# Patient Record
Sex: Male | Born: 1970 | Race: Black or African American | Hispanic: No | Marital: Married | Smoking: Former smoker
Health system: Southern US, Community
[De-identification: ages and names within clinical notes are randomized; demographics above are authoritative.]

## PROBLEM LIST (undated history)

## (undated) DIAGNOSIS — K859 Acute pancreatitis without necrosis or infection, unspecified: Secondary | ICD-10-CM

## (undated) DIAGNOSIS — I10 Essential (primary) hypertension: Secondary | ICD-10-CM

## (undated) HISTORY — PX: NO PAST SURGERIES: SHX2092

---

## 2006-08-20 ENCOUNTER — Emergency Department (HOSPITAL_COMMUNITY): Admission: EM | Admit: 2006-08-20 | Discharge: 2006-08-20 | Payer: Self-pay | Admitting: Emergency Medicine

## 2009-04-13 ENCOUNTER — Emergency Department (HOSPITAL_COMMUNITY): Admission: EM | Admit: 2009-04-13 | Discharge: 2009-04-13 | Payer: Self-pay | Admitting: Emergency Medicine

## 2011-10-27 ENCOUNTER — Encounter: Payer: Self-pay | Admitting: Cardiology

## 2011-10-27 ENCOUNTER — Emergency Department (INDEPENDENT_AMBULATORY_CARE_PROVIDER_SITE_OTHER)
Admission: EM | Admit: 2011-10-27 | Discharge: 2011-10-27 | Disposition: A | Discharge status: Home / Self Care | Payer: Self-pay | Source: Home / Self Care | Attending: Family Medicine | Admitting: Family Medicine

## 2011-10-27 DIAGNOSIS — A64 Unspecified sexually transmitted disease: Secondary | ICD-10-CM

## 2011-10-27 HISTORY — DX: Essential (primary) hypertension: I10

## 2011-10-27 LAB — POCT URINALYSIS DIP (DEVICE)
Ketones, ur: NEGATIVE mg/dL
Leukocytes, UA: NEGATIVE
Protein, ur: 30 mg/dL — AB

## 2011-10-27 MED ORDER — METRONIDAZOLE 500 MG PO TABS
2000.0000 mg | ORAL_TABLET | Freq: Once | ORAL | Status: AC
  Administered 2011-10-27: 2000 mg via ORAL

## 2011-10-27 MED ORDER — METRONIDAZOLE 500 MG PO TABS
ORAL_TABLET | ORAL | Status: AC
  Filled 2011-10-27: qty 1

## 2011-10-27 MED ORDER — METRONIDAZOLE 500 MG PO TABS
ORAL_TABLET | ORAL | Status: AC
  Filled 2011-10-27: qty 3

## 2011-10-27 NOTE — ED Notes (Signed)
Pt possibly exposed to STD trichomonas. Pt denies burning on urination.  Has some itching in the genital area.  Denies penile discharge.

## 2011-10-27 NOTE — ED Provider Notes (Signed)
History     CSN: [id number] Arrival date & time: 10/27/2011  8:40 AM   First MD Initiated Contact with Patient 10/27/11 [phone]      Chief Complaint  Patient presents with  . Exposure to STD    possible tric    (Consider location/radiation/quality/duration/timing/severity/associated sxs/prior treatment) HPI Comments: Max Ayers presents for evaluation and treatment of exposure to trichomonas. He reports that his sexual partner was recently diagnosed and treated for trichomonas. He denies any symptoms, no penile discharge. He denies any abdominal pain or fever. They do not use protection.   Patient is a 40 y.o. male presenting with STD exposure. The history is provided by the patient.  Exposure to STD This is a new problem. The problem occurs constantly.    Past Medical History  Diagnosis Date  . Hypertension     History reviewed. No pertinent past surgical history.  Family History  Problem Relation Age of Onset  . Diabetes Mother   . Hypertension Father     History  Substance Use Topics  . Smoking status: Current Everyday Smoker -- 1.0 packs/day    Types: Cigarettes  . Smokeless tobacco: Not on file  . Alcohol Use: Yes     social      Review of Systems  Constitutional: Negative.   HENT: Negative.   Eyes: Negative.   Respiratory: Negative.   Gastrointestinal: Negative.   Genitourinary: Negative for dysuria, urgency, frequency, hematuria, flank pain and discharge.  Musculoskeletal: Negative.     Allergies  Beeswax  Home Medications  No current outpatient prescriptions on file.  BP 128/81  Pulse 95  Temp(Src) 98.3 F (36.8 C) (Oral)  Resp 20  SpO2 100%  Physical Exam  Constitutional: He is oriented to person, place, and time. He appears well-developed and well-nourished.  HENT:  Head: Normocephalic and atraumatic.  Eyes: EOM are normal.  Neck: Normal range of motion.  Neurological: He is alert and oriented to person, place, and time.  Skin: Skin is  warm and dry.    ED Course  Procedures (including critical care time)  Labs Reviewed - No data to display No results found.   No diagnosis found.    MDM          Richardo Priest, MD 10/27/11 1009

## 2011-10-28 LAB — GC/CHLAMYDIA PROBE AMP, URINE
Chlamydia, Swab/Urine, PCR: NEGATIVE
GC Probe Amp, Urine: NEGATIVE

## 2015-02-23 ENCOUNTER — Emergency Department (HOSPITAL_COMMUNITY)
Admission: EM | Admit: 2015-02-23 | Discharge: 2015-02-23 | Disposition: A | Discharge status: Home / Self Care | Payer: Managed Care, Other (non HMO) | Attending: Emergency Medicine | Admitting: Emergency Medicine

## 2015-02-23 ENCOUNTER — Encounter (HOSPITAL_COMMUNITY): Payer: Self-pay | Admitting: Emergency Medicine

## 2015-02-23 DIAGNOSIS — R21 Rash and other nonspecific skin eruption: Secondary | ICD-10-CM | POA: Diagnosis present

## 2015-02-23 DIAGNOSIS — I10 Essential (primary) hypertension: Secondary | ICD-10-CM | POA: Insufficient documentation

## 2015-02-23 DIAGNOSIS — Z720 Tobacco use: Secondary | ICD-10-CM | POA: Insufficient documentation

## 2015-02-23 DIAGNOSIS — L03114 Cellulitis of left upper limb: Secondary | ICD-10-CM | POA: Diagnosis not present

## 2015-02-23 LAB — CBG MONITORING, ED: Glucose-Capillary: 117 mg/dL — ABNORMAL HIGH (ref 70–99)

## 2015-02-23 MED ORDER — LIDOCAINE HCL 1 % IJ SOLN
INTRAMUSCULAR | Status: AC
  Administered 2015-02-23: 2 mL
  Filled 2015-02-23: qty 20

## 2015-02-23 MED ORDER — CEFTRIAXONE SODIUM 1 G IJ SOLR
1.0000 g | Freq: Once | INTRAMUSCULAR | Status: AC
  Administered 2015-02-23: 1 g via INTRAMUSCULAR
  Filled 2015-02-23: qty 10

## 2015-02-23 MED ORDER — SULFAMETHOXAZOLE-TRIMETHOPRIM 800-160 MG PO TABS: 2.0000 | ORAL_TABLET | Freq: Two times a day (BID) | ORAL | Status: DC

## 2015-02-23 MED ORDER — CEPHALEXIN 500 MG PO CAPS: 500.0000 mg | ORAL_CAPSULE | Freq: Four times a day (QID) | ORAL | Status: DC

## 2015-02-23 NOTE — ED Notes (Signed)
Per pt, states he thought he got bit by bug, noticed raised area on inside of left forearm-now clusters of bumps, raised and red

## 2015-02-23 NOTE — ED Provider Notes (Signed)
CSN: [id number]     Arrival date & time 02/23/15  0815 History   First MD Initiated Contact with Patient 02/23/15 [phone]     Chief Complaint  Patient presents with  . Rash     (Consider location/radiation/quality/duration/timing/severity/associated sxs/prior Treatment) HPI Comments: Patient presents today with a rash to the left forearm.  He states that he may have felt an insect bite him, but he is unsure.  Rash has been present for the past 2 days.  He reports that yesterday he noticed some redness and swelling of the left arm surrounding the rash.  He has not taken anything for symptoms prior to arrival.  He reports that the rash itches and burns.  No drainage from the area.  He denies any new soaps, detergents, lotions, or medications.  He denies fever, chills, nausea, or vomiting.  No SOB.  No swelling of the lips, tongue, or throat.  No history of DM.   Past Medical History  Diagnosis Date  . Hypertension    History reviewed. No pertinent past surgical history. Family History  Problem Relation Age of Onset  . Diabetes Mother   . Hypertension Father    History  Substance Use Topics  . Smoking status: Current Every Day Smoker -- 1.00 packs/day    Types: Cigarettes  . Smokeless tobacco: Not on file  . Alcohol Use: Yes     Comment: social    Review of Systems  All other systems reviewed and are negative.     Allergies  Beeswax  Home Medications   Prior to Admission medications   Not on File   BP 160/90 mmHg  Pulse 99  Temp(Src) 98 F (36.7 C) (Oral)  Resp 18  SpO2 100% Physical Exam  Constitutional: He appears well-developed and well-nourished.  HENT:  Head: Normocephalic and atraumatic.  Mouth/Throat: Oropharynx is clear and moist.  Airway patent No swelling of the lips, tongue, or throat  Neck: Normal range of motion. Neck supple.  Cardiovascular: Normal rate, regular rhythm and normal heart sounds.   Pulses:      Radial pulses are 2+ on the right side,  and 2+ on the left side.  Pulmonary/Chest: Effort normal and breath sounds normal.  Musculoskeletal: Normal range of motion.  Full ROM of the left elbow and left wrist  Neurological: He is alert.  Distal sensation of the left hand intact  Skin: Skin is warm and dry.     Psychiatric: He has a normal mood and affect.  Nursing note and vitals reviewed.   ED Course  Procedures (including critical care time) Labs Review Labs Reviewed  CBG MONITORING, ED    Imaging Review No results found.   EKG Interpretation None      MDM   Final diagnoses:  None   Patient presents today with a rash of the left forearm.  He also has some surrounding erythema, edema, and warmth consistent with Cellulitis.  Patient also evaluated by Dr. Roderic Palau.  Patient given 1 gram Rocephin IM in the ED and discharged home with antibiotics.  No history of DM.  CBG 116 in the ED.  Patient stable for discharge.  Instructed him to come back to the ED or Urgent Care in 2 days for recheck.  Return precautions given.    Hyman Bible, PA-C 02/24/15 2325  Milton Ferguson, MD 02/25/15 [phone]

## 2020-08-16 ENCOUNTER — Other Ambulatory Visit: Payer: Self-pay

## 2020-08-16 ENCOUNTER — Emergency Department (HOSPITAL_BASED_OUTPATIENT_CLINIC_OR_DEPARTMENT_OTHER): Payer: Managed Care, Other (non HMO)

## 2020-08-16 ENCOUNTER — Emergency Department (HOSPITAL_BASED_OUTPATIENT_CLINIC_OR_DEPARTMENT_OTHER)
Admission: EM | Admit: 2020-08-16 | Discharge: 2020-08-17 | Disposition: A | Discharge status: Home / Self Care | Payer: Managed Care, Other (non HMO) | Attending: Emergency Medicine | Admitting: Emergency Medicine

## 2020-08-16 ENCOUNTER — Encounter (HOSPITAL_BASED_OUTPATIENT_CLINIC_OR_DEPARTMENT_OTHER): Payer: Self-pay | Admitting: Emergency Medicine

## 2020-08-16 DIAGNOSIS — Z79899 Other long term (current) drug therapy: Secondary | ICD-10-CM | POA: Insufficient documentation

## 2020-08-16 DIAGNOSIS — R1013 Epigastric pain: Secondary | ICD-10-CM | POA: Insufficient documentation

## 2020-08-16 DIAGNOSIS — F17210 Nicotine dependence, cigarettes, uncomplicated: Secondary | ICD-10-CM | POA: Insufficient documentation

## 2020-08-16 DIAGNOSIS — I10 Essential (primary) hypertension: Secondary | ICD-10-CM | POA: Insufficient documentation

## 2020-08-16 DIAGNOSIS — K859 Acute pancreatitis without necrosis or infection, unspecified: Secondary | ICD-10-CM

## 2020-08-16 LAB — COMPREHENSIVE METABOLIC PANEL
ALT: 17 U/L (ref 0–44)
AST: 22 U/L (ref 15–41)
Albumin: 3.7 g/dL (ref 3.5–5.0)
Alkaline Phosphatase: 63 U/L (ref 38–126)
Anion gap: 11 (ref 5–15)
BUN: 14 mg/dL (ref 6–20)
CO2: 26 mmol/L (ref 22–32)
Calcium: 9 mg/dL (ref 8.9–10.3)
Chloride: 104 mmol/L (ref 98–111)
Creatinine, Ser: 1.02 mg/dL (ref 0.61–1.24)
GFR calc Af Amer: >60 mL/min (ref >60)
GFR calc non Af Amer: >60 mL/min (ref >60)
Glucose, Bld: 103 mg/dL — ABNORMAL HIGH (ref 70–99)
Potassium: 3.5 mmol/L (ref 3.5–5.1)
Sodium: 141 mmol/L (ref 135–145)
Total Bilirubin: 0.5 mg/dL (ref 0.3–1.2)
Total Protein: 7.3 g/dL (ref 6.5–8.1)

## 2020-08-16 LAB — URINALYSIS, ROUTINE W REFLEX MICROSCOPIC
Bilirubin Urine: NEGATIVE
Glucose, UA: NEGATIVE mg/dL
Hgb urine dipstick: NEGATIVE
Ketones, ur: NEGATIVE mg/dL
Leukocytes,Ua: NEGATIVE
Nitrite: NEGATIVE
Protein, ur: NEGATIVE mg/dL
Specific Gravity, Urine: 1.015 (ref 1.005–1.030)
pH: 8 (ref 5.0–8.0)

## 2020-08-16 LAB — CBC
HCT: 43.3 % (ref 39.0–52.0)
Hemoglobin: 14.7 g/dL (ref 13.0–17.0)
MCH: 31.9 pg (ref 26.0–34.0)
MCHC: 33.9 g/dL (ref 30.0–36.0)
MCV: 93.9 fL (ref 80.0–100.0)
Platelets: 147 10*3/uL — ABNORMAL LOW (ref 150–400)
RBC: 4.61 MIL/uL (ref 4.22–5.81)
RDW: 13.2 % (ref 11.5–15.5)
WBC: 5 10*3/uL (ref 4.0–10.5)
nRBC: 0 % (ref 0.0–0.2)

## 2020-08-16 LAB — LIPASE, BLOOD: Lipase: 103 U/L — ABNORMAL HIGH (ref 11–51)

## 2020-08-16 LAB — TROPONIN I (HIGH SENSITIVITY): Troponin I (High Sensitivity): 8 ng/L (ref <18)

## 2020-08-16 MED ORDER — LIDOCAINE VISCOUS HCL 2 % MT SOLN
15.0000 mL | Freq: Once | OROMUCOSAL | Status: AC
  Administered 2020-08-17: 15 mL via ORAL
  Filled 2020-08-16: qty 15

## 2020-08-16 MED ORDER — FENTANYL CITRATE (PF) 100 MCG/2ML IJ SOLN
50.0000 ug | INTRAMUSCULAR | Status: DC | PRN
  Administered 2020-08-16: 50 ug via INTRAVENOUS
  Filled 2020-08-16: qty 2

## 2020-08-16 MED ORDER — ONDANSETRON HCL 4 MG/2ML IJ SOLN
4.0000 mg | Freq: Once | INTRAMUSCULAR | Status: AC | PRN
  Administered 2020-08-16: 4 mg via INTRAVENOUS
  Filled 2020-08-16: qty 2

## 2020-08-16 MED ORDER — ALUM & MAG HYDROXIDE-SIMETH 200-200-20 MG/5ML PO SUSP
30.0000 mL | Freq: Once | ORAL | Status: AC
  Administered 2020-08-17: 30 mL via ORAL
  Filled 2020-08-16: qty 30

## 2020-08-16 MED ORDER — KETOROLAC TROMETHAMINE 30 MG/ML IJ SOLN
30.0000 mg | Freq: Once | INTRAMUSCULAR | Status: AC
  Administered 2020-08-17: 30 mg via INTRAVENOUS
  Filled 2020-08-16: qty 1

## 2020-08-16 MED ORDER — IOHEXOL 300 MG/ML  SOLN
100.0000 mL | Freq: Once | INTRAMUSCULAR | Status: AC | PRN
  Administered 2020-08-16: 100 mL via INTRAVENOUS

## 2020-08-16 NOTE — ED Triage Notes (Signed)
Reports RUQ pain that was radiating across the top of the abdomen and around to upper right back. This started on Thursday.  Now pain is in RUQ and radiating into the back only.  Denies n/v/d. Had an episode with the same thing a  Month ago that went away on its own.  Reports it is worse after eating.

## 2020-08-17 ENCOUNTER — Encounter (HOSPITAL_BASED_OUTPATIENT_CLINIC_OR_DEPARTMENT_OTHER): Payer: Self-pay | Admitting: Emergency Medicine

## 2020-08-17 MED ORDER — OMEPRAZOLE 20 MG PO CPDR: 20.0000 mg | DELAYED_RELEASE_CAPSULE | Freq: Every day | ORAL | 0 refills | Status: DC

## 2020-08-17 MED ORDER — TRAMADOL HCL 50 MG PO TABS: 50.0000 mg | ORAL_TABLET | Freq: Four times a day (QID) | ORAL | 0 refills | Status: DC | PRN

## 2020-08-17 NOTE — ED Provider Notes (Signed)
[facility] EMERGENCY DEPARTMENT Provider Note   CSN: [id number] Arrival date & time: 08/16/20  1825     History Chief Complaint  Patient presents with  . Abdominal Pain    Max Ayers is a 49 y.o. male.  The history is provided by the patient.  Abdominal Pain Pain location:  Epigastric Pain quality: aching   Pain radiation: r flank  Pain severity:  Moderate Onset quality:  Gradual Timing:  Constant Progression:  Unchanged Chronicity:  New Context: not alcohol use   Relieved by:  Nothing Worsened by:  Nothing Ineffective treatments:  None tried Associated symptoms: no anorexia, no chest pain, no fever and no shortness of breath   1 week of epigastric pain, had this in the past and it returned.  No f/c/r. No n/v/d.       Past Medical History:  Diagnosis Date  . Hypertension     There are no problems to display for this patient.   History reviewed. No pertinent surgical history.     Family History  Problem Relation Age of Onset  . Diabetes Mother   . Hypertension Father     Social History   Tobacco Use  . Smoking status: Current Every Day Smoker    Packs/day: 1.00    Types: Cigarettes  . Smokeless tobacco: Never Used  Substance Use Topics  . Alcohol use: Yes    Comment: daily  . Drug use: No    Home Medications Prior to Admission medications   Medication Sig Start Date End Date Taking? Authorizing Provider  cephALEXin (KEFLEX) 500 MG capsule Take 1 capsule (500 mg total) by mouth 4 (four) times daily. 02/23/15   Hyman Bible, PA-C  sulfamethoxazole-trimethoprim (SEPTRA DS) 800-160 MG per tablet Take 2 tablets by mouth every 12 (twelve) hours. 02/23/15   Hyman Bible, PA-C    Allergies    Yellow jacket venom [bee venom] and Beeswax  Review of Systems   Review of Systems  Constitutional: Negative for fever.  HENT: Negative for congestion.   Eyes: Negative for visual disturbance.  Respiratory: Negative for shortness of  breath.   Cardiovascular: Negative for chest pain.  Gastrointestinal: Positive for abdominal pain. Negative for anorexia.  Genitourinary: Negative for difficulty urinating.  Musculoskeletal: Negative for neck pain.  Skin: Negative for rash.  Neurological: Negative for dizziness.  Psychiatric/Behavioral: Negative for agitation.  All other systems reviewed and are negative.   Physical Exam Updated Vital Signs BP (!) 165/81 (BP Location: Right Arm)   Pulse 67   Temp 98.8 F (37.1 C) (Oral)   Resp 20   Ht 5\' 11"  (1.803 m)   Wt 89.8 kg   SpO2 99%   BMI 27.62 kg/m   Physical Exam Vitals and nursing note reviewed.  Constitutional:      General: He is not in acute distress.    Appearance: Normal appearance.  HENT:     Head: Normocephalic and atraumatic.     Nose: Nose normal.  Eyes:     Conjunctiva/sclera: Conjunctivae normal.     Pupils: Pupils are equal, round, and reactive to light.  Cardiovascular:     Rate and Rhythm: Normal rate and regular rhythm.     Pulses: Normal pulses.     Heart sounds: Normal heart sounds.  Pulmonary:     Effort: Pulmonary effort is normal.     Breath sounds: Normal breath sounds.  Abdominal:     General: Abdomen is flat. Bowel sounds are normal.  Palpations: Abdomen is soft.     Tenderness: There is no abdominal tenderness. There is no guarding or rebound.     Hernia: No hernia is present.  Musculoskeletal:        General: Normal range of motion.     Cervical back: Normal range of motion and neck supple.  Skin:    General: Skin is warm and dry.     Capillary Refill: Capillary refill takes less than 2 seconds.  Neurological:     General: No focal deficit present.     Mental Status: He is alert and oriented to person, place, and time.  Psychiatric:        Mood and Affect: Mood normal.     ED Results / Procedures / Treatments   Labs (all labs ordered are listed, but only abnormal results are displayed) Results for orders placed or  performed during the hospital encounter of 08/16/20  Lipase, blood  Result Value Ref Range   Lipase 103 (H) 11 - 51 U/L  Comprehensive metabolic panel  Result Value Ref Range   Sodium 141 135 - 145 mmol/L   Potassium 3.5 3.5 - 5.1 mmol/L   Chloride 104 98 - 111 mmol/L   CO2 26 22 - 32 mmol/L   Glucose, Bld 103 (H) 70 - 99 mg/dL   BUN 14 6 - 20 mg/dL   Creatinine, Ser 1.02 0.61 - 1.24 mg/dL   Calcium 9.0 8.9 - 10.3 mg/dL   Total Protein 7.3 6.5 - 8.1 g/dL   Albumin 3.7 3.5 - 5.0 g/dL   AST 22 15 - 41 U/L   ALT 17 0 - 44 U/L   Alkaline Phosphatase 63 38 - 126 U/L   Total Bilirubin 0.5 0.3 - 1.2 mg/dL   GFR calc non Af Amer >60 >60 mL/min   GFR calc Af Amer >60 >60 mL/min   Anion gap 11 5 - 15  CBC  Result Value Ref Range   WBC 5.0 4.0 - 10.5 K/uL   RBC 4.61 4.22 - 5.81 MIL/uL   Hemoglobin 14.7 13.0 - 17.0 g/dL   HCT 43.3 39 - 52 %   MCV 93.9 80.0 - 100.0 fL   MCH 31.9 26.0 - 34.0 pg   MCHC 33.9 30.0 - 36.0 g/dL   RDW 13.2 11.5 - 15.5 %   Platelets 147 (L) 150 - 400 K/uL   nRBC 0.0 0.0 - 0.2 %  Urinalysis, Routine w reflex microscopic Urine, Clean Catch  Result Value Ref Range   Color, Urine YELLOW YELLOW   APPearance CLEAR CLEAR   Specific Gravity, Urine 1.015 1.005 - 1.030   pH 8.0 5.0 - 8.0   Glucose, UA NEGATIVE NEGATIVE mg/dL   Hgb urine dipstick NEGATIVE NEGATIVE   Bilirubin Urine NEGATIVE NEGATIVE   Ketones, ur NEGATIVE NEGATIVE mg/dL   Protein, ur NEGATIVE NEGATIVE mg/dL   Nitrite NEGATIVE NEGATIVE   Leukocytes,Ua NEGATIVE NEGATIVE  Troponin I (High Sensitivity)  Result Value Ref Range   Troponin I (High Sensitivity) 8 <18 ng/L   CT ABDOMEN PELVIS W CONTRAST  Result Date: 08/16/2020 CLINICAL DATA:  Right upper quadrant pain EXAM: CT ABDOMEN AND PELVIS WITH CONTRAST TECHNIQUE: Multidetector CT imaging of the abdomen and pelvis was performed using the standard protocol following bolus administration of intravenous contrast. CONTRAST:  145mL OMNIPAQUE IOHEXOL  300 MG/ML  SOLN COMPARISON:  None. FINDINGS: Lower chest: Lung bases are clear. No effusions. Heart is normal size. Hepatobiliary: No focal hepatic abnormality. Gallbladder unremarkable.  Pancreas: Elongated cystic area noted in the pancreatic head measuring up to 2 cm. There is stranding noted around the pancreatic tail and extending toward the spleen in the left upper quadrant. No ductal dilatation. Spleen: No focal abnormality.  Normal size. Adrenals/Urinary Tract: No adrenal abnormality. No focal renal abnormality. No stones or hydronephrosis. Urinary bladder is unremarkable. Stomach/Bowel: Normal appendix. Stomach, large and small bowel grossly unremarkable. Vascular/Lymphatic: No evidence of aneurysm or adenopathy. Scattered aortic atherosclerosis. Reproductive: No visible focal abnormality. Other: No free fluid or free air. Musculoskeletal: No acute bony abnormality. IMPRESSION: Stranding around the pancreatic tail. This could reflect early changes of acute pancreatitis. Cystic area within the pancreatic head could reflect pseudo cyst. Electronically Signed   By: Rolm Baptise M.D.   On: 08/16/2020 23:48    EKG EKG Interpretation  Date/Time:  Sunday August 16 2020 18:41:08 EDT Ventricular Rate:  72 PR Interval:  132 QRS Duration: 86 QT Interval:  378 QTC Calculation: 413 R Axis:   84 Text Interpretation: Normal sinus rhythm Confirmed by Randal Buba, Lou Irigoyen ([id number]) on 08/16/2020 11:11:41 PM   Radiology CT ABDOMEN PELVIS W CONTRAST  Result Date: 08/16/2020 CLINICAL DATA:  Right upper quadrant pain EXAM: CT ABDOMEN AND PELVIS WITH CONTRAST TECHNIQUE: Multidetector CT imaging of the abdomen and pelvis was performed using the standard protocol following bolus administration of intravenous contrast. CONTRAST:  129mL OMNIPAQUE IOHEXOL 300 MG/ML  SOLN COMPARISON:  None. FINDINGS: Lower chest: Lung bases are clear. No effusions. Heart is normal size. Hepatobiliary: No focal hepatic abnormality. Gallbladder  unremarkable. Pancreas: Elongated cystic area noted in the pancreatic head measuring up to 2 cm. There is stranding noted around the pancreatic tail and extending toward the spleen in the left upper quadrant. No ductal dilatation. Spleen: No focal abnormality.  Normal size. Adrenals/Urinary Tract: No adrenal abnormality. No focal renal abnormality. No stones or hydronephrosis. Urinary bladder is unremarkable. Stomach/Bowel: Normal appendix. Stomach, large and small bowel grossly unremarkable. Vascular/Lymphatic: No evidence of aneurysm or adenopathy. Scattered aortic atherosclerosis. Reproductive: No visible focal abnormality. Other: No free fluid or free air. Musculoskeletal: No acute bony abnormality. IMPRESSION: Stranding around the pancreatic tail. This could reflect early changes of acute pancreatitis. Cystic area within the pancreatic head could reflect pseudo cyst. Electronically Signed   By: Rolm Baptise M.D.   On: 08/16/2020 23:48    Procedures Procedures (including critical care time)  Medications Ordered in ED Medications  alum & mag hydroxide-simeth (MAALOX/MYLANTA) 200-200-20 MG/5ML suspension 30 mL (has no administration in time range)    And  lidocaine (XYLOCAINE) 2 % viscous mouth solution 15 mL (has no administration in time range)  ketorolac (TORADOL) 30 MG/ML injection 30 mg (has no administration in time range)  ondansetron (ZOFRAN) injection 4 mg (4 mg Intravenous Given 08/16/20 1847)  iohexol (OMNIPAQUE) 300 MG/ML solution 100 mL (100 mLs Intravenous Contrast Given 08/16/20 2325)    ED Course  I have reviewed the triage vital signs and the nursing notes.  Pertinent labs & imaging results that were available during my care of the patient were reviewed by me and considered in my medical decision making (see chart for details).    Ruled out for MI in the ED.  Patient with early pancreatitis.  No greasy spicy food, NO alcohol at all.  Strict abdominal pain return precautions  given.    Max Ayers was evaluated in Emergency Department on 08/17/2020 for the symptoms described in the history of present illness. He was evaluated in the context  of the global COVID-19 pandemic, which necessitated consideration that the patient might be at risk for infection with the SARS-CoV-2 virus that causes COVID-19. Institutional protocols and algorithms that pertain to the evaluation of patients at risk for COVID-19 are in a state of rapid change based on information released by regulatory bodies including the CDC and federal and state organizations. These policies and algorithms were followed during the patient's care in the ED.  Final Clinical Impression(s) / ED Diagnoses  Return for intractable cough, coughing up blood,fevers >100.4 unrelieved by medication, shortness of breath, intractable vomiting, chest pain, shortness of breath, weakness,numbness, changes in speech, facial asymmetry,abdominal pain, passing out,Inability to tolerate liquids or food, cough, altered mental status or any concerns. No signs of systemic illness or infection. The patient is nontoxic-appearing on exam and vital signs are within normal limits.   I have reviewed the triage vital signs and the nursing notes. Pertinent labs &imaging results that were available during my care of the patient were reviewed by me and considered in my medical decision making (see chart for details).After history, exam, and medical workup I feel the patient has beenappropriately medically screened and is safe for discharge home. Pertinent diagnoses were discussed with the patient. Patient was given return precautions.    Sae Handrich, MD 08/17/20 5366

## 2020-08-17 NOTE — Discharge Instructions (Signed)
No alcohol, bland diet.

## 2020-12-12 ENCOUNTER — Inpatient Hospital Stay (HOSPITAL_COMMUNITY)
Admission: EM | Admit: 2020-12-12 | Discharge: 2020-12-15 | DRG: 439 | Disposition: A | Discharge status: Home / Self Care | Payer: Medicaid Other | Attending: Family Medicine | Admitting: Family Medicine

## 2020-12-12 ENCOUNTER — Inpatient Hospital Stay (HOSPITAL_COMMUNITY): Payer: Medicaid Other

## 2020-12-12 ENCOUNTER — Other Ambulatory Visit: Payer: Self-pay

## 2020-12-12 ENCOUNTER — Encounter (HOSPITAL_COMMUNITY): Payer: Self-pay

## 2020-12-12 ENCOUNTER — Emergency Department (HOSPITAL_COMMUNITY): Payer: Medicaid Other

## 2020-12-12 DIAGNOSIS — R1011 Right upper quadrant pain: Secondary | ICD-10-CM

## 2020-12-12 DIAGNOSIS — F17210 Nicotine dependence, cigarettes, uncomplicated: Secondary | ICD-10-CM | POA: Diagnosis present

## 2020-12-12 DIAGNOSIS — Z79899 Other long term (current) drug therapy: Secondary | ICD-10-CM | POA: Diagnosis not present

## 2020-12-12 DIAGNOSIS — I10 Essential (primary) hypertension: Secondary | ICD-10-CM | POA: Diagnosis present

## 2020-12-12 DIAGNOSIS — Z91048 Other nonmedicinal substance allergy status: Secondary | ICD-10-CM | POA: Diagnosis not present

## 2020-12-12 DIAGNOSIS — K863 Pseudocyst of pancreas: Secondary | ICD-10-CM | POA: Diagnosis present

## 2020-12-12 DIAGNOSIS — Z720 Tobacco use: Secondary | ICD-10-CM

## 2020-12-12 DIAGNOSIS — Z20822 Contact with and (suspected) exposure to covid-19: Secondary | ICD-10-CM | POA: Diagnosis present

## 2020-12-12 DIAGNOSIS — K862 Cyst of pancreas: Secondary | ICD-10-CM | POA: Diagnosis present

## 2020-12-12 DIAGNOSIS — F1010 Alcohol abuse, uncomplicated: Secondary | ICD-10-CM | POA: Diagnosis present

## 2020-12-12 DIAGNOSIS — K852 Alcohol induced acute pancreatitis without necrosis or infection: Principal | ICD-10-CM | POA: Diagnosis present

## 2020-12-12 DIAGNOSIS — Z91030 Bee allergy status: Secondary | ICD-10-CM

## 2020-12-12 DIAGNOSIS — K859 Acute pancreatitis without necrosis or infection, unspecified: Secondary | ICD-10-CM | POA: Diagnosis present

## 2020-12-12 DIAGNOSIS — F1011 Alcohol abuse, in remission: Secondary | ICD-10-CM | POA: Diagnosis present

## 2020-12-12 DIAGNOSIS — Z8249 Family history of ischemic heart disease and other diseases of the circulatory system: Secondary | ICD-10-CM

## 2020-12-12 HISTORY — DX: Acute pancreatitis without necrosis or infection, unspecified: K85.90

## 2020-12-12 HISTORY — DX: Tobacco use: Z72.0

## 2020-12-12 LAB — URINALYSIS, ROUTINE W REFLEX MICROSCOPIC
Bacteria, UA: NONE SEEN
Bilirubin Urine: NEGATIVE
Glucose, UA: NEGATIVE mg/dL
Hgb urine dipstick: NEGATIVE
Ketones, ur: 20 mg/dL — AB
Leukocytes,Ua: NEGATIVE
Nitrite: NEGATIVE
Protein, ur: 30 mg/dL — AB
Specific Gravity, Urine: 1.026 (ref 1.005–1.030)
pH: 7 (ref 5.0–8.0)

## 2020-12-12 LAB — COMPREHENSIVE METABOLIC PANEL
ALT: 19 U/L (ref 0–44)
AST: 25 U/L (ref 15–41)
Albumin: 4.1 g/dL (ref 3.5–5.0)
Alkaline Phosphatase: 64 U/L (ref 38–126)
Anion gap: 9 (ref 5–15)
BUN: 10 mg/dL (ref 6–20)
CO2: 22 mmol/L (ref 22–32)
Calcium: 9.3 mg/dL (ref 8.9–10.3)
Chloride: 107 mmol/L (ref 98–111)
Creatinine, Ser: 0.85 mg/dL (ref 0.61–1.24)
GFR, Estimated: >60 mL/min (ref >60)
Glucose, Bld: 106 mg/dL — ABNORMAL HIGH (ref 70–99)
Potassium: 3.9 mmol/L (ref 3.5–5.1)
Sodium: 138 mmol/L (ref 135–145)
Total Bilirubin: 1 mg/dL (ref 0.3–1.2)
Total Protein: 7.7 g/dL (ref 6.5–8.1)

## 2020-12-12 LAB — CBC
HCT: 47.9 % (ref 39.0–52.0)
Hemoglobin: 15.9 g/dL (ref 13.0–17.0)
MCH: 30.4 pg (ref 26.0–34.0)
MCHC: 33.2 g/dL (ref 30.0–36.0)
MCV: 91.6 fL (ref 80.0–100.0)
Platelets: 179 10*3/uL (ref 150–400)
RBC: 5.23 MIL/uL (ref 4.22–5.81)
RDW: 14.6 % (ref 11.5–15.5)
WBC: 5.2 10*3/uL (ref 4.0–10.5)
nRBC: 0 % (ref 0.0–0.2)

## 2020-12-12 LAB — LIPASE, BLOOD: Lipase: 508 U/L — ABNORMAL HIGH (ref 11–51)

## 2020-12-12 LAB — RESP PANEL BY RT-PCR (FLU A&B, COVID) ARPGX2
Influenza A by PCR: NEGATIVE
Influenza B by PCR: NEGATIVE
SARS Coronavirus 2 by RT PCR: NEGATIVE

## 2020-12-12 MED ORDER — SODIUM CHLORIDE 0.9 % IV BOLUS
1000.0000 mL | Freq: Once | INTRAVENOUS | Status: AC
  Administered 2020-12-12: 1000 mL via INTRAVENOUS

## 2020-12-12 MED ORDER — ADULT MULTIVITAMIN W/MINERALS CH
1.0000 | ORAL_TABLET | Freq: Every day | ORAL | Status: DC
  Administered 2020-12-12 – 2020-12-15 (×4): 1 via ORAL
  Filled 2020-12-12 (×4): qty 1

## 2020-12-12 MED ORDER — OXYCODONE HCL 5 MG PO TABS
5.0000 mg | ORAL_TABLET | Freq: Four times a day (QID) | ORAL | Status: DC | PRN
  Administered 2020-12-13 – 2020-12-15 (×6): 5 mg via ORAL
  Filled 2020-12-12 (×6): qty 1

## 2020-12-12 MED ORDER — HYDRALAZINE HCL 25 MG PO TABS
25.0000 mg | ORAL_TABLET | Freq: Four times a day (QID) | ORAL | Status: DC | PRN
Start: 2020-12-12 — End: 2020-12-15
  Administered 2020-12-12 – 2020-12-13 (×2): 25 mg via ORAL
  Filled 2020-12-12 (×2): qty 1

## 2020-12-12 MED ORDER — MORPHINE SULFATE (PF) 4 MG/ML IV SOLN
4.0000 mg | Freq: Once | INTRAVENOUS | Status: AC
  Administered 2020-12-12: 4 mg via INTRAVENOUS
  Filled 2020-12-12: qty 1

## 2020-12-12 MED ORDER — THIAMINE HCL 100 MG PO TABS
100.0000 mg | ORAL_TABLET | Freq: Every day | ORAL | Status: DC
  Administered 2020-12-12 – 2020-12-15 (×4): 100 mg via ORAL
  Filled 2020-12-12 (×4): qty 1

## 2020-12-12 MED ORDER — HYDROMORPHONE HCL 1 MG/ML IJ SOLN
0.5000 mg | INTRAMUSCULAR | Status: AC | PRN
  Administered 2020-12-12 – 2020-12-13 (×2): 0.5 mg via INTRAVENOUS
  Filled 2020-12-12 (×2): qty 0.5

## 2020-12-12 MED ORDER — HYDROMORPHONE HCL 1 MG/ML IJ SOLN
1.0000 mg | Freq: Once | INTRAMUSCULAR | Status: AC
  Administered 2020-12-12: 1 mg via INTRAVENOUS
  Filled 2020-12-12: qty 1

## 2020-12-12 MED ORDER — IOHEXOL 300 MG/ML  SOLN
100.0000 mL | Freq: Once | INTRAMUSCULAR | Status: AC | PRN
  Administered 2020-12-12: 100 mL via INTRAVENOUS

## 2020-12-12 MED ORDER — ONDANSETRON HCL 4 MG PO TABS: 4.0000 mg | ORAL_TABLET | Freq: Four times a day (QID) | ORAL | Status: DC | PRN

## 2020-12-12 MED ORDER — ALBUTEROL SULFATE (2.5 MG/3ML) 0.083% IN NEBU: 2.5000 mg | INHALATION_SOLUTION | Freq: Four times a day (QID) | RESPIRATORY_TRACT | Status: DC | PRN

## 2020-12-12 MED ORDER — ONDANSETRON HCL 4 MG/2ML IJ SOLN: 4.0000 mg | Freq: Four times a day (QID) | INTRAMUSCULAR | Status: DC | PRN

## 2020-12-12 MED ORDER — LORAZEPAM 2 MG/ML IJ SOLN: 1.0000 mg | INTRAMUSCULAR | Status: DC | PRN

## 2020-12-12 MED ORDER — LORAZEPAM 1 MG PO TABS: 1.0000 mg | ORAL_TABLET | ORAL | Status: DC | PRN

## 2020-12-12 MED ORDER — SODIUM CHLORIDE 0.9% FLUSH
3.0000 mL | Freq: Two times a day (BID) | INTRAVENOUS | Status: DC
  Administered 2020-12-12 – 2020-12-14 (×3): 3 mL via INTRAVENOUS

## 2020-12-12 MED ORDER — ONDANSETRON HCL 4 MG/2ML IJ SOLN
4.0000 mg | Freq: Once | INTRAMUSCULAR | Status: AC
  Administered 2020-12-12: 4 mg via INTRAVENOUS
  Filled 2020-12-12: qty 2

## 2020-12-12 MED ORDER — THIAMINE HCL 100 MG/ML IJ SOLN: 100.0000 mg | Freq: Every day | INTRAMUSCULAR | Status: DC

## 2020-12-12 MED ORDER — ENOXAPARIN SODIUM 40 MG/0.4ML ~~LOC~~ SOLN
40.0000 mg | SUBCUTANEOUS | Status: DC
  Administered 2020-12-12 – 2020-12-14 (×3): 40 mg via SUBCUTANEOUS
  Filled 2020-12-12 (×3): qty 0.4

## 2020-12-12 MED ORDER — LACTATED RINGERS IV SOLN: INTRAVENOUS | Status: DC

## 2020-12-12 MED ORDER — FOLIC ACID 1 MG PO TABS
1.0000 mg | ORAL_TABLET | Freq: Every day | ORAL | Status: DC
  Administered 2020-12-12 – 2020-12-15 (×4): 1 mg via ORAL
  Filled 2020-12-12 (×4): qty 1

## 2020-12-12 NOTE — ED Provider Notes (Signed)
[facility] EMERGENCY DEPARTMENT Provider Note   CSN: [id number] Arrival date & time: 12/12/20  3151     History No chief complaint on file.   Max Ayers is a 50 y.o. male.  The history is provided by the patient and medical records.   Max Ayers is a 50 y.o. male who presents to the Emergency Department complaining of abdominal pain. He presents the emergency department complaining of severe abdominal pain that began last night. Initially pain was sharp and located in the right upper quadrant. Later it radiated throughout his entire abdomen. He has associated nausea and vomiting. He denies any fevers, shortness of breath, diarrhea. He has no known medical problems. He did have an episode of pancreatitis in September but this feels slightly different. He drinks occasional alcohol. He is a former smoker. No drug use. He was unable to sleep secondary to pain.    Past Medical History:  Diagnosis Date  . Hypertension     There are no problems to display for this patient.   History reviewed. No pertinent surgical history.     Family History  Problem Relation Age of Onset  . Diabetes Mother   . Hypertension Father     Social History   Tobacco Use  . Smoking status: Current Every Day Smoker    Packs/day: 1.00    Types: Cigarettes  . Smokeless tobacco: Never Used  Substance Use Topics  . Alcohol use: Yes    Comment: daily  . Drug use: No    Home Medications Prior to Admission medications   Medication Sig Start Date End Date Taking? Authorizing Provider  Multiple Vitamin (MULTIVITAMIN) tablet Take 1 tablet by mouth daily.   Yes [provider]  EPINEPHrine 0.3 mg/0.3 mL IJ SOAJ injection Inject 0.3 mg into the muscle daily as needed for anaphylaxis (call 911). 06/11/19   [provider]  omeprazole (PRILOSEC) 20 MG capsule Take 1 capsule (20 mg total) by mouth daily. Patient not taking: No sig reported 08/17/20   Palumbo, April, MD     Allergies    Beeswax and Yellow jacket venom [bee venom]  Review of Systems   Review of Systems  All other systems reviewed and are negative.   Physical Exam Updated Vital Signs BP (!) 169/75   Pulse 69   Temp 98.9 F (37.2 C) (Oral)   Resp 16   Ht 6\' 2"  (1.88 m)   Wt 86.2 kg   SpO2 98%   BMI 24.39 kg/m   Physical Exam Vitals and nursing note reviewed.  Constitutional:      Appearance: He is well-developed and well-nourished.  HENT:     Head: Normocephalic and atraumatic.  Cardiovascular:     Rate and Rhythm: Normal rate and regular rhythm.     Heart sounds: No murmur heard.   Pulmonary:     Effort: Pulmonary effort is normal. No respiratory distress.     Breath sounds: Normal breath sounds.  Abdominal:     Palpations: Abdomen is soft.     Tenderness: There is no guarding or rebound.     Comments: Moderate generalized abdominal tenderness, greatest over RUQ  Musculoskeletal:        General: No tenderness or edema.  Skin:    General: Skin is warm and dry.  Neurological:     Mental Status: He is alert and oriented to person, place, and time.  Psychiatric:        Mood and Affect: Mood and  affect normal.        Behavior: Behavior normal.     ED Results / Procedures / Treatments   Labs (all labs ordered are listed, but only abnormal results are displayed) Labs Reviewed  LIPASE, BLOOD - Abnormal; Notable for the following components:      Result Value   Lipase 508 (*)    All other components within normal limits  COMPREHENSIVE METABOLIC PANEL - Abnormal; Notable for the following components:   Glucose, Bld 106 (*)    All other components within normal limits  URINALYSIS, ROUTINE W REFLEX MICROSCOPIC - Abnormal; Notable for the following components:   APPearance HAZY (*)    Ketones, ur 20 (*)    Protein, ur 30 (*)    All other components within normal limits  RESP PANEL BY RT-PCR (FLU A&B, COVID) ARPGX2  CBC    EKG None  Radiology US Abdomen  Limited RUQ (LIVER/GB)  Result Date: 12/12/2020 CLINICAL DATA:  Right upper quadrant pain EXAM: ULTRASOUND ABDOMEN LIMITED RIGHT UPPER QUADRANT COMPARISON:  CT 08/16/2020 FINDINGS: Gallbladder: No gallstones or wall thickening visualized. No sonographic Murphy sign noted by sonographer. Common bile duct: Diameter: Normal caliber, 4 mm. Liver: No focal lesion identified. Within normal limits in parenchymal echogenicity. Portal vein is patent on color Doppler imaging with normal direction of blood flow towards the liver. Other: None. IMPRESSION: Unremarkable right upper quadrant ultrasound. Electronically Signed   By: Rolm Baptise M.D.   On: 12/12/2020 13:03    Procedures Procedures (including critical care time)  Medications Ordered in ED Medications  morphine 4 MG/ML injection 4 mg (4 mg Intravenous Given 12/12/20 1202)  ondansetron (ZOFRAN) injection 4 mg (4 mg Intravenous Given 12/12/20 1202)  sodium chloride 0.9 % bolus 1,000 mL (1,000 mLs Intravenous New Bag/Given 12/12/20 1203)  HYDROmorphone (DILAUDID) injection 1 mg (1 mg Intravenous Given 12/12/20 1502)    ED Course  I have reviewed the triage vital signs and the nursing notes.  Pertinent labs & imaging results that were available during my care of the patient were reviewed by me and considered in my medical decision making (see chart for details).    MDM Rules/Calculators/A&P                         Patient here for generalized abdominal pain, started with right upper quadrant pain. He has tenderness on examination without peritoneal findings. Lipase is elevated at 500. Right upper quadrant is negative for gallstones were obstruction. Discussed with patient diagnosis of pancreatitis. He is ongoing pain in the emergency department. Plan to admit for ongoing treatment and workup. Patient is in agreement with treatment plan. Hospitalist consulted for admission.  Final Clinical Impression(s) / ED Diagnoses Final diagnoses:  RUQ pain  Acute  pancreatitis without infection or necrosis, unspecified pancreatitis type    Rx / DC Orders ED Discharge Orders    None       Quintella Reichert, MD 12/12/20 1609

## 2020-12-12 NOTE — ED Notes (Signed)
Report attempted 

## 2020-12-12 NOTE — ED Notes (Signed)
Patient transported to CT 

## 2020-12-12 NOTE — H&P (Signed)
History and Physical    Max Ayers HYW:[id number] DOB: [birth year] DOA: 12/12/2020  Referring MD/NP/PA: Tilden Fossa, MD PCP: No Pcp, Per Patient (Inactive)  Patient coming from: Home  Chief Complaint: Abdominal pain  I have personally briefly reviewed patient's old medical records in [facility]   HPI: Max Ayers is a 50 y.o. male with medical history significant of hypertension, pancreatitis, and tobacco abuse who presents with complaints of abdominal pain starting yesterday morning at around 9 AM.  Initially symptoms started in the right upper quadrant, but after eating breakfast moved across his abdomen and then spread all over.  He describes it as a severe cramping pain.  Noted associated symptoms of nausea.  Tried taking Advil without relief in symptoms.  Patient had stopped smoking tobacco and drinking alcohol in September of this year after he was seen in the emergency department with pancreatitis.  Over the holidays he had.  He started drinking and smoking tobacco.  Reports that he may have overdone it on Christmas with his alcohol use.  Patient reports that his last drink was on Christmas day denies any signs of withdrawal  ED Course: Upon admission into the emergency department patient was seen to be afebrile with blood pressures elevated up to 174/84 and all other vital signs maintained.  Labs significant for lipase 508.  Right upper abdominal ultrasound did not show any acute abnormalities.  COVID-19 screening was negative.  Patient was given 1 L of normal saline IV fluids, 4 mg of morphine, Dilaudid, and Zofran.  TRH called to.  TRH called to admit  Review of Systems  Constitutional: Positive for malaise/fatigue. Negative for fever.  HENT: Negative for congestion and ear discharge.   Eyes: Negative for photophobia and pain.  Respiratory: Negative for hemoptysis and shortness of breath.   Cardiovascular: Negative for chest pain and leg swelling.  Gastrointestinal:  Positive for abdominal pain and nausea. Negative for diarrhea and vomiting.  Genitourinary: Negative for dysuria and hematuria.  Musculoskeletal: Negative for myalgias.  Skin: Negative for itching.  Neurological: Negative for loss of consciousness and weakness.  Psychiatric/Behavioral: Positive for substance abuse. Negative for depression.    Past Medical History:  Diagnosis Date  . Hypertension     History reviewed. No pertinent surgical history.   reports that he has been smoking cigarettes. He has been smoking about 1.00 pack per day. He has never used smokeless tobacco. He reports current alcohol use. He reports that he does not use drugs.  Allergies  Allergen Reactions  . Beeswax Hives and Swelling    Allergic to bees  . [place] Venom [Bee Venom] Anaphylaxis    Family History  Problem Relation Age of Onset  . Diabetes Mother   . Hypertension Father     Prior to Admission medications   Medication Sig Start Date End Date Taking? Authorizing Provider  Multiple Vitamin (MULTIVITAMIN) tablet Take 1 tablet by mouth daily.   Yes [provider]  EPINEPHrine 0.3 mg/0.3 mL IJ SOAJ injection Inject 0.3 mg into the muscle daily as needed for anaphylaxis (call 911). 06/11/19   [provider]  omeprazole (PRILOSEC) 20 MG capsule Take 1 capsule (20 mg total) by mouth daily. Patient not taking: No sig reported 08/17/20   Palumbo, April, MD    Physical Exam:  Constitutional: Middle-age male who appears to be in some moderate discomfort Vitals:   12/12/20 0904 12/12/20 1420  BP: (!) 174/84 (!) 169/75  Pulse: 71 69  Resp: 18 16  Temp: 98.3 F (36.8 C) 98.9 F (37.2 C)  TempSrc: Oral Oral  SpO2: 98% 98%  Weight: 86.2 kg   Height: 6\' 2"  (1.88 m)    Eyes: PERRL, lids and conjunctivae normal ENMT: Mucous membranes are moist. Posterior pharynx clear of any exudate or lesions.Normal dentition.  Neck: normal, supple, no masses, no thyromegaly Respiratory:  clear to auscultation bilaterally, no wheezing, no crackles. Normal respiratory effort. No accessory muscle use.  Cardiovascular: Regular rate and rhythm, no murmurs / rubs / gallops. No extremity edema. 2+ pedal pulses. No carotid bruits.  Abdomen: Epigastric tenderness appreciated with normal bowel sounds. Musculoskeletal: no clubbing / cyanosis. No joint deformity upper and lower extremities. Good ROM, no contractures. Normal muscle tone.  Skin: no rashes, lesions, ulcers. No induration Neurologic: CN 2-12 grossly intact. Sensation intact, DTR normal. Strength 5/5 in all 4.  Psychiatric: Normal judgment and insight. Alert and oriented x 3. Normal mood.     Labs on Admission: I have personally reviewed following labs and imaging studies  CBC: Recent Labs  Lab 12/12/20 0920  WBC 5.2  HGB 15.9  HCT 47.9  MCV 91.6  PLT 179   Basic Metabolic Panel: Recent Labs  Lab 12/12/20 0920  NA 138  K 3.9  CL 107  CO2 22  GLUCOSE 106*  BUN 10  CREATININE 0.85  CALCIUM 9.3   GFR: Estimated Creatinine Clearance: 123.6 mL/min (by C-G formula based on SCr of 0.85 mg/dL). Liver Function Tests: Recent Labs  Lab 12/12/20 0920  AST 25  ALT 19  ALKPHOS 64  BILITOT 1.0  PROT 7.7  ALBUMIN 4.1   Recent Labs  Lab 12/12/20 0920  LIPASE 508*   No results for input(s): AMMONIA in the last 168 hours. Coagulation Profile: No results for input(s): INR, PROTIME in the last 168 hours. Cardiac Enzymes: No results for input(s): CKTOTAL, CKMB, CKMBINDEX, TROPONINI in the last 168 hours. BNP (last 3 results) No results for input(s): PROBNP in the last 8760 hours. HbA1C: No results for input(s): HGBA1C in the last 72 hours. CBG: No results for input(s): GLUCAP in the last 168 hours. Lipid Profile: No results for input(s): CHOL, HDL, LDLCALC, TRIG, CHOLHDL, LDLDIRECT in the last 72 hours. Thyroid Function Tests: No results for input(s): TSH, T4TOTAL, FREET4, T3FREE, THYROIDAB in the last 72  hours. Anemia Panel: No results for input(s): VITAMINB12, FOLATE, FERRITIN, TIBC, IRON, RETICCTPCT in the last 72 hours. Urine analysis:    Component Value Date/Time   COLORURINE YELLOW 12/12/2020 1044   APPEARANCEUR HAZY (A) 12/12/2020 1044   LABSPEC 1.026 12/12/2020 1044   PHURINE 7.0 12/12/2020 1044   GLUCOSEU NEGATIVE 12/12/2020 1044   HGBUR NEGATIVE 12/12/2020 1044   BILIRUBINUR NEGATIVE 12/12/2020 1044   KETONESUR 20 (A) 12/12/2020 1044   PROTEINUR 30 (A) 12/12/2020 1044   UROBILINOGEN 0.2 10/27/2011 0957   NITRITE NEGATIVE 12/12/2020 1044   LEUKOCYTESUR NEGATIVE 12/12/2020 1044   Sepsis Labs: Recent Results (from the past 240 hour(s))  Resp Panel by RT-PCR (Flu A&B, Covid) Nasopharyngeal Swab     Status: None   Collection Time: 12/12/20  2:30 PM   Specimen: Nasopharyngeal Swab; Nasopharyngeal(NP) swabs in vial transport medium  Result Value Ref Range Status   SARS Coronavirus 2 by RT PCR NEGATIVE NEGATIVE Final    Comment: (NOTE) SARS-CoV-2 target nucleic acids are NOT DETECTED.  The SARS-CoV-2 RNA is generally detectable in upper respiratory specimens during the acute phase of infection. The lowest concentration of SARS-CoV-2 viral copies this  assay can detect is 138 copies/mL. A negative result does not preclude SARS-Cov-2 infection and should not be used as the sole basis for treatment or other patient management decisions. A negative result may occur with  improper specimen collection/handling, submission of specimen other than nasopharyngeal swab, presence of viral mutation(s) within the areas targeted by this assay, and inadequate number of viral copies(<138 copies/mL). A negative result must be combined with clinical observations, patient history, and epidemiological information. The expected result is Negative.  Fact Sheet for Patients:  EntrepreneurPulse.com.au  Fact Sheet for Healthcare Providers:   IncredibleEmployment.be  This test is no t yet approved or cleared by the [place] FDA and  has been authorized for detection and/or diagnosis of SARS-CoV-2 by FDA under an Emergency Use Authorization (EUA). This EUA will remain  in effect (meaning this test can be used) for the duration of the COVID-19 declaration under Section 564(b)(1) of the Act, 21 U.S.C.section 360bbb-3(b)(1), unless the authorization is terminated  or revoked sooner.       Influenza A by PCR NEGATIVE NEGATIVE Final   Influenza B by PCR NEGATIVE NEGATIVE Final    Comment: (NOTE) The Xpert Xpress SARS-CoV-2/FLU/RSV plus assay is intended as an aid in the diagnosis of influenza from Nasopharyngeal swab specimens and should not be used as a sole basis for treatment. Nasal washings and aspirates are unacceptable for Xpert Xpress SARS-CoV-2/FLU/RSV testing.  Fact Sheet for Patients: EntrepreneurPulse.com.au  Fact Sheet for Healthcare Providers: IncredibleEmployment.be  This test is not yet approved or cleared by the [place] FDA and has been authorized for detection and/or diagnosis of SARS-CoV-2 by FDA under an Emergency Use Authorization (EUA). This EUA will remain in effect (meaning this test can be used) for the duration of the COVID-19 declaration under Section 564(b)(1) of the Act, 21 U.S.C. section 360bbb-3(b)(1), unless the authorization is terminated or revoked.  Performed at [facility], [place] [address], [place],  [address]      Radiological Exams on Admission: US Abdomen Limited RUQ (LIVER/GB)  Result Date: 12/12/2020 CLINICAL DATA:  Right upper quadrant pain EXAM: ULTRASOUND ABDOMEN LIMITED RIGHT UPPER QUADRANT COMPARISON:  CT 08/16/2020 FINDINGS: Gallbladder: No gallstones or wall thickening visualized. No sonographic Murphy sign noted by sonographer. Common bile duct: Diameter: Normal caliber, 4 mm. Liver: No focal  lesion identified. Within normal limits in parenchymal echogenicity. Portal vein is patent on color Doppler imaging with normal direction of blood flow towards the liver. Other: None. IMPRESSION: Unremarkable right upper quadrant ultrasound. Electronically Signed   By: Rolm Baptise M.D.   On: 12/12/2020 13:03    EKG: Independently reviewed.   Assessment/Plan Pancreatitis: Acute.  Patient presents with complaints of severe crampy abdominal pain.  Lipase found to be elevated at 508.  Right upper quadrant ultrasound did not show any acute abnormalities.  Patient had been given 1 L normal saline IV fluids and pain medication.  Patient has had pancreatitis before in September imaging studies at that time showed concern for possible pseudocyst -Admit to medical telemetry bed -Check lipid panel -Advance diet as tolerated -Normal saline IV fluids at 150 mL/h -CT scan of the abdomen and pelvis with contrast -Oxycodone/Dilaudid IV as needed for moderate and severe pain respectively with limits on the Dilaudid -Incentive spirometry  Essential hypertension: Patient's blood pressures elevated up to 174/84 on admission.  He does not appear to be on any blood pressure medicines at home. -Hydralazine p.o. as needed for systolic blood pressures greater than 180  Alcohol abuse: Patient reports that his last drink was 1 week ago.  Denies any withdrawal-like symptoms. -CIWA protocols -Continue counseling on the need of cessation of alcohol abuse  Tobacco use: Patient reports currently smoking lactitol cigarettes.  Denies need of nicotine patch -Continue counseling on the need of cessation of alcohol abuse  DVT prophylaxis: Lovenox Code Status: Full Family Communication: Wife updated over the phone Disposition Plan: Possible discharge home in 1 to 2 days Consults called: None Admission status: inpatient   Norval Morton MD [facility]   If 7PM-7AM, please contact night-coverage   12/12/2020,  4:03 PM

## 2020-12-12 NOTE — ED Notes (Signed)
Report attempted, RN to call back. 

## 2020-12-12 NOTE — ED Triage Notes (Signed)
Patient complains of 1 day of abdominal pain with radiation through to back. Nausea and 1 episode of vomiting with same.

## 2020-12-13 DIAGNOSIS — F1010 Alcohol abuse, uncomplicated: Secondary | ICD-10-CM

## 2020-12-13 DIAGNOSIS — I10 Essential (primary) hypertension: Secondary | ICD-10-CM

## 2020-12-13 DIAGNOSIS — K859 Acute pancreatitis without necrosis or infection, unspecified: Secondary | ICD-10-CM

## 2020-12-13 DIAGNOSIS — Z720 Tobacco use: Secondary | ICD-10-CM

## 2020-12-13 LAB — CBC
HCT: 43.5 % (ref 39.0–52.0)
Hemoglobin: 14.7 g/dL (ref 13.0–17.0)
MCH: 30.6 pg (ref 26.0–34.0)
MCHC: 33.8 g/dL (ref 30.0–36.0)
MCV: 90.4 fL (ref 80.0–100.0)
Platelets: 166 10*3/uL (ref 150–400)
RBC: 4.81 MIL/uL (ref 4.22–5.81)
RDW: 14.6 % (ref 11.5–15.5)
WBC: 6.2 10*3/uL (ref 4.0–10.5)
nRBC: 0 % (ref 0.0–0.2)

## 2020-12-13 LAB — COMPREHENSIVE METABOLIC PANEL
ALT: 19 U/L (ref 0–44)
AST: 21 U/L (ref 15–41)
Albumin: 3.6 g/dL (ref 3.5–5.0)
Alkaline Phosphatase: 58 U/L (ref 38–126)
Anion gap: 7 (ref 5–15)
BUN: 8 mg/dL (ref 6–20)
CO2: 24 mmol/L (ref 22–32)
Calcium: 9.1 mg/dL (ref 8.9–10.3)
Chloride: 106 mmol/L (ref 98–111)
Creatinine, Ser: 0.83 mg/dL (ref 0.61–1.24)
GFR, Estimated: >60 mL/min (ref >60)
Glucose, Bld: 95 mg/dL (ref 70–99)
Potassium: 3.6 mmol/L (ref 3.5–5.1)
Sodium: 137 mmol/L (ref 135–145)
Total Bilirubin: 0.9 mg/dL (ref 0.3–1.2)
Total Protein: 7 g/dL (ref 6.5–8.1)

## 2020-12-13 LAB — LIPID PANEL
Cholesterol: 190 mg/dL (ref 0–200)
HDL: 33 mg/dL — ABNORMAL LOW (ref >40)
LDL Cholesterol: 140 mg/dL — ABNORMAL HIGH (ref 0–99)
Total CHOL/HDL Ratio: 5.8 RATIO
Triglycerides: 86 mg/dL (ref <150)
VLDL: 17 mg/dL (ref 0–40)

## 2020-12-13 LAB — HIV ANTIBODY (ROUTINE TESTING W REFLEX): HIV Screen 4th Generation wRfx: NONREACTIVE

## 2020-12-13 LAB — PHOSPHORUS: Phosphorus: 2.9 mg/dL (ref 2.5–4.6)

## 2020-12-13 LAB — SALICYLATE LEVEL: Salicylate Lvl: <7 mg/dL — ABNORMAL LOW (ref 7.0–30.0)

## 2020-12-13 LAB — MAGNESIUM: Magnesium: 2.1 mg/dL (ref 1.7–2.4)

## 2020-12-13 LAB — ACETAMINOPHEN LEVEL: Acetaminophen (Tylenol), Serum: <10 ug/mL — ABNORMAL LOW (ref 10–30)

## 2020-12-13 MED ORDER — ACETAMINOPHEN 325 MG PO TABS
650.0000 mg | ORAL_TABLET | Freq: Four times a day (QID) | ORAL | Status: DC | PRN
  Administered 2020-12-13: 650 mg via ORAL
  Filled 2020-12-13: qty 2

## 2020-12-13 MED ORDER — AMLODIPINE BESYLATE 5 MG PO TABS
5.0000 mg | ORAL_TABLET | Freq: Every day | ORAL | Status: DC
  Administered 2020-12-13 – 2020-12-15 (×3): 5 mg via ORAL
  Filled 2020-12-13 (×2): qty 1

## 2020-12-13 MED ORDER — COVID-19 MRNA VACCINE (PFIZER) 30 MCG/0.3ML IM SUSP
0.3000 mL | Freq: Once | INTRAMUSCULAR | Status: AC
  Administered 2020-12-14: 0.3 mL via INTRAMUSCULAR
  Filled 2020-12-13: qty 0.3

## 2020-12-13 NOTE — Progress Notes (Signed)
   12/13/20 0145  Assess: MEWS Score  Level of Consciousness Alert  Assess: MEWS Score  MEWS Temp 0  MEWS Systolic 2  MEWS Pulse 0  MEWS RR 0  MEWS LOC 0  MEWS Score 2  MEWS Score Color Yellow  Assess: if the MEWS score is Yellow or Red  Were vital signs taken at a resting state? Yes  Focused Assessment No change from prior assessment  Early Detection of Sepsis Score *See Row Information* Low  MEWS guidelines implemented *See Row Information* Yes  Take Vital Signs  Increase Vital Sign Frequency  Yellow: Q 2hr X 2 then Q 4hr X 2, if remains yellow, continue Q 4hrs  Escalate  MEWS: Escalate Yellow: discuss with charge nurse/RN and consider discussing with provider and RRT  Notify: Charge Nurse/RN  Name of Charge Nurse/RN Notified Lyn RN  Date Charge Nurse/RN Notified 12/13/20  Time Charge Nurse/RN Notified 0155  Document  Patient Outcome  (Pt remians stable)  Progress note created (see row info) Yes

## 2020-12-13 NOTE — Progress Notes (Signed)
PROGRESS NOTE  Max Ayers [id number] DOB: 1971-06-28 DOA: 12/12/2020 PCP: No Pcp, Per Patient (Inactive)  Brief History:  50 year old male with a history of hypertension, tobacco abuse, alcohol dependence, and pancreatitis presenting with abdominal pain with associated nausea and vomiting that began on 12/11/2020.  The patient had an episode of pancreatitis in September 2021 secondary to alcohol use.  He stated that he quit drinking alcohol and stop smoking tobacco after that episode.  However, the patient restarted drinking alcohol around " the holidays".  He states that he has been drinking a few beers and whiskey every other day.  He denies any illicit drug use.  He denies any new medications or prescription medications for that matter.  On 12/11/2020, he began experiencing epigastric pain that subsequently migrated to the rest of his abdomen.  Because of his nausea and vomiting and pain he presented for further evaluation.  He denied any fevers, chills, chest pain, shortness breath, coughing, hemoptysis, hematochezia, melena, diarrhea. In the emergency department, CT of the abdomen and pelvis showed peripancreatic fat stranding and trace fluid in the pancreatic body and proximal tail.  Lipase was 508.  The patient was admitted for pancreatitis and started on intravenous fluids.  Assessment/Plan: Acute alcoholic pancreatitis with pseudocyst -The patient experienced increasing pain with a cardiac diet -De-escalate diet to clear liquids -Continue LR -Judicious opioids -Triglycerides 86 -RUQ US--neg -12/12/20--CT abd--also showed an elevated cystic area in the peripancreatic measuring 2.1 x 1.1 cm which is stable when compared to prior CT  Essential hypertension -Start amlodipine  Alcohol abuse -Cessation discussed -Alcohol trial protocol  Tobacco abuse -Cessation discussed     Status is: Inpatient  Remains inpatient appropriate because:IV treatments appropriate due to  intensity of illness or inability to take PO   Dispo: The patient is from: Home              Anticipated d/c is to: Home              Anticipated d/c date is: 2 days              Patient currently is not medically stable to d/c.        Family Communication:  no Family at bedside  Consultants:  none  Code Status:  FULL   DVT Prophylaxis:   [place] Lovenox   Procedures: As Listed in Progress Note Above  Antibiotics: None       Subjective: Patient complain of worsen abd pain after breakfast.  Denies f/c, cp, sob, nv/d, dysuria  Objective: Vitals:   12/13/20 0133 12/13/20 0410 12/13/20 0500 12/13/20 0813  BP: (!) 208/86 (!) 198/81 (!) 204/86 (!) 149/84  Pulse: 83 66  75  Resp: 16 14  18   Temp: 98.6 F (37 C) 99 F (37.2 C)  98.4 F (36.9 C)  TempSrc: Oral Oral  Oral  SpO2: 99% 97%  99%  Weight:      Height:        Intake/Output Summary (Last 24 hours) at 12/13/2020 1134 Last data filed at 12/13/2020 0920 Gross per 24 hour  Intake 2198.64 ml  Output 1 ml  Net 2197.64 ml   Weight change:  Exam:   General:  Pt is alert, follows commands appropriately, not in acute distress  HEENT: No icterus, No thrush, No neck mass, [place]/AT  Cardiovascular: RRR, S1/S2, no rubs, no gallops  Respiratory: CTA bilaterally, no wheezing, no crackles, no rhonchi  Abdomen:  Soft/+BS, epigastric tender, non distended, no guarding  Extremities: No edema, No lymphangitis, No petechiae, No rashes, no synovitis   Data Reviewed: I have personally reviewed following labs and imaging studies Basic Metabolic Panel: Recent Labs  Lab 12/12/20 0920 12/13/20 0031  NA 138 137  K 3.9 3.6  CL 107 106  CO2 22 24  GLUCOSE 106* 95  BUN 10 8  CREATININE 0.85 0.83  CALCIUM 9.3 9.1  MG  --  2.1  PHOS  --  2.9   Liver Function Tests: Recent Labs  Lab 12/12/20 0920 12/13/20 0031  AST 25 21  ALT 19 19  ALKPHOS 64 58  BILITOT 1.0 0.9  PROT 7.7 7.0  ALBUMIN 4.1 3.6   Recent Labs   Lab 12/12/20 0920  LIPASE 508*   No results for input(s): AMMONIA in the last 168 hours. Coagulation Profile: No results for input(s): INR, PROTIME in the last 168 hours. CBC: Recent Labs  Lab 12/12/20 0920 12/13/20 0031  WBC 5.2 6.2  HGB 15.9 14.7  HCT 47.9 43.5  MCV 91.6 90.4  PLT 179 166   Cardiac Enzymes: No results for input(s): CKTOTAL, CKMB, CKMBINDEX, TROPONINI in the last 168 hours. BNP: Invalid input(s): POCBNP CBG: No results for input(s): GLUCAP in the last 168 hours. HbA1C: No results for input(s): HGBA1C in the last 72 hours. Urine analysis:    Component Value Date/Time   COLORURINE YELLOW 12/12/2020 1044   APPEARANCEUR HAZY (A) 12/12/2020 1044   LABSPEC 1.026 12/12/2020 1044   PHURINE 7.0 12/12/2020 1044   GLUCOSEU NEGATIVE 12/12/2020 1044   HGBUR NEGATIVE 12/12/2020 1044   BILIRUBINUR NEGATIVE 12/12/2020 1044   KETONESUR 20 (A) 12/12/2020 1044   PROTEINUR 30 (A) 12/12/2020 1044   UROBILINOGEN 0.2 10/27/2011 0957   NITRITE NEGATIVE 12/12/2020 1044   LEUKOCYTESUR NEGATIVE 12/12/2020 1044   Sepsis Labs: @LABRCNTIP (procalcitonin:4,lacticidven:4) ) Recent Results (from the past 240 hour(s))  Resp Panel by RT-PCR (Flu A&B, Covid) Nasopharyngeal Swab     Status: None   Collection Time: 12/12/20  2:30 PM   Specimen: Nasopharyngeal Swab; Nasopharyngeal(NP) swabs in vial transport medium  Result Value Ref Range Status   SARS Coronavirus 2 by RT PCR NEGATIVE NEGATIVE Final    Comment: (NOTE) SARS-CoV-2 target nucleic acids are NOT DETECTED.  The SARS-CoV-2 RNA is generally detectable in upper respiratory specimens during the acute phase of infection. The lowest concentration of SARS-CoV-2 viral copies this assay can detect is 138 copies/mL. A negative result does not preclude SARS-Cov-2 infection and should not be used as the sole basis for treatment or other patient management decisions. A negative result may occur with  improper specimen  collection/handling, submission of specimen other than nasopharyngeal swab, presence of viral mutation(s) within the areas targeted by this assay, and inadequate number of viral copies(<138 copies/mL). A negative result must be combined with clinical observations, patient history, and epidemiological information. The expected result is Negative.  Fact Sheet for Patients:  EntrepreneurPulse.com.au  Fact Sheet for Healthcare Providers:  IncredibleEmployment.be  This test is no t yet approved or cleared by the [place] FDA and  has been authorized for detection and/or diagnosis of SARS-CoV-2 by FDA under an Emergency Use Authorization (EUA). This EUA will remain  in effect (meaning this test can be used) for the duration of the COVID-19 declaration under Section 564(b)(1) of the Act, 21 U.S.C.section 360bbb-3(b)(1), unless the authorization is terminated  or revoked sooner.       Influenza A by PCR  NEGATIVE NEGATIVE Final   Influenza B by PCR NEGATIVE NEGATIVE Final    Comment: (NOTE) The Xpert Xpress SARS-CoV-2/FLU/RSV plus assay is intended as an aid in the diagnosis of influenza from Nasopharyngeal swab specimens and should not be used as a sole basis for treatment. Nasal washings and aspirates are unacceptable for Xpert Xpress SARS-CoV-2/FLU/RSV testing.  Fact Sheet for Patients: BloggerCourse.com  Fact Sheet for Healthcare Providers: SeriousBroker.it  This test is not yet approved or cleared by the [place] FDA and has been authorized for detection and/or diagnosis of SARS-CoV-2 by FDA under an Emergency Use Authorization (EUA). This EUA will remain in effect (meaning this test can be used) for the duration of the COVID-19 declaration under Section 564(b)(1) of the Act, 21 U.S.C. section 360bbb-3(b)(1), unless the authorization is terminated or revoked.  Performed at [facility], [address], [place], [place] [address]      Scheduled Meds: . [START ON 12/14/2020] COVID-19 mRNA vaccine (Pfizer)  0.3 mL Intramuscular ONCE-1600  . enoxaparin (LOVENOX) injection  40 mg Subcutaneous Q24H  . folic acid  1 mg Oral Daily  . multivitamin with minerals  1 tablet Oral Daily  . sodium chloride flush  3 mL Intravenous Q12H  . thiamine  100 mg Oral Daily   Or  . thiamine  100 mg Intravenous Daily   Continuous Infusions: . lactated ringers 150 mL/hr at 12/13/20 0545    Procedures/Studies: CT ABDOMEN PELVIS W CONTRAST  Result Date: 12/12/2020 CLINICAL DATA:  Abdominal pain radiating to the back, nausea and vomiting. Elevated serum lipase. EXAM: CT ABDOMEN AND PELVIS WITH CONTRAST TECHNIQUE: Multidetector CT imaging of the abdomen and pelvis was performed using the standard protocol following bolus administration of intravenous contrast. CONTRAST:  OMNIPAQUE IOHEXOL 300 MG/ML  SOLN COMPARISON:  08/16/2020 FINDINGS: Lower chest: Bibasilar subsegmental atelectasis. Hepatobiliary: No focal liver abnormality is seen. No gallstones, gallbladder wall thickening, or biliary dilatation. Pancreas: Peripancreatic fat stranding and trace free fluid centered in the region of the pancreatic body and proximal tail. Elongated cystic area within the pancreatic head measuring approximately 2.1 x 1.1 cm (series 3, image 35) is similar in appearance to the previous study. No sites of parenchymal hypoenhancement or non enhancement. No ductal dilatation is evident. Spleen: Normal in size without focal abnormality. Adrenals/Urinary Tract: Unremarkable adrenal glands. Kidneys enhance symmetrically without focal lesion, stone, or hydronephrosis. Ureters are nondilated. Urinary bladder appears unremarkable. Stomach/Bowel: Stomach is within normal limits. Appendix appears normal (series 3, image 53). No evidence of bowel wall thickening, distention, or inflammatory changes.  Vascular/Lymphatic: Scattered aortoiliac atherosclerotic calcifications without aneurysm. No abdominopelvic lymphadenopathy. Reproductive: Prostate is unremarkable. Other: Trace peripancreatic free fluid. No organized abdominopelvic fluid collection. No pneumoperitoneum. No abdominal wall hernia. Musculoskeletal: No acute or significant osseous findings. IMPRESSION: 1. Peripancreatic fat stranding and trace free fluid centered in the region of the pancreatic body and proximal tail, consistent with acute pancreatitis. 2. Elongated cystic area within the pancreatic head measuring approximately 2.1 x 1.1 cm is similar in appearance to the previous study. Findings are favored to represent a small pseudocyst given the history of pancreatitis. Cystic pancreatic neoplasm is not excluded. Attention on follow-up. 3. Aortic atherosclerosis. (ICD10-I70.0). Electronically Signed   By: Duanne Guess D.O.   On: 12/12/2020 18:02   US Abdomen Limited RUQ (LIVER/GB)  Result Date: 12/12/2020 CLINICAL DATA:  Right upper quadrant pain EXAM: ULTRASOUND ABDOMEN LIMITED RIGHT UPPER QUADRANT COMPARISON:  CT 08/16/2020 FINDINGS: Gallbladder: No gallstones or  wall thickening visualized. No sonographic Murphy sign noted by sonographer. Common bile duct: Diameter: Normal caliber, 4 mm. Liver: No focal lesion identified. Within normal limits in parenchymal echogenicity. Portal vein is patent on color Doppler imaging with normal direction of blood flow towards the liver. Other: None. IMPRESSION: Unremarkable right upper quadrant ultrasound. Electronically Signed   By: Rolm Baptise M.D.   On: 12/12/2020 13:03    Orson Eva, DO  [facility]  If 7PM-7AM, please contact night-coverage [email or web address] Password [email or web address] 12/13/2020, 11:34 AM   LOS: 1 day

## 2020-12-14 ENCOUNTER — Encounter (HOSPITAL_COMMUNITY): Payer: Self-pay | Admitting: Internal Medicine

## 2020-12-14 DIAGNOSIS — K852 Alcohol induced acute pancreatitis without necrosis or infection: Secondary | ICD-10-CM

## 2020-12-14 LAB — COMPREHENSIVE METABOLIC PANEL WITH GFR
ALT: 15 U/L (ref 0–44)
AST: 21 U/L (ref 15–41)
Albumin: 3 g/dL — ABNORMAL LOW (ref 3.5–5.0)
Alkaline Phosphatase: 53 U/L (ref 38–126)
Anion gap: 11 (ref 5–15)
BUN: 6 mg/dL (ref 6–20)
CO2: 23 mmol/L (ref 22–32)
Calcium: 9 mg/dL (ref 8.9–10.3)
Chloride: 105 mmol/L (ref 98–111)
Creatinine, Ser: 0.99 mg/dL (ref 0.61–1.24)
GFR, Estimated: >60 mL/min
Glucose, Bld: 77 mg/dL (ref 70–99)
Potassium: 3.8 mmol/L (ref 3.5–5.1)
Sodium: 139 mmol/L (ref 135–145)
Total Bilirubin: 1 mg/dL (ref 0.3–1.2)
Total Protein: 6 g/dL — ABNORMAL LOW (ref 6.5–8.1)

## 2020-12-14 NOTE — Progress Notes (Signed)
[facility] PROGRESS NOTE    [place]  [id number] DOB: 23-May-1971 DOA: 12/12/2020 PCP: No Pcp, Per Patient (Inactive)      Brief Narrative:  [name] is a 50 y.o. M with HTN and hx pancreatitis and alcohol use who presented with few days abdominal pain and vomiting.  Patient had an episode of pancreatitis secondary to heavy alcohol use, was able to quit drinking alcohol and tobacco after that episode for several months until the holidays.  For the last few days has been drinking alcohol until 12/31 he developed epigastric pain, nausea, vomiting, and melena so he came to the ER. In the ER, CT of the abdomen and pelvis showed peripancreatic fat stranding, lipase 508. Started on IV fluids and admitted.          Assessment & Plan:  Acute alcoholic pancreatitis Clinically improving. Tolerating clears this morning without pain. 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 Is likely a small pseudocyst, but a pancreatic neoplasm is not excluded. -Recommend GI referral after discharge -Will need MRI abdomen with MRCP in the next few months   Hypertension Blood pressure elevated -Continue home amlodipine  Covid vaccine -Give today, first dose  Alcohol use disorder No evidence of withdrawal -Continue CIWA scoring on demand lorazepam     Disposition: Status is: Inpatient  Remains inpatient appropriate because:IV treatments appropriate due to intensity of illness or inability to take PO   Dispo: The patient is from: Home              Anticipated d/c is to: Home              Anticipated d/c date is: 1 day              Patient currently is not medically stable to d/c.     Patient was admitted with acute pancreatitis. He is still unable to tolerate solid food. We will try to advance his diet this evening, if he tolerates it tomorrow, discharged  home.         MDM: The below labs and imaging reports were reviewed and summarized above.  Medication management as above.    DVT prophylaxis: enoxaparin (LOVENOX) injection 40 mg Start: 12/12/20 2200  Code Status: Full code          Subjective: Epigastric pain is improved, no vomiting today. No pain with clear liquids. He is hungry. No fever.  Objective: Vitals:   12/13/20 2042 12/14/20 0300 12/14/20 0339 12/14/20 1155  BP: (!) 148/82  (!) 157/91 135/90  Pulse: 71  65 64  Resp: 16  16 17   Temp: 98.2 F (36.8 C)  98.6 F (37 C) 98 F (36.7 C)  TempSrc: Oral  Oral   SpO2: 99%  100% 99%  Weight:  89.7 kg    Height:        Intake/Output Summary (Last 24 hours) at 12/14/2020 1629 Last data filed at 12/14/2020 [id number] Gross per 24 hour  Intake 2258.53 ml  Output --  Net 2258.53 ml   Filed Weights   12/12/20 0904 12/13/20 0005 12/14/20 0300  Weight: 86.2 kg 89.3 kg 89.7 kg    Examination: General appearance:  adult male, alert and in no acute distress. No acute lying in bed HEENT: Anicteric, conjunctiva pink, lids and lashes normal. No nasal deformity, discharge, epistaxis.  Lips moist, dentition normal, oropharynx moist, no oral  lesions.   Skin: Warm and dry.   No suspicious rashes or lesions. Cardiac: RRR, nl S1-S2, no murmurs appreciated.  Capillary refill is brisk. No edema. Respiratory: Normal respiratory rate and rhythm.  CTAB without rales or wheezes. Abdomen: Abdomen soft. Moderate epigastric TTP with guarding. No ascites, distension, hepatosplenomegaly.   MSK: No deformities or effusions. Neuro: Awake and alert.  EOMI, moves all extremities. Speech fluent.    Psych: Sensorium intact and responding to questions, attention normal. Affect normal.  Judgment and insight appear normal.    Data Reviewed: I have personally reviewed following labs and imaging studies:  CBC: Recent Labs  Lab 12/12/20 0920 12/13/20 0031  WBC 5.2 6.2  HGB 15.9 14.7  HCT  47.9 43.5  MCV 91.6 90.4  PLT 179 166   Basic Metabolic Panel: Recent Labs  Lab 12/12/20 0920 12/13/20 0031 12/14/20 0334  NA 138 137 139  K 3.9 3.6 3.8  CL 107 106 105  CO2 22 24 23   GLUCOSE 106* 95 77  BUN 10 8 6   CREATININE 0.85 0.83 0.99  CALCIUM 9.3 9.1 9.0  MG  --  2.1  --   PHOS  --  2.9  --    GFR: Estimated Creatinine Clearance: 106.1 mL/min (by C-G formula based on SCr of 0.99 mg/dL). Liver Function Tests: Recent Labs  Lab 12/12/20 0920 12/13/20 0031 12/14/20 0334  AST 25 21 21   ALT 19 19 15   ALKPHOS 64 58 53  BILITOT 1.0 0.9 1.0  PROT 7.7 7.0 6.0*  ALBUMIN 4.1 3.6 3.0*   Recent Labs  Lab 12/12/20 0920  LIPASE 508*   No results for input(s): AMMONIA in the last 168 hours. Coagulation Profile: No results for input(s): INR, PROTIME in the last 168 hours. Cardiac Enzymes: No results for input(s): CKTOTAL, CKMB, CKMBINDEX, TROPONINI in the last 168 hours. BNP (last 3 results) No results for input(s): PROBNP in the last 8760 hours. HbA1C: No results for input(s): HGBA1C in the last 72 hours. CBG: No results for input(s): GLUCAP in the last 168 hours. Lipid Profile: Recent Labs    12/13/20 0031  CHOL 190  HDL 33*  LDLCALC 140*  TRIG 86  CHOLHDL 5.8   Thyroid Function Tests: No results for input(s): TSH, T4TOTAL, FREET4, T3FREE, THYROIDAB in the last 72 hours. Anemia Panel: No results for input(s): VITAMINB12, FOLATE, FERRITIN, TIBC, IRON, RETICCTPCT in the last 72 hours. Urine analysis:    Component Value Date/Time   COLORURINE YELLOW 12/12/2020 1044   APPEARANCEUR HAZY (A) 12/12/2020 1044   LABSPEC 1.026 12/12/2020 1044   PHURINE 7.0 12/12/2020 1044   GLUCOSEU NEGATIVE 12/12/2020 1044   HGBUR NEGATIVE 12/12/2020 1044   BILIRUBINUR NEGATIVE 12/12/2020 1044   KETONESUR 20 (A) 12/12/2020 1044   PROTEINUR 30 (A) 12/12/2020 1044   UROBILINOGEN 0.2 10/27/2011 0957   NITRITE NEGATIVE 12/12/2020 1044   LEUKOCYTESUR NEGATIVE 12/12/2020 1044    Sepsis Labs: @LABRCNTIP (procalcitonin:4,lacticacidven:4)  ) Recent Results (from the past 240 hour(s))  Resp Panel by RT-PCR (Flu A&B, Covid) Nasopharyngeal Swab     Status: None   Collection Time: 12/12/20  2:30 PM   Specimen: Nasopharyngeal Swab; Nasopharyngeal(NP) swabs in vial transport medium  Result Value Ref Range Status   SARS Coronavirus 2 by RT PCR NEGATIVE NEGATIVE Final    Comment: (NOTE) SARS-CoV-2 target nucleic acids are NOT DETECTED.  The SARS-CoV-2 RNA is generally detectable in upper respiratory specimens during the acute phase of infection. The lowest concentration of SARS-CoV-2 viral  copies this assay can detect is 138 copies/mL. A negative result does not preclude SARS-Cov-2 infection and should not be used as the sole basis for treatment or other patient management decisions. A negative result may occur with  improper specimen collection/handling, submission of specimen other than nasopharyngeal swab, presence of viral mutation(s) within the areas targeted by this assay, and inadequate number of viral copies(<138 copies/mL). A negative result must be combined with clinical observations, patient history, and epidemiological information. The expected result is Negative.  Fact Sheet for Patients:  EntrepreneurPulse.com.au  Fact Sheet for Healthcare Providers:  IncredibleEmployment.be  This test is no t yet approved or cleared by the [place] FDA and  has been authorized for detection and/or diagnosis of SARS-CoV-2 by FDA under an Emergency Use Authorization (EUA). This EUA will remain  in effect (meaning this test can be used) for the duration of the COVID-19 declaration under Section 564(b)(1) of the Act, 21 U.S.C.section 360bbb-3(b)(1), unless the authorization is terminated  or revoked sooner.       Influenza A by PCR NEGATIVE NEGATIVE Final   Influenza B by PCR NEGATIVE NEGATIVE Final    Comment:  (NOTE) The Xpert Xpress SARS-CoV-2/FLU/RSV plus assay is intended as an aid in the diagnosis of influenza from Nasopharyngeal swab specimens and should not be used as a sole basis for treatment. Nasal washings and aspirates are unacceptable for Xpert Xpress SARS-CoV-2/FLU/RSV testing.  Fact Sheet for Patients: EntrepreneurPulse.com.au  Fact Sheet for Healthcare Providers: IncredibleEmployment.be  This test is not yet approved or cleared by the [place] FDA and has been authorized for detection and/or diagnosis of SARS-CoV-2 by FDA under an Emergency Use Authorization (EUA). This EUA will remain in effect (meaning this test can be used) for the duration of the COVID-19 declaration under Section 564(b)(1) of the Act, 21 U.S.C. section 360bbb-3(b)(1), unless the authorization is terminated or revoked.  Performed at [facility], [place] [address], [place], [place] [address]          Radiology Studies: CT ABDOMEN PELVIS W CONTRAST  Result Date: 12/12/2020 CLINICAL DATA:  Abdominal pain radiating to the back, nausea and vomiting. Elevated serum lipase. EXAM: CT ABDOMEN AND PELVIS WITH CONTRAST TECHNIQUE: Multidetector CT imaging of the abdomen and pelvis was performed using the standard protocol following bolus administration of intravenous contrast. CONTRAST:  125mL OMNIPAQUE IOHEXOL 300 MG/ML  SOLN COMPARISON:  08/16/2020 FINDINGS: Lower chest: Bibasilar subsegmental atelectasis. Hepatobiliary: No focal liver abnormality is seen. No gallstones, gallbladder wall thickening, or biliary dilatation. Pancreas: Peripancreatic fat stranding and trace free fluid centered in the region of the pancreatic body and proximal tail. Elongated cystic area within the pancreatic head measuring approximately 2.1 x 1.1 cm (series 3, image 35) is similar in appearance to the previous study. No sites of parenchymal hypoenhancement or non enhancement. No ductal  dilatation is evident. Spleen: Normal in size without focal abnormality. Adrenals/Urinary Tract: Unremarkable adrenal glands. Kidneys enhance symmetrically without focal lesion, stone, or hydronephrosis. Ureters are nondilated. Urinary bladder appears unremarkable. Stomach/Bowel: Stomach is within normal limits. Appendix appears normal (series 3, image 53). No evidence of bowel wall thickening, distention, or inflammatory changes. Vascular/Lymphatic: Scattered aortoiliac atherosclerotic calcifications without aneurysm. No abdominopelvic lymphadenopathy. Reproductive: Prostate is unremarkable. Other: Trace peripancreatic free fluid. No organized abdominopelvic fluid collection. No pneumoperitoneum. No abdominal wall hernia. Musculoskeletal: No acute or significant osseous findings. IMPRESSION: 1. Peripancreatic fat stranding and trace free fluid centered in the region of the pancreatic body and proximal tail,  consistent with acute pancreatitis. 2. Elongated cystic area within the pancreatic head measuring approximately 2.1 x 1.1 cm is similar in appearance to the previous study. Findings are favored to represent a small pseudocyst given the history of pancreatitis. Cystic pancreatic neoplasm is not excluded. Attention on follow-up. 3. Aortic atherosclerosis. (ICD10-I70.0). Electronically Signed   By: Davina Poke D.O.   On: 12/12/2020 18:02        Scheduled Meds: . amLODipine  5 mg Oral Daily  . COVID-19 mRNA vaccine (Pfizer)  0.3 mL Intramuscular ONCE-1600  . enoxaparin (LOVENOX) injection  40 mg Subcutaneous Q24H  . folic acid  1 mg Oral Daily  . multivitamin with minerals  1 tablet Oral Daily  . sodium chloride flush  3 mL Intravenous Q12H  . thiamine  100 mg Oral Daily   Continuous Infusions: . lactated ringers 150 mL/hr at 12/14/20 1253     LOS: 2 days    Time spent: 25 minutes    Edwin Dada, MD [facility] 12/14/2020, 4:29 PM     Please page though [place] or  Epic secure chat:  For [employer], Adult nurse

## 2020-12-14 NOTE — Progress Notes (Signed)
Pt received his COVID vaccine. Pt denies any symptoms at this time.

## 2020-12-15 NOTE — Plan of Care (Signed)
  Problem: Education: Goal: Knowledge of General Education information will improve Description: Including pain rating scale, medication(s)/side effects and non-pharmacologic comfort measures Outcome: Adequate for Discharge   Problem: Health Behavior/Discharge Planning: Goal: Ability to manage health-related needs will improve Outcome: Adequate for Discharge   Problem: Clinical Measurements: Goal: Ability to maintain clinical measurements within normal limits will improve Outcome: Adequate for Discharge Goal: Will remain free from infection Outcome: Adequate for Discharge Goal: Diagnostic test results will improve Outcome: Adequate for Discharge   Problem: Activity: Goal: Risk for activity intolerance will decrease Outcome: Adequate for Discharge   Problem: Nutrition: Goal: Adequate nutrition will be maintained Outcome: Adequate for Discharge   Problem: Coping: Goal: Level of anxiety will decrease Outcome: Adequate for Discharge   Problem: Elimination: Goal: Will not experience complications related to bowel motility Outcome: Adequate for Discharge Goal: Will not experience complications related to urinary retention Outcome: Adequate for Discharge   Problem: Pain Managment: Goal: General experience of comfort will improve Outcome: Adequate for Discharge   Problem: Safety: Goal: Ability to remain free from injury will improve Outcome: Adequate for Discharge   Problem: Skin Integrity: Goal: Risk for impaired skin integrity will decrease Outcome: Adequate for Discharge

## 2020-12-15 NOTE — Discharge Summary (Signed)
Physician Discharge Summary  Max Ayers [id number] DOB: [birth year] DOA: 12/12/2020  PCP: No Pcp, Per Patient (Inactive)  Admit date: 12/12/2020 Discharge date: 12/15/2020  Admitted From: Home  Disposition:  Home   Recommendations for Outpatient Follow-up:  1. Follow up with a new PCP in 1-2 weeks 2. PCP: Please check BP and start antihypertensive if still elevated BP 3. Gastroenterology: Please follow up pancreatic cyst vs pseudocyst      Home Health: None  Equipment/Devices: None  Discharge Condition: Good  CODE STATUS: FULL Diet recommendation: Bland  Brief/Interim Summary: Max Ayers is a 50 y.o. M with HTN and hx pancreatitis and alcohol use who presented with few days abdominal pain and vomiting.  Patient had an episode of pancreatitis secondary to heavy alcohol use, was able to quit drinking alcohol and tobacco after that episode for several months until the holidays.  For the last few days has been drinking alcohol until 12/31 he developed epigastric pain, nausea, vomiting, and melena so he came to the ER. In the ER, CT of the abdomen and pelvis showed peripancreatic fat stranding, lipase 508. Started on IV fluids and admitted.     PRINCIPAL HOSPITAL DIAGNOSIS: Acute alcoholic pancreatitis    Discharge Diagnoses:   Acute alcoholic pancreatitis Admitted and allowed bowel rest and symptomatic care.    Clinically improved, tolerated a bland diet, taking fluids well.      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 Is likely a small pseudocyst, but a pancreatic neoplasm is not excluded on CT.  Referral to GI recommended for MRI/MRCP and consultation.     Elevated blood pressure without a diagnosis of hypertension Blood pressure elevated possibly due to pain, possible essential hypertension.  Advised close PCP follow up after resolution of this illness.   Covid vaccine Given first dose.   Alcohol use disorder No evidence of  withdrawal here             Discharge Instructions  Discharge Instructions    Diet - low sodium heart healthy   Complete by: As directed    Discharge instructions   Complete by: As directed    From Dr. Loleta Books: You were admitted with pancreatitis.  To help the pancreatitis continue to resolve: -Drink plenty of water!  At least 64 ounces per day -For the next few days, eat ONLY bland food (saltines, graham crackers, bananas, rice, toast, applesauce, mashed potatoes) -Avoid meat, fat, dairy, protein --> stick with bland foods  You should follow up with [facility] as soon as you are able. If you don't have a hospital follow up scheduled with them already, call them to schedule as soon as able  Have them check your blood pressure   For the cyst: You should go see a gastroenterologist in the next 6 months Try to call [facility] (their number is listed below) They will order an MRI to follow up the cyst, which as we said, is more likely a consequence of pancreatitis which is not dangerous or going to do anything (a "pancreatic pseudocyst") but may be an early cancer  If [facility] says you "need a referral", call my office and ask the secretary to ask Dr. Loleta Books help you get the referral [phone]   Increase activity slowly   Complete by: As directed      Allergies as of 12/15/2020      Reactions   Beeswax Hives, Swelling   Allergic to bees   Yellow  Jacket Venom [bee Venom] Anaphylaxis      Medication List    STOP taking these medications   omeprazole 20 MG capsule Commonly known as: PRILOSEC     TAKE these medications   EPINEPHrine 0.3 mg/0.3 mL Soaj injection Commonly known as: EPI-PEN Inject 0.3 mg into the muscle daily as needed for anaphylaxis (call 911).   multivitamin tablet Take 1 tablet by mouth daily.       Follow-up Information    [facility]. Go on 01/06/2021.   Why: @11 :10am Contact  information: [address] [place] [phone]       [facility]. Schedule an appointment as soon as possible for a visit in 1 month(s).   Specialty: Gastroenterology Contact information: [address] [place] [phone]             Allergies  Allergen Reactions  . Beeswax Hives and Swelling    Allergic to bees  . Yellow Jacket Venom [Bee Venom] Anaphylaxis       Procedures/Studies: CT ABDOMEN PELVIS W CONTRAST  Result Date: 12/12/2020 CLINICAL DATA:  Abdominal pain radiating to the back, nausea and vomiting. Elevated serum lipase. EXAM: CT ABDOMEN AND PELVIS WITH CONTRAST TECHNIQUE: Multidetector CT imaging of the abdomen and pelvis was performed using the standard protocol following bolus administration of intravenous contrast. CONTRAST:  02/09/2021 OMNIPAQUE IOHEXOL 300 MG/ML  SOLN COMPARISON:  08/16/2020 FINDINGS: Lower chest: Bibasilar subsegmental atelectasis. Hepatobiliary: No focal liver abnormality is seen. No gallstones, gallbladder wall thickening, or biliary dilatation. Pancreas: Peripancreatic fat stranding and trace free fluid centered in the region of the pancreatic body and proximal tail. Elongated cystic area within the pancreatic head measuring approximately 2.1 x 1.1 cm (series 3, image 35) is similar in appearance to the previous study. No sites of parenchymal hypoenhancement or non enhancement. No ductal dilatation is evident. Spleen: Normal in size without focal abnormality. Adrenals/Urinary Tract: Unremarkable adrenal glands. Kidneys enhance symmetrically without focal lesion, stone, or hydronephrosis. Ureters are nondilated. Urinary bladder appears unremarkable. Stomach/Bowel: Stomach is within normal limits. Appendix appears normal (series 3, image 53). No evidence of bowel wall thickening, distention, or inflammatory changes. Vascular/Lymphatic: Scattered aortoiliac atherosclerotic  calcifications without aneurysm. No abdominopelvic lymphadenopathy. Reproductive: Prostate is unremarkable. Other: Trace peripancreatic free fluid. No organized abdominopelvic fluid collection. No pneumoperitoneum. No abdominal wall hernia. Musculoskeletal: No acute or significant osseous findings. IMPRESSION: 1. Peripancreatic fat stranding and trace free fluid centered in the region of the pancreatic body and proximal tail, consistent with acute pancreatitis. 2. Elongated cystic area within the pancreatic head measuring approximately 2.1 x 1.1 cm is similar in appearance to the previous study. Findings are favored to represent a small pseudocyst given the history of pancreatitis. Cystic pancreatic neoplasm is not excluded. Attention on follow-up. 3. Aortic atherosclerosis. (ICD10-I70.0). Electronically Signed   By: 10/16/2020 D.O.   On: 12/12/2020 18:02   02/09/2021 Abdomen Limited RUQ (LIVER/GB)  Result Date: 12/12/2020 CLINICAL DATA:  Right upper quadrant pain EXAM: ULTRASOUND ABDOMEN LIMITED RIGHT UPPER QUADRANT COMPARISON:  CT 08/16/2020 FINDINGS: Gallbladder: No gallstones or wall thickening visualized. No sonographic Murphy sign noted by sonographer. Common bile duct: Diameter: Normal caliber, 4 mm. Liver: No focal lesion identified. Within normal limits in parenchymal echogenicity. Portal vein is patent on color Doppler imaging with normal direction of blood flow towards the liver. Other: None. IMPRESSION: Unremarkable right upper quadrant ultrasound. Electronically Signed   By: 10/16/2020 M.D.  On: 12/12/2020 13:03       Subjective: Feeling well.  A little nausea after eating an egg last night.  No fever, no vomiting, no confusion. No swelling. No chest pain.  Discharge Exam: Vitals:   12/14/20 1951 12/15/20 0421  BP: (!) 146/77 (!) 161/87  Pulse: 69 64  Resp: 16 19  Temp: 98.4 F (36.9 C) 98.2 F (36.8 C)  SpO2: 100% 99%   Vitals:   12/14/20 0339 12/14/20 1155 12/14/20 1951  12/15/20 0421  BP: (!) 157/91 135/90 (!) 146/77 (!) 161/87  Pulse: 65 64 69 64  Resp: 16 17 16 19   Temp: 98.6 F (37 C) 98 F (36.7 C) 98.4 F (36.9 C) 98.2 F (36.8 C)  TempSrc: Oral  Oral Oral  SpO2: 100% 99% 100% 99%  Weight:    89 kg  Height:        General: Pt is alert, awake, not in acute distress Cardiovascular: RRR, nl S1-S2, no murmurs appreciated.   No LE edema.   Respiratory: Normal respiratory rate and rhythm.  CTAB without rales or wheezes. Abdominal: Abdomen soft and mild epigastric tenderness iwthout gaurding.  No distension or HSM.   Neuro/Psych: Strength symmetric in upper and lower extremities.  Judgment and insight appear normal.   The results of significant diagnostics from this hospitalization (including imaging, microbiology, ancillary and laboratory) are listed below for reference.     Microbiology: Recent Results (from the past 240 hour(s))  Resp Panel by RT-PCR (Flu A&B, Covid) Nasopharyngeal Swab     Status: None   Collection Time: 12/12/20  2:30 PM   Specimen: Nasopharyngeal Swab; Nasopharyngeal(NP) swabs in vial transport medium  Result Value Ref Range Status   SARS Coronavirus 2 by RT PCR NEGATIVE NEGATIVE Final    Comment: (NOTE) SARS-CoV-2 target nucleic acids are NOT DETECTED.  The SARS-CoV-2 RNA is generally detectable in upper respiratory specimens during the acute phase of infection. The lowest concentration of SARS-CoV-2 viral copies this assay can detect is 138 copies/mL. A negative result does not preclude SARS-Cov-2 infection and should not be used as the sole basis for treatment or other patient management decisions. A negative result may occur with  improper specimen collection/handling, submission of specimen other than nasopharyngeal swab, presence of viral mutation(s) within the areas targeted by this assay, and inadequate number of viral copies(<138 copies/mL). A negative result must be combined with clinical observations,  patient history, and epidemiological information. The expected result is Negative.  Fact Sheet for Patients:  EntrepreneurPulse.com.au  Fact Sheet for Healthcare Providers:  IncredibleEmployment.be  This test is no t yet approved or cleared by the [place] FDA and  has been authorized for detection and/or diagnosis of SARS-CoV-2 by FDA under an Emergency Use Authorization (EUA). This EUA will remain  in effect (meaning this test can be used) for the duration of the COVID-19 declaration under Section 564(b)(1) of the Act, 21 U.S.C.section 360bbb-3(b)(1), unless the authorization is terminated  or revoked sooner.       Influenza A by PCR NEGATIVE NEGATIVE Final   Influenza B by PCR NEGATIVE NEGATIVE Final    Comment: (NOTE) The Xpert Xpress SARS-CoV-2/FLU/RSV plus assay is intended as an aid in the diagnosis of influenza from Nasopharyngeal swab specimens and should not be used as a sole basis for treatment. Nasal washings and aspirates are unacceptable for Xpert Xpress SARS-CoV-2/FLU/RSV testing.  Fact Sheet for Patients: EntrepreneurPulse.com.au  Fact Sheet for Healthcare Providers: IncredibleEmployment.be  This test is not yet  approved or cleared by the [place] and has been authorized for detection and/or diagnosis of SARS-CoV-2 by FDA under an Emergency Use Authorization (EUA). This EUA will remain in effect (meaning this test can be used) for the duration of the COVID-19 declaration under Section 564(b)(1) of the Act, 21 U.S.C. section 360bbb-3(b)(1), unless the authorization is terminated or revoked.  Performed at [facility], [place] [address] [place], [place] [address]      Labs: BNP (last 3 results) No results for input(s): BNP in the last 8760 hours. Basic Metabolic Panel: Recent Labs  Lab 12/12/20 0920 12/13/20 0031 12/14/20 0334  NA 138 137 139  K 3.9 3.6 3.8   CL 107 106 105  CO2 22 24 23   GLUCOSE 106* 95 77  BUN 10 8 6   CREATININE 0.85 0.83 0.99  CALCIUM 9.3 9.1 9.0  MG  --  2.1  --   PHOS  --  2.9  --    Liver Function Tests: Recent Labs  Lab 12/12/20 0920 12/13/20 0031 12/14/20 0334  AST 25 21 21   ALT 19 19 15   ALKPHOS 64 58 53  BILITOT 1.0 0.9 1.0  PROT 7.7 7.0 6.0*  ALBUMIN 4.1 3.6 3.0*   Recent Labs  Lab 12/12/20 0920  LIPASE 508*   No results for input(s): AMMONIA in the last 168 hours. CBC: Recent Labs  Lab 12/12/20 0920 12/13/20 0031  WBC 5.2 6.2  HGB 15.9 14.7  HCT 47.9 43.5  MCV 91.6 90.4  PLT 179 166   Cardiac Enzymes: No results for input(s): CKTOTAL, CKMB, CKMBINDEX, TROPONINI in the last 168 hours. BNP: Invalid input(s): POCBNP CBG: No results for input(s): GLUCAP in the last 168 hours. D-Dimer No results for input(s): DDIMER in the last 72 hours. Hgb A1c No results for input(s): HGBA1C in the last 72 hours. Lipid Profile Recent Labs    12/13/20 0031  CHOL 190  HDL 33*  LDLCALC 140*  TRIG 86  CHOLHDL 5.8   Thyroid function studies No results for input(s): TSH, T4TOTAL, T3FREE, THYROIDAB in the last 72 hours.  Invalid input(s): FREET3 Anemia work up No results for input(s): VITAMINB12, FOLATE, FERRITIN, TIBC, IRON, RETICCTPCT in the last 72 hours. Urinalysis    Component Value Date/Time   COLORURINE YELLOW 12/12/2020 1044   APPEARANCEUR HAZY (A) 12/12/2020 1044   LABSPEC 1.026 12/12/2020 1044   PHURINE 7.0 12/12/2020 1044   GLUCOSEU NEGATIVE 12/12/2020 1044   HGBUR NEGATIVE 12/12/2020 1044   BILIRUBINUR NEGATIVE 12/12/2020 1044   KETONESUR 20 (A) 12/12/2020 1044   PROTEINUR 30 (A) 12/12/2020 1044   UROBILINOGEN 0.2 10/27/2011 0957   NITRITE NEGATIVE 12/12/2020 1044   LEUKOCYTESUR NEGATIVE 12/12/2020 1044   Sepsis Labs Invalid input(s): PROCALCITONIN,  WBC,  LACTICIDVEN Microbiology Recent Results (from the past 240 hour(s))  Resp Panel by RT-PCR (Flu A&B, Covid)  Nasopharyngeal Swab     Status: None   Collection Time: 12/12/20  2:30 PM   Specimen: Nasopharyngeal Swab; Nasopharyngeal(NP) swabs in vial transport medium  Result Value Ref Range Status   SARS Coronavirus 2 by RT PCR NEGATIVE NEGATIVE Final    Comment: (NOTE) SARS-CoV-2 target nucleic acids are NOT DETECTED.  The SARS-CoV-2 RNA is generally detectable in upper respiratory specimens during the acute phase of infection. The lowest concentration of SARS-CoV-2 viral copies this assay can detect is 138 copies/mL. A negative result does not preclude SARS-Cov-2 infection and should not be used as the sole basis for treatment  or other patient management decisions. A negative result may occur with  improper specimen collection/handling, submission of specimen other than nasopharyngeal swab, presence of viral mutation(s) within the areas targeted by this assay, and inadequate number of viral copies(<138 copies/mL). A negative result must be combined with clinical observations, patient history, and epidemiological information. The expected result is Negative.  Fact Sheet for Patients:  EntrepreneurPulse.com.au  Fact Sheet for Healthcare Providers:  IncredibleEmployment.be  This test is no t yet approved or cleared by the [place] FDA and  has been authorized for detection and/or diagnosis of SARS-CoV-2 by FDA under an Emergency Use Authorization (EUA). This EUA will remain  in effect (meaning this test can be used) for the duration of the COVID-19 declaration under Section 564(b)(1) of the Act, 21 U.S.C.section 360bbb-3(b)(1), unless the authorization is terminated  or revoked sooner.       Influenza A by PCR NEGATIVE NEGATIVE Final   Influenza B by PCR NEGATIVE NEGATIVE Final    Comment: (NOTE) The Xpert Xpress SARS-CoV-2/FLU/RSV plus assay is intended as an aid in the diagnosis of influenza from Nasopharyngeal swab specimens and should not be  used as a sole basis for treatment. Nasal washings and aspirates are unacceptable for Xpert Xpress SARS-CoV-2/FLU/RSV testing.  Fact Sheet for Patients: EntrepreneurPulse.com.au  Fact Sheet for Healthcare Providers: IncredibleEmployment.be  This test is not yet approved or cleared by the [place] FDA and has been authorized for detection and/or diagnosis of SARS-CoV-2 by FDA under an Emergency Use Authorization (EUA). This EUA will remain in effect (meaning this test can be used) for the duration of the COVID-19 declaration under Section 564(b)(1) of the Act, 21 U.S.C. section 360bbb-3(b)(1), unless the authorization is terminated or revoked.  Performed at [facility], [place] [address] [place], [place] [address]      Time coordinating discharge: 25 minutes      SIGNED:   Edwin Dada, MD  [facility] 12/15/2020, 9:24 AM

## 2020-12-15 NOTE — Progress Notes (Signed)
D/C instructions given and reviewed. No questions asked but encouraged to call with any concerns. IV removed, tolerated well. 

## 2021-01-06 ENCOUNTER — Inpatient Hospital Stay: Payer: Self-pay | Admitting: Physician Assistant

## 2021-01-06 NOTE — Progress Notes (Deleted)
Patient ID: Max Ayers, male   DOB: 19-Feb-1971, 50 y.o.   MRN: [id number]  After hospitalization 1/1-12/15/2020  From discharge summary: Brief/Interim Summary: [name] a 50 y.o.Mwith HTN and hx pancreatitis and alcohol use who presented with few days abdominal pain and vomiting.  Patient had an episode of pancreatitis secondary to heavy alcohol use, was able to quit drinking alcohol and tobacco after that episode for several months until the holidays.  For the last few days has been drinking alcohol until 12/31 he developed epigastric pain, nausea, vomiting, and melena so he came to the ER. In the ER, CT of the abdomen and pelvis showed peripancreatic fat stranding, lipase 508. Started on IV fluids and admitted.     PRINCIPAL HOSPITAL DIAGNOSIS: Acute alcoholic pancreatitis  Discharge Diagnoses:   Acute alcoholic pancreatitis Admitted and allowed bowel rest and symptomatic care.    Clinically improved, tolerated a bland diet, taking fluids well.      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 Is likely a small pseudocyst, but a pancreatic neoplasm is not excluded on CT.  Referral to GI recommended for MRI/MRCP and consultation.     Elevated blood pressure without a diagnosis of hypertension Blood pressure elevated possibly due to pain, possible essential hypertension.  Advised close PCP follow up after resolution of this illness.   Covid vaccine Given first dose.   Alcohol use disorder No evidence of withdrawal here

## 2021-03-12 ENCOUNTER — Encounter: Payer: Self-pay | Admitting: Physician Assistant

## 2021-03-31 ENCOUNTER — Encounter: Payer: Self-pay | Admitting: Physician Assistant

## 2021-03-31 ENCOUNTER — Other Ambulatory Visit: Payer: Self-pay

## 2021-03-31 ENCOUNTER — Ambulatory Visit (INDEPENDENT_AMBULATORY_CARE_PROVIDER_SITE_OTHER): Payer: Medicaid Other | Admitting: Physician Assistant

## 2021-03-31 ENCOUNTER — Other Ambulatory Visit (INDEPENDENT_AMBULATORY_CARE_PROVIDER_SITE_OTHER): Payer: Medicaid Other

## 2021-03-31 VITALS — BP 128/62 | HR 67 | Ht 72.0 in | Wt 201.8 lb

## 2021-03-31 DIAGNOSIS — K869 Disease of pancreas, unspecified: Secondary | ICD-10-CM

## 2021-03-31 DIAGNOSIS — Z8719 Personal history of other diseases of the digestive system: Secondary | ICD-10-CM

## 2021-03-31 DIAGNOSIS — Z1211 Encounter for screening for malignant neoplasm of colon: Secondary | ICD-10-CM | POA: Diagnosis not present

## 2021-03-31 DIAGNOSIS — R935 Abnormal findings on diagnostic imaging of other abdominal regions, including retroperitoneum: Secondary | ICD-10-CM

## 2021-03-31 LAB — BASIC METABOLIC PANEL
BUN: 13 mg/dL (ref 6–23)
CO2: 30 mEq/L (ref 19–32)
Calcium: 9.6 mg/dL (ref 8.4–10.5)
Chloride: 106 mEq/L (ref 96–112)
Creatinine, Ser: 1.02 mg/dL (ref 0.40–1.50)
GFR: 87.03 mL/min (ref >60.00)
Glucose, Bld: 81 mg/dL (ref 70–99)
Potassium: 4.1 mEq/L (ref 3.5–5.1)
Sodium: 142 mEq/L (ref 135–145)

## 2021-03-31 MED ORDER — NA SULFATE-K SULFATE-MG SULF 17.5-3.13-1.6 GM/177ML PO SOLN: 1.0000 | Freq: Once | ORAL | 0 refills | Status: AC

## 2021-03-31 NOTE — Progress Notes (Signed)
Subjective:    Patient ID: Max Ayers, male    DOB: 05-18-1971, 50 y.o.   MRN: [id number]  HPI Max Ayers is a pleasant 50 year old African-American male, referred today by [facility]/Dr. Loleta Books for consideration of MRI/MRCP after hospitalization in January 2022 for acute pancreatitis. Patient has history of fairly heavy EtOH use and had ER visit in September 2021 for abdominal pain, and CT imaging was done at that time showing stranding around the pancreatic tail probably reflecting early changes of acute pancreatitis.  There was a cystic area within the pancreatic head possibly representing a pseudocyst.  He did not require admission at that time.  He was then admitted from December 12, 2020 through December 15, 2020 with acute pancreatitis felt to be EtOH induced.  Per the notes, patient had stopped drinking after the episode in September until around the holidays when he resumed drinking. He had lipase of 508 on admission. CT of the abdomen and pelvis was done showing peripancreatic fat stranding of the pancreatic body and proximal tail.  There is an elongated cystic area measuring 2.1 x 1.1 cm within the head of the pancreas similar to the prior CT scan of September 2021.  Could not rule out small pseudocyst versus cystic neoplasm.  Normal-appearing liver, no gallstones or gallbladder wall thickening or biliary dilation, normal-appearing spleen.  Patient says he stopped drinking again after that admission, but occasionally will still have a drink.  He says he has been doing okay over the past month, occasionally will have some mild abdominal discomfort but nothing on a regular basis.  His appetite has been fine, weight has been stable, no complaints of nausea or vomiting.  He says sometimes he feels that he feels up quickly.  Bowel movements have been normal no melena or hematochezia.  He occasionally will have heartburn or indigestion generally with pizza etc. nothing on a daily basis and  no complaints of dysphagia or odynophagia. Patient has not had any prior GI evaluation, no prior colonoscopy. Not on any regular meds. No family history of colon cancer that he is aware of.  Review of Systems Pertinent positive and negative review of systems were noted in the above HPI section.  All other review of systems was otherwise negative.  Outpatient Encounter Medications as of 03/31/2021  Medication Sig  . EPINEPHrine 0.3 mg/0.3 mL IJ SOAJ injection Inject 0.3 mg into the muscle daily as needed for anaphylaxis (call 911).  . Multiple Vitamin (MULTIVITAMIN) tablet Take 1 tablet by mouth daily.  . Na Sulfate-K Sulfate-Mg Sulf 17.5-3.13-1.6 GM/177ML SOLN Take 1 kit by mouth once for 1 dose.   No facility-administered encounter medications on file as of 03/31/2021.   Allergies  Allergen Reactions  . Beeswax Hives and Swelling    Allergic to bees  . Yellow Jacket Venom [Bee Venom] Anaphylaxis   Patient Active Problem List   Diagnosis Date Noted  . Pancreatitis 12/12/2020  . Benign essential HTN 12/12/2020  . Alcohol abuse 12/12/2020  . Tobacco use 12/12/2020   Social History   Socioeconomic History  . Marital status: Married    Spouse name: Not on file  . Number of children: Not on file  . Years of education: Not on file  . Highest education level: Not on file  Occupational History  . Not on file  Tobacco Use  . Smoking status: Former Smoker    Packs/day: 1.00    Types: Cigarettes, Cigars  . Smokeless tobacco: Never Used  Vaping Use  .  Vaping Use: Every day  Substance and Sexual Activity  . Alcohol use: Yes    Comment: occ  . Drug use: No  . Sexual activity: Yes    Birth control/protection: Condom  Other Topics Concern  . Not on file  Social History Narrative  . Not on file   Social Determinants of Health   Financial Resource Strain: Not on file  Food Insecurity: Not on file  Transportation Needs: Not on file  Physical Activity: Not on file  Stress: Not  on file  Social Connections: Not on file  Intimate Partner Violence: Not on file    Max Ayers's family history includes Breast cancer in his maternal grandmother; Diabetes in his mother; Hypertension in his father.      Objective:    Vitals:   03/31/21 1500  BP: 128/62  Pulse: 67  SpO2: 98%    Physical Exam Well-developed well-nourished AA male  in no acute distress.  Height, [id number], BMI27.3 accompanied by his wife  HEENT; nontraumatic normocephalic, EOMI, PE R LA, sclera anicteric. Oropharynx ;not done  Neck; supple, no JVD Cardiovascular; regular rate and rhythm with S1-S2, no murmur rub or gallop Pulmonary; Clear bilaterally Abdomen; soft, nontender, nondistended, no palpable mass or hepatosplenomegaly, bowel sounds are active Rectal;not done Skin; benign exam, no jaundice rash or appreciable lesions Extremities; no clubbing cyanosis or edema skin warm and dry Neuro/Psych; alert and oriented x4, grossly nonfocal mood and affect appropriate       Assessment & Plan:   #29 50 year old male with 2 episodes of pancreatitis within the past 8 months, one requiring brief hospitalization in January 2022.  Patient has been doing well since. Etiology felt EtOH induced Normal-appearing gallbladder and no ductal dilation on CT imaging.  Normal LFTs on admission.  Patient has stopped regular EtOH consumption though admits to occasional drinks.  #2 abnormal CT imaging with an elongated cystic area 2.1 x 1.1 cm within the pancreatic head seen on CT September 2021 and again January 2022 rule out small pseudocyst, cannot rule out cystic neoplasm.  #3 colon cancer screening-no prior colonoscopy, average risk  Plan; Encouraged  patient to completely discontinue any EtOH use in setting of recurrent pancreatitis.  Will schedule for MRI/MRCP to further evaluate the cystic lesion seen on CT  Discussed colon cancer screening with the patient and his spouse.  Patient will be scheduled  for colonoscopy with Dr. Rush Landmark . Procedure was discussed in detail with patient including indications risks and benefits and he is agreeable to proceed.  Further recommendations pending findings at MRI and colonoscopy.  Max Ayers S Jeryn Cerney PA-C 03/31/2021   Cc: No ref. provider found

## 2021-03-31 NOTE — Patient Instructions (Signed)
If you are age 49 or older, your body mass index should be between 23-30. Your Body mass index is 27.37 kg/m. If this is out of the aforementioned range listed, please consider follow up with your Primary Care Provider.  If you are age 49 or younger, your body mass index should be between 19-25. Your Body mass index is 27.37 kg/m. If this is out of the aformentioned range listed, please consider follow up with your Primary Care Provider.   You have been scheduled for a colonoscopy. Please follow written instructions given to you at your visit today.  Please pick up your prep supplies at the pharmacy within the next 1-3 days. If you use inhalers (even only as needed), please bring them with you on the day of your procedure.  You have been scheduled for an MRI at [facility] on 04/09/2021. Your appointment time is 8:30 am. Please arrive 15 minutes prior to your appointment time for registration purposes. Please make certain not to have anything to eat or drink 4 hours prior to your test. In addition, if you have any metal in your body, have a pacemaker or defibrillator, please be sure to let your ordering physician know. This test typically takes 45 minutes to 1 hour to complete. Should you need to reschedule, please call [phone] to do so.  Your provider has requested that you go to the basement level for lab work before leaving today. Press "B" on the elevator. The lab is located at the first door on the left as you exit the elevator.  Follow up pending at this time.  Thank you for entrusting me with your care and choosing [facility].  Amy Esterwood, PA-C

## 2021-04-01 NOTE — Progress Notes (Signed)
Attending Physician's Attestation   I have reviewed the chart.   I agree with the Advanced Practitioner's note, impression, and recommendations with any updates as below.    Jamicheal Heard Mansouraty, MD [facility] Advanced Endoscopy Office # [phone]  

## 2021-04-09 ENCOUNTER — Other Ambulatory Visit: Payer: Self-pay | Admitting: Physician Assistant

## 2021-04-09 ENCOUNTER — Ambulatory Visit (HOSPITAL_COMMUNITY)
Admission: RE | Admit: 2021-04-09 | Discharge: 2021-04-09 | Disposition: A | Discharge status: Home / Self Care | Payer: Medicaid Other | Source: Ambulatory Visit | Attending: Physician Assistant | Admitting: Physician Assistant

## 2021-04-09 ENCOUNTER — Other Ambulatory Visit: Payer: Self-pay

## 2021-04-09 DIAGNOSIS — Z8719 Personal history of other diseases of the digestive system: Secondary | ICD-10-CM

## 2021-04-09 DIAGNOSIS — R935 Abnormal findings on diagnostic imaging of other abdominal regions, including retroperitoneum: Secondary | ICD-10-CM

## 2021-04-09 DIAGNOSIS — K869 Disease of pancreas, unspecified: Secondary | ICD-10-CM

## 2021-04-09 MED ORDER — GADOBUTROL 1 MMOL/ML IV SOLN
10.0000 mL | Freq: Once | INTRAVENOUS | Status: AC | PRN
  Administered 2021-04-09: 10 mL via INTRAVENOUS

## 2021-06-02 ENCOUNTER — Ambulatory Visit (AMBULATORY_SURGERY_CENTER): Payer: Medicaid Other | Admitting: Gastroenterology

## 2021-06-02 ENCOUNTER — Encounter: Payer: Self-pay | Admitting: Gastroenterology

## 2021-06-02 ENCOUNTER — Other Ambulatory Visit: Payer: Self-pay

## 2021-06-02 VITALS — BP 127/72 | HR 63 | Temp 98.4°F | Resp 11 | Ht 72.0 in | Wt 201.0 lb

## 2021-06-02 DIAGNOSIS — Z1211 Encounter for screening for malignant neoplasm of colon: Secondary | ICD-10-CM | POA: Diagnosis not present

## 2021-06-02 DIAGNOSIS — D214 Benign neoplasm of connective and other soft tissue of abdomen: Secondary | ICD-10-CM | POA: Diagnosis not present

## 2021-06-02 DIAGNOSIS — K635 Polyp of colon: Secondary | ICD-10-CM

## 2021-06-02 DIAGNOSIS — D120 Benign neoplasm of cecum: Secondary | ICD-10-CM

## 2021-06-02 DIAGNOSIS — D123 Benign neoplasm of transverse colon: Secondary | ICD-10-CM

## 2021-06-02 MED ORDER — SODIUM CHLORIDE 0.9 % IV SOLN: 500.0000 mL | Freq: Once | INTRAVENOUS | Status: DC

## 2021-06-02 NOTE — Progress Notes (Signed)
Called to room to assist during endoscopic procedure.  Patient ID and intended procedure confirmed with present staff. Received instructions for my participation in the procedure from the performing physician.  

## 2021-06-02 NOTE — Op Note (Signed)
[facility] Patient Name: Max Ayers Procedure Date: 06/02/2021 2:26 PM MRN: [id number] Endoscopist: Oien Britain , MD Age: 50 Referring MD:  Date of Birth: Aug 10, 1971 Gender: Male Account #: [id number] Procedure:                Colonoscopy Indications:              Screening for colorectal malignant neoplasm, This                            is the patient's first colonoscopy Medicines:                Monitored Anesthesia Care Procedure:                Pre-Anesthesia Assessment:                           - Prior to the procedure, a History and Physical                            was performed, and patient medications and                            allergies were reviewed. The patient's tolerance of                            previous anesthesia was also reviewed. The risks                            and benefits of the procedure and the sedation                            options and risks were discussed with the patient.                            All questions were answered, and informed consent                            was obtained. Prior Anticoagulants: The patient has                            taken no previous anticoagulant or antiplatelet                            agents. ASA Grade Assessment: II - A patient with                            mild systemic disease. After reviewing the risks                            and benefits, the patient was deemed in                            satisfactory condition to undergo the procedure.  After obtaining informed consent, the colonoscope                            was passed under direct vision. Throughout the                            procedure, the patient's blood pressure, pulse, and                            oxygen saturations were monitored continuously. The                            Olympus CF-HQ190L [id number]) Colonoscope was                            introduced through the anus  and advanced to the 5                            cm into the ileum. The colonoscopy was performed                            without difficulty. The patient tolerated the                            procedure. The quality of the bowel preparation was                            good. The ileocecal valve, appendiceal orifice, and                            rectum were photographed. Scope In: 2:50:07 PM Scope Out: 3:03:23 PM Scope Withdrawal Time: 0 hours 9 minutes 58 seconds  Total Procedure Duration: 0 hours 13 minutes 16 seconds  Findings:                 The digital rectal exam findings include                            hemorrhoids. Pertinent negatives include no                            palpable rectal lesions.                           Two sessile polyps were found in the transverse                            colon and cecum. The polyps were 2 to 7 mm in size.                            These polyps were removed with a cold snare.                            Resection and retrieval were complete.  Multiple small-mouthed diverticula were found in                            the ascending colon and cecum.                           Normal mucosa was found in the entire colon                            otherwise.                           Non-bleeding non-thrombosed internal hemorrhoids                            were found during retroflexion, during perianal                            exam and during digital exam. The hemorrhoids were                            Grade II (internal hemorrhoids that prolapse but                            reduce spontaneously). Complications:            No immediate complications. Estimated Blood Loss:     Estimated blood loss was minimal. Impression:               - Hemorrhoids found on digital rectal exam.                           - The examined portion of the ileum was normal.                           - Two 2 to 7 mm polyps  in the transverse colon and                            in the cecum, removed with a cold snare. Resected                            and retrieved.                           - Diverticulosis in the ascending colon and in the                            cecum.                           - Normal mucosa in the entire examined colon                            otherwise.                           -  Non-bleeding non-thrombosed internal hemorrhoids. Recommendation:           - The patient will be observed post-procedure,                            until all discharge criteria are met.                           - Discharge patient to home.                           - Patient has a contact number available for                            emergencies. The signs and symptoms of potential                            delayed complications were discussed with the                            patient. Return to normal activities tomorrow.                            Written discharge instructions were provided to the                            patient.                           - High fiber diet.                           - Use FiberCon 1-2 tablets PO daily.                           - Continue present medications.                           - Await pathology results.                           - Repeat colonoscopy in 04/17/09 years for                            surveillance based on pathology results.                           - The findings and recommendations were discussed                            with the patient.                           - The findings and recommendations were discussed                            with the patient's family. Grissinger Britain, MD  06/02/2021 3:08:20 PM

## 2021-06-02 NOTE — Patient Instructions (Signed)
Handouts given for polyps, diverticulosis, hemorrhoids and high fiber diet.  Use FiberCon 1-2 tablets daily, drink lots of water with it.  YOU HAD AN ENDOSCOPIC PROCEDURE TODAY AT [facility]:   Refer to the procedure report that was given to you for any specific questions about what was found during the examination.  If the procedure report does not answer your questions, please call your gastroenterologist to clarify.  If you requested that your care partner not be given the details of your procedure findings, then the procedure report has been included in a sealed envelope for you to review at your convenience later.  YOU SHOULD EXPECT: Some feelings of bloating in the abdomen. Passage of more gas than usual.  Walking can help get rid of the air that was put into your GI tract during the procedure and reduce the bloating. If you had a lower endoscopy (such as a colonoscopy or flexible sigmoidoscopy) you may notice spotting of blood in your stool or on the toilet paper. If you underwent a bowel prep for your procedure, you may not have a normal bowel movement for a few days.  Please Note:  You might notice some irritation and congestion in your nose or some drainage.  This is from the oxygen used during your procedure.  There is no need for concern and it should clear up in a day or so.  SYMPTOMS TO REPORT IMMEDIATELY:  Following lower endoscopy (colonoscopy or flexible sigmoidoscopy):  Excessive amounts of blood in the stool  Significant tenderness or worsening of abdominal pains  Swelling of the abdomen that is new, acute  Fever of 100F or higher  For urgent or emergent issues, a gastroenterologist can be reached at any hour by calling [phone]. Do not use MyChart messaging for urgent concerns.    DIET:  We do recommend a small meal at first, but then you may proceed to your regular diet.  Drink plenty of fluids but you should avoid alcoholic beverages for 24  hours.  ACTIVITY:  You should plan to take it easy for the rest of today and you should NOT DRIVE or use heavy machinery until tomorrow (because of the sedation medicines used during the test).    FOLLOW UP: Our staff will call the number listed on your records 48-72 hours following your procedure to check on you and address any questions or concerns that you may have regarding the information given to you following your procedure. If we do not reach you, we will leave a message.  We will attempt to reach you two times.  During this call, we will ask if you have developed any symptoms of COVID 19. If you develop any symptoms (ie: fever, flu-like symptoms, shortness of breath, cough etc.) before then, please call [phone].  If you test positive for Covid 19 in the 2 weeks post procedure, please call and report this information to [place].    If any biopsies were taken you will be contacted by phone or by letter within the next 1-3 weeks.  Please call us at [phone] if you have not heard about the biopsies in 3 weeks.    SIGNATURES/CONFIDENTIALITY: You and/or your care partner have signed paperwork which will be entered into your electronic medical record.  These signatures attest to the fact that that the information above on your After Visit Summary has been reviewed and is understood.  Full responsibility of the confidentiality of this discharge information lies with you  and/or your care-partner.

## 2021-06-02 NOTE — Progress Notes (Signed)
VS taken by C.W. 

## 2021-06-02 NOTE — Progress Notes (Signed)
A/ox3, pleased with MAC, report to RN 

## 2021-06-04 ENCOUNTER — Telehealth: Payer: Self-pay

## 2021-06-04 NOTE — Telephone Encounter (Signed)
  Follow up Call-  Call back number 06/02/2021  Post procedure Call Back phone  # [phone]  Permission to leave phone message Yes  Some recent data might be hidden     Patient questions:  Do you have a fever, pain , or abdominal swelling? No. Pain Score  0 *  Have you tolerated food without any problems? Yes.    Have you been able to return to your normal activities? Yes.    Do you have any questions about your discharge instructions: Diet   No. Medications  No. Follow up visit  No.  Do you have questions or concerns about your Care? No.  Actions: * If pain score is 4 or above: No action needed, pain <4.  Have you developed a fever since your procedure? no  2.   Have you had an respiratory symptoms (SOB or cough) since your procedure? no  3.   Have you tested positive for COVID 19 since your procedure no  4.   Have you had any family members/close contacts diagnosed with the COVID 19 since your procedure?  no   If yes to any of these questions please route to Joylene John, RN and Joella Prince, RN

## 2021-06-17 ENCOUNTER — Encounter: Payer: Self-pay | Admitting: Gastroenterology

## 2021-09-17 ENCOUNTER — Encounter (HOSPITAL_BASED_OUTPATIENT_CLINIC_OR_DEPARTMENT_OTHER): Payer: Self-pay | Admitting: Emergency Medicine

## 2021-09-17 ENCOUNTER — Inpatient Hospital Stay (HOSPITAL_BASED_OUTPATIENT_CLINIC_OR_DEPARTMENT_OTHER)
Admission: EM | Admit: 2021-09-17 | Discharge: 2021-09-20 | DRG: 439 | Disposition: A | Discharge status: Home / Self Care | Payer: Medicaid Other | Attending: Internal Medicine | Admitting: Internal Medicine

## 2021-09-17 ENCOUNTER — Other Ambulatory Visit: Payer: Self-pay

## 2021-09-17 ENCOUNTER — Emergency Department (HOSPITAL_BASED_OUTPATIENT_CLINIC_OR_DEPARTMENT_OTHER): Payer: Medicaid Other

## 2021-09-17 DIAGNOSIS — I10 Essential (primary) hypertension: Secondary | ICD-10-CM | POA: Diagnosis present

## 2021-09-17 DIAGNOSIS — K863 Pseudocyst of pancreas: Secondary | ICD-10-CM | POA: Diagnosis not present

## 2021-09-17 DIAGNOSIS — K852 Alcohol induced acute pancreatitis without necrosis or infection: Principal | ICD-10-CM | POA: Diagnosis present

## 2021-09-17 DIAGNOSIS — K859 Acute pancreatitis without necrosis or infection, unspecified: Secondary | ICD-10-CM | POA: Diagnosis present

## 2021-09-17 DIAGNOSIS — Z87891 Personal history of nicotine dependence: Secondary | ICD-10-CM

## 2021-09-17 DIAGNOSIS — E663 Overweight: Secondary | ICD-10-CM | POA: Diagnosis present

## 2021-09-17 DIAGNOSIS — K5900 Constipation, unspecified: Secondary | ICD-10-CM | POA: Diagnosis present

## 2021-09-17 DIAGNOSIS — Z79899 Other long term (current) drug therapy: Secondary | ICD-10-CM

## 2021-09-17 DIAGNOSIS — Z6828 Body mass index (BMI) 28.0-28.9, adult: Secondary | ICD-10-CM | POA: Diagnosis not present

## 2021-09-17 DIAGNOSIS — R739 Hyperglycemia, unspecified: Secondary | ICD-10-CM | POA: Diagnosis not present

## 2021-09-17 DIAGNOSIS — I700 Atherosclerosis of aorta: Secondary | ICD-10-CM | POA: Diagnosis present

## 2021-09-17 DIAGNOSIS — Z20822 Contact with and (suspected) exposure to covid-19: Secondary | ICD-10-CM | POA: Diagnosis present

## 2021-09-17 DIAGNOSIS — F1011 Alcohol abuse, in remission: Secondary | ICD-10-CM | POA: Diagnosis present

## 2021-09-17 DIAGNOSIS — K5730 Diverticulosis of large intestine without perforation or abscess without bleeding: Secondary | ICD-10-CM | POA: Diagnosis present

## 2021-09-17 DIAGNOSIS — R001 Bradycardia, unspecified: Secondary | ICD-10-CM | POA: Diagnosis present

## 2021-09-17 DIAGNOSIS — F1010 Alcohol abuse, uncomplicated: Secondary | ICD-10-CM | POA: Diagnosis not present

## 2021-09-17 DIAGNOSIS — Z91030 Bee allergy status: Secondary | ICD-10-CM | POA: Diagnosis not present

## 2021-09-17 DIAGNOSIS — Z8249 Family history of ischemic heart disease and other diseases of the circulatory system: Secondary | ICD-10-CM | POA: Diagnosis not present

## 2021-09-17 LAB — RESP PANEL BY RT-PCR (FLU A&B, COVID) ARPGX2
Influenza A by PCR: NEGATIVE
Influenza B by PCR: NEGATIVE
SARS Coronavirus 2 by RT PCR: NEGATIVE

## 2021-09-17 LAB — CBC WITH DIFFERENTIAL/PLATELET
Abs Immature Granulocytes: 0.01 10*3/uL (ref 0.00–0.07)
Basophils Absolute: 0 10*3/uL (ref 0.0–0.1)
Basophils Relative: 1 %
Eosinophils Absolute: 0.3 10*3/uL (ref 0.0–0.5)
Eosinophils Relative: 5 %
HCT: 41.2 % (ref 39.0–52.0)
Hemoglobin: 14.1 g/dL (ref 13.0–17.0)
Immature Granulocytes: 0 %
Lymphocytes Relative: 30 %
Lymphs Abs: 2 10*3/uL (ref 0.7–4.0)
MCH: 30.3 pg (ref 26.0–34.0)
MCHC: 34.2 g/dL (ref 30.0–36.0)
MCV: 88.4 fL (ref 80.0–100.0)
Monocytes Absolute: 0.7 10*3/uL (ref 0.1–1.0)
Monocytes Relative: 11 %
Neutro Abs: 3.5 10*3/uL (ref 1.7–7.7)
Neutrophils Relative %: 53 %
Platelets: 236 10*3/uL (ref 150–400)
RBC: 4.66 MIL/uL (ref 4.22–5.81)
RDW: 13.2 % (ref 11.5–15.5)
WBC: 6.5 10*3/uL (ref 4.0–10.5)
nRBC: 0 % (ref 0.0–0.2)

## 2021-09-17 LAB — COMPREHENSIVE METABOLIC PANEL
ALT: 21 U/L (ref 0–44)
AST: 20 U/L (ref 15–41)
Albumin: 3.8 g/dL (ref 3.5–5.0)
Alkaline Phosphatase: 73 U/L (ref 38–126)
Anion gap: 6 (ref 5–15)
BUN: 14 mg/dL (ref 6–20)
CO2: 24 mmol/L (ref 22–32)
Calcium: 8.7 mg/dL — ABNORMAL LOW (ref 8.9–10.3)
Chloride: 107 mmol/L (ref 98–111)
Creatinine, Ser: 0.98 mg/dL (ref 0.61–1.24)
GFR, Estimated: >60 mL/min (ref >60)
Glucose, Bld: 119 mg/dL — ABNORMAL HIGH (ref 70–99)
Potassium: 3.8 mmol/L (ref 3.5–5.1)
Sodium: 137 mmol/L (ref 135–145)
Total Bilirubin: 0.4 mg/dL (ref 0.3–1.2)
Total Protein: 7.6 g/dL (ref 6.5–8.1)

## 2021-09-17 LAB — LIPASE, BLOOD: Lipase: 1507 U/L — ABNORMAL HIGH (ref 11–51)

## 2021-09-17 MED ORDER — ACETAMINOPHEN 650 MG RE SUPP: 650.0000 mg | Freq: Four times a day (QID) | RECTAL | Status: DC | PRN

## 2021-09-17 MED ORDER — PANTOPRAZOLE SODIUM 40 MG IV SOLR
40.0000 mg | INTRAVENOUS | Status: DC
  Administered 2021-09-17 – 2021-09-18 (×2): 40 mg via INTRAVENOUS
  Filled 2021-09-17 (×2): qty 40

## 2021-09-17 MED ORDER — HYDROMORPHONE HCL 1 MG/ML IJ SOLN
1.0000 mg | INTRAMUSCULAR | Status: DC | PRN
  Administered 2021-09-17 (×7): 1 mg via INTRAVENOUS
  Filled 2021-09-17 (×7): qty 1

## 2021-09-17 MED ORDER — IOHEXOL 300 MG/ML  SOLN
100.0000 mL | Freq: Once | INTRAMUSCULAR | Status: AC | PRN
  Administered 2021-09-17: 100 mL via INTRAVENOUS

## 2021-09-17 MED ORDER — ONDANSETRON HCL 4 MG PO TABS
4.0000 mg | ORAL_TABLET | Freq: Four times a day (QID) | ORAL | Status: DC | PRN
  Administered 2021-09-19: 4 mg via ORAL
  Filled 2021-09-17 (×2): qty 1

## 2021-09-17 MED ORDER — SODIUM CHLORIDE 0.9 % IV SOLN: Freq: Once | INTRAVENOUS | Status: AC

## 2021-09-17 MED ORDER — LACTATED RINGERS IV BOLUS
2000.0000 mL | Freq: Once | INTRAVENOUS | Status: AC
  Administered 2021-09-17: 2000 mL via INTRAVENOUS

## 2021-09-17 MED ORDER — ONDANSETRON HCL 4 MG/2ML IJ SOLN
4.0000 mg | Freq: Four times a day (QID) | INTRAMUSCULAR | Status: DC | PRN
  Administered 2021-09-17 – 2021-09-18 (×2): 4 mg via INTRAVENOUS
  Filled 2021-09-17: qty 2

## 2021-09-17 MED ORDER — ACETAMINOPHEN 325 MG PO TABS
650.0000 mg | ORAL_TABLET | Freq: Four times a day (QID) | ORAL | Status: DC | PRN
  Administered 2021-09-18: 650 mg via ORAL
  Filled 2021-09-17: qty 2

## 2021-09-17 MED ORDER — LACTATED RINGERS IV SOLN: INTRAVENOUS | Status: DC

## 2021-09-17 MED ORDER — ONDANSETRON HCL 4 MG/2ML IJ SOLN
4.0000 mg | Freq: Once | INTRAMUSCULAR | Status: AC
  Administered 2021-09-17: 4 mg via INTRAVENOUS
  Filled 2021-09-17: qty 2

## 2021-09-17 MED ORDER — AMLODIPINE BESYLATE 5 MG PO TABS
5.0000 mg | ORAL_TABLET | Freq: Every day | ORAL | Status: DC
  Administered 2021-09-17 – 2021-09-20 (×4): 5 mg via ORAL
  Filled 2021-09-17 (×4): qty 1

## 2021-09-17 MED ORDER — ENOXAPARIN SODIUM 40 MG/0.4ML IJ SOSY
40.0000 mg | PREFILLED_SYRINGE | INTRAMUSCULAR | Status: DC
  Administered 2021-09-17: 40 mg via SUBCUTANEOUS
  Filled 2021-09-17: qty 0.4

## 2021-09-17 NOTE — ED Provider Notes (Signed)
[place] [facility] Provider Note: Georgena Spurling, MD, FACEP  CSN: [id number] MRN: [id number] ARRIVAL: 09/17/21 at [place]: [place]  Abdominal Pain   HISTORY OF PRESENT ILLNESS  09/17/21 5:52 AM Max Ayers is a 50 y.o. male with a history of pancreatitis.  He is here with epigastric abdominal pain that began 2 days ago and is gradually worsened.  He states the pain is currently a 10 out of 10, worse with movement or palpation.  He has taken ibuprofen without adequate relief.  He describes the pain is like prior pancreatitis.  He denies any recent alcohol intake.  He has had nausea but no vomiting.  He has had constipation but no diarrhea.   Past Medical History:  Diagnosis Date   Hypertension    Pancreatitis     Past Surgical History:  Procedure Laterality Date   NO PAST SURGERIES      Family History  Problem Relation Age of Onset   Diabetes Mother    Hypertension Father    Breast cancer Maternal Grandmother    Colon cancer Neg Hx    Stomach cancer Neg Hx    Liver disease Neg Hx    Pancreatic cancer Neg Hx    Esophageal cancer Neg Hx    Rectal cancer Neg Hx     Social History   Tobacco Use   Smoking status: Former    Packs/day: 1.00    Types: Cigarettes, Cigars   Smokeless tobacco: Never  Vaping Use   Vaping Use: Every day  Substance Use Topics   Alcohol use: Yes    Comment: occ   Drug use: No    Prior to Admission medications   Medication Sig Start Date End Date Taking? Authorizing Provider  Hyprom-Naphaz-Polysorb-Zn Sulf (CLEAR EYES COMPLETE OP) Place 1 drop into both eyes daily.   Yes [provider]  ibuprofen (ADVIL) 200 MG tablet Take 800 mg by mouth every 6 (six) hours as needed for moderate pain.   Yes [provider]  EPINEPHrine 0.3 mg/0.3 mL IJ SOAJ injection Inject 0.3 mg into the muscle daily as needed for anaphylaxis (call 911). 06/11/19   [provider]  Multiple Vitamin (MULTIVITAMIN)  tablet Take 1 tablet by mouth daily. Patient not taking: No sig reported    [provider]    Allergies Yellow jacket venom [bee venom]   REVIEW OF SYSTEMS  Negative except as noted here or in the History of Present Illness.   PHYSICAL EXAMINATION  Initial Vital Signs Blood pressure (!) 201/104, pulse 72, temperature 98.1 F (36.7 C), temperature source Oral, resp. rate 17, height 5\' 11"  (1.803 m), weight 93 kg, SpO2 99 %.  Examination General: Well-developed, well-nourished male in no acute distress; appearance consistent with age of record HENT: normocephalic; atraumatic Eyes: pupils equal, round and reactive to light; extraocular muscles intact Neck: supple Heart: regular rate and rhythm Lungs: clear to auscultation bilaterally Abdomen: soft; nondistended; epigastric tenderness; bowel sounds present Extremities: No deformity; full range of motion; pulses normal Neurologic: Awake, alert and oriented; motor function intact in all extremities and symmetric; no facial droop Skin: Warm and dry Psychiatric: Grimacing   RESULTS  Summary of this visit's results, reviewed and interpreted by myself:   EKG Interpretation  Date/Time:    Ventricular Rate:    PR Interval:    QRS Duration:   QT Interval:    QTC Calculation:   R Axis:     Text Interpretation:  Laboratory Studies: Results for orders placed or performed during the hospital encounter of 09/17/21 (from the past 24 hour(s))  CBC with Differential     Status: None   Collection Time: 09/17/21  5:40 AM  Result Value Ref Range   WBC 6.5 4.0 - 10.5 K/uL   RBC 4.66 4.22 - 5.81 MIL/uL   Hemoglobin 14.1 13.0 - 17.0 g/dL   HCT 41.2 39.0 - 52.0 %   MCV 88.4 80.0 - 100.0 fL   MCH 30.3 26.0 - 34.0 pg   MCHC 34.2 30.0 - 36.0 g/dL   RDW 13.2 11.5 - 15.5 %   Platelets 236 150 - 400 K/uL   nRBC 0.0 0.0 - 0.2 %   Neutrophils Relative % 53 %   Neutro Abs 3.5 1.7 - 7.7 K/uL   Lymphocytes Relative 30 %    Lymphs Abs 2.0 0.7 - 4.0 K/uL   Monocytes Relative 11 %   Monocytes Absolute 0.7 0.1 - 1.0 K/uL   Eosinophils Relative 5 %   Eosinophils Absolute 0.3 0.0 - 0.5 K/uL   Basophils Relative 1 %   Basophils Absolute 0.0 0.0 - 0.1 K/uL   Immature Granulocytes 0 %   Abs Immature Granulocytes 0.01 0.00 - 0.07 K/uL  Comprehensive metabolic panel     Status: Abnormal   Collection Time: 09/17/21  5:40 AM  Result Value Ref Range   Sodium 137 135 - 145 mmol/L   Potassium 3.8 3.5 - 5.1 mmol/L   Chloride 107 98 - 111 mmol/L   CO2 24 22 - 32 mmol/L   Glucose, Bld 119 (H) 70 - 99 mg/dL   BUN 14 6 - 20 mg/dL   Creatinine, Ser 0.98 0.61 - 1.24 mg/dL   Calcium 8.7 (L) 8.9 - 10.3 mg/dL   Total Protein 7.6 6.5 - 8.1 g/dL   Albumin 3.8 3.5 - 5.0 g/dL   AST 20 15 - 41 U/L   ALT 21 0 - 44 U/L   Alkaline Phosphatase 73 38 - 126 U/L   Total Bilirubin 0.4 0.3 - 1.2 mg/dL   GFR, Estimated >60 >60 mL/min   Anion gap 6 5 - 15  Lipase, blood     Status: Abnormal   Collection Time: 09/17/21  5:40 AM  Result Value Ref Range   Lipase 1,507 (H) 11 - 51 U/L  Resp Panel by RT-PCR (Flu A&B, Covid) Nasopharyngeal Swab     Status: None   Collection Time: 09/17/21  8:33 AM   Specimen: Nasopharyngeal Swab; Nasopharyngeal(NP) swabs in vial transport medium  Result Value Ref Range   SARS Coronavirus 2 by RT PCR NEGATIVE NEGATIVE   Influenza A by PCR NEGATIVE NEGATIVE   Influenza B by PCR NEGATIVE NEGATIVE   Imaging Studies: CT ABDOMEN PELVIS W CONTRAST  Result Date: 09/17/2021 CLINICAL DATA:  50 year old male with history of abdominal pain. Suspected acute pancreatitis. EXAM: CT ABDOMEN AND PELVIS WITH CONTRAST TECHNIQUE: Multidetector CT imaging of the abdomen and pelvis was performed using the standard protocol following bolus administration of intravenous contrast. CONTRAST:  149mL OMNIPAQUE IOHEXOL 300 MG/ML  SOLN COMPARISON:  CT the abdomen and pelvis 12/12/2020. Abdominal MRI 04/09/2021. FINDINGS: Lower chest:  Unremarkable. Hepatobiliary: No suspicious cystic or solid hepatic lesions. No intra or extrahepatic biliary ductal dilatation. Gallbladder is normal in appearance. 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 Today's study demonstrates new inflammatory changes around the entire pancreas, compatible with suspected pancreatitis. No well-defined pancreatic or peripancreatic fluid collections are otherwise noted to suggest new pancreatic pseudocyst. Pancreatic parenchyma appears to enhance normally. No pancreatic ductal dilatation. Spleen: Unremarkable. Adrenals/Urinary Tract: Bilateral kidneys and bilateral adrenal glands are normal in appearance. No hydroureteronephrosis. Urinary bladder is normal in appearance. Stomach/Bowel: The appearance of the stomach is normal. No pathologic dilatation of small bowel or colon. Inflammatory changes are noted surrounding the duodenum, presumably reactive from the adjacent pancreatitis. Numerous colonic diverticulae are noted, without surrounding inflammatory changes to suggest an acute diverticulitis at this time. Normal appendix. Vascular/Lymphatic: Aortic atherosclerosis. Several prominent borderline enlarged retroperitoneal and upper abdominal lymph nodes are noted, most evident in the peripancreatic region, likely reactive. No other pathologically enlarged lymph nodes are noted elsewhere in the abdomen or pelvis. Reproductive: Prostate gland and seminal vesicles are unremarkable in appearance. Other: No significant volume of ascites.  No pneumoperitoneum. Musculoskeletal: There are no aggressive appearing lytic or blastic lesions noted in the visualized portions of the skeleton. IMPRESSION: 1. Findings are compatible with acute pancreatitis, as  above. No evidence of pancreatic necrosis. 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 No new pancreatic or peripancreatic pseudocyst identified on today's examination. 2. Colonic diverticulosis without evidence of acute diverticulitis at this time. 3. Aortic atherosclerosis. 4. Additional incidental findings, as above. Electronically Signed   By: Vinnie Langton M.D.   On: 09/17/2021 07:57    ED COURSE and MDM  Nursing notes, initial and subsequent vitals signs, including pulse oximetry, reviewed and interpreted by myself.  Vitals:   09/17/21 1005 09/17/21 1120 09/17/21 1520 09/17/21 1930  BP:  140/70 (!) 161/84 (!) 146/78  Pulse:  70 61 90  Resp:  16 17 18   Temp: 97.7 F (36.5 C) 98.6 F (37 C) 98.8 F (37.1 C) 99.3 F (37.4 C)  TempSrc: Oral Oral Oral Oral  SpO2:  99% 96% 95%  Weight:      Height:       Medications  HYDROmorphone (DILAUDID) injection 1 mg (1 mg Intravenous Given 09/17/21 0616)  0.9 %  sodium chloride infusion ( Intravenous New Bag/Given 09/17/21 0615)  ondansetron (ZOFRAN) injection 4 mg (4 mg Intravenous Given 09/17/21 0615)   7:00 AM CT abdomen and pelvis pending, expected findings consistent with acute pancreatitis.  Signed out to Dr. Karle Starch.   PROCEDURES  Procedures   ED DIAGNOSES     ICD-10-CM   1. Alcohol-induced acute pancreatitis without infection or necrosis  K85.20          Bronwyn Belasco, Jenny Reichmann, MD 09/17/21 2246

## 2021-09-17 NOTE — Progress Notes (Signed)
   09/17/21 1300  Mobility  Activity Ambulated in hall  Level of Assistance Standby assist, set-up cues, supervision of patient - no hands on  Assistive Device Other (Comment) (IV pole)  Distance Ambulated (ft) 370 ft  Mobility Ambulated with assistance in hallway  Mobility Response Tolerated well  Mobility performed by Mobility specialist  $Mobility charge 1 Mobility   Start: 12:25p End: 12:34p   Pt agreeable to mobilize today. Ambulated in the hall while pushing IV pole about 346ft, tolerated well. Took a rest break about halfway through. Pt mentioned he was having some nausea at this point, as well as some pain in his abdomen. Instructed the pt how to practice pursed lip breathing. Pt practiced technique for about a minute before starting the return trip. Pt left in bed with call bell at side. RN notified of session.   [place] Specialist Acute Rehab Services Office: [phone]

## 2021-09-17 NOTE — ED Provider Notes (Signed)
Care of the patient assumed at the change of shift. History of alcohol induced pancreatitis here with epigastric pain. Lipase is elevated, awaiting CT Physical Exam  BP 140/66   Pulse (!) 51   Temp 98.1 F (36.7 C) (Oral)   Resp 16   Ht 5\' 11"  (1.803 m)   Wt 93 kg   SpO2 99%   BMI 28.59 kg/m   Physical Exam  ED Course/Procedures   Clinical Course as of 09/17/21 1030  Fri Sep 17, 2021  0808 CT consistent with uncomplicated pancreatitis. Patient reports last EtOH use was about 2 weeks ago. Advised he should stop drinking altogether. Will discuss admission with Hospitalist.  [CS]  [phone] Spoke with Dr. Olevia Bowens, Hospitalist, who will admit.  [CS]    Clinical Course User Index [CS] Truddie Hidden, MD    Procedures  MDM         Truddie Hidden, MD 09/17/21 1030

## 2021-09-17 NOTE — ED Triage Notes (Signed)
Patient arrived via POV c/o abdominal pain starting on Wednesday getting progressively worse. Patient states "feels like previous pancreatitis." Patient states last medication is ibuprofen at 2000. Patient states pain as 10/10. Patient is AO x 4, VS w/ elevated BP, slow gait.

## 2021-09-17 NOTE — H&P (Signed)
History and Physical    [name] QBH:[id number] DOB: 09/13/1971 DOA: 09/17/2021  PCP: No Pcp, Per Patient (Inactive)  Patient coming from: Home.  I have personally briefly reviewed patient's old medical records in [facility]  Chief Complaint: Abdominal pain.  HPI: Max Ayers is a 50 y.o. male with medical history significant of hypertension, history of pancreatitis, alcohol abuse, tobacco abuse who is coming to the emergency department due to abdominal pain since Wednesday evening after he had a greasy steak along with mashed potatoes with butter for lunch earlier that day.  He stated he last had any pain with alcohol about 2 weeks ago.  He does not use diuretics and to his knowledge he does not have elevated triglycerides.  Denied emesis, diarrhea, melena or hematochezia.  He has been constipated for over 48 hours.  He denied fever, chills, sore throat, rhinorrhea, wheezing or hemoptysis.  No chest pain, palpitations, diaphoresis, PND, orthopnea or pitting edema of the lower extremities.  No dysuria, frequency or hematuria.  No polyuria, polydipsia, polyphagia or blurred vision.  ED Course: Initial vital signs were temperature 98.1 F, pulse 72, respirations 17, BP 201/104 mmHg O2 sat 99% on room air.  Lab work: CBC was normal.  CMP showed a glucose of 119 and calcium 8.7 mg/dL the rest of the CMP values are normal.  Lipase was 1507 units/L.  Imaging: CT abdomen/pelvis showed findings consistent with acute pancreatitis.  There was no evidence of pancreatitis necrosis.  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  There is colonic diverticulosis without evidence of acute diverticulitis this time.  Aortic atherosclerosis.  Review of Systems: As per HPI otherwise all other systems reviewed and are negative.  Past Medical History:  Diagnosis Date   Hypertension    Pancreatitis     Past Surgical History:  Procedure Laterality Date   NO PAST SURGERIES     Social History  reports that he has quit smoking. His smoking use included cigarettes and cigars. He smoked an average of 1 pack per day. He has never used smokeless tobacco. He reports current alcohol use. He reports that he does not use drugs.  Allergies  Allergen Reactions   Yellow Jacket Venom [Bee Venom] Anaphylaxis   Family History  Problem Relation Age of Onset   Diabetes Mother    Hypertension Father    Breast cancer Maternal Grandmother    Colon cancer Neg Hx    Stomach cancer Neg Hx    Liver disease Neg Hx    Pancreatic cancer Neg Hx    Esophageal cancer Neg Hx    Rectal cancer Neg Hx    Prior to Admission medications   Medication Sig Start Date End Date Taking? Authorizing Provider  EPINEPHrine 0.3 mg/0.3 mL IJ SOAJ injection Inject 0.3 mg into the muscle daily as needed for anaphylaxis (call 911). 06/11/19   [provider]  Multiple Vitamin (MULTIVITAMIN) tablet Take 1 tablet by mouth daily. Patient not taking: Reported on 06/02/2021    [provider]   Physical Exam: Vitals:   09/17/21 0930 09/17/21 1005 09/17/21 1005 09/17/21 1120  BP: (!) 160/101 (!) 156/92  140/70  Pulse: (!) 53 (!) 58  70  Resp: 10 15  16   Temp:   97.7 F (36.5 C) 98.6 F (37 C)  TempSrc:   Oral Oral  SpO2: 97% 98%  99%  Weight:  Height:       Constitutional: NAD, calm, comfortable Eyes: PERRL, lids and conjunctivae normal ENMT: Mucous membranes are moist. Posterior pharynx clear of any exudate or lesions.  Neck: normal, supple, no masses, no thyromegaly Respiratory: clear to auscultation bilaterally, no wheezing, no crackles. Normal respiratory effort. No accessory muscle use.  Cardiovascular: Regular rate and rhythm, no murmurs / rubs / gallops. No extremity edema. 2+ pedal pulses. No carotid bruits.  Abdomen: Obese, no distention.  Bowel sounds positive. Soft, positive epigastric  tenderness, no masses palpated. No hepatosplenomegaly.  Musculoskeletal: no clubbing / cyanosis. Good ROM, no contractures. Normal muscle tone.  Skin: no rashes, lesions, ulcers on very limited dermatological examination. Neurologic: CN 2-12 grossly intact. Sensation intact, DTR normal. Strength 5/5 in all 4.  Psychiatric: Normal judgment and insight. Alert and oriented x 3. Normal mood.   Labs on Admission: I have personally reviewed following labs and imaging studies  CBC: Recent Labs  Lab 09/17/21 0540  WBC 6.5  NEUTROABS 3.5  HGB 14.1  HCT 41.2  MCV 88.4  PLT 025    Basic Metabolic Panel: Recent Labs  Lab 09/17/21 0540  NA 137  K 3.8  CL 107  CO2 24  GLUCOSE 119*  BUN 14  CREATININE 0.98  CALCIUM 8.7*    GFR: Estimated Creatinine Clearance: 107.4 mL/min (by C-G formula based on SCr of 0.98 mg/dL).  Liver Function Tests: Recent Labs  Lab 09/17/21 0540  AST 20  ALT 21  ALKPHOS 73  BILITOT 0.4  PROT 7.6  ALBUMIN 3.8   Radiological Exams on Admission: CT ABDOMEN PELVIS W CONTRAST  Result Date: 09/17/2021 CLINICAL DATA:  50 year old male with history of abdominal pain. Suspected acute pancreatitis. EXAM: CT ABDOMEN AND PELVIS WITH CONTRAST TECHNIQUE: Multidetector CT imaging of the abdomen and pelvis was performed using the standard protocol following bolus administration of intravenous contrast. CONTRAST:  152mL OMNIPAQUE IOHEXOL 300 MG/ML  SOLN COMPARISON:  CT the abdomen and pelvis 12/12/2020. Abdominal MRI 04/09/2021. FINDINGS: Lower chest: Unremarkable. Hepatobiliary: No suspicious cystic or solid hepatic lesions. No intra or extrahepatic biliary ductal dilatation. Gallbladder is normal in appearance. 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 Today's study demonstrates new inflammatory changes around the entire pancreas, compatible with suspected pancreatitis. No well-defined pancreatic or peripancreatic fluid collections are otherwise noted to suggest new pancreatic pseudocyst. Pancreatic parenchyma appears to enhance normally. No pancreatic ductal dilatation. Spleen: Unremarkable. Adrenals/Urinary Tract: Bilateral kidneys and bilateral adrenal glands are normal in appearance. No hydroureteronephrosis. Urinary bladder is normal in appearance. Stomach/Bowel: The appearance of the stomach is normal. No pathologic dilatation of small bowel or colon. Inflammatory changes are noted surrounding the duodenum, presumably reactive from the adjacent pancreatitis. Numerous colonic diverticulae are noted, without surrounding inflammatory changes to suggest an acute diverticulitis at this time. Normal appendix. Vascular/Lymphatic: Aortic atherosclerosis. Several prominent borderline enlarged retroperitoneal and upper abdominal lymph nodes are noted, most evident in the peripancreatic region, likely reactive. No other pathologically enlarged lymph nodes are noted elsewhere in the abdomen or pelvis. Reproductive: Prostate gland and seminal vesicles are unremarkable in appearance. Other: No significant volume of ascites.  No pneumoperitoneum. Musculoskeletal: There are no aggressive appearing lytic or blastic lesions noted in the visualized portions of the skeleton. IMPRESSION: 1. Findings are  compatible with acute pancreatitis, as above. No evidence of pancreatic necrosis. 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 No new pancreatic or peripancreatic pseudocyst identified on today's examination. 2. Colonic diverticulosis without evidence of acute diverticulitis at this time. 3. Aortic atherosclerosis. 4. Additional incidental findings, as above. Electronically Signed   By: Vinnie Langton M.D.   On: 09/17/2021 07:57    EKG: Independently reviewed.  Pending.  Assessment/Plan Principal Problem:   Acute pancreatitis Observation/telemetry. Keep NPO.   Will advance to CLD later. Continue IV fluids. Analgesics as needed. Antiemetics as needed Protonix every 24 hours. Follow-up CMP, CBC and lipase. Complete alcohol cessation Avoid fried or fatty foods.  Active Problems:   Sinus bradycardia Asymptomatic. No obvious etiology. Increased vagal tone or meds? Continue cardiac monitoring. EKG ordered and still pending.    Benign essential HTN Multiple high readings on previous visits. He has not been on treatment in the past. Start amlodipine 5 mg p.o. daily. Monitor blood pressure and heart rate.    Aortic atherosclerosis ([place]) Advised briefly about cardiovascular risk. Check fasting lipid panel in AM. May start on a statin before the discharge. Also, believe can be followed for this as an outpatient.    Alcohol abuse No consumption in the last 2 weeks. Advised to avoid completely.    Tobacco use Stated he is no longer smoking.    Hyperglycemia Monitor glucose level. Check fasting level in the morning.   DVT prophylaxis: Lovenox SQ. Code Status:   Full code. Family Communication:   Disposition Plan:   Patient is from:  Home.  Anticipated DC to:  Home.  Anticipated DC date:  09/18/2021 or 09/19/2021.  Anticipated DC barriers: Clinical status.  Consults called:   Admission status:  Observation/telemetry.   Severity of Illness: High severity in the setting of acute pancreatitis with severe abdominal pain.  The patient will remain in the hospital for IV hydration and symptoms management.  Reubin Milan MD [facility]  How to contact the [facility] Attending or Consulting provider [place] or covering provider during after hours [place], for this patient?   Check the care team in [facility] and look for a) attending/consulting [facility] provider  listed and b) the [facility] team listed Log into [email or web address] and use [place]'s universal password to access. If you do not have the password, please contact the hospital operator. Locate the [facility] provider you are looking for under [facility] and page to a number that you can be directly reached. If you still have difficulty reaching the provider, please page the [facility] (Director on Call) for the Hospitalists listed on amion for assistance.  09/17/2021, 12:37 PM   This document was prepared using Dragon voice recognition software and may contain some unintended transcription errors.

## 2021-09-17 NOTE — Progress Notes (Signed)
[facility] transfer acceptance note.  The patient will be evaluated by an admitting [place] after arrival to the accepting facility.  The staff has been instructed to notify [facility] admissions and consults for admitting physician assignment.  In the meantime, LR bolus, analgesic, antiemetic follow-up labs and other admission orders have been placed.  Tennis Must, MD.

## 2021-09-18 DIAGNOSIS — K5900 Constipation, unspecified: Secondary | ICD-10-CM | POA: Diagnosis present

## 2021-09-18 DIAGNOSIS — Z87891 Personal history of nicotine dependence: Secondary | ICD-10-CM | POA: Diagnosis not present

## 2021-09-18 DIAGNOSIS — K859 Acute pancreatitis without necrosis or infection, unspecified: Secondary | ICD-10-CM | POA: Diagnosis present

## 2021-09-18 DIAGNOSIS — K863 Pseudocyst of pancreas: Secondary | ICD-10-CM | POA: Diagnosis present

## 2021-09-18 DIAGNOSIS — E663 Overweight: Secondary | ICD-10-CM | POA: Diagnosis present

## 2021-09-18 DIAGNOSIS — Z79899 Other long term (current) drug therapy: Secondary | ICD-10-CM | POA: Diagnosis not present

## 2021-09-18 DIAGNOSIS — Z6828 Body mass index (BMI) 28.0-28.9, adult: Secondary | ICD-10-CM | POA: Diagnosis not present

## 2021-09-18 DIAGNOSIS — K852 Alcohol induced acute pancreatitis without necrosis or infection: Secondary | ICD-10-CM | POA: Diagnosis present

## 2021-09-18 DIAGNOSIS — K5730 Diverticulosis of large intestine without perforation or abscess without bleeding: Secondary | ICD-10-CM | POA: Diagnosis present

## 2021-09-18 DIAGNOSIS — F1010 Alcohol abuse, uncomplicated: Secondary | ICD-10-CM | POA: Diagnosis present

## 2021-09-18 DIAGNOSIS — I10 Essential (primary) hypertension: Secondary | ICD-10-CM

## 2021-09-18 DIAGNOSIS — Z91030 Bee allergy status: Secondary | ICD-10-CM | POA: Diagnosis not present

## 2021-09-18 DIAGNOSIS — I700 Atherosclerosis of aorta: Secondary | ICD-10-CM | POA: Diagnosis present

## 2021-09-18 DIAGNOSIS — R739 Hyperglycemia, unspecified: Secondary | ICD-10-CM

## 2021-09-18 DIAGNOSIS — Z8249 Family history of ischemic heart disease and other diseases of the circulatory system: Secondary | ICD-10-CM | POA: Diagnosis not present

## 2021-09-18 DIAGNOSIS — Z20822 Contact with and (suspected) exposure to covid-19: Secondary | ICD-10-CM | POA: Diagnosis present

## 2021-09-18 LAB — CBC
HCT: 37.2 % — ABNORMAL LOW (ref 39.0–52.0)
Hemoglobin: 12.6 g/dL — ABNORMAL LOW (ref 13.0–17.0)
MCH: 30 pg (ref 26.0–34.0)
MCHC: 33.9 g/dL (ref 30.0–36.0)
MCV: 88.6 fL (ref 80.0–100.0)
Platelets: 212 10*3/uL (ref 150–400)
RBC: 4.2 MIL/uL — ABNORMAL LOW (ref 4.22–5.81)
RDW: 13.2 % (ref 11.5–15.5)
WBC: 5.1 10*3/uL (ref 4.0–10.5)
nRBC: 0 % (ref 0.0–0.2)

## 2021-09-18 LAB — COMPREHENSIVE METABOLIC PANEL
ALT: 17 U/L (ref 0–44)
AST: 17 U/L (ref 15–41)
Albumin: 3.4 g/dL — ABNORMAL LOW (ref 3.5–5.0)
Alkaline Phosphatase: 60 U/L (ref 38–126)
Anion gap: 7 (ref 5–15)
BUN: 7 mg/dL (ref 6–20)
CO2: 28 mmol/L (ref 22–32)
Calcium: 9.1 mg/dL (ref 8.9–10.3)
Chloride: 108 mmol/L (ref 98–111)
Creatinine, Ser: 0.82 mg/dL (ref 0.61–1.24)
GFR, Estimated: >60 mL/min (ref >60)
Glucose, Bld: 93 mg/dL (ref 70–99)
Potassium: 3.8 mmol/L (ref 3.5–5.1)
Sodium: 143 mmol/L (ref 135–145)
Total Bilirubin: 0.6 mg/dL (ref 0.3–1.2)
Total Protein: 6.6 g/dL (ref 6.5–8.1)

## 2021-09-18 LAB — LIPASE, BLOOD: Lipase: 255 U/L — ABNORMAL HIGH (ref 11–51)

## 2021-09-18 LAB — LIPID PANEL
Cholesterol: 174 mg/dL (ref 0–200)
HDL: 33 mg/dL — ABNORMAL LOW (ref >40)
LDL Cholesterol: 122 mg/dL — ABNORMAL HIGH (ref 0–99)
Total CHOL/HDL Ratio: 5.3 RATIO
Triglycerides: 95 mg/dL (ref <150)
VLDL: 19 mg/dL (ref 0–40)

## 2021-09-18 MED ORDER — OXYCODONE HCL 5 MG PO TABS
5.0000 mg | ORAL_TABLET | ORAL | Status: DC | PRN
  Administered 2021-09-18 – 2021-09-20 (×6): 5 mg via ORAL
  Filled 2021-09-18 (×6): qty 1

## 2021-09-18 MED ORDER — PANTOPRAZOLE SODIUM 40 MG PO TBEC
40.0000 mg | DELAYED_RELEASE_TABLET | Freq: Every day | ORAL | Status: DC
  Administered 2021-09-18 – 2021-09-20 (×3): 40 mg via ORAL
  Filled 2021-09-18 (×3): qty 1

## 2021-09-18 MED ORDER — HYDROMORPHONE HCL 1 MG/ML IJ SOLN
2.0000 mg | INTRAMUSCULAR | Status: DC | PRN
  Administered 2021-09-18: 2 mg via INTRAVENOUS
  Filled 2021-09-18: qty 2

## 2021-09-18 NOTE — Progress Notes (Signed)
PROGRESS NOTE    Max Ayers  WVP:[id number] DOB: 06-01-1971 DOA: 09/17/2021 PCP: No Pcp, Per Patient (Inactive)    Brief Narrative:  Max Ayers was admitted to the hospital with the working diagnosis of acute alcoholic pancreatitis.   50 year old male past medical history for hypertension, pancreatitis, alcohol abuse, tobacco abuse who presents with abdominal pain.  Reported ongoing symptoms for about 2 days, no associated nausea vomiting or diarrhea.  Because of persistent pain he presented to the hospital.  On his initial physical examination his temperature was 98.1, heart rate 72, respiratory rate 17, blood pressure 201/104, oxygen saturation 99% on room air, his lungs are clear to auscultation bilaterally, heart S1-S2, present, rhythmic, his abdomen was protuberant, tender to palpation epigastrium, no lower extremity edema.  Sodium 137, potassium 3.8, chloride 107, bicarb 24, glucose 119, BUN 14, creatinine 0.98, lipase 1507, white count 6.5, hemoglobin 14.1, hematocrit 41.2, platelets 236.  Triglycerides 95. SARS COVID-19 negative.  CT of the abdomen and pelvis with acute pancreatitis, no evidence of pancreatic necrosis.  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  Colonic diverticulosis.  EKG 53 bpm, normal axis, normal intervals, sinus rhythm, positive LVH, no significant ST segment or T wave changes.  Patient was placed n.p.o., received analgesics and antiemetics. Advance diet to clear liquids with good toleration.  Assessment & Plan:   Principal Problem:   Acute pancreatitis Active Problems:   Pancreatitis   Benign essential HTN   Alcohol abuse   Hyperglycemia   Aortic atherosclerosis (HCC)   Sinus bradycardia   Overweight with a BMI of 28.59 kg/m   Acute pancreatitis, hyperglycemia. Patient with improvement in his abdominal pain but not back to baseline. Did not tolerate well hydromorphone due to nausea, now changed to oxycodone. Continue with clear  liquids, as needed antiemetics Continue with antiacids.   Plan to continue supportive medical therapy and advance diet as tolerated.  IV fluids for volume repletion.   2.  HTN/ atherosclerosis. Continue blood pressure control with amlodipine.  Add statin therapy at discharge.   3. Alcohol and tobacco abuse. No clinical signs of alcohol withdrawal Continue with nicotine replacement.    Patient continue to be at high risk for worsening pancreatitis.   Status is: Inpatient  Remains inpatient appropriate because:Inpatient level of care appropriate due to severity of illness  Dispo: The patient is from: Home              Anticipated d/c is to: Home              Patient currently is not medically stable to d/c.   Difficult to place patient No   DVT prophylaxis: Enoxaparin  Code Status:   full  Family Communication:   No family at the bedside     Subjective: Patient is feeling better, this am with nausea but now improved, tolerating clears. Pain improved but not back to baseline.   Objective: Vitals:   09/17/21 1930 09/18/21 0020 09/18/21 0600 09/18/21 1352  BP: (!) 146/78 128/71 (!) 152/87 (!) 151/74  Pulse: 90 81 60 (!) 52  Resp: 18 18 20 20   Temp: 99.3 F (37.4 C) 98.3 F (36.8 C) 97.8 F (36.6 C) 98 F (36.7 C)  TempSrc: Oral Oral Oral   SpO2: 95% 94% 96% 96%  Weight:      Height:        Intake/Output Summary (Last 24 hours) at 09/18/2021 1516 Last data filed at 09/17/2021 2347 Gross per 24 hour  Intake  2240 ml  Output --  Net 2240 ml   Filed Weights   09/17/21 0540  Weight: 93 kg    Examination:   General: Not in pain or dyspnea, deconditioned  Neurology: Awake and alert, non focal  E ENT: no pallor, no icterus, oral mucosa moist Cardiovascular: No JVD. S1-S2 present, rhythmic, no gallops, rubs, or murmurs. No lower extremity edema. Pulmonary: positive breath sounds bilaterally, adequate air movement, no wheezing, rhonchi or rales. Gastrointestinal.  Abdomen mild distended but not tender to superficial palpation Skin. No rashes Musculoskeletal: no joint deformities     Data Reviewed: I have personally reviewed following labs and imaging studies  CBC: Recent Labs  Lab 09/17/21 0540 09/18/21 0548  WBC 6.5 5.1  NEUTROABS 3.5  --   HGB 14.1 12.6*  HCT 41.2 37.2*  MCV 88.4 88.6  PLT 236 711   Basic Metabolic Panel: Recent Labs  Lab 09/17/21 0540 09/18/21 0548  NA 137 143  K 3.8 3.8  CL 107 108  CO2 24 28  GLUCOSE 119* 93  BUN 14 7  CREATININE 0.98 0.82  CALCIUM 8.7* 9.1   GFR: Estimated Creatinine Clearance: 128.4 mL/min (by C-G formula based on SCr of 0.82 mg/dL). Liver Function Tests: Recent Labs  Lab 09/17/21 0540 09/18/21 0548  AST 20 17  ALT 21 17  ALKPHOS 73 60  BILITOT 0.4 0.6  PROT 7.6 6.6  ALBUMIN 3.8 3.4*   Recent Labs  Lab 09/17/21 0540 09/18/21 0548  LIPASE 1,507* 255*   No results for input(s): AMMONIA in the last 168 hours. Coagulation Profile: No results for input(s): INR, PROTIME in the last 168 hours. Cardiac Enzymes: No results for input(s): CKTOTAL, CKMB, CKMBINDEX, TROPONINI in the last 168 hours. BNP (last 3 results) No results for input(s): PROBNP in the last 8760 hours. HbA1C: No results for input(s): HGBA1C in the last 72 hours. CBG: No results for input(s): GLUCAP in the last 168 hours. Lipid Profile: Recent Labs    09/18/21 0548  CHOL 174  HDL 33*  LDLCALC 122*  TRIG 95  CHOLHDL 5.3   Thyroid Function Tests: No results for input(s): TSH, T4TOTAL, FREET4, T3FREE, THYROIDAB in the last 72 hours. Anemia Panel: No results for input(s): VITAMINB12, FOLATE, FERRITIN, TIBC, IRON, RETICCTPCT in the last 72 hours.    Radiology Studies: I have reviewed all of the imaging during this hospital visit personally     Scheduled Meds:  amLODipine  5 mg Oral Daily   enoxaparin (LOVENOX) injection  40 mg Subcutaneous Q24H   pantoprazole (PROTONIX) IV  40 mg Intravenous  Q24H   Continuous Infusions:  lactated ringers 125 mL/hr at 09/17/21 2347     LOS: 0 days        Seymone Forlenza Gerome Apley, MD

## 2021-09-19 LAB — BASIC METABOLIC PANEL
Anion gap: 5 (ref 5–15)
BUN: 6 mg/dL (ref 6–20)
CO2: 29 mmol/L (ref 22–32)
Calcium: 8.8 mg/dL — ABNORMAL LOW (ref 8.9–10.3)
Chloride: 103 mmol/L (ref 98–111)
Creatinine, Ser: 1.07 mg/dL (ref 0.61–1.24)
GFR, Estimated: >60 mL/min (ref >60)
Glucose, Bld: 101 mg/dL — ABNORMAL HIGH (ref 70–99)
Potassium: 3.7 mmol/L (ref 3.5–5.1)
Sodium: 137 mmol/L (ref 135–145)

## 2021-09-19 LAB — MAGNESIUM: Magnesium: 1.8 mg/dL (ref 1.7–2.4)

## 2021-09-19 MED ORDER — POLYETHYLENE GLYCOL 3350 17 G PO PACK
17.0000 g | PACK | Freq: Every day | ORAL | Status: DC
  Administered 2021-09-19 – 2021-09-20 (×2): 17 g via ORAL
  Filled 2021-09-19 (×3): qty 1

## 2021-09-19 MED ORDER — POLYVINYL ALCOHOL 1.4 % OP SOLN
1.0000 [drp] | OPHTHALMIC | Status: DC | PRN
  Administered 2021-09-19: 1 [drp] via OPHTHALMIC
  Filled 2021-09-19: qty 15

## 2021-09-19 NOTE — Progress Notes (Signed)
PROGRESS NOTE    Max Ayers  MWN:[id number] DOB: [birth year] DOA: 09/17/2021 PCP: No Pcp, Per Patient (Inactive)    Brief Narrative:  Max Ayers was admitted to the hospital with the working diagnosis of acute alcoholic pancreatitis.    50 year old male past medical history for hypertension, pancreatitis, alcohol abuse, tobacco abuse who presents with abdominal pain.  Reported ongoing symptoms for about 2 days, no associated nausea vomiting or diarrhea.  Because of persistent pain he presented to the hospital.  On his initial physical examination his temperature was 98.1, heart rate 72, respiratory rate 17, blood pressure 201/104, oxygen saturation 99% on room air, his lungs are clear to auscultation bilaterally, heart S1-S2, present, rhythmic, his abdomen was protuberant, tender to palpation epigastrium, no lower extremity edema.   Sodium 137, potassium 3.8, chloride 107, bicarb 24, glucose 119, BUN 14, creatinine 0.98, lipase 1507, white count 6.5, hemoglobin 14.1, hematocrit 41.2, platelets 236.  Triglycerides 95. SARS COVID-19 negative.   CT of the abdomen and pelvis with acute pancreatitis, no evidence of pancreatic necrosis.  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  Colonic diverticulosis.   EKG 53 bpm, normal axis, normal intervals, sinus rhythm, positive LVH, no significant ST segment or T wave changes.   Patient was placed n.p.o., received analgesics and antiemetics. Advance diet to clear liquids with good toleration.   Assessment & Plan:   Principal Problem:   Acute pancreatitis Active Problems:   Pancreatitis   Benign essential HTN   Alcohol abuse   Hyperglycemia   Aortic atherosclerosis (HCC)   Sinus bradycardia   Overweight with a BMI of 28.59 kg/m   Acute pancreatitis, hyperglycemia.  Improvement in abdominal pain, diet advance to soft for lunch today, no nausea or vomiting Continue pain control with oxycodone. Pantoprazole daily for antiacid  therapy.  Discontinue IV fluids, if continue to improve symptoms, plan for dc home tomorrow. Add Miralax for bowel regimen.     2.  HTN/ atherosclerosis.  On amlodipine for blood pressure control.  Plan to add statin therapy at discharge.    3. Alcohol and tobacco abuse. No signs of acute clinical alcohol withdrawal Nicotine replacement.    Status is: Inpatient  Remains inpatient appropriate because:Inpatient level of care appropriate due to severity of illness  Dispo: The patient is from: Home              Anticipated d/c is to: Home              Patient currently is not medically stable to d/c.   Difficult to place patient No  DVT prophylaxis: Enoxaparin   Code Status:    full  Family Communication:   No family at the bedside     Subjective: Patient with no nausea or vomiting, no chest pain, abdominal pain continue to improve,   Objective: Vitals:   09/18/21 0600 09/18/21 1352 09/19/21 0926 09/19/21 1325  BP: (!) 152/87 (!) 151/74 (!) 143/83 122/64  Pulse: 60 (!) 52 62 64  Resp: 20 20 16 18   Temp: 97.8 F (36.6 C) 98 F (36.7 C) 98.6 F (37 C) 98.2 F (36.8 C)  TempSrc: Oral     SpO2: 96% 96% 93% 96%  Weight:      Height:        Intake/Output Summary (Last 24 hours) at 09/19/2021 1402 Last data filed at 09/19/2021 1000 Gross per 24 hour  Intake 2960.2 ml  Output --  Net 2960.2 ml   [employer]  09/17/21 0540  Weight: 93 kg    Examination:   General: Not in pain or dyspnea.  Neurology: Awake and alert, non focal  E ENT: no pallor, no icterus, oral mucosa moist Cardiovascular: No JVD. S1-S2 present, rhythmic, no gallops, rubs, or murmurs. No lower extremity edema. Pulmonary: positive breath sounds bilaterally, adequate air movement, no wheezing, rhonchi or rales. Gastrointestinal. Abdomen mild distended but not tender Skin. No rashes Musculoskeletal: no joint deformities     Data Reviewed: I have personally reviewed following labs and imaging  studies  CBC: Recent Labs  Lab 09/17/21 0540 09/18/21 0548  WBC 6.5 5.1  NEUTROABS 3.5  --   HGB 14.1 12.6*  HCT 41.2 37.2*  MCV 88.4 88.6  PLT 236 277   Basic Metabolic Panel: Recent Labs  Lab 09/17/21 0540 09/18/21 0548 09/19/21 0524  NA 137 143 137  K 3.8 3.8 3.7  CL 107 108 103  CO2 24 28 29   GLUCOSE 119* 93 101*  BUN 14 7 6   CREATININE 0.98 0.82 1.07  CALCIUM 8.7* 9.1 8.8*  MG  --   --  1.8   GFR: Estimated Creatinine Clearance: 98.4 mL/min (by C-G formula based on SCr of 1.07 mg/dL). Liver Function Tests: Recent Labs  Lab 09/17/21 0540 09/18/21 0548  AST 20 17  ALT 21 17  ALKPHOS 73 60  BILITOT 0.4 0.6  PROT 7.6 6.6  ALBUMIN 3.8 3.4*   Recent Labs  Lab 09/17/21 0540 09/18/21 0548  LIPASE 1,507* 255*   No results for input(s): AMMONIA in the last 168 hours. Coagulation Profile: No results for input(s): INR, PROTIME in the last 168 hours. Cardiac Enzymes: No results for input(s): CKTOTAL, CKMB, CKMBINDEX, TROPONINI in the last 168 hours. BNP (last 3 results) No results for input(s): PROBNP in the last 8760 hours. HbA1C: No results for input(s): HGBA1C in the last 72 hours. CBG: No results for input(s): GLUCAP in the last 168 hours. Lipid Profile: Recent Labs    09/18/21 0548  CHOL 174  HDL 33*  LDLCALC 122*  TRIG 95  CHOLHDL 5.3   Thyroid Function Tests: No results for input(s): TSH, T4TOTAL, FREET4, T3FREE, THYROIDAB in the last 72 hours. Anemia Panel: No results for input(s): VITAMINB12, FOLATE, FERRITIN, TIBC, IRON, RETICCTPCT in the last 72 hours.    Radiology Studies: I have reviewed all of the imaging during this hospital visit personally     Scheduled Meds:  amLODipine  5 mg Oral Daily   pantoprazole  40 mg Oral Daily   Continuous Infusions:  lactated ringers 50 mL/hr at 09/18/21 1603     LOS: 1 day        Max Bluett Gerome Apley, MD

## 2021-09-19 NOTE — Plan of Care (Signed)

## 2021-09-20 MED ORDER — TRAMADOL HCL 50 MG PO TABS: 50.0000 mg | ORAL_TABLET | Freq: Three times a day (TID) | ORAL | 0 refills | Status: DC | PRN

## 2021-09-20 MED ORDER — ACETAMINOPHEN 325 MG PO TABS: 650.0000 mg | ORAL_TABLET | Freq: Four times a day (QID) | ORAL | Status: DC | PRN

## 2021-09-20 MED ORDER — TRAMADOL HCL 50 MG PO TABS
50.0000 mg | ORAL_TABLET | Freq: Three times a day (TID) | ORAL | Status: DC | PRN
Start: 2021-09-20 — End: 2021-09-20

## 2021-09-20 MED ORDER — POLYETHYLENE GLYCOL 3350 17 G PO PACK: 17.0000 g | PACK | Freq: Every day | ORAL | 0 refills | Status: DC | PRN

## 2021-09-20 MED ORDER — PANTOPRAZOLE SODIUM 40 MG PO TBEC: 40.0000 mg | DELAYED_RELEASE_TABLET | Freq: Every day | ORAL | 0 refills | Status: DC

## 2021-09-20 NOTE — Plan of Care (Signed)

## 2021-09-20 NOTE — Discharge Summary (Signed)
Physician Discharge Summary  Zackariah Vanderpol FOY:[id number] DOB: 08-21-1971 DOA: 09/17/2021  PCP: No Pcp, Per Patient (Inactive)  Admit date: 09/17/2021 Discharge date: 09/20/2021  Admitted From: Home  Disposition:  Home   Recommendations for Outpatient Follow-up and new medication changes:  Follow up with Primary Care in 7 to 10 days.  Continue as needed tramadol for pain control, not to take while working.   Home Health: no   Equipment/Devices: no    Discharge Condition: stable  CODE STATUS: full  Diet recommendation:  soft diet   Brief/Interim Summary: Mr. Danowski was admitted to the hospital with the working diagnosis of acute alcoholic pancreatitis.    50 year old male past medical history for hypertension, pancreatitis, alcohol abuse, tobacco abuse who presents with abdominal pain.  Reported ongoing symptoms for about 2 days, no associated nausea vomiting or diarrhea.  Because of persistent pain he presented to the hospital.  On his initial physical examination his temperature was 98.1, heart rate 72, respiratory rate 17, blood pressure 201/104, oxygen saturation 99% on room air, his lungs were clear to auscultation bilaterally, heart S1-S2, present, rhythmic, his abdomen was protuberant, tender to palpation epigastrium, no lower extremity edema.   Sodium 137, potassium 3.8, chloride 107, bicarb 24, glucose 119, BUN 14, creatinine 0.98, lipase 1507, white count 6.5, hemoglobin 14.1, hematocrit 41.2, platelets 236.  Triglycerides 95. SARS COVID-19 negative.   CT of the abdomen and pelvis with acute pancreatitis, no evidence of pancreatic necrosis.  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  Colonic diverticulosis.   EKG 53 bpm, normal axis, normal intervals, sinus rhythm, positive LVH, no significant ST segment or T wave changes.   Patient was placed n.p.o., received analgesics and antiemetics. Advance diet to clear liquids with good toleration. Clinically has  improved, will follow as outpatient.    Acute alcoholic pancreatitis.  Hyperglycemia.  Patient was admitted to the medical ward, he received supportive medical therapy with intravenous fluids, he was kept initially nothing by mouth and his diet was advanced slowly. Received antiacid therapy with pantoprazole.  Slowly his symptoms improved and his diet was advanced. Patient will follow-up as an outpatient.  Alcohol cessation counseling.  2. Atherosclerosis.  Patient had hypertension during hospitalization, he was placed on amlodipine with blood pressure control. I will recommend outpatient follow-up blood pressure at stable clinical state, before starting antihypertensive therapy.  His discharge blood pressure is controlled.   3.  Alcohol and tobacco abuse.  No signs of acute withdrawal.  Discharge Diagnoses:  Principal Problem:   Acute pancreatitis Active Problems:   Pancreatitis   Benign essential HTN   Alcohol abuse   Hyperglycemia   Aortic atherosclerosis (HCC)   Sinus bradycardia   Overweight with a BMI of 28.59 kg/m    Discharge Instructions   Allergies as of 09/20/2021       Reactions   Yellow Jacket Venom [bee Venom] Anaphylaxis        Medication List     STOP taking these medications    EPINEPHrine 0.3 mg/0.3 mL Soaj injection Commonly known as: EPI-PEN   ibuprofen 200 MG tablet Commonly known as: ADVIL   multivitamin tablet       TAKE these medications    acetaminophen 325 MG tablet Commonly known as: TYLENOL Take 2 tablets (650 mg total) by mouth every 6 (six) hours as needed for mild pain (or Fever >/= 101).   CLEAR EYES COMPLETE OP Place 1 drop into both eyes daily.   pantoprazole 40  MG tablet Commonly known as: PROTONIX Take 1 tablet (40 mg total) by mouth daily for 15 days. Start taking on: September 21, 2021   polyethylene glycol 17 g packet Commonly known as: MIRALAX / GLYCOLAX Take 17 g by mouth daily as needed.   traMADol 50  MG tablet Commonly known as: ULTRAM Take 1 tablet (50 mg total) by mouth 3 (three) times daily as needed for severe pain.        Allergies  Allergen Reactions   Yellow Jacket Venom [Bee Venom] Anaphylaxis      Procedures/Studies: CT ABDOMEN PELVIS W CONTRAST  Result Date: 09/17/2021 CLINICAL DATA:  50 year old male with history of abdominal pain. Suspected acute pancreatitis. EXAM: CT ABDOMEN AND PELVIS WITH CONTRAST TECHNIQUE: Multidetector CT imaging of the abdomen and pelvis was performed using the standard protocol following bolus administration of intravenous contrast. CONTRAST:  126mL OMNIPAQUE IOHEXOL 300 MG/ML  SOLN COMPARISON:  CT the abdomen and pelvis 12/12/2020. Abdominal MRI 04/09/2021. FINDINGS: Lower chest: Unremarkable. Hepatobiliary: No suspicious cystic or solid hepatic lesions. No intra or extrahepatic biliary ductal dilatation. Gallbladder is normal in appearance. 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 Today's study demonstrates new inflammatory changes around the entire pancreas, compatible with suspected pancreatitis. No well-defined pancreatic or peripancreatic fluid collections are otherwise noted to suggest new pancreatic pseudocyst. Pancreatic parenchyma appears to enhance normally. No pancreatic ductal dilatation. Spleen: Unremarkable. Adrenals/Urinary Tract: Bilateral kidneys and bilateral adrenal glands are normal in appearance. No hydroureteronephrosis. Urinary bladder is normal in appearance. Stomach/Bowel: The appearance of the stomach is normal. No pathologic dilatation of small bowel or colon. Inflammatory changes are noted surrounding the duodenum, presumably reactive from the adjacent pancreatitis. Numerous colonic diverticulae are  noted, without surrounding inflammatory changes to suggest an acute diverticulitis at this time. Normal appendix. Vascular/Lymphatic: Aortic atherosclerosis. Several prominent borderline enlarged retroperitoneal and upper abdominal lymph nodes are noted, most evident in the peripancreatic region, likely reactive. No other pathologically enlarged lymph nodes are noted elsewhere in the abdomen or pelvis. Reproductive: Prostate gland and seminal vesicles are unremarkable in appearance. Other: No significant volume of ascites.  No pneumoperitoneum. Musculoskeletal: There are no aggressive appearing lytic or blastic lesions noted in the visualized portions of the skeleton. IMPRESSION: 1. Findings are compatible with acute pancreatitis, as above. No evidence of pancreatic necrosis. 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 No new pancreatic or peripancreatic pseudocyst identified on today's examination. 2. Colonic diverticulosis without evidence of acute diverticulitis at this time. 3. Aortic atherosclerosis. 4. Additional incidental findings, as above. Electronically Signed   By: Vinnie Langton M.D.   On: 09/17/2021 07:57      Subjective: Patient is feeling better, no nausea or vomiting, no dyspnea or chest pain.  Discharge Exam: Vitals:   09/20/21 0400 09/20/21 0844  BP: (!) 141/80 131/68  Pulse: (!) 58   Resp: 16   Temp: 97.7 F (36.5 C)   SpO2: 97%    Vitals:   09/19/21 2100 09/19/21 2158 09/20/21 0400 09/20/21 0844  BP: 129/76 129/76 (!) 141/80 131/68  Pulse: 65 64 (!) 58   Resp: 16  16   Temp: 98.1 F (36.7 C) 98.1 F (36.7 C) 97.7 F (36.5 C)  TempSrc: Oral Oral Oral   SpO2: 96% 96% 97%   Weight:      Height:        General: Not in pain or dyspnea  Neurology: Awake and alert, non focal  E ENT: no pallor, no icterus, oral mucosa moist Cardiovascular: No JVD. S1-S2 present, rhythmic, no gallops, rubs, or  murmurs. No lower extremity edema. Pulmonary: vesicular breath sounds bilaterally, adequate air movement, no wheezing, rhonchi or rales. Gastrointestinal. Abdomen soft and non tender Skin. No rashes Musculoskeletal: no joint deformities   The results of significant diagnostics from this hospitalization (including imaging, microbiology, ancillary and laboratory) are listed below for reference.     Microbiology: Recent Results (from the past 240 hour(s))  Resp Panel by RT-PCR (Flu A&B, Covid) Nasopharyngeal Swab     Status: None   Collection Time: 09/17/21  8:33 AM   Specimen: Nasopharyngeal Swab; Nasopharyngeal(NP) swabs in vial transport medium  Result Value Ref Range Status   SARS Coronavirus 2 by RT PCR NEGATIVE NEGATIVE Final    Comment: (NOTE) SARS-CoV-2 target nucleic acids are NOT DETECTED.  The SARS-CoV-2 RNA is generally detectable in upper respiratory specimens during the acute phase of infection. The lowest concentration of SARS-CoV-2 viral copies this assay can detect is 138 copies/mL. A negative result does not preclude SARS-Cov-2 infection and should not be used as the sole basis for treatment or other patient management decisions. A negative result may occur with  improper specimen collection/handling, submission of specimen other than nasopharyngeal swab, presence of viral mutation(s) within the areas targeted by this assay, and inadequate number of viral copies(<138 copies/mL). A negative result must be combined with clinical observations, patient history, and epidemiological information. The expected result is Negative.  Fact Sheet for Patients:  EntrepreneurPulse.com.au  Fact Sheet for Healthcare Providers:  IncredibleEmployment.be  This test is no t yet approved or cleared by the [place] FDA and  has been authorized for detection and/or diagnosis of SARS-CoV-2 by FDA under an Emergency Use Authorization (EUA). This  EUA will remain  in effect (meaning this test can be used) for the duration of the COVID-19 declaration under Section 564(b)(1) of the Act, 21 U.S.C.section 360bbb-3(b)(1), unless the authorization is terminated  or revoked sooner.       Influenza A by PCR NEGATIVE NEGATIVE Final   Influenza B by PCR NEGATIVE NEGATIVE Final    Comment: (NOTE) The Xpert Xpress SARS-CoV-2/FLU/RSV plus assay is intended as an aid in the diagnosis of influenza from Nasopharyngeal swab specimens and should not be used as a sole basis for treatment. Nasal washings and aspirates are unacceptable for Xpert Xpress SARS-CoV-2/FLU/RSV testing.  Fact Sheet for Patients: EntrepreneurPulse.com.au  Fact Sheet for Healthcare Providers: IncredibleEmployment.be  This test is not yet approved or cleared by the [place] FDA and has been authorized for detection and/or diagnosis of SARS-CoV-2 by FDA under an Emergency Use Authorization (EUA). This EUA will remain in effect (meaning this test can be used) for the duration of the COVID-19 declaration under Section 564(b)(1) of the Act, 21 U.S.C. section 360bbb-3(b)(1), unless the authorization is terminated or revoked.  Performed at [facility], [address] [place], [place] [address]      Labs: BNP (last 3 results) No results for input(s): BNP in the last 8760 hours. Basic Metabolic Panel: Recent Labs  Lab 09/17/21 0540 09/18/21 0548 09/19/21 0524  NA 137 143 137  K 3.8 3.8 3.7  CL 107 108 103  CO2  24 28 29   GLUCOSE 119* 93 101*  BUN 14 7 6   CREATININE 0.98 0.82 1.07  CALCIUM 8.7* 9.1 8.8*  MG  --   --  1.8   Liver Function Tests: Recent Labs  Lab 09/17/21 0540 09/18/21 0548  AST 20 17  ALT 21 17  ALKPHOS 73 60  BILITOT 0.4 0.6  PROT 7.6 6.6  ALBUMIN 3.8 3.4*   Recent Labs  Lab 09/17/21 0540 09/18/21 0548  LIPASE 1,507* 255*   No results for input(s): AMMONIA in the last 168  hours. CBC: Recent Labs  Lab 09/17/21 0540 09/18/21 0548  WBC 6.5 5.1  NEUTROABS 3.5  --   HGB 14.1 12.6*  HCT 41.2 37.2*  MCV 88.4 88.6  PLT 236 212   Cardiac Enzymes: No results for input(s): CKTOTAL, CKMB, CKMBINDEX, TROPONINI in the last 168 hours. BNP: Invalid input(s): POCBNP CBG: No results for input(s): GLUCAP in the last 168 hours. D-Dimer No results for input(s): DDIMER in the last 72 hours. Hgb A1c No results for input(s): HGBA1C in the last 72 hours. Lipid Profile Recent Labs    09/18/21 0548  CHOL 174  HDL 33*  LDLCALC 122*  TRIG 95  CHOLHDL 5.3   Thyroid function studies No results for input(s): TSH, T4TOTAL, T3FREE, THYROIDAB in the last 72 hours.  Invalid input(s): FREET3 Anemia work up No results for input(s): VITAMINB12, FOLATE, FERRITIN, TIBC, IRON, RETICCTPCT in the last 72 hours. Urinalysis    Component Value Date/Time   COLORURINE YELLOW 12/12/2020 1044   APPEARANCEUR HAZY (A) 12/12/2020 1044   LABSPEC 1.026 12/12/2020 1044   PHURINE 7.0 12/12/2020 1044   GLUCOSEU NEGATIVE 12/12/2020 1044   HGBUR NEGATIVE 12/12/2020 1044   BILIRUBINUR NEGATIVE 12/12/2020 1044   KETONESUR 20 (A) 12/12/2020 1044   PROTEINUR 30 (A) 12/12/2020 1044   UROBILINOGEN 0.2 10/27/2011 0957   NITRITE NEGATIVE 12/12/2020 1044   LEUKOCYTESUR NEGATIVE 12/12/2020 1044   Sepsis Labs Invalid input(s): PROCALCITONIN,  WBC,  LACTICIDVEN Microbiology Recent Results (from the past 240 hour(s))  Resp Panel by RT-PCR (Flu A&B, Covid) Nasopharyngeal Swab     Status: None   Collection Time: 09/17/21  8:33 AM   Specimen: Nasopharyngeal Swab; Nasopharyngeal(NP) swabs in vial transport medium  Result Value Ref Range Status   SARS Coronavirus 2 by RT PCR NEGATIVE NEGATIVE Final    Comment: (NOTE) SARS-CoV-2 target nucleic acids are NOT DETECTED.  The SARS-CoV-2 RNA is generally detectable in upper respiratory specimens during the acute phase of infection. The  lowest concentration of SARS-CoV-2 viral copies this assay can detect is 138 copies/mL. A negative result does not preclude SARS-Cov-2 infection and should not be used as the sole basis for treatment or other patient management decisions. A negative result may occur with  improper specimen collection/handling, submission of specimen other than nasopharyngeal swab, presence of viral mutation(s) within the areas targeted by this assay, and inadequate number of viral copies(<138 copies/mL). A negative result must be combined with clinical observations, patient history, and epidemiological information. The expected result is Negative.  Fact Sheet for Patients:  EntrepreneurPulse.com.au  Fact Sheet for Healthcare Providers:  IncredibleEmployment.be  This test is no t yet approved or cleared by the [place] FDA and  has been authorized for detection and/or diagnosis of SARS-CoV-2 by FDA under an Emergency Use Authorization (EUA). This EUA will remain  in effect (meaning this test can be used) for the duration of the COVID-19 declaration under Section 564(b)(1) of the Act,  21 U.S.C.section 360bbb-3(b)(1), unless the authorization is terminated  or revoked sooner.       Influenza A by PCR NEGATIVE NEGATIVE Final   Influenza B by PCR NEGATIVE NEGATIVE Final    Comment: (NOTE) The Xpert Xpress SARS-CoV-2/FLU/RSV plus assay is intended as an aid in the diagnosis of influenza from Nasopharyngeal swab specimens and should not be used as a sole basis for treatment. Nasal washings and aspirates are unacceptable for Xpert Xpress SARS-CoV-2/FLU/RSV testing.  Fact Sheet for Patients: EntrepreneurPulse.com.au  Fact Sheet for Healthcare Providers: IncredibleEmployment.be  This test is not yet approved or cleared by the [place] FDA and has been authorized for detection and/or diagnosis of SARS-CoV-2 by FDA under  an Emergency Use Authorization (EUA). This EUA will remain in effect (meaning this test can be used) for the duration of the COVID-19 declaration under Section 564(b)(1) of the Act, 21 U.S.C. section 360bbb-3(b)(1), unless the authorization is terminated or revoked.  Performed at [facility], [address] [place], [place] [address]      Time coordinating discharge: 45 minutes  SIGNED:   Tawni Millers, MD  [facility] 09/20/2021, 11:43 AM

## 2021-10-08 ENCOUNTER — Inpatient Hospital Stay (HOSPITAL_COMMUNITY)
Admission: EM | Admit: 2021-10-08 | Discharge: 2021-10-11 | DRG: 440 | Disposition: A | Discharge status: Home / Self Care | Payer: Medicaid Other | Attending: Internal Medicine | Admitting: Internal Medicine

## 2021-10-08 ENCOUNTER — Emergency Department (HOSPITAL_COMMUNITY): Payer: Medicaid Other

## 2021-10-08 ENCOUNTER — Encounter (HOSPITAL_COMMUNITY): Payer: Self-pay

## 2021-10-08 ENCOUNTER — Other Ambulatory Visit: Payer: Self-pay

## 2021-10-08 DIAGNOSIS — Z87891 Personal history of nicotine dependence: Secondary | ICD-10-CM

## 2021-10-08 DIAGNOSIS — I10 Essential (primary) hypertension: Secondary | ICD-10-CM | POA: Diagnosis present

## 2021-10-08 DIAGNOSIS — Z20822 Contact with and (suspected) exposure to covid-19: Secondary | ICD-10-CM | POA: Diagnosis present

## 2021-10-08 DIAGNOSIS — Z803 Family history of malignant neoplasm of breast: Secondary | ICD-10-CM

## 2021-10-08 DIAGNOSIS — K859 Acute pancreatitis without necrosis or infection, unspecified: Principal | ICD-10-CM | POA: Diagnosis present

## 2021-10-08 DIAGNOSIS — Z833 Family history of diabetes mellitus: Secondary | ICD-10-CM

## 2021-10-08 DIAGNOSIS — K861 Other chronic pancreatitis: Secondary | ICD-10-CM | POA: Diagnosis present

## 2021-10-08 DIAGNOSIS — Z79899 Other long term (current) drug therapy: Secondary | ICD-10-CM

## 2021-10-08 DIAGNOSIS — F1091 Alcohol use, unspecified, in remission: Secondary | ICD-10-CM | POA: Diagnosis present

## 2021-10-08 DIAGNOSIS — Z8249 Family history of ischemic heart disease and other diseases of the circulatory system: Secondary | ICD-10-CM

## 2021-10-08 LAB — CBC WITH DIFFERENTIAL/PLATELET
Abs Immature Granulocytes: 0.01 10*3/uL (ref 0.00–0.07)
Basophils Absolute: 0 10*3/uL (ref 0.0–0.1)
Basophils Relative: 0 %
Eosinophils Absolute: 0.1 10*3/uL (ref 0.0–0.5)
Eosinophils Relative: 2 %
HCT: 47.5 % (ref 39.0–52.0)
Hemoglobin: 15.8 g/dL (ref 13.0–17.0)
Immature Granulocytes: 0 %
Lymphocytes Relative: 37 %
Lymphs Abs: 2.3 10*3/uL (ref 0.7–4.0)
MCH: 30 pg (ref 26.0–34.0)
MCHC: 33.3 g/dL (ref 30.0–36.0)
MCV: 90.3 fL (ref 80.0–100.0)
Monocytes Absolute: 0.5 10*3/uL (ref 0.1–1.0)
Monocytes Relative: 8 %
Neutro Abs: 3.3 10*3/uL (ref 1.7–7.7)
Neutrophils Relative %: 53 %
Platelets: 241 10*3/uL (ref 150–400)
RBC: 5.26 MIL/uL (ref 4.22–5.81)
RDW: 12.9 % (ref 11.5–15.5)
WBC: 6.1 10*3/uL (ref 4.0–10.5)
nRBC: 0 % (ref 0.0–0.2)

## 2021-10-08 LAB — COMPREHENSIVE METABOLIC PANEL
ALT: 24 U/L (ref 0–44)
AST: 24 U/L (ref 15–41)
Albumin: 4.8 g/dL (ref 3.5–5.0)
Alkaline Phosphatase: 84 U/L (ref 38–126)
Anion gap: 6 (ref 5–15)
BUN: 20 mg/dL (ref 6–20)
CO2: 28 mmol/L (ref 22–32)
Calcium: 9.4 mg/dL (ref 8.9–10.3)
Chloride: 104 mmol/L (ref 98–111)
Creatinine, Ser: 1.13 mg/dL (ref 0.61–1.24)
GFR, Estimated: >60 mL/min (ref >60)
Glucose, Bld: 103 mg/dL — ABNORMAL HIGH (ref 70–99)
Potassium: 4.2 mmol/L (ref 3.5–5.1)
Sodium: 138 mmol/L (ref 135–145)
Total Bilirubin: 0.9 mg/dL (ref 0.3–1.2)
Total Protein: 8.8 g/dL — ABNORMAL HIGH (ref 6.5–8.1)

## 2021-10-08 LAB — RESP PANEL BY RT-PCR (FLU A&B, COVID) ARPGX2
Influenza A by PCR: NEGATIVE
Influenza B by PCR: NEGATIVE
SARS Coronavirus 2 by RT PCR: NEGATIVE

## 2021-10-08 LAB — LIPID PANEL
Cholesterol: 228 mg/dL — ABNORMAL HIGH (ref 0–200)
HDL: 38 mg/dL — ABNORMAL LOW (ref >40)
LDL Cholesterol: 166 mg/dL — ABNORMAL HIGH (ref 0–99)
Total CHOL/HDL Ratio: 6 RATIO
Triglycerides: 118 mg/dL (ref <150)
VLDL: 24 mg/dL (ref 0–40)

## 2021-10-08 LAB — ETHANOL: Alcohol, Ethyl (B): <10 mg/dL (ref <10)

## 2021-10-08 LAB — LIPASE, BLOOD: Lipase: 656 U/L — ABNORMAL HIGH (ref 11–51)

## 2021-10-08 MED ORDER — SODIUM CHLORIDE 0.9 % IV BOLUS
1000.0000 mL | Freq: Once | INTRAVENOUS | Status: AC
  Administered 2021-10-08: 1000 mL via INTRAVENOUS

## 2021-10-08 MED ORDER — HYDROMORPHONE HCL 1 MG/ML IJ SOLN
1.0000 mg | Freq: Once | INTRAMUSCULAR | Status: AC
  Administered 2021-10-08: 1 mg via INTRAVENOUS
  Filled 2021-10-08: qty 1

## 2021-10-08 MED ORDER — ONDANSETRON HCL 4 MG/2ML IJ SOLN
4.0000 mg | Freq: Once | INTRAMUSCULAR | Status: AC
  Administered 2021-10-08: 4 mg via INTRAVENOUS
  Filled 2021-10-08: qty 2

## 2021-10-08 MED ORDER — SODIUM CHLORIDE 0.9 % IV SOLN: INTRAVENOUS | Status: DC

## 2021-10-08 MED ORDER — MORPHINE SULFATE (PF) 4 MG/ML IV SOLN
4.0000 mg | Freq: Once | INTRAVENOUS | Status: AC
  Administered 2021-10-08: 4 mg via INTRAVENOUS
  Filled 2021-10-08: qty 1

## 2021-10-08 MED ORDER — AMLODIPINE BESYLATE 5 MG PO TABS
5.0000 mg | ORAL_TABLET | Freq: Every day | ORAL | Status: DC
Start: 2021-10-08 — End: 2021-10-11
  Administered 2021-10-08 – 2021-10-11 (×4): 5 mg via ORAL
  Filled 2021-10-08 (×4): qty 1

## 2021-10-08 MED ORDER — OXYCODONE-ACETAMINOPHEN 7.5-325 MG PO TABS
1.0000 | ORAL_TABLET | Freq: Four times a day (QID) | ORAL | Status: DC | PRN
  Administered 2021-10-08 – 2021-10-11 (×7): 1 via ORAL
  Filled 2021-10-08 (×7): qty 1

## 2021-10-08 MED ORDER — HYDRALAZINE HCL 20 MG/ML IJ SOLN: 10.0000 mg | Freq: Three times a day (TID) | INTRAMUSCULAR | Status: DC | PRN

## 2021-10-08 MED ORDER — ENOXAPARIN SODIUM 40 MG/0.4ML IJ SOSY
40.0000 mg | PREFILLED_SYRINGE | INTRAMUSCULAR | Status: DC
  Administered 2021-10-08: 40 mg via SUBCUTANEOUS
  Filled 2021-10-08 (×2): qty 0.4

## 2021-10-08 MED ORDER — MORPHINE SULFATE (PF) 4 MG/ML IV SOLN
4.0000 mg | INTRAVENOUS | Status: DC | PRN
Start: 2021-10-08 — End: 2021-10-11
  Administered 2021-10-08 – 2021-10-09 (×5): 4 mg via INTRAVENOUS
  Filled 2021-10-08 (×5): qty 1

## 2021-10-08 MED ORDER — IOHEXOL 350 MG/ML SOLN
100.0000 mL | Freq: Once | INTRAVENOUS | Status: AC | PRN
  Administered 2021-10-08: 80 mL via INTRAVENOUS

## 2021-10-08 MED ORDER — ONDANSETRON HCL 4 MG/2ML IJ SOLN: 4.0000 mg | Freq: Four times a day (QID) | INTRAMUSCULAR | Status: DC | PRN

## 2021-10-08 NOTE — ED Triage Notes (Signed)
Pt reports with upper abdominal pain since last night and states that he thinks he may have pancreatitis. Pt states that he was admitted in early October for the same thing.

## 2021-10-08 NOTE — H&P (Signed)
History and Physical    Max Ayers DEY:[id number] DOB: [birth year] DOA: 10/08/2021  PCP: No Pcp, Per Patient (Inactive)  Patient coming from: Home  Chief Complaint: stomach pain  HPI: Max Ayers is a 50 y.o. male with medical history significant of HTN, recurrent pancreatitis. Presenting with LUQ abdomen pain. Started 2 days ago. Initially crampy, but changed to a constant, sharp pain yesterday. He was nauseous, but did not vomit. Tried ibuprofen, APAP, tamadol, and meloxicam. Nothing helped. When his symptoms continued through this morning, he decided to come to the ED. He denies any other aggravating or alleviating factors.    ED Course: Found to have an elevated lipase. CT c/w acute pancreatitis. Started on fluids and pain control. [facility] called for admission.   Review of Systems:  Denies CP, palpitations, dyspnea, V/D, fever. Reports last alcohol use was in September. Review of systems is otherwise negative for all not mentioned in HPI.   PMHx Past Medical History:  Diagnosis Date   Hypertension    Pancreatitis     PSHx Past Surgical History:  Procedure Laterality Date   NO PAST SURGERIES      SocHx  reports that he has quit smoking. His smoking use included cigarettes and cigars. He smoked an average of 1 pack per day. He has never used smokeless tobacco. He reports current alcohol use. He reports that he does not use drugs.  Allergies  Allergen Reactions   Yellow Jacket Venom [Bee Venom] Anaphylaxis    FamHx Family History  Problem Relation Age of Onset   Diabetes Mother    Hypertension Father    Breast cancer Maternal Grandmother    Colon cancer Neg Hx    Stomach cancer Neg Hx    Liver disease Neg Hx    Pancreatic cancer Neg Hx    Esophageal cancer Neg Hx    Rectal cancer Neg Hx     Prior to Admission medications   Medication Sig Start Date End Date Taking? Authorizing Provider  acetaminophen (TYLENOL) 325 MG tablet Take 2 tablets (650 mg total) by mouth  every 6 (six) hours as needed for mild pain (or Fever >/= 101). 09/20/21  Yes Arrien, Jimmy Picket, MD  pantoprazole (PROTONIX) 40 MG tablet Take 1 tablet (40 mg total) by mouth daily for 15 days. Patient taking differently: Take 40 mg by mouth daily as needed (indigestion). 09/21/21 10/08/21 Yes Arrien, Jimmy Picket, MD  polyethylene glycol (MIRALAX / GLYCOLAX) 17 g packet Take 17 g by mouth daily as needed. Patient taking differently: Take 17 g by mouth daily as needed for mild constipation. 09/20/21  Yes Arrien, Jimmy Picket, MD  traMADol (ULTRAM) 50 MG tablet Take 1 tablet (50 mg total) by mouth 3 (three) times daily as needed for severe pain. Patient not taking: Reported on 10/08/2021 09/20/21   Arrien, Jimmy Picket, MD    Physical Exam: Vitals:   10/08/21 0647 10/08/21 1130 10/08/21 1200  BP: (!) 163/88 (!) 166/79 (!) 158/97  Pulse: 69 (!) 52 (!) 57  Resp: 19 18 18   Temp: 98.3 F (36.8 C)    SpO2: 99% 100% 98%  Weight: 95.3 kg    Height: 6' (1.829 m)      General: 50 y.o. male resting in bed in NAD Eyes: PERRL, normal sclera ENMT: Nares patent w/o discharge, orophaynx clear, dentition normal, ears w/o discharge/lesions/ulcers Neck: Supple, trachea midline Cardiovascular: brady +S1, S2, no m/g/r, equal pulses throughout Respiratory: CTABL, no w/r/r, normal WOB GI: BS+, ND, RUQ/LUQ/LLQ TTP,  no masses noted, no organomegaly noted MSK: No e/c/c Skin: No rashes, bruises, ulcerations noted Neuro: A&O x 3, no focal deficits Psyc: Appropriate interaction and affect, calm/cooperative  Labs on Admission: I have personally reviewed following labs and imaging studies  CBC: Recent Labs  Lab 10/08/21 0702  WBC 6.1  NEUTROABS 3.3  HGB 15.8  HCT 47.5  MCV 90.3  PLT 751   Basic Metabolic Panel: Recent Labs  Lab 10/08/21 0702  NA 138  K 4.2  CL 104  CO2 28  GLUCOSE 103*  BUN 20  CREATININE 1.13  CALCIUM 9.4   GFR: Estimated Creatinine Clearance: 95.8  mL/min (by C-G formula based on SCr of 1.13 mg/dL). Liver Function Tests: Recent Labs  Lab 10/08/21 0702  AST 24  ALT 24  ALKPHOS 84  BILITOT 0.9  PROT 8.8*  ALBUMIN 4.8   Recent Labs  Lab 10/08/21 0702  LIPASE 656*   No results for input(s): AMMONIA in the last 168 hours. Coagulation Profile: No results for input(s): INR, PROTIME in the last 168 hours. Cardiac Enzymes: No results for input(s): CKTOTAL, CKMB, CKMBINDEX, TROPONINI in the last 168 hours. BNP (last 3 results) No results for input(s): PROBNP in the last 8760 hours. HbA1C: No results for input(s): HGBA1C in the last 72 hours. CBG: No results for input(s): GLUCAP in the last 168 hours. Lipid Profile: No results for input(s): CHOL, HDL, LDLCALC, TRIG, CHOLHDL, LDLDIRECT in the last 72 hours. Thyroid Function Tests: No results for input(s): TSH, T4TOTAL, FREET4, T3FREE, THYROIDAB in the last 72 hours. Anemia Panel: No results for input(s): VITAMINB12, FOLATE, FERRITIN, TIBC, IRON, RETICCTPCT in the last 72 hours. Urine analysis:    Component Value Date/Time   COLORURINE YELLOW 12/12/2020 1044   APPEARANCEUR HAZY (A) 12/12/2020 1044   LABSPEC 1.026 12/12/2020 1044   PHURINE 7.0 12/12/2020 1044   GLUCOSEU NEGATIVE 12/12/2020 1044   HGBUR NEGATIVE 12/12/2020 1044   BILIRUBINUR NEGATIVE 12/12/2020 1044   KETONESUR 20 (A) 12/12/2020 1044   PROTEINUR 30 (A) 12/12/2020 1044   UROBILINOGEN 0.2 10/27/2011 0957   NITRITE NEGATIVE 12/12/2020 1044   LEUKOCYTESUR NEGATIVE 12/12/2020 1044    Radiological Exams on Admission: CT ABDOMEN PELVIS W CONTRAST  Result Date: 10/08/2021 CLINICAL DATA:  Abdominal pain EXAM: CT ABDOMEN AND PELVIS WITH CONTRAST TECHNIQUE: Multidetector CT imaging of the abdomen and pelvis was performed using the standard protocol following bolus administration of intravenous contrast. CONTRAST:  63mL OMNIPAQUE IOHEXOL 350 MG/ML SOLN COMPARISON:  CT abdomen and pelvis dated September 17, 2021  FINDINGS: Lower chest: No acute abnormality. Hepatobiliary: No focal liver abnormality is seen. No gallstones, gallbladder wall thickening, or biliary dilatation. Pancreas: Inflammatory change seen about the pancreas. No peripancreatic fluid collections. No parenchymal hypoenhancement. Cystic lesion of the pancreatic head is unchanged in size compared to prior exam, measures 1.7 x 1.1 cm on series 2, image 34, likely a pseudocyst. Spleen: Normal in size without focal abnormality. Adrenals/Urinary Tract: Adrenal glands are unremarkable. Kidneys are normal, without renal calculi, focal lesion, or hydronephrosis. Bladder is unremarkable. Stomach/Bowel: Stomach is within normal limits. Appendix appears normal. No evidence of bowel wall thickening, distention, or inflammatory changes. Vascular/Lymphatic: Aortic atherosclerosis. No enlarged abdominal or pelvic lymph nodes. Reproductive: Prostate is unremarkable. Other: No abdominal wall hernia or abnormality. No abdominopelvic ascites. Musculoskeletal: No acute or significant osseous findings. IMPRESSION: 1. Peripancreatic fat stranding which appears slightly increased when compared with September 17, 2021 prior exam, likely due to recurrent acute uncomplicated pancreatitis. 2. Cystic  lesion of the pancreatic head is unchanged in size compared to prior exam and likely a pseudocyst. 3. Aortic Atherosclerosis (ICD10-I70.0). Electronically Signed   By: Yetta Glassman M.D.   On: 10/08/2021 13:09    EKG: Independently reviewed. Sinus brady, no st elevations  Assessment/Plan Acute pancreatitis     - place in obs, med-surg     - fluids, anti-emetics, pain control     - NPO except ice chips     - check lipid panel  HTN     - norvasc; PRN hydralazine  DVT prophylaxis: lovenox  Code Status: FULL  Family Communication: w/ wife at bedside  Consults called: None   Status is: Observation  The patient remains OBS appropriate and will d/c before 2 midnights.  Jonnie Finner DO [facility]  If 7PM-7AM, please contact night-coverage [email or web address]  10/08/2021, 2:07 PM

## 2021-10-08 NOTE — ED Provider Notes (Signed)
[facility] DEPT Provider Note   CSN: [id number] Arrival date & time: 10/08/21  0076     History Chief Complaint  Patient presents with   Pancreatitis    Max Ayers is a 50 y.o. male.  50 year old male with prior medical history as detailed below presents for evaluation.  Patient with significant prior history of pancreatitis.  Patient reports increased mid epigastric abdominal pain.  Patient is concerned that his pain today is suggestive of recurrent pancreatitis.  Patient reports onset of pain over the last 48 hours.  He reports some nausea with this.  He denies fever.  He took some Tylenol at home without improvement in his symptoms.  He denies any recent use of alcohol.  The history is provided by the patient.  Abdominal Pain Pain location:  Epigastric Pain quality: aching and cramping   Pain radiates to:  Back Pain severity:  Moderate Onset quality:  Gradual Duration:  2 days Timing:  Constant Progression:  Worsening Chronicity:  Recurrent Context: not alcohol use   Relieved by:  Nothing     Past Medical History:  Diagnosis Date   Hypertension    Pancreatitis     Patient Active Problem List   Diagnosis Date Noted   Acute pancreatitis 09/17/2021   Hyperglycemia 09/17/2021   Aortic atherosclerosis ([place]) 09/17/2021   Sinus bradycardia 09/17/2021   Overweight with a BMI of 28.59 kg/m 09/17/2021   Pancreatitis 12/12/2020   Benign essential HTN 12/12/2020   Alcohol abuse 12/12/2020   Tobacco use 12/12/2020    Past Surgical History:  Procedure Laterality Date   NO PAST SURGERIES         Family History  Problem Relation Age of Onset   Diabetes Mother    Hypertension Father    Breast cancer Maternal Grandmother    Colon cancer Neg Hx    Stomach cancer Neg Hx    Liver disease Neg Hx    Pancreatic cancer Neg Hx    Esophageal cancer Neg Hx    Rectal cancer Neg Hx     Social History   Tobacco Use   Smoking status:  Former    Packs/day: 1.00    Types: Cigarettes, Cigars   Smokeless tobacco: Never  Vaping Use   Vaping Use: Every day  Substance Use Topics   Alcohol use: Yes    Comment: occ   Drug use: No    Home Medications Prior to Admission medications   Medication Sig Start Date End Date Taking? Authorizing Provider  acetaminophen (TYLENOL) 325 MG tablet Take 2 tablets (650 mg total) by mouth every 6 (six) hours as needed for mild pain (or Fever >/= 101). 09/20/21   Arrien, Jimmy Picket, MD  Hyprom-Naphaz-Polysorb-Zn Sulf (CLEAR EYES COMPLETE OP) Place 1 drop into both eyes daily.    [provider]  pantoprazole (PROTONIX) 40 MG tablet Take 1 tablet (40 mg total) by mouth daily for 15 days. 09/21/21 10/06/21  Arrien, Jimmy Picket, MD  polyethylene glycol (MIRALAX / GLYCOLAX) 17 g packet Take 17 g by mouth daily as needed. 09/20/21   Arrien, Jimmy Picket, MD  traMADol (ULTRAM) 50 MG tablet Take 1 tablet (50 mg total) by mouth 3 (three) times daily as needed for severe pain. 09/20/21   Arrien, Jimmy Picket, MD    Allergies    Yellow jacket venom [bee venom]  Review of Systems   Review of Systems  Gastrointestinal:  Positive for abdominal pain.  All other systems reviewed and  are negative.  Physical Exam Updated Vital Signs BP (!) 166/79   Pulse (!) 52   Temp 98.3 F (36.8 C)   Resp 18   Ht 6' (1.829 m)   Wt 95.3 kg   SpO2 100%   BMI 28.48 kg/m   Physical Exam Vitals and nursing note reviewed.  Constitutional:      General: He is not in acute distress.    Appearance: Normal appearance. He is well-developed.  HENT:     Head: Normocephalic and atraumatic.  Eyes:     Conjunctiva/sclera: Conjunctivae normal.     Pupils: Pupils are equal, round, and reactive to light.  Cardiovascular:     Rate and Rhythm: Normal rate and regular rhythm.     Heart sounds: Normal heart sounds.  Pulmonary:     Effort: Pulmonary effort is normal. No respiratory distress.      Breath sounds: Normal breath sounds.  Abdominal:     General: There is no distension.     Palpations: Abdomen is soft.     Tenderness: There is abdominal tenderness.     Comments: TTP in mid epigastric area  Musculoskeletal:        General: No deformity. Normal range of motion.     Cervical back: Normal range of motion and neck supple.  Skin:    General: Skin is warm and dry.  Neurological:     General: No focal deficit present.     Mental Status: He is alert and oriented to person, place, and time.    ED Results / Procedures / Treatments   Labs (all labs ordered are listed, but only abnormal results are displayed) Labs Reviewed  CBC WITH DIFFERENTIAL/PLATELET  COMPREHENSIVE METABOLIC PANEL  LIPASE, BLOOD  URINALYSIS, ROUTINE W REFLEX MICROSCOPIC  ETHANOL    EKG None  Radiology No results found.  Procedures Procedures   Medications Ordered in ED Medications  sodium chloride 0.9 % bolus 1,000 mL (has no administration in time range)  morphine 4 MG/ML injection 4 mg (has no administration in time range)  ondansetron (ZOFRAN) injection 4 mg (has no administration in time range)    ED Course  I have reviewed the triage vital signs and the nursing notes.  Pertinent labs & imaging results that were available during my care of the patient were reviewed by me and considered in my medical decision making (see chart for details).    MDM Rules/Calculators/A&P                           MDM  MSE complete  Max Ayers was evaluated in Emergency Department on 10/08/2021 for the symptoms described in the history of present illness. He was evaluated in the context of the global COVID-19 pandemic, which necessitated consideration that the patient might be at risk for infection with the SARS-CoV-2 virus that causes COVID-19. Institutional protocols and algorithms that pertain to the evaluation of patients at risk for COVID-19 are in a state of rapid change based on information  released by regulatory bodies including the CDC and federal and state organizations. These policies and algorithms were followed during the patient's care in the ED.  Patient is presenting with abdominal pain.  Patient is concerned that his symptoms today are recurrent pancreatitis.  Patient denies any recent alcohol use  Patient's work-up is suggestive of recurrent pancreatitis.  Lipase is elevated.  There is evidence of inflammation around pancreas on CT imaging.  Patient remains  uncomfortable after initial IV fluids, pain medication, and antiemetics.   Will plan for admission for further symptomatic management.  Hospitalist service is aware of case and will evaluate for admission.  Final Clinical Impression(s) / ED Diagnoses Final diagnoses:  Acute pancreatitis, unspecified complication status, unspecified pancreatitis type    Rx / DC Orders ED Discharge Orders     None        Valarie Merino, MD 10/08/21 1322

## 2021-10-09 DIAGNOSIS — Z8249 Family history of ischemic heart disease and other diseases of the circulatory system: Secondary | ICD-10-CM | POA: Diagnosis not present

## 2021-10-09 DIAGNOSIS — Z803 Family history of malignant neoplasm of breast: Secondary | ICD-10-CM | POA: Diagnosis not present

## 2021-10-09 DIAGNOSIS — Z833 Family history of diabetes mellitus: Secondary | ICD-10-CM | POA: Diagnosis not present

## 2021-10-09 DIAGNOSIS — Z87891 Personal history of nicotine dependence: Secondary | ICD-10-CM | POA: Diagnosis not present

## 2021-10-09 DIAGNOSIS — I10 Essential (primary) hypertension: Secondary | ICD-10-CM | POA: Diagnosis present

## 2021-10-09 DIAGNOSIS — Z79899 Other long term (current) drug therapy: Secondary | ICD-10-CM | POA: Diagnosis not present

## 2021-10-09 DIAGNOSIS — K859 Acute pancreatitis without necrosis or infection, unspecified: Secondary | ICD-10-CM | POA: Diagnosis present

## 2021-10-09 DIAGNOSIS — K861 Other chronic pancreatitis: Secondary | ICD-10-CM | POA: Diagnosis present

## 2021-10-09 DIAGNOSIS — Z20822 Contact with and (suspected) exposure to covid-19: Secondary | ICD-10-CM | POA: Diagnosis present

## 2021-10-09 DIAGNOSIS — F1091 Alcohol use, unspecified, in remission: Secondary | ICD-10-CM | POA: Diagnosis present

## 2021-10-09 LAB — URINALYSIS, ROUTINE W REFLEX MICROSCOPIC
Bilirubin Urine: NEGATIVE
Glucose, UA: NEGATIVE mg/dL
Hgb urine dipstick: NEGATIVE
Ketones, ur: NEGATIVE mg/dL
Leukocytes,Ua: NEGATIVE
Nitrite: NEGATIVE
Protein, ur: NEGATIVE mg/dL
Specific Gravity, Urine: 1.031 — ABNORMAL HIGH (ref 1.005–1.030)
pH: 6 (ref 5.0–8.0)

## 2021-10-09 LAB — COMPREHENSIVE METABOLIC PANEL
ALT: 15 U/L (ref 0–44)
AST: 16 U/L (ref 15–41)
Albumin: 3.4 g/dL — ABNORMAL LOW (ref 3.5–5.0)
Alkaline Phosphatase: 67 U/L (ref 38–126)
Anion gap: 6 (ref 5–15)
BUN: 12 mg/dL (ref 6–20)
CO2: 25 mmol/L (ref 22–32)
Calcium: 8.5 mg/dL — ABNORMAL LOW (ref 8.9–10.3)
Chloride: 105 mmol/L (ref 98–111)
Creatinine, Ser: 0.84 mg/dL (ref 0.61–1.24)
GFR, Estimated: >60 mL/min (ref >60)
Glucose, Bld: 87 mg/dL (ref 70–99)
Potassium: 3.8 mmol/L (ref 3.5–5.1)
Sodium: 136 mmol/L (ref 135–145)
Total Bilirubin: 0.8 mg/dL (ref 0.3–1.2)
Total Protein: 6.8 g/dL (ref 6.5–8.1)

## 2021-10-09 LAB — CBC
HCT: 39.1 % (ref 39.0–52.0)
Hemoglobin: 12.7 g/dL — ABNORMAL LOW (ref 13.0–17.0)
MCH: 29.4 pg (ref 26.0–34.0)
MCHC: 32.5 g/dL (ref 30.0–36.0)
MCV: 90.5 fL (ref 80.0–100.0)
Platelets: 193 10*3/uL (ref 150–400)
RBC: 4.32 MIL/uL (ref 4.22–5.81)
RDW: 13 % (ref 11.5–15.5)
WBC: 5.9 10*3/uL (ref 4.0–10.5)
nRBC: 0 % (ref 0.0–0.2)

## 2021-10-09 LAB — LIPASE, BLOOD: Lipase: 349 U/L — ABNORMAL HIGH (ref 11–51)

## 2021-10-09 MED ORDER — POLYETHYLENE GLYCOL 3350 17 G PO PACK
17.0000 g | PACK | Freq: Every day | ORAL | Status: DC
  Administered 2021-10-09 – 2021-10-10 (×2): 17 g via ORAL
  Filled 2021-10-09 (×3): qty 1

## 2021-10-09 NOTE — Progress Notes (Signed)
PROGRESS NOTE  Max Ayers YCX:[id number] DOB: June 10, 1971 DOA: 10/08/2021 PCP: No Pcp, Per Patient (Inactive)   LOS: 0 days   Brief Narrative / Interim history: 50 year old male with history of HTN, EtOH use, recurrent pancreatitis comes into the hospital with epigastric abdominal pain as well as nausea and vomiting.  It started 2 days prior to admission, initially was intermittent but now persistent.  He has a history of pancreatitis and was recently hospitalized couple weeks ago.  He tells me he has not been drinking alcohol but may not have watched his diet carefully.  On admission he was found to have acute pancreatitis  Subjective / 24h Interval events: Doing well this morning, less abdominal pain and less nausea.  Wants to try some liquids  Assessment & Plan: Principal Problem Acute recurrent pancreatitis -clinically seems to be improving but continue fluids, allow clear liquid today and closely monitor.  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 Yes; Fluid consistency: Thin  Diet effective now       Question Answer Comment  Room service appropriate? Yes   Fluid consistency: Thin      10/09/21 0722            DVT prophylaxis: enoxaparin (LOVENOX) injection 40 mg Start: 10/08/21 2200     Code Status: Full Code  Family Communication: no family at bedside   Status is:  Observation  The patient will require care spanning > 2 midnights and should be moved to inpatient because: IV pain medication needs, IV fluids   Level of care: Med-Surg  Consultants:  None   Procedures:  none  Microbiology  none  Antimicrobials: none    Objective: Vitals:   10/08/21 2020 10/09/21 0151 10/09/21 0649 10/09/21 1018  BP: (!) 151/95 (!) 149/93 124/77 126/86  Pulse: 76 86 78 70  Resp: 18 18 18 16   Temp: 98.4 F (36.9 C) 98.6 F (37 C) 98.7 F (37.1 C) 98 F (36.7 C)  TempSrc: Oral Oral Oral   SpO2: 97% 94% 97% 96%  Weight:      Height:        Intake/Output Summary (Last 24 hours) at 10/09/2021 1054 Last data filed at 10/09/2021 1000 Gross per 24 hour  Intake 3735.18 ml  Output --  Net 3735.18 ml   Filed Weights   10/08/21 0647  Weight: 95.3 kg    Examination:  Constitutional: NAD Eyes: no scleral icterus ENMT: Mucous membranes are moist.  Neck: normal, supple Respiratory: clear to auscultation bilaterally, no wheezing, no crackles. Normal respiratory effort. No accessory muscle use.  Cardiovascular: Regular rate and rhythm, no murmurs / rubs / gallops. No LE edema.  Abdomen: non distended, mild tenderness to palpation in the epigastric area, no guarding or rebound Musculoskeletal: no clubbing / cyanosis.  Skin: no rashes Neurologic: non focal    Data Reviewed: I have independently reviewed following labs and imaging studies   CBC: Recent  Labs  Lab 10/08/21 0702 10/09/21 0405  WBC 6.1 5.9  NEUTROABS 3.3  --   HGB 15.8 12.7*  HCT 47.5 39.1  MCV 90.3 90.5  PLT 241 326   Basic Metabolic Panel: Recent Labs  Lab 10/08/21 0702 10/09/21 0405  NA 138 136  K 4.2 3.8  CL 104 105  CO2 28 25  GLUCOSE 103* 87  BUN 20 12  CREATININE 1.13 0.84  CALCIUM 9.4 8.5*   Liver Function Tests: Recent Labs  Lab 10/08/21 0702 10/09/21 0405  AST 24 16  ALT 24 15  ALKPHOS 84 67  BILITOT 0.9 0.8  PROT 8.8* 6.8  ALBUMIN 4.8 3.4*    Coagulation Profile: No results for input(s): INR, PROTIME in the last 168 hours. HbA1C: No results for input(s): HGBA1C in the last 72 hours. CBG: No results for input(s): GLUCAP in the last 168 hours.  Recent Results (from the past 240 hour(s))  Resp Panel by RT-PCR (Flu A&B, Covid) Nasopharyngeal Swab     Status: None   Collection Time: 10/08/21  2:30 PM   Specimen: Nasopharyngeal Swab; Nasopharyngeal(NP) swabs in vial transport medium  Result Value Ref Range Status   SARS Coronavirus 2 by RT PCR NEGATIVE NEGATIVE Final    Comment: (NOTE) SARS-CoV-2 target nucleic acids are NOT DETECTED.  The SARS-CoV-2 RNA is generally detectable in upper respiratory specimens during the acute phase of infection. The lowest concentration of SARS-CoV-2 viral copies this assay can detect is 138 copies/mL. A negative result does not preclude SARS-Cov-2 infection and should not be used as the sole basis for treatment or other patient management decisions. A negative result may occur with  improper specimen collection/handling, submission of specimen other than nasopharyngeal swab, presence of viral mutation(s) within the areas targeted by this assay, and inadequate number of viral copies(<138 copies/mL). A negative result must be combined with clinical observations, patient history, and epidemiological information. The expected result is Negative.  Fact Sheet for Patients:  EntrepreneurPulse.com.au  Fact Sheet for Healthcare Providers:  IncredibleEmployment.be  This test is no t yet approved or cleared by the [place] FDA and  has been authorized for detection and/or diagnosis of SARS-CoV-2 by FDA under an Emergency Use Authorization (EUA). This EUA will remain  in effect (meaning this test can be used) for the duration of the COVID-19 declaration under Section 564(b)(1) of the Act, 21 U.S.C.section 360bbb-3(b)(1), unless the authorization is  terminated  or revoked sooner.       Influenza A by PCR NEGATIVE NEGATIVE Final   Influenza B by PCR NEGATIVE NEGATIVE Final    Comment: (NOTE) The Xpert Xpress SARS-CoV-2/FLU/RSV plus assay is intended as an aid in the diagnosis of influenza from Nasopharyngeal swab specimens and should not be used as a sole basis for treatment. Nasal washings and aspirates are unacceptable for Xpert Xpress SARS-CoV-2/FLU/RSV testing.  Fact Sheet for Patients: EntrepreneurPulse.com.au  Fact Sheet for Healthcare Providers: IncredibleEmployment.be  This test is not yet approved or cleared by the [place] FDA and has been authorized for detection and/or diagnosis of SARS-CoV-2 by FDA under an Emergency Use Authorization (EUA). This EUA will remain in effect (meaning this test can be used) for the duration of the COVID-19 declaration under Section 564(b)(1) of the Act, 21 U.S.C. section 360bbb-3(b)(1), unless the authorization is terminated or revoked.  Performed at [facility] [place] [address] [place], [place] [address]      Radiology Studies: CT ABDOMEN PELVIS W CONTRAST  Result  Date: 10/08/2021 CLINICAL DATA:  Abdominal pain EXAM: CT ABDOMEN AND PELVIS WITH CONTRAST TECHNIQUE: Multidetector CT imaging of the abdomen and pelvis was performed using the standard protocol following bolus administration of intravenous contrast. CONTRAST:  41mL OMNIPAQUE IOHEXOL 350 MG/ML SOLN COMPARISON:  CT abdomen and pelvis dated September 17, 2021 FINDINGS: Lower chest: No acute abnormality. Hepatobiliary: No focal liver abnormality is seen. No gallstones, gallbladder wall thickening, or biliary dilatation. Pancreas: Inflammatory change seen about the pancreas. No peripancreatic fluid collections. No parenchymal hypoenhancement. Cystic lesion of the pancreatic head is unchanged in size compared to prior exam, measures 1.7 x 1.1 cm on series 2, image 34,  likely a pseudocyst. Spleen: Normal in size without focal abnormality. Adrenals/Urinary Tract: Adrenal glands are unremarkable. Kidneys are normal, without renal calculi, focal lesion, or hydronephrosis. Bladder is unremarkable. Stomach/Bowel: Stomach is within normal limits. Appendix appears normal. No evidence of bowel wall thickening, distention, or inflammatory changes. Vascular/Lymphatic: Aortic atherosclerosis. No enlarged abdominal or pelvic lymph nodes. Reproductive: Prostate is unremarkable. Other: No abdominal wall hernia or abnormality. No abdominopelvic ascites. Musculoskeletal: No acute or significant osseous findings. IMPRESSION: 1. Peripancreatic fat stranding which appears slightly increased when compared with September 17, 2021 prior exam, likely due to recurrent acute uncomplicated pancreatitis. 2. Cystic lesion of the pancreatic head is unchanged in size compared to prior exam and likely a pseudocyst. 3. Aortic Atherosclerosis (ICD10-I70.0). Electronically Signed   By: Yetta Glassman M.D.   On: 10/08/2021 13:09     Marzetta Board, MD, PhD [facility]  Between 7 am - 7 pm I am available, please contact me via Amion (for emergencies) or Securechat (non urgent messages)  Between 7 pm - 7 am I am not available, please contact night coverage MD/APP via Amion

## 2021-10-10 DIAGNOSIS — K859 Acute pancreatitis without necrosis or infection, unspecified: Secondary | ICD-10-CM | POA: Diagnosis not present

## 2021-10-10 NOTE — Progress Notes (Signed)
PROGRESS NOTE  Max Ayers BLT:[id number] DOB: 04-17-1971 DOA: 10/08/2021 PCP: No Pcp, Per Patient (Inactive)   LOS: 1 day   Brief Narrative / Interim history: 50 year old male with history of HTN, EtOH use, recurrent pancreatitis comes into the hospital with epigastric abdominal pain as well as nausea and vomiting.  It started 2 days prior to admission, initially was intermittent but now persistent.  He has a history of pancreatitis and was recently hospitalized couple weeks ago.  He tells me he has not been drinking alcohol but may not have watched his diet carefully.  On admission he was found to have acute pancreatitis  Subjective / 24h Interval events: Still has some abdominal pain but better.  Tolerated clear liquid yesterday without issues  Assessment & Plan: Principal Problem Acute recurrent pancreatitis -clinically seems to be improving but continue fluids, advance diet today and closely monitor.  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 Yes; Fluid consistency: Thin  Diet effective now       Question Answer Comment  Room service appropriate? Yes   Fluid consistency: Thin      10/10/21 0707            DVT prophylaxis: enoxaparin (LOVENOX) injection 40 mg Start: 10/08/21 2200     Code Status: Full Code  Family Communication: no family at bedside   Status  is: Observation  The patient will require care spanning > 2 midnights and should be moved to inpatient because: IV pain medication needs, IV fluids   Level of care: Med-Surg  Consultants:  None   Procedures:  none  Microbiology  none  Antimicrobials: none    Objective: Vitals:   10/09/21 1018 10/09/21 1240 10/09/21 1924 10/10/21 0455  BP: 126/86 133/81 (!) 148/85 140/78  Pulse: 70 73 82 63  Resp: 16 18 18 18   Temp: 98 F (36.7 C) 98.7 F (37.1 C) 98.3 F (36.8 C) 98.4 F (36.9 C)  TempSrc:   Oral Oral  SpO2: 96% 97% 98% 100%  Weight:      Height:        Intake/Output Summary (Last 24 hours) at 10/10/2021 1145 Last data filed at 10/10/2021 0076 Gross per 24 hour  Intake 3083.05 ml  Output --  Net 3083.05 ml    Filed Weights   10/08/21 0647  Weight: 95.3 kg    Examination:  Constitutional: No distress Eyes: Anicteric ENMT: Moist mucous membranes Neck: normal, supple Respiratory: Clear bilaterally, no wheezing or crackles heard, normal respiratory effort Cardiovascular: Regular rate and rhythm, no murmurs, no peripheral edema Abdomen: Nondistended, mild tenderness palpation in the epigastric area without guarding or rebound Musculoskeletal: no clubbing / cyanosis.  Skin: No rashes seen Neurologic: No focal deficits   Data Reviewed: I have independently reviewed following labs and imaging studies   CBC: Recent Labs  Lab 10/08/21 0702 10/09/21 0405  WBC 6.1 5.9  NEUTROABS 3.3  --  HGB 15.8 12.7*  HCT 47.5 39.1  MCV 90.3 90.5  PLT 241 518    Basic Metabolic Panel: Recent Labs  Lab 10/08/21 0702 10/09/21 0405  NA 138 136  K 4.2 3.8  CL 104 105  CO2 28 25  GLUCOSE 103* 87  BUN 20 12  CREATININE 1.13 0.84  CALCIUM 9.4 8.5*    Liver Function Tests: Recent Labs  Lab 10/08/21 0702 10/09/21 0405  AST 24 16  ALT 24 15  ALKPHOS 84 67  BILITOT 0.9 0.8  PROT 8.8* 6.8  ALBUMIN 4.8 3.4*    Coagulation Profile: No results for  input(s): INR, PROTIME in the last 168 hours. HbA1C: No results for input(s): HGBA1C in the last 72 hours. CBG: No results for input(s): GLUCAP in the last 168 hours.  Recent Results (from the past 240 hour(s))  Resp Panel by RT-PCR (Flu A&B, Covid) Nasopharyngeal Swab     Status: None   Collection Time: 10/08/21  2:30 PM   Specimen: Nasopharyngeal Swab; Nasopharyngeal(NP) swabs in vial transport medium  Result Value Ref Range Status   SARS Coronavirus 2 by RT PCR NEGATIVE NEGATIVE Final    Comment: (NOTE) SARS-CoV-2 target nucleic acids are NOT DETECTED.  The SARS-CoV-2 RNA is generally detectable in upper respiratory specimens during the acute phase of infection. The lowest concentration of SARS-CoV-2 viral copies this assay can detect is 138 copies/mL. A negative result does not preclude SARS-Cov-2 infection and should not be used as the sole basis for treatment or other patient management decisions. A negative result may occur with  improper specimen collection/handling, submission of specimen other than nasopharyngeal swab, presence of viral mutation(s) within the areas targeted by this assay, and inadequate number of viral copies(<138 copies/mL). A negative result must be combined with clinical observations, patient history, and epidemiological information. The expected result is Negative.  Fact Sheet for Patients:  EntrepreneurPulse.com.au  Fact Sheet for Healthcare Providers:  IncredibleEmployment.be  This test is no t yet approved or cleared by the [place] FDA and  has been authorized for detection and/or diagnosis of SARS-CoV-2 by FDA under an Emergency Use Authorization (EUA). This EUA will remain  in effect (meaning this test can be used) for the duration of the COVID-19 declaration under Section 564(b)(1) of the Act, 21 U.S.C.section 360bbb-3(b)(1), unless the authorization is terminated  or revoked sooner.        Influenza A by PCR NEGATIVE NEGATIVE Final   Influenza B by PCR NEGATIVE NEGATIVE Final    Comment: (NOTE) The Xpert Xpress SARS-CoV-2/FLU/RSV plus assay is intended as an aid in the diagnosis of influenza from Nasopharyngeal swab specimens and should not be used as a sole basis for treatment. Nasal washings and aspirates are unacceptable for Xpert Xpress SARS-CoV-2/FLU/RSV testing.  Fact Sheet for Patients: EntrepreneurPulse.com.au  Fact Sheet for Healthcare Providers: IncredibleEmployment.be  This test is not yet approved or cleared by the [place] FDA and has been authorized for detection and/or diagnosis of SARS-CoV-2 by FDA under an Emergency Use Authorization (EUA). This EUA will remain in effect (meaning this test can be used) for the duration of the COVID-19 declaration under Section 564(b)(1) of the Act, 21 U.S.C. section 360bbb-3(b)(1), unless the authorization is terminated or revoked.  Performed at [facility], [place] [address], [place], [place] [address]       Radiology Studies: No results found.   Marzetta Board, MD, PhD [facility]  Between 7 am - 7 pm I am available,  please contact me via Amion (for emergencies) or Securechat (non urgent messages)  Between 7 pm - 7 am I am not available, please contact night coverage MD/APP via Amion

## 2021-10-11 DIAGNOSIS — K859 Acute pancreatitis without necrosis or infection, unspecified: Secondary | ICD-10-CM | POA: Diagnosis not present

## 2021-10-11 LAB — BASIC METABOLIC PANEL
Anion gap: 5 (ref 5–15)
BUN: 8 mg/dL (ref 6–20)
CO2: 25 mmol/L (ref 22–32)
Calcium: 8.4 mg/dL — ABNORMAL LOW (ref 8.9–10.3)
Chloride: 107 mmol/L (ref 98–111)
Creatinine, Ser: 0.78 mg/dL (ref 0.61–1.24)
GFR, Estimated: >60 mL/min (ref >60)
Glucose, Bld: 95 mg/dL (ref 70–99)
Potassium: 3.5 mmol/L (ref 3.5–5.1)
Sodium: 137 mmol/L (ref 135–145)

## 2021-10-11 LAB — CBC
HCT: 38.8 % — ABNORMAL LOW (ref 39.0–52.0)
Hemoglobin: 13.2 g/dL (ref 13.0–17.0)
MCH: 30 pg (ref 26.0–34.0)
MCHC: 34 g/dL (ref 30.0–36.0)
MCV: 88.2 fL (ref 80.0–100.0)
Platelets: 200 10*3/uL (ref 150–400)
RBC: 4.4 MIL/uL (ref 4.22–5.81)
RDW: 12.3 % (ref 11.5–15.5)
WBC: 4.6 10*3/uL (ref 4.0–10.5)
nRBC: 0 % (ref 0.0–0.2)

## 2021-10-11 MED ORDER — OXYCODONE-ACETAMINOPHEN 7.5-325 MG PO TABS: 1.0000 | ORAL_TABLET | Freq: Four times a day (QID) | ORAL | 0 refills | Status: DC | PRN

## 2021-10-11 MED ORDER — ONDANSETRON 4 MG PO TBDP: 4.0000 mg | ORAL_TABLET | Freq: Three times a day (TID) | ORAL | 0 refills | Status: DC | PRN

## 2021-10-11 NOTE — Plan of Care (Signed)
  Problem: Nutritional: Goal: Ability to achieve adequate nutritional intake will improve Outcome: Progressing   Problem: Health Behavior/Discharge Planning: Goal: Ability to formulate a plan to maintain an alcohol-free life will improve Outcome: Adequate for Discharge   Problem: Clinical Measurements: Goal: Complications related to the disease process, condition or treatment will be avoided or minimized Outcome: Adequate for Discharge   Problem: Education: Goal: Knowledge of Pancreatitis treatment and prevention will improve Outcome: Completed/Met

## 2021-10-11 NOTE — Discharge Summary (Signed)
Physician Discharge Summary  Max Ayers W[facility]:[id number] DOB: [birth year] DOA: 10/08/2021  PCP: No Pcp, Per Patient (Inactive)  Admit date: 10/08/2021 Discharge date: 10/11/2021  Admitted From: home Disposition:  home  Recommendations for Outpatient Follow-up:  Follow up with PCP in 1-2 weeks  Home Health: none Equipment/Devices: none  Discharge Condition: stable CODE STATUS: Full code Diet recommendation: low fat diet  HPI: Per admitting MD, Kamren Heintzelman is a 50 y.o. male with medical history significant of HTN, recurrent pancreatitis. Presenting with LUQ abdomen pain. Started 2 days ago. Initially crampy, but changed to a constant, sharp pain yesterday. He was nauseous, but did not vomit. Tried ibuprofen, APAP, tamadol, and meloxicam. Nothing helped. When his symptoms continued through this morning, he decided to come to the ED. He denies any other aggravating or alleviating factors.    Hospital Course / Discharge diagnoses: Principal Problem Acute recurrent pancreatitis -patient was admitted to the hospital with recurrent pancreatitis.  He has been abstinent from EtOH for the past several weeks.  He was treated conservatively with IV fluids, pain control, antiemetics with improvement in his condition.  His diet was slowly advanced, and he is now able to tolerate a regular diet without significant issues and will be discharged home in stable condition.  He was counseled for low-fat diet, EtOH avoidance  Active Problems Hypertension-blood pressure slightly elevated in the setting of pain, acute illness.  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   ondansetron 4 MG disintegrating tablet Commonly known as: Zofran ODT Take 1 tablet (4 mg total) by mouth every 8 (eight) hours as needed for nausea or vomiting.   oxyCODONE-acetaminophen 7.5-325 MG tablet Commonly known as: PERCOCET Take 1 tablet by mouth every 6 (six) hours as needed for severe pain.   pantoprazole 40 MG tablet Commonly known as: PROTONIX Take 1 tablet (40 mg total) by mouth daily for 15 days. What changed:  when to take this reasons to take this   polyethylene glycol 17 g packet Commonly known as: MIRALAX / GLYCOLAX Take 17 g by mouth daily as needed. What changed: reasons to take this         Consultations: None  Procedures/Studies:  CT ABDOMEN PELVIS W CONTRAST  Result Date: 10/08/2021 CLINICAL DATA:  Abdominal pain EXAM: CT ABDOMEN AND PELVIS WITH CONTRAST TECHNIQUE: Multidetector CT imaging of the abdomen and pelvis was performed using the standard protocol following bolus administration of intravenous contrast. CONTRAST:  77mL OMNIPAQUE IOHEXOL 350 MG/ML SOLN COMPARISON:  CT abdomen and pelvis dated September 17, 2021 FINDINGS: Lower chest: No acute abnormality. Hepatobiliary: No focal liver abnormality is seen. No gallstones, gallbladder wall thickening, or biliary dilatation. Pancreas: Inflammatory change seen about the pancreas. No peripancreatic fluid collections. No parenchymal hypoenhancement. Cystic lesion of the pancreatic head is unchanged in size compared to prior exam, measures 1.7 x 1.1 cm on series  2, image 34, likely a pseudocyst. Spleen: Normal in size without focal abnormality. Adrenals/Urinary Tract: Adrenal glands are unremarkable. Kidneys are normal, without renal calculi, focal lesion, or hydronephrosis. Bladder is unremarkable. Stomach/Bowel:  Stomach is within normal limits. Appendix appears normal. No evidence of bowel wall thickening, distention, or inflammatory changes. Vascular/Lymphatic: Aortic atherosclerosis. No enlarged abdominal or pelvic lymph nodes. Reproductive: Prostate is unremarkable. Other: No abdominal wall hernia or abnormality. No abdominopelvic ascites. Musculoskeletal: No acute or significant osseous findings. IMPRESSION: 1. Peripancreatic fat stranding which appears slightly increased when compared with September 17, 2021 prior exam, likely due to recurrent acute uncomplicated pancreatitis. 2. Cystic lesion of the pancreatic head is unchanged in size compared to prior exam and likely a pseudocyst. 3. Aortic Atherosclerosis (ICD10-I70.0). Electronically Signed   By: Yetta Glassman M.D.   On: 10/08/2021 13:09   CT ABDOMEN PELVIS W CONTRAST  Result Date: 09/17/2021 CLINICAL DATA:  50 year old male with history of abdominal pain. Suspected acute pancreatitis. EXAM: CT ABDOMEN AND PELVIS WITH CONTRAST TECHNIQUE: Multidetector CT imaging of the abdomen and pelvis was performed using the standard protocol following bolus administration of intravenous contrast. CONTRAST:  187mL OMNIPAQUE IOHEXOL 300 MG/ML  SOLN COMPARISON:  CT the abdomen and pelvis 12/12/2020. Abdominal MRI 04/09/2021. FINDINGS: Lower chest: Unremarkable. Hepatobiliary: No suspicious cystic or solid hepatic lesions. No intra or extrahepatic biliary ductal dilatation. Gallbladder is normal in appearance. 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 Today's study demonstrates new inflammatory changes around the entire pancreas, compatible with suspected pancreatitis. No well-defined pancreatic or peripancreatic fluid collections are otherwise  noted to suggest new pancreatic pseudocyst. Pancreatic parenchyma appears to enhance normally. No pancreatic ductal dilatation. Spleen: Unremarkable. Adrenals/Urinary Tract: Bilateral kidneys and bilateral adrenal glands are normal in appearance. No hydroureteronephrosis. Urinary bladder is normal in appearance. Stomach/Bowel: The appearance of the stomach is normal. No pathologic dilatation of small bowel or colon. Inflammatory changes are noted surrounding the duodenum, presumably reactive from the adjacent pancreatitis. Numerous colonic diverticulae are noted, without surrounding inflammatory changes to suggest an acute diverticulitis at this time. Normal appendix. Vascular/Lymphatic: Aortic atherosclerosis. Several prominent borderline enlarged retroperitoneal and upper abdominal lymph nodes are noted, most evident in the peripancreatic region, likely reactive. No other pathologically enlarged lymph nodes are noted elsewhere in the abdomen or pelvis. Reproductive: Prostate gland and seminal vesicles are unremarkable in appearance. Other: No significant volume of ascites.  No pneumoperitoneum. Musculoskeletal: There are no aggressive appearing lytic or blastic lesions noted in the visualized portions of the skeleton. IMPRESSION: 1. Findings are compatible with acute pancreatitis, as above. No evidence of pancreatic necrosis. 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 No new pancreatic or peripancreatic pseudocyst identified on today's examination. 2. Colonic diverticulosis without evidence of acute diverticulitis at this time. 3. Aortic atherosclerosis. 4. Additional incidental findings, as above. Electronically Signed   By: Vinnie Langton M.D.   On: 09/17/2021 07:57     Subjective: - no chest pain, shortness of breath, no abdominal pain, nausea or vomiting.   Discharge Exam: BP (!) 152/94 (BP Location: Right Arm)   Pulse 72   Temp  98 F (36.7 C) (Oral)   Resp 18   Ht 6' (1.829 m)  Wt 95.3 kg   SpO2 99%   BMI 28.48 kg/m   General: Pt is alert, awake, not in acute distress Cardiovascular: RRR, S1/S2 +, no rubs, no gallops Respiratory: CTA bilaterally, no wheezing, no rhonchi Abdominal: Soft, NT, ND, bowel sounds + Extremities: no edema, no cyanosis    The results of significant diagnostics from this hospitalization (including imaging, microbiology, ancillary and laboratory) are listed below for reference.     Microbiology: Recent Results (from the past 240 hour(s))  Resp Panel by RT-PCR (Flu A&B, Covid) Nasopharyngeal Swab     Status: None   Collection Time: 10/08/21  2:30 PM   Specimen: Nasopharyngeal Swab; Nasopharyngeal(NP) swabs in vial transport medium  Result Value Ref Range Status   SARS Coronavirus 2 by RT PCR NEGATIVE NEGATIVE Final    Comment: (NOTE) SARS-CoV-2 target nucleic acids are NOT DETECTED.  The SARS-CoV-2 RNA is generally detectable in upper respiratory specimens during the acute phase of infection. The lowest concentration of SARS-CoV-2 viral copies this assay can detect is 138 copies/mL. A negative result does not preclude SARS-Cov-2 infection and should not be used as the sole basis for treatment or other patient management decisions. A negative result may occur with  improper specimen collection/handling, submission of specimen other than nasopharyngeal swab, presence of viral mutation(s) within the areas targeted by this assay, and inadequate number of viral copies(<138 copies/mL). A negative result must be combined with clinical observations, patient history, and epidemiological information. The expected result is Negative.  Fact Sheet for Patients:  EntrepreneurPulse.com.au  Fact Sheet for Healthcare Providers:  IncredibleEmployment.be  This test is no t yet approved or cleared by the [place] FDA and  has been authorized for  detection and/or diagnosis of SARS-CoV-2 by FDA under an Emergency Use Authorization (EUA). This EUA will remain  in effect (meaning this test can be used) for the duration of the COVID-19 declaration under Section 564(b)(1) of the Act, 21 U.S.C.section 360bbb-3(b)(1), unless the authorization is terminated  or revoked sooner.       Influenza A by PCR NEGATIVE NEGATIVE Final   Influenza B by PCR NEGATIVE NEGATIVE Final    Comment: (NOTE) The Xpert Xpress SARS-CoV-2/FLU/RSV plus assay is intended as an aid in the diagnosis of influenza from Nasopharyngeal swab specimens and should not be used as a sole basis for treatment. Nasal washings and aspirates are unacceptable for Xpert Xpress SARS-CoV-2/FLU/RSV testing.  Fact Sheet for Patients: EntrepreneurPulse.com.au  Fact Sheet for Healthcare Providers: IncredibleEmployment.be  This test is not yet approved or cleared by the [place] FDA and has been authorized for detection and/or diagnosis of SARS-CoV-2 by FDA under an Emergency Use Authorization (EUA). This EUA will remain in effect (meaning this test can be used) for the duration of the COVID-19 declaration under Section 564(b)(1) of the Act, 21 U.S.C. section 360bbb-3(b)(1), unless the authorization is terminated or revoked.  Performed at [facility], [place] [address], [place], [place] [address]      Labs: Basic Metabolic Panel: Recent Labs  Lab 10/08/21 0702 10/09/21 0405 10/11/21 0429  NA 138 136 137  K 4.2 3.8 3.5  CL 104 105 107  CO2 28 25 25   GLUCOSE 103* 87 95  BUN 20 12 8   CREATININE 1.13 0.84 0.78  CALCIUM 9.4 8.5* 8.4*   Liver Function Tests: Recent Labs  Lab 10/08/21 0702 10/09/21 0405  AST 24 16  ALT 24 15  ALKPHOS 84 67  BILITOT 0.9 0.8  PROT 8.8*  6.8  ALBUMIN 4.8 3.4*   CBC: Recent Labs  Lab 10/08/21 0702 10/09/21 0405 10/11/21 0429  WBC 6.1 5.9 4.6  NEUTROABS 3.3  --   --    HGB 15.8 12.7* 13.2  HCT 47.5 39.1 38.8*  MCV 90.3 90.5 88.2  PLT 241 193 200   CBG: No results for input(s): GLUCAP in the last 168 hours. Hgb A1c No results for input(s): HGBA1C in the last 72 hours. Lipid Profile Recent Labs    10/08/21 1703  CHOL 228*  HDL 38*  LDLCALC 166*  TRIG 118  CHOLHDL 6.0   Thyroid function studies No results for input(s): TSH, T4TOTAL, T3FREE, THYROIDAB in the last 72 hours.  Invalid input(s): FREET3 Urinalysis    Component Value Date/Time   COLORURINE YELLOW 10/08/2021 2331   APPEARANCEUR CLEAR 10/08/2021 2331   LABSPEC 1.031 (H) 10/08/2021 2331   PHURINE 6.0 10/08/2021 2331   GLUCOSEU NEGATIVE 10/08/2021 2331   HGBUR NEGATIVE 10/08/2021 2331   BILIRUBINUR NEGATIVE 10/08/2021 2331   KETONESUR NEGATIVE 10/08/2021 2331   PROTEINUR NEGATIVE 10/08/2021 2331   UROBILINOGEN 0.2 10/27/2011 0957   NITRITE NEGATIVE 10/08/2021 2331   LEUKOCYTESUR NEGATIVE 10/08/2021 2331    FURTHER DISCHARGE INSTRUCTIONS:   Get Medicines reviewed and adjusted: Please take all your medications with you for your next visit with your Primary MD   Laboratory/radiological data: Please request your Primary MD to go over all hospital tests and procedure/radiological results at the follow up, please ask your Primary MD to get all Hospital records sent to his/her office.   In some cases, they will be blood work, cultures and biopsy results pending at the time of your discharge. Please request that your primary care M.D. goes through all the records of your hospital data and follows up on these results.   Also Note the following: If you experience worsening of your admission symptoms, develop shortness of breath, life threatening emergency, suicidal or homicidal thoughts you must seek medical attention immediately by calling 911 or calling your MD immediately  if symptoms less severe.   You must read complete instructions/literature along with all the possible adverse  reactions/side effects for all the Medicines you take and that have been prescribed to you. Take any new Medicines after you have completely understood and accpet all the possible adverse reactions/side effects.    Do not drive when taking Pain medications or sleeping medications (Benzodaizepines)   Do not take more than prescribed Pain, Sleep and Anxiety Medications. It is not advisable to combine anxiety,sleep and pain medications without talking with your primary care practitioner   Special Instructions: If you have smoked or chewed Tobacco  in the last 2 yrs please stop smoking, stop any regular Alcohol  and or any Recreational drug use.   Wear Seat belts while driving.   Please note: You were cared for by a hospitalist during your hospital stay. Once you are discharged, your primary care physician will handle any further medical issues. Please note that NO REFILLS for any discharge medications will be authorized once you are discharged, as it is imperative that you return to your primary care physician (or establish a relationship with a primary care physician if you do not have one) for your post hospital discharge needs so that they can reassess your need for medications and monitor your lab values.  Time coordinating discharge: 40 minutes  SIGNED:  Marzetta Board, MD, PhD 10/11/2021, 8:31 AM

## 2021-11-22 DIAGNOSIS — H6592 Unspecified nonsuppurative otitis media, left ear: Secondary | ICD-10-CM | POA: Insufficient documentation

## 2021-11-22 DIAGNOSIS — H6121 Impacted cerumen, right ear: Secondary | ICD-10-CM | POA: Insufficient documentation

## 2021-12-08 ENCOUNTER — Telehealth: Payer: Self-pay | Admitting: Gastroenterology

## 2021-12-08 ENCOUNTER — Other Ambulatory Visit: Payer: Self-pay

## 2021-12-08 DIAGNOSIS — K869 Disease of pancreas, unspecified: Secondary | ICD-10-CM

## 2021-12-08 NOTE — Telephone Encounter (Signed)
With the patient's clinical history and prior recurrent alcohol use, this is most consistent with a likely pseudocyst rather than anything else.  He has had multiple imaging studies most recently. It is reasonable to go ahead and get the patient an MRI/MRCP with contrast however since things have changed.  He can follow-up with PA [place] or myself after the MRI has been completed.  Go ahead and get that scheduled.  Thanks. GM

## 2021-12-08 NOTE — Telephone Encounter (Signed)
Desiree, PA with [facility] [place], called about this patient.  She is concerned that his pancreas lesion seems to be increasing.  On 10/28, his CT scan showed it to be 1.7 x 1.1.  She did an ultrasound on him yesterday and showed it as being 3.9 x 2.5 x 3.2.  She wanted to defer to you as to whether or not you wanted to do the MRI/MRCP earlier than planned.  Please reach out to patient and let him know the course of action you'd like him to take.  Thank you.

## 2021-12-08 NOTE — Telephone Encounter (Signed)
Spoke with pt regarding MRI. Orders have been placed, and schedulers have been notified. Follow-up has been made for 01/14/22 at 9:50 am with GM.

## 2021-12-13 ENCOUNTER — Inpatient Hospital Stay (HOSPITAL_BASED_OUTPATIENT_CLINIC_OR_DEPARTMENT_OTHER)
Admission: EM | Admit: 2021-12-13 | Discharge: 2021-12-17 | DRG: 439 | Disposition: A | Discharge status: Home / Self Care | Payer: Medicaid Other | Attending: Internal Medicine | Admitting: Internal Medicine

## 2021-12-13 ENCOUNTER — Emergency Department (HOSPITAL_BASED_OUTPATIENT_CLINIC_OR_DEPARTMENT_OTHER): Payer: Medicaid Other

## 2021-12-13 ENCOUNTER — Encounter (HOSPITAL_BASED_OUTPATIENT_CLINIC_OR_DEPARTMENT_OTHER): Payer: Self-pay | Admitting: *Deleted

## 2021-12-13 ENCOUNTER — Other Ambulatory Visit: Payer: Self-pay

## 2021-12-13 DIAGNOSIS — Z8249 Family history of ischemic heart disease and other diseases of the circulatory system: Secondary | ICD-10-CM

## 2021-12-13 DIAGNOSIS — Z833 Family history of diabetes mellitus: Secondary | ICD-10-CM

## 2021-12-13 DIAGNOSIS — T466X5A Adverse effect of antihyperlipidemic and antiarteriosclerotic drugs, initial encounter: Secondary | ICD-10-CM | POA: Diagnosis present

## 2021-12-13 DIAGNOSIS — K862 Cyst of pancreas: Secondary | ICD-10-CM | POA: Diagnosis present

## 2021-12-13 DIAGNOSIS — Z20822 Contact with and (suspected) exposure to covid-19: Secondary | ICD-10-CM | POA: Diagnosis present

## 2021-12-13 DIAGNOSIS — K859 Acute pancreatitis without necrosis or infection, unspecified: Secondary | ICD-10-CM | POA: Diagnosis present

## 2021-12-13 DIAGNOSIS — R0902 Hypoxemia: Secondary | ICD-10-CM

## 2021-12-13 DIAGNOSIS — F1010 Alcohol abuse, uncomplicated: Secondary | ICD-10-CM | POA: Diagnosis present

## 2021-12-13 DIAGNOSIS — K852 Alcohol induced acute pancreatitis without necrosis or infection: Principal | ICD-10-CM | POA: Diagnosis present

## 2021-12-13 DIAGNOSIS — K5900 Constipation, unspecified: Secondary | ICD-10-CM | POA: Diagnosis present

## 2021-12-13 DIAGNOSIS — I10 Essential (primary) hypertension: Secondary | ICD-10-CM | POA: Diagnosis present

## 2021-12-13 DIAGNOSIS — E785 Hyperlipidemia, unspecified: Secondary | ICD-10-CM | POA: Diagnosis present

## 2021-12-13 DIAGNOSIS — Z87891 Personal history of nicotine dependence: Secondary | ICD-10-CM

## 2021-12-13 LAB — COMPREHENSIVE METABOLIC PANEL
ALT: 27 U/L (ref 0–44)
AST: 27 U/L (ref 15–41)
Albumin: 4.1 g/dL (ref 3.5–5.0)
Alkaline Phosphatase: 67 U/L (ref 38–126)
Anion gap: 10 (ref 5–15)
BUN: 14 mg/dL (ref 6–20)
CO2: 23 mmol/L (ref 22–32)
Calcium: 8.9 mg/dL (ref 8.9–10.3)
Chloride: 104 mmol/L (ref 98–111)
Creatinine, Ser: 0.96 mg/dL (ref 0.61–1.24)
GFR, Estimated: >60 mL/min (ref >60)
Glucose, Bld: 103 mg/dL — ABNORMAL HIGH (ref 70–99)
Potassium: 3.8 mmol/L (ref 3.5–5.1)
Sodium: 137 mmol/L (ref 135–145)
Total Bilirubin: 0.6 mg/dL (ref 0.3–1.2)
Total Protein: 7.6 g/dL (ref 6.5–8.1)

## 2021-12-13 LAB — RESP PANEL BY RT-PCR (FLU A&B, COVID) ARPGX2
Influenza A by PCR: NEGATIVE
Influenza B by PCR: NEGATIVE
SARS Coronavirus 2 by RT PCR: NEGATIVE

## 2021-12-13 LAB — CBC
HCT: 42.8 % (ref 39.0–52.0)
Hemoglobin: 14.8 g/dL (ref 13.0–17.0)
MCH: 29.7 pg (ref 26.0–34.0)
MCHC: 34.6 g/dL (ref 30.0–36.0)
MCV: 85.8 fL (ref 80.0–100.0)
Platelets: 218 10*3/uL (ref 150–400)
RBC: 4.99 MIL/uL (ref 4.22–5.81)
RDW: 13.1 % (ref 11.5–15.5)
WBC: 7.1 10*3/uL (ref 4.0–10.5)
nRBC: 0 % (ref 0.0–0.2)

## 2021-12-13 LAB — LIPASE, BLOOD: Lipase: 1585 U/L — ABNORMAL HIGH (ref 11–51)

## 2021-12-13 MED ORDER — SODIUM CHLORIDE 0.9 % IV BOLUS
1000.0000 mL | Freq: Once | INTRAVENOUS | Status: AC
  Administered 2021-12-13: 1000 mL via INTRAVENOUS

## 2021-12-13 MED ORDER — SODIUM CHLORIDE 0.9 % IV SOLN
1000.0000 mL | INTRAVENOUS | Status: DC
  Administered 2021-12-14 – 2021-12-15 (×3): 1000 mL via INTRAVENOUS

## 2021-12-13 MED ORDER — IOHEXOL 300 MG/ML  SOLN
100.0000 mL | Freq: Once | INTRAMUSCULAR | Status: AC | PRN
  Administered 2021-12-13: 100 mL via INTRAVENOUS

## 2021-12-13 MED ORDER — MORPHINE SULFATE (PF) 4 MG/ML IV SOLN
4.0000 mg | Freq: Once | INTRAVENOUS | Status: AC
  Administered 2021-12-13: 4 mg via INTRAVENOUS
  Filled 2021-12-13: qty 1

## 2021-12-13 MED ORDER — SODIUM CHLORIDE 0.9 % IV BOLUS (SEPSIS)
1000.0000 mL | Freq: Once | INTRAVENOUS | Status: AC
  Administered 2021-12-14: 1000 mL via INTRAVENOUS

## 2021-12-13 MED ORDER — ONDANSETRON HCL 4 MG/2ML IJ SOLN
4.0000 mg | Freq: Once | INTRAMUSCULAR | Status: AC
  Administered 2021-12-13: 4 mg via INTRAVENOUS
  Filled 2021-12-13: qty 2

## 2021-12-13 NOTE — ED Triage Notes (Signed)
C/o abd pain nausea only hx pancreatitis

## 2021-12-13 NOTE — ED Provider Notes (Signed)
Emergency Department Provider Note   I have reviewed the triage vital signs and the nursing notes.   HISTORY  Chief Complaint Abdominal Pain   HPI Max Ayers is a 51 y.o. male past medical history of alcohol pancreatitis presents to the emergency department with epigastric and left upper quadrant abdominal pain starting today.  He denies alcohol use.  He states he drinks 1 glass of wine on Monday but nothing recent. Denies illicit drug use. He took motrin today and shortly after felt worsening pain. He has some associated nausea and vomiting, w/o blood or black. No diarrhea. No fever or confusion. Pain feels similar to prior pancreatitis.    Review of Systems  Constitutional: No fever/chills Eyes: No visual changes. ENT: No sore throat. Cardiovascular: Denies chest pain. Respiratory: Denies shortness of breath. Gastrointestinal: Positive epigastric abdominal pain. Positive nausea and vomiting.  No diarrhea.  No constipation. Genitourinary: Negative for dysuria. Musculoskeletal: Negative for back pain. Skin: Negative for rash. Neurological: Negative for headaches, focal weakness or numbness.  10-point ROS otherwise negative.  ____________________________________________   PHYSICAL EXAM:  VITAL SIGNS: ED Triage Vitals  Enc Vitals Group     BP 12/13/21 2056 (!) 184/92     Pulse Rate 12/13/21 2056 67     Resp 12/13/21 2056 16     Temp 12/13/21 2056 97.8 F (36.6 C)     Temp Source 12/13/21 2056 Oral     SpO2 12/13/21 2056 98 %     Weight 12/13/21 2054 200 lb (90.7 kg)     Height 12/13/21 2054 5\' 11"  (1.803 m)    Constitutional: Alert and oriented. Patient laying on side in a position of comfort.  Eyes: Conjunctivae are normal.  Head: Atraumatic. Nose: No congestion/rhinnorhea. Mouth/Throat: Mucous membranes are moist.   Neck: No stridor.   Cardiovascular: Normal rate, regular rhythm. Good peripheral circulation. Grossly normal heart sounds.   Respiratory:  Normal respiratory effort.  No retractions. Lungs CTAB. Gastrointestinal: Soft with diffuse tenderness worse in the LUQ with some voluntary guarding. No distention.  Musculoskeletal: No lower extremity tenderness nor edema. No gross deformities of extremities. Neurologic:  Normal speech and language. No gross focal neurologic deficits are appreciated.  Skin:  Skin is warm, dry and intact. No rash noted.   ____________________________________________   LABS (all labs ordered are listed, but only abnormal results are displayed)  Labs Reviewed  LIPASE, BLOOD - Abnormal; Notable for the following components:      Result Value   Lipase 1,585 (*)    All other components within normal limits  COMPREHENSIVE METABOLIC PANEL - Abnormal; Notable for the following components:   Glucose, Bld 103 (*)    All other components within normal limits  RESP PANEL BY RT-PCR (FLU A&B, COVID) ARPGX2  CBC  URINALYSIS, ROUTINE W REFLEX MICROSCOPIC   ____________________________________________  RADIOLOGY  CT ABDOMEN PELVIS W CONTRAST  Result Date: 12/13/2021 CLINICAL DATA:  Abdominal pain, acute, nonlocalized pancreatitis EXAM: CT ABDOMEN AND PELVIS WITH CONTRAST TECHNIQUE: Multidetector CT imaging of the abdomen and pelvis was performed using the standard protocol following bolus administration of intravenous contrast. CONTRAST:  182mL OMNIPAQUE IOHEXOL 300 MG/ML  SOLN COMPARISON:  CT abdomen pelvis 10/08/2021 FINDINGS: Lower chest: No acute abnormality. Hepatobiliary: No focal liver abnormality. No gallstones, gallbladder wall thickening, or pericholecystic fluid. Borderline enlarged common bile duct measuring up to 9 mm. Pancreas: Hazy pancreatic contour with associated peripancreatic fat stranding diffusely. Trace free fluid. There is a 1.5 cm hypodense lesion within  the proximal pancreas. Spleen: Normal in size without focal abnormality. Adrenals/Urinary Tract: No adrenal nodule bilaterally. Bilateral  kidneys enhance symmetrically. No hydronephrosis. No hydroureter. The urinary bladder is unremarkable. On delayed imaging, there is no urothelial wall thickening and there are no filling defects in the opacified portions of the bilateral collecting systems or ureters. Stomach/Bowel: Stomach is within normal limits. No evidence of bowel wall thickening or dilatation. Appendix appears normal. Vascular/Lymphatic: No abdominal aorta or iliac aneurysm. Mild atherosclerotic plaque of the aorta and its branches. No abdominal, pelvic, or inguinal lymphadenopathy. Reproductive: Prostate is unremarkable. Other: No intraperitoneal free fluid. No intraperitoneal free gas. No organized fluid collection. Musculoskeletal: No abdominal wall hernia or abnormality. No suspicious lytic or blastic osseous lesions. No acute displaced fracture. Multilevel degenerative changes of the spine. IMPRESSION: 1. Acute pancreatitis. 2. A 1.8 cm fluid density within the proximal pancreas. Finding may be related to pancreatic duct dilatation versus pseudocyst versus underlying pancreatic lesion. Consider MRI pancreatic protocol status post complete resolution of inflammatory changes/pancreatitis. 3. Borderline enlarged common bile duct measuring up to 9 mm. 4.  Aortic Atherosclerosis (ICD10-I70.0). Electronically Signed   By: Iven Finn M.D.   On: 12/13/2021 23:43   DG Chest Port 1 View  Result Date: 12/14/2021 CLINICAL DATA:  Hypoxia. EXAM: PORTABLE CHEST 1 VIEW COMPARISON:  August 20, 2006 FINDINGS: Multiple overlying radiopaque cardiac lead wires are seen. The cardiac silhouette is mildly enlarged. Both lungs are clear. The visualized skeletal structures are unremarkable. IMPRESSION: No acute cardiopulmonary disease. Electronically Signed   By: Virgina Norfolk M.D.   On: 12/14/2021 03:43    ____________________________________________   PROCEDURES  Procedure(s) performed:    Procedures  None ____________________________________________   INITIAL IMPRESSION / ASSESSMENT AND PLAN / ED COURSE  Pertinent labs & imaging results that were available during my care of the patient were reviewed by me and considered in my medical decision making (see chart for details).    Patient Vitals for the past 24 hrs:  BP Temp Temp src Pulse Resp SpO2 Height Weight  12/14/21 1100 134/78 -- -- (!) 59 16 100 % -- --  12/14/21 1030 (!) 160/81 -- -- 68 14 99 % -- --  12/14/21 1000 (!) 145/80 -- -- 69 14 98 % -- --  12/14/21 0800 139/72 -- -- (!) 45 (!) 9 96 % -- --  12/14/21 0730 133/72 -- -- (!) 46 (!) 9 97 % -- --  12/14/21 0700 (!) 139/96 -- -- (!) 56 15 96 % -- --  12/14/21 0645 132/74 -- -- 69 14 95 % -- --  12/14/21 0630 129/75 -- -- (!) 48 11 96 % -- --  12/14/21 0550 (!) 155/84 -- -- (!) 53 17 96 % -- --  12/14/21 0330 136/64 -- -- 69 17 100 % -- --  12/14/21 0315 (!) 147/74 -- -- (!) 45 13 99 % -- --  12/14/21 0245 134/84 -- -- 65 15 (!) 89 % -- --  12/14/21 0230 (!) 143/78 -- -- (!) 51 11 91 % -- --  12/14/21 0215 140/73 -- -- (!) 48 12 90 % -- --  12/14/21 0200 (!) 149/80 -- -- (!) 55 (!) 9 90 % -- --  12/14/21 0145 (!) 156/102 -- -- (!) 58 (!) 9 93 % -- --  12/14/21 0045 (!) 176/89 -- -- 63 19 95 % -- --  12/14/21 0015 (!) 177/101 -- -- 65 -- 97 % -- --  12/14/21 0005 (!) 175/96 -- --  61 17 99 % -- --  12/13/21 2056 (!) 184/92 97.8 F (36.6 C) Oral 67 16 98 % -- --  12/13/21 2054 -- -- -- -- -- -- 5\' 11"  (1.803 m) 90.7 kg     Medical Decision Making: Summary: Patient arrives with epigastric abdominal pain along with nausea and vomiting.  Lab work from triage shows lipase of 1500.  Patient is tender on exam diffusely but worse in the left upper quadrant.  No leukocytosis, LFT abnormality, bilirubin abnormality.  Will need CT imaging given the elevated lipase to rule out pancreatic pseudocyst/abscess although low suspicion overall. Plan for IVF along with  pain and nausea mgmt.   Critical Interventions- Pain medications, IVF, nausea medication, and CT abdomen/pelvis w/ contrast; to evaluate  Chief Complaint  Patient presents with   Abdominal Pain    and assess for illness characterized as Acute, Previously Undiagnosed, Uncertain Prognosis, Complicated, Systemic Symptoms, and Threat to Life/Bodily Function   After These Interventions, the Patient was reevaluated and was found to have improved pain and improved vital signs.   This patient is Presenting for Evaluation of acute pancreatitis, which does require a range of treatment options, and is a complaint that involves a high risk of morbidity and mortality.  The Differential Diagnoses includes but is not exclusive to acute cholecystitis, intrathoracic causes for epigastric abdominal pain, gastritis, duodenitis, pancreatitis, small bowel or large bowel obstruction, abdominal aortic aneurysm, hernia, gastritis, etc.   I decided to review pertinent External Data, and in summary patient with several admissions for acute alcohol pancreatitis. Most recent admit in 09/2021.   Clinical Laboratory Tests Ordered, included CBC and Metabolic panel. Review indicates severely elevated lipase. No anemia, AKI, electrolyte disturbance.  Emergent testing abnormality management required for stabilization- acute pancreatitis evaluate for complication.    Radiologic Tests Ordered, included CT abdomen/pelvis.  I independently Visualized: CT imaged Images, which show inflammation surrounding the pancreas with question of pseudocyst.    Pharmaceutical Risk Management morphine and zofran given IV  Social Determinants of Health Risk with history of EtOH use although denies recent use.   Treatment Complication Risk Hospitalization  Care transferred to Dr. Leonette Monarch for CT follow up after radiology interpretation and discussion with medicine team for admit.    _________________________________________  FINAL  CLINICAL IMPRESSION(S) / ED DIAGNOSES  Final diagnoses:  Alcohol-induced acute pancreatitis without infection or necrosis  Hypoxia     MEDICATIONS GIVEN DURING THIS VISIT:  Medications  sodium chloride 0.9 % bolus 1,000 mL (0 mLs Intravenous Stopped 12/14/21 0407)    Followed by  sodium chloride 0.9 % bolus 1,000 mL (0 mLs Intravenous Stopped 12/14/21 0407)    Followed by  0.9 %  sodium chloride infusion ( Intravenous Stopped 12/14/21 0856)  HYDROmorphone (DILAUDID) injection 1 mg (1 mg Intravenous Given 12/14/21 0619)  sodium chloride 0.9 % bolus 1,000 mL (0 mLs Intravenous Stopped 12/14/21 0051)  morphine 4 MG/ML injection 4 mg (4 mg Intravenous Given 12/13/21 2258)  ondansetron (ZOFRAN) injection 4 mg (4 mg Intravenous Given 12/13/21 2258)  iohexol (OMNIPAQUE) 300 MG/ML solution 100 mL (100 mLs Intravenous Contrast Given 12/13/21 2323)  HYDROmorphone (DILAUDID) injection 1 mg (1 mg Intravenous Given 12/14/21 0053)  morphine 4 MG/ML injection 4 mg (4 mg Intravenous Given 12/14/21 1012)     Note:  This document was prepared using Dragon voice recognition software and may include unintentional dictation errors.  Nanda Quinton, MD, [facility]    Fransisca Shawn, Wonda Olds, MD 12/14/21 1228

## 2021-12-14 ENCOUNTER — Emergency Department (HOSPITAL_BASED_OUTPATIENT_CLINIC_OR_DEPARTMENT_OTHER): Payer: Medicaid Other

## 2021-12-14 DIAGNOSIS — T466X5A Adverse effect of antihyperlipidemic and antiarteriosclerotic drugs, initial encounter: Secondary | ICD-10-CM | POA: Diagnosis present

## 2021-12-14 DIAGNOSIS — K5900 Constipation, unspecified: Secondary | ICD-10-CM | POA: Diagnosis present

## 2021-12-14 DIAGNOSIS — Z8249 Family history of ischemic heart disease and other diseases of the circulatory system: Secondary | ICD-10-CM | POA: Diagnosis not present

## 2021-12-14 DIAGNOSIS — K852 Alcohol induced acute pancreatitis without necrosis or infection: Secondary | ICD-10-CM

## 2021-12-14 DIAGNOSIS — Z20822 Contact with and (suspected) exposure to covid-19: Secondary | ICD-10-CM | POA: Diagnosis present

## 2021-12-14 DIAGNOSIS — Z87891 Personal history of nicotine dependence: Secondary | ICD-10-CM | POA: Diagnosis not present

## 2021-12-14 DIAGNOSIS — E782 Mixed hyperlipidemia: Secondary | ICD-10-CM

## 2021-12-14 DIAGNOSIS — K853 Drug induced acute pancreatitis without necrosis or infection: Secondary | ICD-10-CM

## 2021-12-14 DIAGNOSIS — Z833 Family history of diabetes mellitus: Secondary | ICD-10-CM | POA: Diagnosis not present

## 2021-12-14 DIAGNOSIS — F1010 Alcohol abuse, uncomplicated: Secondary | ICD-10-CM | POA: Diagnosis present

## 2021-12-14 DIAGNOSIS — I10 Essential (primary) hypertension: Secondary | ICD-10-CM | POA: Diagnosis present

## 2021-12-14 DIAGNOSIS — R0902 Hypoxemia: Secondary | ICD-10-CM | POA: Diagnosis present

## 2021-12-14 DIAGNOSIS — K862 Cyst of pancreas: Secondary | ICD-10-CM | POA: Diagnosis present

## 2021-12-14 DIAGNOSIS — E785 Hyperlipidemia, unspecified: Secondary | ICD-10-CM

## 2021-12-14 HISTORY — DX: Alcohol induced acute pancreatitis without necrosis or infection: K85.20

## 2021-12-14 LAB — URINALYSIS, ROUTINE W REFLEX MICROSCOPIC
Bilirubin Urine: NEGATIVE
Glucose, UA: NEGATIVE mg/dL
Hgb urine dipstick: NEGATIVE
Ketones, ur: NEGATIVE mg/dL
Leukocytes,Ua: NEGATIVE
Nitrite: NEGATIVE
Protein, ur: NEGATIVE mg/dL
Specific Gravity, Urine: 1.02 (ref 1.005–1.030)
pH: 6.5 (ref 5.0–8.0)

## 2021-12-14 MED ORDER — ACETAMINOPHEN 325 MG PO TABS
650.0000 mg | ORAL_TABLET | Freq: Once | ORAL | Status: AC
Start: 2021-12-14 — End: 2021-12-14
  Administered 2021-12-14: 650 mg via ORAL
  Filled 2021-12-14: qty 2

## 2021-12-14 MED ORDER — SODIUM CHLORIDE 0.9 % IV SOLN: INTRAVENOUS | Status: DC

## 2021-12-14 MED ORDER — HYDROMORPHONE HCL 1 MG/ML IJ SOLN
1.0000 mg | INTRAMUSCULAR | Status: DC | PRN
  Administered 2021-12-14: 1 mg via INTRAVENOUS
  Filled 2021-12-14 (×2): qty 1

## 2021-12-14 MED ORDER — ENOXAPARIN SODIUM 40 MG/0.4ML IJ SOSY
40.0000 mg | PREFILLED_SYRINGE | INTRAMUSCULAR | Status: DC
  Administered 2021-12-14 – 2021-12-16 (×3): 40 mg via SUBCUTANEOUS
  Filled 2021-12-14 (×3): qty 0.4

## 2021-12-14 MED ORDER — MORPHINE SULFATE (PF) 4 MG/ML IV SOLN
4.0000 mg | Freq: Once | INTRAVENOUS | Status: AC
  Administered 2021-12-14: 4 mg via INTRAVENOUS
  Filled 2021-12-14: qty 1

## 2021-12-14 MED ORDER — HYDROMORPHONE HCL 1 MG/ML IJ SOLN
1.0000 mg | Freq: Once | INTRAMUSCULAR | Status: AC
  Administered 2021-12-14: 1 mg via INTRAVENOUS
  Filled 2021-12-14: qty 1

## 2021-12-14 MED ORDER — ONDANSETRON HCL 4 MG/2ML IJ SOLN
4.0000 mg | Freq: Four times a day (QID) | INTRAMUSCULAR | Status: DC | PRN
  Administered 2021-12-14: 4 mg via INTRAVENOUS
  Filled 2021-12-14 (×2): qty 2

## 2021-12-14 MED ORDER — MORPHINE SULFATE (PF) 4 MG/ML IV SOLN
4.0000 mg | INTRAVENOUS | Status: DC | PRN
  Administered 2021-12-14 – 2021-12-15 (×5): 4 mg via INTRAVENOUS
  Filled 2021-12-14 (×5): qty 1

## 2021-12-14 NOTE — ED Notes (Signed)
Portable Xray at bedside.

## 2021-12-14 NOTE — ED Notes (Signed)
PHONE HANDOFF REPORT PROVIDED TO Karen Chafe

## 2021-12-14 NOTE — ED Provider Notes (Signed)
I assumed care of this patient.  Please see previous provider note for further details of Hx, PE.  Briefly patient is a 51 y.o. male who presented abd pain consistent with prior pancreatitis.  Noted to have elevated lipase greater than prior, currently pending CT scan.  CT consistent with pancreatitis. Patient given additional IV fluids and pain medicine. Will be admitted for continued management.       Fatima Blank, MD 12/14/21 [phone]

## 2021-12-14 NOTE — H&P (Signed)
History and Physical    Karim Aiello UUE:[id number] DOB: 12-14-1970 DOA: 12/13/2021  PCP: Willeen Niece, PA  Patient coming from: [facility]  I have personally briefly reviewed patient's old medical records in [place]  Chief Complaint: Abdominal pain  HPI: Max Ayers is a 51 y.o. male with medical history significant for pancreatic cysts, alcoholic pancreatitis, hypertension and hyperlipidemia who presents with from outside ED for pancreatitis.  Patient reports having a nagging right upper quadrant pain starting last week.  Then over the weekend he ate a fish sandwich at [employer] and had worsening abdominal pain.  He took 4 Advil today and felt like pain got exponentially worse and decided to present to the ED.  He says that he has cut down on his alcohol use and had a glass of wine a week ago. Started on new cholesterol med in the past few weeks. He has known pancreatic head lesion with MRI abdomen in April. He has repeat MRI pending on 1/12.   CT of the abdomen and pelvis today shows acute pancreatitis with persistent lesion at the proximal pancreas.  There is borderline CBD at 9 mm but no gallstones or findings of cholecystitis.  In the ED, he was afebrile, mildly bradycardic and was transferred here on 2 L without documented hypoxia.  CBC was unremarkable.  CMP was unremarkable.  Lipase of 1585.  Review of Systems: Constitutional:no Fever Eyes: No Vision Changes Cardiovascular: No Chest Pain Respiratory: No Cough Gastrointestinal: +Nausea, No Vomiting, No Diarrhea, No Constipation, +Pain Genitourinary: no Urinary Incontinence Musculoskeletal: No Arthralgias, No Myalgias Skin: No Skin Lesions, No Pruritus, Neuro: no Weakness,  Psych:  no decrease appetite Heme/Lymph: No Bruising, No Bleeding  Past Medical History:  Diagnosis Date   Hypertension    Pancreatitis     Past Surgical History:  Procedure Laterality Date   NO PAST SURGERIES       reports that he has  quit smoking. His smoking use included cigarettes and cigars. He smoked an average of 1 pack per day. He has never used smokeless tobacco. He reports current alcohol use. He reports that he does not use drugs. Social History  Allergies  Allergen Reactions   Yellow Jacket Venom [Bee Venom] Anaphylaxis    Family History  Problem Relation Age of Onset   Diabetes Mother    Hypertension Father    Breast cancer Maternal Grandmother    Colon cancer Neg Hx    Stomach cancer Neg Hx    Liver disease Neg Hx    Pancreatic cancer Neg Hx    Esophageal cancer Neg Hx    Rectal cancer Neg Hx      Prior to Admission medications   Medication Sig Start Date End Date Taking? Authorizing Provider  acetaminophen (TYLENOL) 325 MG tablet Take 2 tablets (650 mg total) by mouth every 6 (six) hours as needed for mild pain (or Fever >/= 101). 09/20/21   Arrien, Jimmy Picket, MD  ondansetron (ZOFRAN ODT) 4 MG disintegrating tablet Take 1 tablet (4 mg total) by mouth every 8 (eight) hours as needed for nausea or vomiting. 10/11/21   Caren Griffins, MD  oxyCODONE-acetaminophen (PERCOCET) 7.5-325 MG tablet Take 1 tablet by mouth every 6 (six) hours as needed for severe pain. 10/11/21   Caren Griffins, MD  pantoprazole (PROTONIX) 40 MG tablet Take 1 tablet (40 mg total) by mouth daily for 15 days. Patient taking differently: Take 40 mg by mouth daily as needed (indigestion). 09/21/21 10/08/21  Arrien,  Jimmy Picket, MD  polyethylene glycol (MIRALAX / GLYCOLAX) 17 g packet Take 17 g by mouth daily as needed. Patient taking differently: Take 17 g by mouth daily as needed for mild constipation. 09/20/21   Tawni Millers, MD    Physical Exam: Vitals:   12/14/21 1410 12/14/21 1530 12/14/21 1700 12/14/21 1739  BP: (!) 119/40 (!) 159/83 (!) 163/91   Pulse: 82 74 72 73  Resp:  16 15 16   Temp:      TempSrc:      SpO2:  91% 93% 98%  Weight:      Height:        Constitutional: NAD, calm,  comfortable, middle-age male laying in bed Vitals:   12/14/21 1410 12/14/21 1530 12/14/21 1700 12/14/21 1739  BP: (!) 119/40 (!) 159/83 (!) 163/91   Pulse: 82 74 72 73  Resp:  16 15 16   Temp:      TempSrc:      SpO2:  91% 93% 98%  Weight:      Height:       Eyes:  lids and conjunctivae normal ENMT: Mucous membranes are moist.  Neck: normal, supple Respiratory: clear to auscultation bilaterally, no wheezing, no crackles. Normal respiratory effort. Cardiovascular: Regular rate and rhythm, no murmurs / rubs / gallops. Abdomen: Soft, distended, moderate tenderness with palpation of the epigastric and right upper quadrant.  No rebound tenderness or guarding or rigidity.  No masses palpated.  Bowel sounds positive throughout. Musculoskeletal: no clubbing / cyanosis. Good ROM, no contractures. Normal muscle tone.  Skin: no rashes, lesions, ulcers. No induration Neurologic: CN 2-12 grossly intact.  Strength 5/5 in all 4.  Psychiatric: Normal judgment and insight. Alert and oriented x 3. Normal mood.     Labs on Admission: I have personally reviewed following labs and imaging studies  CBC: Recent Labs  Lab 12/13/21 2058  WBC 7.1  HGB 14.8  HCT 42.8  MCV 85.8  PLT 761   Basic Metabolic Panel: Recent Labs  Lab 12/13/21 2058  NA 137  K 3.8  CL 104  CO2 23  GLUCOSE 103*  BUN 14  CREATININE 0.96  CALCIUM 8.9   GFR: Estimated Creatinine Clearance: 107.3 mL/min (by C-G formula based on SCr of 0.96 mg/dL). Liver Function Tests: Recent Labs  Lab 12/13/21 2058  AST 27  ALT 27  ALKPHOS 67  BILITOT 0.6  PROT 7.6  ALBUMIN 4.1   Recent Labs  Lab 12/13/21 2058  LIPASE 1,585*   No results for input(s): AMMONIA in the last 168 hours. Coagulation Profile: No results for input(s): INR, PROTIME in the last 168 hours. Cardiac Enzymes: No results for input(s): CKTOTAL, CKMB, CKMBINDEX, TROPONINI in the last 168 hours. BNP (last 3 results) No results for input(s): PROBNP in  the last 8760 hours. HbA1C: No results for input(s): HGBA1C in the last 72 hours. CBG: No results for input(s): GLUCAP in the last 168 hours. Lipid Profile: No results for input(s): CHOL, HDL, LDLCALC, TRIG, CHOLHDL, LDLDIRECT in the last 72 hours. Thyroid Function Tests: No results for input(s): TSH, T4TOTAL, FREET4, T3FREE, THYROIDAB in the last 72 hours. Anemia Panel: No results for input(s): VITAMINB12, FOLATE, FERRITIN, TIBC, IRON, RETICCTPCT in the last 72 hours. Urine analysis:    Component Value Date/Time   COLORURINE YELLOW 12/14/2021 0012   APPEARANCEUR CLEAR 12/14/2021 0012   LABSPEC 1.020 12/14/2021 0012   PHURINE 6.5 12/14/2021 0012   GLUCOSEU NEGATIVE 12/14/2021 0012   HGBUR NEGATIVE 12/14/2021 0012  BILIRUBINUR NEGATIVE 12/14/2021 0012   KETONESUR NEGATIVE 12/14/2021 0012   PROTEINUR NEGATIVE 12/14/2021 0012   UROBILINOGEN 0.2 10/27/2011 0957   NITRITE NEGATIVE 12/14/2021 0012   LEUKOCYTESUR NEGATIVE 12/14/2021 0012    Radiological Exams on Admission: CT ABDOMEN PELVIS W CONTRAST  Result Date: 12/13/2021 CLINICAL DATA:  Abdominal pain, acute, nonlocalized pancreatitis EXAM: CT ABDOMEN AND PELVIS WITH CONTRAST TECHNIQUE: Multidetector CT imaging of the abdomen and pelvis was performed using the standard protocol following bolus administration of intravenous contrast. CONTRAST:  176mL OMNIPAQUE IOHEXOL 300 MG/ML  SOLN COMPARISON:  CT abdomen pelvis 10/08/2021 FINDINGS: Lower chest: No acute abnormality. Hepatobiliary: No focal liver abnormality. No gallstones, gallbladder wall thickening, or pericholecystic fluid. Borderline enlarged common bile duct measuring up to 9 mm. Pancreas: Hazy pancreatic contour with associated peripancreatic fat stranding diffusely. Trace free fluid. There is a 1.5 cm hypodense lesion within the proximal pancreas. Spleen: Normal in size without focal abnormality. Adrenals/Urinary Tract: No adrenal nodule bilaterally. Bilateral kidneys enhance  symmetrically. No hydronephrosis. No hydroureter. The urinary bladder is unremarkable. On delayed imaging, there is no urothelial wall thickening and there are no filling defects in the opacified portions of the bilateral collecting systems or ureters. Stomach/Bowel: Stomach is within normal limits. No evidence of bowel wall thickening or dilatation. Appendix appears normal. Vascular/Lymphatic: No abdominal aorta or iliac aneurysm. Mild atherosclerotic plaque of the aorta and its branches. No abdominal, pelvic, or inguinal lymphadenopathy. Reproductive: Prostate is unremarkable. Other: No intraperitoneal free fluid. No intraperitoneal free gas. No organized fluid collection. Musculoskeletal: No abdominal wall hernia or abnormality. No suspicious lytic or blastic osseous lesions. No acute displaced fracture. Multilevel degenerative changes of the spine. IMPRESSION: 1. Acute pancreatitis. 2. A 1.8 cm fluid density within the proximal pancreas. Finding may be related to pancreatic duct dilatation versus pseudocyst versus underlying pancreatic lesion. Consider MRI pancreatic protocol status post complete resolution of inflammatory changes/pancreatitis. 3. Borderline enlarged common bile duct measuring up to 9 mm. 4.  Aortic Atherosclerosis (ICD10-I70.0). Electronically Signed   By: Iven Finn M.D.   On: 12/13/2021 23:43   DG Chest Port 1 View  Result Date: 12/14/2021 CLINICAL DATA:  Hypoxia. EXAM: PORTABLE CHEST 1 VIEW COMPARISON:  August 20, 2006 FINDINGS: Multiple overlying radiopaque cardiac lead wires are seen. The cardiac silhouette is mildly enlarged. Both lungs are clear. The visualized skeletal structures are unremarkable. IMPRESSION: No acute cardiopulmonary disease. Electronically Signed   By: Virgina Norfolk M.D.   On: 12/14/2021 03:43      Assessment/Plan  Acute pancreatitis  He has hx of alcohol pancreatitis but last drink this time was over a week ago. Had triglyceride checked on 12/22  at 110.  -recently started on simvastatin  several weeks ago which could potentially be the cause - continue aggressive IV fluid hydration - Full liquid diet and advance as tolerated  Pancreatic cyst Known since April 2022  continue outpatient follow with repeat MRI   HLD -hold simvastatin which was started 3 weeks since there are high case reports of precipitating acute pancreatitis  DVT prophylaxis:.Lovenox Code Status: Full Family Communication: Plan discussed with patient at bedside  disposition Plan: Home with at least 2 midnight stays  Consults called:  Admission status: inpatient  Level of care: Telemetry  Status is: Inpatient  Remains inpatient appropriate because: Admit - It is my clinical opinion that admission to INPATIENT is reasonable and necessary because this patient will require at least 2 midnights in the hospital to treat this condition based on  the medical complexity of the problems presented.  Given the aforementioned information, the predictability of an adverse outcome is felt to be significant.         Orene Desanctis DO [facility]   If 7PM-7AM, please contact night-coverage [email or web address]   12/14/2021, 6:49 PM

## 2021-12-14 NOTE — ED Notes (Signed)
Pt updated to bed assignment status, pending transfer. No questions or concerns. Will continue to monitor.

## 2021-12-14 NOTE — Progress Notes (Signed)
Patient: Max Ayers, Max Ayers  DOB: March 11, 2071 JSH:[id number] Transferring facility: [facility] Requesting provider: Dr. Leonette Monarch (EDP at [facility]) Reason for transfer: admission for further evaluation and management of acute alcoholic pancreatitis.  51 year old male with history of chronic alcohol abuse who presents to [place] ED complaining of 2 to 3 days of progressive abdominal discomfort, with elevated lipase of 1500 as well as CT abdomen/pelvis showing evidence of pancreatic inflammation consistent with acute pancreatitis. VSS. Unclear at this time when most recent alcohol consumption occurred, but reportedly no overt evidence of active alcohol withdrawal at this time.   Subsequently, I accepted this patient for transfer for inpatient admission to a med telemetry bed at [facility] for further work-up and management of suspected acute alcoholic pancreatitis.     Babs Bertin, DO Hospitalist

## 2021-12-14 NOTE — ED Notes (Signed)
Pt declines need for pain medication at this time. Pt will use call bell if any needs arise

## 2021-12-15 DIAGNOSIS — E782 Mixed hyperlipidemia: Secondary | ICD-10-CM | POA: Diagnosis not present

## 2021-12-15 DIAGNOSIS — K853 Drug induced acute pancreatitis without necrosis or infection: Secondary | ICD-10-CM | POA: Diagnosis not present

## 2021-12-15 DIAGNOSIS — K862 Cyst of pancreas: Secondary | ICD-10-CM | POA: Diagnosis not present

## 2021-12-15 LAB — BASIC METABOLIC PANEL
Anion gap: 4 — ABNORMAL LOW (ref 5–15)
BUN: 7 mg/dL (ref 6–20)
CO2: 26 mmol/L (ref 22–32)
Calcium: 8.1 mg/dL — ABNORMAL LOW (ref 8.9–10.3)
Chloride: 107 mmol/L (ref 98–111)
Creatinine, Ser: 0.86 mg/dL (ref 0.61–1.24)
GFR, Estimated: >60 mL/min (ref >60)
Glucose, Bld: 101 mg/dL — ABNORMAL HIGH (ref 70–99)
Potassium: 3.7 mmol/L (ref 3.5–5.1)
Sodium: 137 mmol/L (ref 135–145)

## 2021-12-15 LAB — HIV ANTIBODY (ROUTINE TESTING W REFLEX): HIV Screen 4th Generation wRfx: NONREACTIVE

## 2021-12-15 LAB — MAGNESIUM: Magnesium: 1.9 mg/dL (ref 1.7–2.4)

## 2021-12-15 MED ORDER — POTASSIUM CHLORIDE CRYS ER 20 MEQ PO TBCR
40.0000 meq | EXTENDED_RELEASE_TABLET | Freq: Once | ORAL | Status: AC
  Administered 2021-12-15: 40 meq via ORAL
  Filled 2021-12-15: qty 2

## 2021-12-15 MED ORDER — SODIUM CHLORIDE 0.9 % IV BOLUS
1000.0000 mL | Freq: Once | INTRAVENOUS | Status: AC
  Administered 2021-12-15: 1000 mL via INTRAVENOUS

## 2021-12-15 MED ORDER — SODIUM CHLORIDE 0.9 % IV SOLN
1000.0000 mL | INTRAVENOUS | Status: DC
  Administered 2021-12-15 – 2021-12-16 (×5): 1000 mL via INTRAVENOUS

## 2021-12-15 NOTE — Plan of Care (Signed)
  Problem: Activity: Goal: Risk for activity intolerance will decrease Outcome: Progressing   Problem: Nutrition: Goal: Adequate nutrition will be maintained Outcome: Progressing   Problem: Coping: Goal: Level of anxiety will decrease Outcome: Progressing   

## 2021-12-15 NOTE — Clinical Social Work Note (Signed)
°  Transition of Care [facility]) Screening Note   Patient Details  Name: Max Ayers Date of Birth: February 01, 1971   Transition of Care [facility]) CM/SW Contact:    Trish Mage, LCSW Phone Number: 12/15/2021, 8:52 AM    Transition of Care Department [facility]) has reviewed patient and no TOC needs have been identified at this time. We will continue to monitor patient advancement through interdisciplinary progression rounds. If new patient transition needs arise, please place a TOC consult.

## 2021-12-15 NOTE — Progress Notes (Signed)
PROGRESS NOTE    Max Ayers  VQM:[id number] DOB: [birth year] DOA: 12/13/2021 PCP: Willeen Niece, PA    Chief Complaint  Patient presents with   Abdominal Pain    Brief Narrative:  Patient is a pleasant 51 year old gentleman history of pancreatic cyst, alcoholic pancreatitis, hypertension, hyperlipidemia presenting to the ED with right upper quadrant abdominal pain lasting a week, ate a fish sandwich with worsening abdominal pain.  States has decreased alcohol use significantly last use was a week prior to admission.  Noted to have been started new cholesterol medication few weeks ago.  Noted to have a pancreatic head lesion with MRI abdomen in April with repeat pending 12/23/2020.  CT abdomen and pelvis done consistent with acute pancreatitis.  Patient with borderline CBD at 9 mm but no gallstones or findings of cholecystitis on CT scan.  Patient noted to have a lipase level elevated at 1585.  Patient admitted and being treated for acute pancreatitis.   Assessment & Plan:   Active Problems:   Acute pancreatitis   HLD (hyperlipidemia)   Pancreatic cyst   #1 acute pancreatitis -Questionable etiology. -Felt potentially secondary to recently started simvastatin which was started a few weeks ago. -Lipase elevated at 1585. -CT scan with borderline CBD at 9 mm but no gallstones or findings of cholecystitis noted on CT scan. -LFTs within normal limits. -Patient slowly improving clinically. -Continue to hold statin. -1 L normal saline bolus. -Increase IV fluids to normal saline at 150 cc an hour. -Continue full liquid diet. -IV antiemetics, pain management, supportive care.  2.  Hyperlipidemia -Hold simvastatin which was started 3 weeks ago due to concern may have precipitated patient's acute pancreatitis. -Outpatient follow-up.  3.  Pancreatic cyst -Known since April 2022. -Outpatient follow-up with repeat MRI.   DVT prophylaxis: Lovenox Code Status: Full Family  Communication: Updated patient.  No family at bedside. Disposition:   Status is: Inpatient  Remains inpatient appropriate because: Severity of illness       Consultants:  None  Procedures:  CT abdomen and pelvis 12/13/2021 Chest x-ray 12/14/2021  Antimicrobials:  None   Subjective: Laying in bed.  States overall feeling better than he did on admission.  Some nausea but no emesis.  Still with upper abdominal pain.  Tolerating full liquid diet.  Objective: Vitals:   12/14/21 1850 12/14/21 2244 12/15/21 0234 12/15/21 0646  BP: (!) 141/87 (!) 144/91 129/67 137/78  Pulse: 64 60 70 68  Resp: 20 20 20 18   Temp: 98.2 F (36.8 C) 98.1 F (36.7 C) 98 F (36.7 C) 98.7 F (37.1 C)  TempSrc: Oral Oral Oral   SpO2: 100% 99% 100% 100%  Weight:      Height:        Intake/Output Summary (Last 24 hours) at 12/15/2021 1221 Last data filed at 12/15/2021 0600 Gross per 24 hour  Intake 1236.61 ml  Output --  Net 1236.61 ml   Filed Weights   12/13/21 2054  Weight: 90.7 kg    Examination:  General exam: Appears calm and comfortable  Respiratory system: Clear to auscultation. Respiratory effort normal. Cardiovascular system: S1 & S2 heard, RRR. No JVD, murmurs, rubs, gallops or clicks. No pedal edema. Gastrointestinal system: Abdomen tender to palpation epigastrium, right upper quadrant, mid abdominal region.  Positive bowel sounds.  No rebound.  No guarding. Central nervous system: Alert and oriented. No focal neurological deficits. Extremities: Symmetric 5 x 5 power. Skin: No rashes, lesions or ulcers Psychiatry: Judgement and insight appear normal. Mood &  affect appropriate.     Data Reviewed: I have personally reviewed following labs and imaging studies  CBC: Recent Labs  Lab 12/13/21 2058  WBC 7.1  HGB 14.8  HCT 42.8  MCV 85.8  PLT 259    Basic Metabolic Panel: Recent Labs  Lab 12/13/21 2058 12/15/21 0502  NA 137 137  K 3.8 3.7  CL 104 107  CO2 23 26   GLUCOSE 103* 101*  BUN 14 7  CREATININE 0.96 0.86  CALCIUM 8.9 8.1*  MG  --  1.9    GFR: Estimated Creatinine Clearance: 119.8 mL/min (by C-G formula based on SCr of 0.86 mg/dL).  Liver Function Tests: Recent Labs  Lab 12/13/21 2058  AST 27  ALT 27  ALKPHOS 67  BILITOT 0.6  PROT 7.6  ALBUMIN 4.1    CBG: No results for input(s): GLUCAP in the last 168 hours.   Recent Results (from the past 240 hour(s))  Resp Panel by RT-PCR (Flu A&B, Covid) Nasopharyngeal Swab     Status: None   Collection Time: 12/13/21 10:54 PM   Specimen: Nasopharyngeal Swab; Nasopharyngeal(NP) swabs in vial transport medium  Result Value Ref Range Status   SARS Coronavirus 2 by RT PCR NEGATIVE NEGATIVE Final    Comment: (NOTE) SARS-CoV-2 target nucleic acids are NOT DETECTED.  The SARS-CoV-2 RNA is generally detectable in upper respiratory specimens during the acute phase of infection. The lowest concentration of SARS-CoV-2 viral copies this assay can detect is 138 copies/mL. A negative result does not preclude SARS-Cov-2 infection and should not be used as the sole basis for treatment or other patient management decisions. A negative result may occur with  improper specimen collection/handling, submission of specimen other than nasopharyngeal swab, presence of viral mutation(s) within the areas targeted by this assay, and inadequate number of viral copies(<138 copies/mL). A negative result must be combined with clinical observations, patient history, and epidemiological information. The expected result is Negative.  Fact Sheet for Patients:  EntrepreneurPulse.com.au  Fact Sheet for Healthcare Providers:  IncredibleEmployment.be  This test is no t yet approved or cleared by the [place] FDA and  has been authorized for detection and/or diagnosis of SARS-CoV-2 by FDA under an Emergency Use Authorization (EUA). This EUA will remain  in effect (meaning  this test can be used) for the duration of the COVID-19 declaration under Section 564(b)(1) of the Act, 21 U.S.C.section 360bbb-3(b)(1), unless the authorization is terminated  or revoked sooner.       Influenza A by PCR NEGATIVE NEGATIVE Final   Influenza B by PCR NEGATIVE NEGATIVE Final    Comment: (NOTE) The Xpert Xpress SARS-CoV-2/FLU/RSV plus assay is intended as an aid in the diagnosis of influenza from Nasopharyngeal swab specimens and should not be used as a sole basis for treatment. Nasal washings and aspirates are unacceptable for Xpert Xpress SARS-CoV-2/FLU/RSV testing.  Fact Sheet for Patients: EntrepreneurPulse.com.au  Fact Sheet for Healthcare Providers: IncredibleEmployment.be  This test is not yet approved or cleared by the [place] FDA and has been authorized for detection and/or diagnosis of SARS-CoV-2 by FDA under an Emergency Use Authorization (EUA). This EUA will remain in effect (meaning this test can be used) for the duration of the COVID-19 declaration under Section 564(b)(1) of the Act, 21 U.S.C. section 360bbb-3(b)(1), unless the authorization is terminated or revoked.  Performed at [facility], [address] [place], [place] [address]          Radiology Studies: CT ABDOMEN  PELVIS W CONTRAST  Result Date: 12/13/2021 CLINICAL DATA:  Abdominal pain, acute, nonlocalized pancreatitis EXAM: CT ABDOMEN AND PELVIS WITH CONTRAST TECHNIQUE: Multidetector CT imaging of the abdomen and pelvis was performed using the standard protocol following bolus administration of intravenous contrast. CONTRAST:  157mL OMNIPAQUE IOHEXOL 300 MG/ML  SOLN COMPARISON:  CT abdomen pelvis 10/08/2021 FINDINGS: Lower chest: No acute abnormality. Hepatobiliary: No focal liver abnormality. No gallstones, gallbladder wall thickening, or pericholecystic fluid. Borderline enlarged common bile duct measuring up to 9 mm. Pancreas: Hazy  pancreatic contour with associated peripancreatic fat stranding diffusely. Trace free fluid. There is a 1.5 cm hypodense lesion within the proximal pancreas. Spleen: Normal in size without focal abnormality. Adrenals/Urinary Tract: No adrenal nodule bilaterally. Bilateral kidneys enhance symmetrically. No hydronephrosis. No hydroureter. The urinary bladder is unremarkable. On delayed imaging, there is no urothelial wall thickening and there are no filling defects in the opacified portions of the bilateral collecting systems or ureters. Stomach/Bowel: Stomach is within normal limits. No evidence of bowel wall thickening or dilatation. Appendix appears normal. Vascular/Lymphatic: No abdominal aorta or iliac aneurysm. Mild atherosclerotic plaque of the aorta and its branches. No abdominal, pelvic, or inguinal lymphadenopathy. Reproductive: Prostate is unremarkable. Other: No intraperitoneal free fluid. No intraperitoneal free gas. No organized fluid collection. Musculoskeletal: No abdominal wall hernia or abnormality. No suspicious lytic or blastic osseous lesions. No acute displaced fracture. Multilevel degenerative changes of the spine. IMPRESSION: 1. Acute pancreatitis. 2. A 1.8 cm fluid density within the proximal pancreas. Finding may be related to pancreatic duct dilatation versus pseudocyst versus underlying pancreatic lesion. Consider MRI pancreatic protocol status post complete resolution of inflammatory changes/pancreatitis. 3. Borderline enlarged common bile duct measuring up to 9 mm. 4.  Aortic Atherosclerosis (ICD10-I70.0). Electronically Signed   By: Iven Finn M.D.   On: 12/13/2021 23:43   DG Chest Port 1 View  Result Date: 12/14/2021 CLINICAL DATA:  Hypoxia. EXAM: PORTABLE CHEST 1 VIEW COMPARISON:  August 20, 2006 FINDINGS: Multiple overlying radiopaque cardiac lead wires are seen. The cardiac silhouette is mildly enlarged. Both lungs are clear. The visualized skeletal structures are  unremarkable. IMPRESSION: No acute cardiopulmonary disease. Electronically Signed   By: Virgina Norfolk M.D.   On: 12/14/2021 03:43        Scheduled Meds:  enoxaparin (LOVENOX) injection  40 mg Subcutaneous Q24H   Continuous Infusions:  sodium chloride       LOS: 1 day    Time spent: 35 minutes    Irine Seal, MD [facility]   To contact the attending provider between 7A-7P or the covering provider during after hours 7P-7A, please log into the web site [email or web address] and access using universal [place] password for that web site. If you do not have the password, please call the hospital operator.  12/15/2021, 12:21 PM

## 2021-12-16 DIAGNOSIS — K853 Drug induced acute pancreatitis without necrosis or infection: Secondary | ICD-10-CM | POA: Diagnosis not present

## 2021-12-16 DIAGNOSIS — K5900 Constipation, unspecified: Secondary | ICD-10-CM

## 2021-12-16 DIAGNOSIS — E782 Mixed hyperlipidemia: Secondary | ICD-10-CM | POA: Diagnosis not present

## 2021-12-16 DIAGNOSIS — K862 Cyst of pancreas: Secondary | ICD-10-CM | POA: Diagnosis not present

## 2021-12-16 LAB — COMPREHENSIVE METABOLIC PANEL
ALT: 21 U/L (ref 0–44)
AST: 20 U/L (ref 15–41)
Albumin: 3.2 g/dL — ABNORMAL LOW (ref 3.5–5.0)
Alkaline Phosphatase: 58 U/L (ref 38–126)
Anion gap: 6 (ref 5–15)
BUN: 7 mg/dL (ref 6–20)
CO2: 25 mmol/L (ref 22–32)
Calcium: 8.1 mg/dL — ABNORMAL LOW (ref 8.9–10.3)
Chloride: 107 mmol/L (ref 98–111)
Creatinine, Ser: 0.96 mg/dL (ref 0.61–1.24)
GFR, Estimated: >60 mL/min (ref >60)
Glucose, Bld: 102 mg/dL — ABNORMAL HIGH (ref 70–99)
Potassium: 3.6 mmol/L (ref 3.5–5.1)
Sodium: 138 mmol/L (ref 135–145)
Total Bilirubin: 0.6 mg/dL (ref 0.3–1.2)
Total Protein: 5.9 g/dL — ABNORMAL LOW (ref 6.5–8.1)

## 2021-12-16 LAB — CBC
HCT: 35.4 % — ABNORMAL LOW (ref 39.0–52.0)
Hemoglobin: 12 g/dL — ABNORMAL LOW (ref 13.0–17.0)
MCH: 29.5 pg (ref 26.0–34.0)
MCHC: 33.9 g/dL (ref 30.0–36.0)
MCV: 87 fL (ref 80.0–100.0)
Platelets: 168 10*3/uL (ref 150–400)
RBC: 4.07 MIL/uL — ABNORMAL LOW (ref 4.22–5.81)
RDW: 13.2 % (ref 11.5–15.5)
WBC: 4.5 10*3/uL (ref 4.0–10.5)
nRBC: 0 % (ref 0.0–0.2)

## 2021-12-16 LAB — LIPASE, BLOOD: Lipase: 42 U/L (ref 11–51)

## 2021-12-16 LAB — MAGNESIUM: Magnesium: 1.9 mg/dL (ref 1.7–2.4)

## 2021-12-16 MED ORDER — LACTULOSE 10 GM/15ML PO SOLN: 20.0000 g | Freq: Once | ORAL | Status: DC

## 2021-12-16 MED ORDER — SORBITOL 70 % SOLN
30.0000 mL | Freq: Once | Status: AC
  Administered 2021-12-16: 30 mL via ORAL
  Filled 2021-12-16: qty 30

## 2021-12-16 MED ORDER — POTASSIUM CHLORIDE CRYS ER 20 MEQ PO TBCR
40.0000 meq | EXTENDED_RELEASE_TABLET | Freq: Once | ORAL | Status: AC
  Administered 2021-12-16: 40 meq via ORAL
  Filled 2021-12-16: qty 2

## 2021-12-16 MED ORDER — POLYETHYLENE GLYCOL 3350 17 G PO PACK
17.0000 g | PACK | Freq: Every day | ORAL | Status: DC
  Administered 2021-12-16: 17 g via ORAL
  Filled 2021-12-16: qty 1

## 2021-12-16 MED ORDER — POLYETHYLENE GLYCOL 3350 17 G PO PACK
17.0000 g | PACK | Freq: Two times a day (BID) | ORAL | Status: DC
  Administered 2021-12-16 – 2021-12-17 (×2): 17 g via ORAL
  Filled 2021-12-16 (×2): qty 1

## 2021-12-16 MED ORDER — SODIUM CHLORIDE 0.9 % IV SOLN
1000.0000 mL | INTRAVENOUS | Status: DC
  Administered 2021-12-16: 1000 mL via INTRAVENOUS

## 2021-12-16 MED ORDER — SODIUM CHLORIDE 0.9 % IV SOLN: 1000.0000 mL | INTRAVENOUS | Status: DC

## 2021-12-16 MED ORDER — POLYETHYLENE GLYCOL 3350 17 G PO PACK: 17.0000 g | PACK | Freq: Every day | ORAL | 0 refills | Status: DC | PRN

## 2021-12-16 MED ORDER — OXYCODONE HCL 5 MG PO TABS
5.0000 mg | ORAL_TABLET | ORAL | Status: AC | PRN
  Administered 2021-12-16 (×2): 5 mg via ORAL
  Filled 2021-12-16 (×2): qty 1

## 2021-12-16 MED ORDER — OXYCODONE HCL 5 MG PO TABS
5.0000 mg | ORAL_TABLET | ORAL | Status: DC | PRN
  Administered 2021-12-16 – 2021-12-17 (×2): 5 mg via ORAL
  Filled 2021-12-16 (×2): qty 1

## 2021-12-16 MED ORDER — SORBITOL 70 % SOLN
30.0000 mL | Status: AC
  Administered 2021-12-16: 30 mL via ORAL
  Filled 2021-12-16 (×2): qty 30

## 2021-12-16 MED ORDER — SENNOSIDES-DOCUSATE SODIUM 8.6-50 MG PO TABS
1.0000 | ORAL_TABLET | Freq: Two times a day (BID) | ORAL | Status: DC
  Administered 2021-12-16 – 2021-12-17 (×3): 1 via ORAL
  Filled 2021-12-16 (×3): qty 1

## 2021-12-16 MED ORDER — SODIUM CHLORIDE 0.9 % IV SOLN
1000.0000 mL | INTRAVENOUS | Status: DC
  Administered 2021-12-17: 1000 mL via INTRAVENOUS

## 2021-12-16 MED ORDER — PANTOPRAZOLE SODIUM 40 MG PO TBEC: 40.0000 mg | DELAYED_RELEASE_TABLET | Freq: Every day | ORAL | 1 refills | Status: DC

## 2021-12-16 NOTE — Progress Notes (Signed)
PROGRESS NOTE    Max Ayers  PTW:[id number] DOB: 24-Jun-1971 DOA: 12/13/2021 PCP: Willeen Niece, PA    Chief Complaint  Patient presents with   Abdominal Pain    Brief Narrative:  Patient is a pleasant 51 year old gentleman history of pancreatic cyst, alcoholic pancreatitis, hypertension, hyperlipidemia presenting to the ED with right upper quadrant abdominal pain lasting a week, ate a fish sandwich with worsening abdominal pain.  States has decreased alcohol use significantly last use was a week prior to admission.  Noted to have been started new cholesterol medication few weeks ago.  Noted to have a pancreatic head lesion with MRI abdomen in April with repeat pending 12/23/2020.  CT abdomen and pelvis done consistent with acute pancreatitis.  Patient with borderline CBD at 9 mm but no gallstones or findings of cholecystitis on CT scan.  Patient noted to have a lipase level elevated at 1585.  Patient admitted and being treated for acute pancreatitis.   Assessment & Plan:   Active Problems:   Acute pancreatitis   HLD (hyperlipidemia)   Pancreatic cyst   #1 acute pancreatitis -Questionable etiology. -Felt potentially secondary to recently started simvastatin which was started a few weeks ago. -Lipase elevated at 1585 and trending down and improving currently at 42 this morning. -CT scan with borderline CBD at 9 mm but no gallstones or findings of cholecystitis noted on CT scan. -Right upper quadrant ultrasound done 12/12/2020 with no gallstones noted, unremarkable gallbladder. -LFTs within normal limits. -Patient slowly improving clinically. -Continue to hold statin. - decrease IV fluids to 100 cc an hour. -Advance diet from a full liquid diet to a soft diet. -IV antiemetics, pain management, supportive care.  2.  Hyperlipidemia -Hold simvastatin which was started 3 weeks ago due to concern may have precipitated patient's acute pancreatitis. -Statin will be discontinued on  discharge -Outpatient follow-up.  3.  Pancreatic cyst -Known since April 2022. -Outpatient follow-up with repeat MRI.  4.  Constipation -Place on MiraLAX daily, Senokot-S twice daily. -Sorbitol p.o. every 2 hours x2 doses.   DVT prophylaxis: Lovenox Code Status: Full Family Communication: Updated patient.  No family at bedside. Disposition:   Status is: Inpatient  Remains inpatient appropriate because: Severity of illness       Consultants:  None  Procedures:  CT abdomen and pelvis 12/13/2021 Chest x-ray 12/14/2021  Antimicrobials:  None   Subjective: Laying in bed.  Feels better overall.  No nausea or emesis.  Tolerating full liquid diet.  Abdominal pain improved significantly.  Complaining of constipatioN  Objective: Vitals:   12/15/21 0646 12/15/21 1119 12/15/21 2113 12/16/21 0456  BP: 137/78 134/82 121/71 (!) 156/88  Pulse: 68 77 86 84  Resp: 18 16 16 19   Temp: 98.7 F (37.1 C) 98.2 F (36.8 C) 98.5 F (36.9 C) 98.2 F (36.8 C)  TempSrc:  Oral Oral   SpO2: 100% 97% 97% 96%  Weight:    93 kg  Height:        Intake/Output Summary (Last 24 hours) at 12/16/2021 1145 Last data filed at 12/16/2021 0900 Gross per 24 hour  Intake 4088.22 ml  Output --  Net 4088.22 ml    Filed Weights   12/13/21 2054 12/16/21 0456  Weight: 90.7 kg 93 kg    Examination:  General exam: NAD Respiratory system: CTA B.  No wheezes, no crackles, no rhonchi.  Normal respiratory effort.   Cardiovascular system: Regular rate and rhythm no murmurs rubs or gallops.  No JVD.  No lower  extremity edema  Gastrointestinal system: Abdomen less tender to palpation epigastrium and right upper quadrant.  Positive bowel sounds.  No rebound.  No guarding.  Central nervous system: Alert and oriented. No focal neurological deficits. Extremities: Symmetric 5 x 5 power. Skin: No rashes, lesions or ulcers Psychiatry: Judgement and insight appear normal. Mood & affect appropriate.     Data  Reviewed: I have personally reviewed following labs and imaging studies  CBC: Recent Labs  Lab 12/13/21 2058 12/16/21 0538  WBC 7.1 4.5  HGB 14.8 12.0*  HCT 42.8 35.4*  MCV 85.8 87.0  PLT 218 168     Basic Metabolic Panel: Recent Labs  Lab 12/13/21 2058 12/15/21 0502 12/16/21 0538  NA 137 137 138  K 3.8 3.7 3.6  CL 104 107 107  CO2 23 26 25   GLUCOSE 103* 101* 102*  BUN 14 7 7   CREATININE 0.96 0.86 0.96  CALCIUM 8.9 8.1* 8.1*  MG  --  1.9 1.9     GFR: Estimated Creatinine Clearance: 108.5 mL/min (by C-G formula based on SCr of 0.96 mg/dL).  Liver Function Tests: Recent Labs  Lab 12/13/21 2058 12/16/21 0538  AST 27 20  ALT 27 21  ALKPHOS 67 58  BILITOT 0.6 0.6  PROT 7.6 5.9*  ALBUMIN 4.1 3.2*     CBG: No results for input(s): GLUCAP in the last 168 hours.   Recent Results (from the past 240 hour(s))  Resp Panel by RT-PCR (Flu A&B, Covid) Nasopharyngeal Swab     Status: None   Collection Time: 12/13/21 10:54 PM   Specimen: Nasopharyngeal Swab; Nasopharyngeal(NP) swabs in vial transport medium  Result Value Ref Range Status   SARS Coronavirus 2 by RT PCR NEGATIVE NEGATIVE Final    Comment: (NOTE) SARS-CoV-2 target nucleic acids are NOT DETECTED.  The SARS-CoV-2 RNA is generally detectable in upper respiratory specimens during the acute phase of infection. The lowest concentration of SARS-CoV-2 viral copies this assay can detect is 138 copies/mL. A negative result does not preclude SARS-Cov-2 infection and should not be used as the sole basis for treatment or other patient management decisions. A negative result may occur with  improper specimen collection/handling, submission of specimen other than nasopharyngeal swab, presence of viral mutation(s) within the areas targeted by this assay, and inadequate number of viral copies(<138 copies/mL). A negative result must be combined with clinical observations, patient history, and  epidemiological information. The expected result is Negative.  Fact Sheet for Patients:  EntrepreneurPulse.com.au  Fact Sheet for Healthcare Providers:  IncredibleEmployment.be  This test is no t yet approved or cleared by the [place] FDA and  has been authorized for detection and/or diagnosis of SARS-CoV-2 by FDA under an Emergency Use Authorization (EUA). This EUA will remain  in effect (meaning this test can be used) for the duration of the COVID-19 declaration under Section 564(b)(1) of the Act, 21 U.S.C.section 360bbb-3(b)(1), unless the authorization is terminated  or revoked sooner.       Influenza A by PCR NEGATIVE NEGATIVE Final   Influenza B by PCR NEGATIVE NEGATIVE Final    Comment: (NOTE) The Xpert Xpress SARS-CoV-2/FLU/RSV plus assay is intended as an aid in the diagnosis of influenza from Nasopharyngeal swab specimens and should not be used as a sole basis for treatment. Nasal washings and aspirates are unacceptable for Xpert Xpress SARS-CoV-2/FLU/RSV testing.  Fact Sheet for Patients: EntrepreneurPulse.com.au  Fact Sheet for Healthcare Providers: IncredibleEmployment.be  This test is not yet approved or cleared by the  [place] Architectural technologist and has been authorized for detection and/or diagnosis of SARS-CoV-2 by FDA under an Print production planner (EUA). This EUA will remain in effect (meaning this test can be used) for the duration of the COVID-19 declaration under Section 564(b)(1) of the Act, 21 U.S.C. section 360bbb-3(b)(1), unless the authorization is terminated or revoked.  Performed at [facility], [address], [place], [place] [address]           Radiology Studies: No results found.      Scheduled Meds:  enoxaparin (LOVENOX) injection  40 mg Subcutaneous Q24H   polyethylene glycol  17 g Oral Daily   potassium chloride  40 mEq Oral Once    senna-docusate  1 tablet Oral BID   sorbitol  30 mL Oral Q2H   Continuous Infusions:  sodium chloride 1,000 mL (12/16/21 0958)     LOS: 2 days    Time spent: 35 minutes    Irine Seal, MD [facility]   To contact the attending provider between 7A-7P or the covering provider during after hours 7P-7A, please log into the web site [email or web address] and access using universal [place] password for that web site. If you do not have the password, please call the hospital operator.  12/16/2021, 11:45 AM

## 2021-12-17 DIAGNOSIS — K5900 Constipation, unspecified: Secondary | ICD-10-CM | POA: Diagnosis present

## 2021-12-17 DIAGNOSIS — E782 Mixed hyperlipidemia: Secondary | ICD-10-CM | POA: Diagnosis not present

## 2021-12-17 DIAGNOSIS — K862 Cyst of pancreas: Secondary | ICD-10-CM | POA: Diagnosis not present

## 2021-12-17 DIAGNOSIS — K853 Drug induced acute pancreatitis without necrosis or infection: Secondary | ICD-10-CM | POA: Diagnosis not present

## 2021-12-17 LAB — BASIC METABOLIC PANEL
Anion gap: 5 (ref 5–15)
BUN: 8 mg/dL (ref 6–20)
CO2: 23 mmol/L (ref 22–32)
Calcium: 8.5 mg/dL — ABNORMAL LOW (ref 8.9–10.3)
Chloride: 107 mmol/L (ref 98–111)
Creatinine, Ser: 1.04 mg/dL (ref 0.61–1.24)
GFR, Estimated: >60 mL/min (ref >60)
Glucose, Bld: 105 mg/dL — ABNORMAL HIGH (ref 70–99)
Potassium: 3.8 mmol/L (ref 3.5–5.1)
Sodium: 135 mmol/L (ref 135–145)

## 2021-12-17 MED ORDER — OXYCODONE HCL 5 MG PO TABS: 5.0000 mg | ORAL_TABLET | ORAL | 0 refills | Status: DC | PRN

## 2021-12-17 NOTE — Discharge Summary (Signed)
Physician Discharge Summary  Max Ayers YSA:[id number] DOB: [birth year] DOA: 12/13/2021  PCP: Willeen Niece, PA  Admit date: 12/13/2021 Discharge date: 12/17/2021  Time spent: 55 minutes  Recommendations for Outpatient Follow-up:  Follow-up with Willeen Niece, PA as previously scheduled doing 1 to 2 weeks.  On follow-up patient will need a basic metabolic profile done to follow-up on electrolytes and renal function.   Discharge Diagnoses:  Principal Problem:   Acute pancreatitis Active Problems:   HLD (hyperlipidemia)   Pancreatic cyst   Constipation   Discharge Condition: Stable and improved  Diet recommendation: Heart healthy  Filed Weights   12/13/21 2054 12/16/21 0456 12/17/21 0500  Weight: 90.7 kg 93 kg 96.8 kg    History of present illness:  HPI per Dr.Tu  Max Ayers is a 51 y.o. male with medical history significant for pancreatic cysts, alcoholic pancreatitis, hypertension and hyperlipidemia who presents with from outside ED for pancreatitis.   Patient reports having a nagging right upper quadrant pain starting last week.  Then over the weekend he ate a fish sandwich at [employer] and had worsening abdominal pain.  He took 4 Advil today and felt like pain got exponentially worse and decided to present to the ED.  He says that he has cut down on his alcohol use and had a glass of wine a week ago. Started on new cholesterol med in the past few weeks. He has known pancreatic head lesion with MRI abdomen in April. He has repeat MRI pending on 1/12.    CT of the abdomen and pelvis today shows acute pancreatitis with persistent lesion at the proximal pancreas.  There is borderline CBD at 9 mm but no gallstones or findings of cholecystitis.   In the ED, he was afebrile, mildly bradycardic and was transferred here on 2 L without documented hypoxia.  CBC was unremarkable.  CMP was unremarkable.  Lipase of 1585.   Hospital Course:  #1 acute pancreatitis -Questionable  etiology. -Felt potentially secondary to recently started simvastatin which was started a few weeks ago. -Lipase elevated at 1585 and trending down and to 42 during the hospitalization. -CT scan with borderline CBD at 9 mm but no gallstones or findings of cholecystitis noted on CT scan. -Right upper quadrant ultrasound done 12/12/2020 with no gallstones noted, unremarkable gallbladder. -LFTs within normal limits. -Patient improved clinically during the hospitalization, statin discontinued. -Patient placed on clear liquids and diet advanced to soft diet which she tolerated. -Patient also hydrated aggressively. -Patient with discharge in stable and improved condition. -Patient will be discharged on 12 tablets of oxycodone 5 mg every 4 hours as needed pain. -Outpatient follow-up with PCP.  2.  Hyperlipidemia -Held simvastatin which was started 3 weeks ago due to concern may have precipitated patient's acute pancreatitis. -Statin will be discontinued on discharge -Outpatient follow-up.  3.  Pancreatic cyst -Known since April 2022. -Outpatient follow-up with repeat MRI.  4.  Constipation -Patient placed on MiraLAX twice daily, Senokot-S twice daily, given sorbitol with good results.   -Patient was discharged home on MiraLAX as needed.    Procedures: CT abdomen and pelvis 12/13/2021 Chest x-ray 12/14/2021  Consultations: None  Discharge Exam: Vitals:   12/16/21 1924 12/17/21 0439  BP: (!) 154/79 (!) 152/83  Pulse: 79 60  Resp: 16 15  Temp: 98.7 F (37.1 C) 98.1 F (36.7 C)  SpO2: 99% 96%    General: NAD Cardiovascular: Regular rate and rhythm no murmurs rubs or gallops.  No JVD.  No lower extremity  edema Respiratory: Clear to auscultation bilaterally.  No wheezes, no crackles, no rhonchi.  Normal respiratory effort. GI: Abdomen soft, nontender, nondistended, positive bowel sounds.  No rebound.  No guarding.  Discharge Instructions   Discharge Instructions     Diet - low  sodium heart healthy   Complete by: As directed    Increase activity slowly   Complete by: As directed    Increase activity slowly   Complete by: As directed       Allergies as of 12/17/2021       Reactions   Yellow Jacket Venom [bee Venom] Anaphylaxis        Medication List     STOP taking these medications    ondansetron 4 MG disintegrating tablet Commonly known as: Zofran ODT   oxyCODONE-acetaminophen 7.5-325 MG tablet Commonly known as: PERCOCET   simvastatin 20 MG tablet Commonly known as: ZOCOR       TAKE these medications    acetaminophen 325 MG tablet Commonly known as: TYLENOL Take 2 tablets (650 mg total) by mouth every 6 (six) hours as needed for mild pain (or Fever >/= 101).   CLEAR EYES OP Place 1 drop into both eyes 3 (three) times daily as needed (dry eyes).   ibuprofen 200 MG tablet Commonly known as: ADVIL Take 400-800 mg by mouth every 6 (six) hours as needed for mild pain.   oxyCODONE 5 MG immediate release tablet Commonly known as: Oxy IR/ROXICODONE Take 1 tablet (5 mg total) by mouth every 4 (four) hours as needed for moderate pain.   pantoprazole 40 MG tablet Commonly known as: PROTONIX Take 1 tablet (40 mg total) by mouth daily. What changed:  when to take this reasons to take this   polyethylene glycol 17 g packet Commonly known as: MIRALAX / GLYCOLAX Take 17 g by mouth daily as needed.       Allergies  Allergen Reactions   Yellow Jacket Venom [Bee Venom] Anaphylaxis    Follow-up Information     Willeen Niece, [place]. Schedule an appointment as soon as possible for a visit in 1 week(s).   Specialty: Physician Assistant Why: Follow-up as scheduled or in 1 to 2 weeks. Contact information: [address] [place] [address] [place] [id number] [phone]                  The results of significant diagnostics from this hospitalization (including imaging, microbiology, ancillary and laboratory) are listed  below for reference.    Significant Diagnostic Studies: CT ABDOMEN PELVIS W CONTRAST  Result Date: 12/13/2021 CLINICAL DATA:  Abdominal pain, acute, nonlocalized pancreatitis EXAM: CT ABDOMEN AND PELVIS WITH CONTRAST TECHNIQUE: Multidetector CT imaging of the abdomen and pelvis was performed using the standard protocol following bolus administration of intravenous contrast. CONTRAST:  162mL OMNIPAQUE IOHEXOL 300 MG/ML  SOLN COMPARISON:  CT abdomen pelvis 10/08/2021 FINDINGS: Lower chest: No acute abnormality. Hepatobiliary: No focal liver abnormality. No gallstones, gallbladder wall thickening, or pericholecystic fluid. Borderline enlarged common bile duct measuring up to 9 mm. Pancreas: Hazy pancreatic contour with associated peripancreatic fat stranding diffusely. Trace free fluid. There is a 1.5 cm hypodense lesion within the proximal pancreas. Spleen: Normal in size without focal abnormality. Adrenals/Urinary Tract: No adrenal nodule bilaterally. Bilateral kidneys enhance symmetrically. No hydronephrosis. No hydroureter. The urinary bladder is unremarkable. On delayed imaging, there is no urothelial wall thickening and there are no filling defects in the opacified portions of the bilateral collecting systems or ureters. Stomach/Bowel: Stomach is  within normal limits. No evidence of bowel wall thickening or dilatation. Appendix appears normal. Vascular/Lymphatic: No abdominal aorta or iliac aneurysm. Mild atherosclerotic plaque of the aorta and its branches. No abdominal, pelvic, or inguinal lymphadenopathy. Reproductive: Prostate is unremarkable. Other: No intraperitoneal free fluid. No intraperitoneal free gas. No organized fluid collection. Musculoskeletal: No abdominal wall hernia or abnormality. No suspicious lytic or blastic osseous lesions. No acute displaced fracture. Multilevel degenerative changes of the spine. IMPRESSION: 1. Acute pancreatitis. 2. A 1.8 cm fluid density within the proximal  pancreas. Finding may be related to pancreatic duct dilatation versus pseudocyst versus underlying pancreatic lesion. Consider MRI pancreatic protocol status post complete resolution of inflammatory changes/pancreatitis. 3. Borderline enlarged common bile duct measuring up to 9 mm. 4.  Aortic Atherosclerosis (ICD10-I70.0). Electronically Signed   By: Iven Finn M.D.   On: 12/13/2021 23:43   DG Chest Port 1 View  Result Date: 12/14/2021 CLINICAL DATA:  Hypoxia. EXAM: PORTABLE CHEST 1 VIEW COMPARISON:  August 20, 2006 FINDINGS: Multiple overlying radiopaque cardiac lead wires are seen. The cardiac silhouette is mildly enlarged. Both lungs are clear. The visualized skeletal structures are unremarkable. IMPRESSION: No acute cardiopulmonary disease. Electronically Signed   By: Virgina Norfolk M.D.   On: 12/14/2021 03:43    Microbiology: Recent Results (from the past 240 hour(s))  Resp Panel by RT-PCR (Flu A&B, Covid) Nasopharyngeal Swab     Status: None   Collection Time: 12/13/21 10:54 PM   Specimen: Nasopharyngeal Swab; Nasopharyngeal(NP) swabs in vial transport medium  Result Value Ref Range Status   SARS Coronavirus 2 by RT PCR NEGATIVE NEGATIVE Final    Comment: (NOTE) SARS-CoV-2 target nucleic acids are NOT DETECTED.  The SARS-CoV-2 RNA is generally detectable in upper respiratory specimens during the acute phase of infection. The lowest concentration of SARS-CoV-2 viral copies this assay can detect is 138 copies/mL. A negative result does not preclude SARS-Cov-2 infection and should not be used as the sole basis for treatment or other patient management decisions. A negative result may occur with  improper specimen collection/handling, submission of specimen other than nasopharyngeal swab, presence of viral mutation(s) within the areas targeted by this assay, and inadequate number of viral copies(<138 copies/mL). A negative result must be combined with clinical observations,  patient history, and epidemiological information. The expected result is Negative.  Fact Sheet for Patients:  EntrepreneurPulse.com.au  Fact Sheet for Healthcare Providers:  IncredibleEmployment.be  This test is no t yet approved or cleared by the [place] FDA and  has been authorized for detection and/or diagnosis of SARS-CoV-2 by FDA under an Emergency Use Authorization (EUA). This EUA will remain  in effect (meaning this test can be used) for the duration of the COVID-19 declaration under Section 564(b)(1) of the Act, 21 U.S.C.section 360bbb-3(b)(1), unless the authorization is terminated  or revoked sooner.       Influenza A by PCR NEGATIVE NEGATIVE Final   Influenza B by PCR NEGATIVE NEGATIVE Final    Comment: (NOTE) The Xpert Xpress SARS-CoV-2/FLU/RSV plus assay is intended as an aid in the diagnosis of influenza from Nasopharyngeal swab specimens and should not be used as a sole basis for treatment. Nasal washings and aspirates are unacceptable for Xpert Xpress SARS-CoV-2/FLU/RSV testing.  Fact Sheet for Patients: EntrepreneurPulse.com.au  Fact Sheet for Healthcare Providers: IncredibleEmployment.be  This test is not yet approved or cleared by the [place] FDA and has been authorized for detection and/or diagnosis of SARS-CoV-2 by FDA under an Emergency Use Authorization (  EUA). This EUA will remain in effect (meaning this test can be used) for the duration of the COVID-19 declaration under Section 564(b)(1) of the Act, 21 U.S.C. section 360bbb-3(b)(1), unless the authorization is terminated or revoked.  Performed at [facility], [address] [place], [place] [address]      Labs: Basic Metabolic Panel: Recent Labs  Lab 12/13/21 2058 12/15/21 0502 12/16/21 0538 12/17/21 0842  NA 137 137 138 135  K 3.8 3.7 3.6 3.8  CL 104 107 107 107  CO2 23 26 25 23   GLUCOSE  103* 101* 102* 105*  BUN 14 7 7 8   CREATININE 0.96 0.86 0.96 1.04  CALCIUM 8.9 8.1* 8.1* 8.5*  MG  --  1.9 1.9  --    Liver Function Tests: Recent Labs  Lab 12/13/21 2058 12/16/21 0538  AST 27 20  ALT 27 21  ALKPHOS 67 58  BILITOT 0.6 0.6  PROT 7.6 5.9*  ALBUMIN 4.1 3.2*   Recent Labs  Lab 12/13/21 2058 12/16/21 0538  LIPASE 1,585* 42   No results for input(s): AMMONIA in the last 168 hours. CBC: Recent Labs  Lab 12/13/21 2058 12/16/21 0538  WBC 7.1 4.5  HGB 14.8 12.0*  HCT 42.8 35.4*  MCV 85.8 87.0  PLT 218 168   Cardiac Enzymes: No results for input(s): CKTOTAL, CKMB, CKMBINDEX, TROPONINI in the last 168 hours. BNP: BNP (last 3 results) No results for input(s): BNP in the last 8760 hours.  ProBNP (last 3 results) No results for input(s): PROBNP in the last 8760 hours.  CBG: No results for input(s): GLUCAP in the last 168 hours.     Signed:  Irine Seal MD.  [facility] 12/17/2021, 11:40 AM

## 2021-12-17 NOTE — Progress Notes (Signed)
Patient will be discharging this afternoon. Education on medications will be provided.

## 2021-12-23 ENCOUNTER — Other Ambulatory Visit: Payer: Self-pay | Admitting: Gastroenterology

## 2021-12-23 ENCOUNTER — Ambulatory Visit (HOSPITAL_COMMUNITY)
Admission: RE | Admit: 2021-12-23 | Discharge: 2021-12-23 | Disposition: A | Discharge status: Home / Self Care | Payer: Medicaid Other | Source: Ambulatory Visit | Attending: Gastroenterology | Admitting: Gastroenterology

## 2021-12-23 DIAGNOSIS — K869 Disease of pancreas, unspecified: Secondary | ICD-10-CM | POA: Insufficient documentation

## 2021-12-23 MED ORDER — GADOBUTROL 1 MMOL/ML IV SOLN
10.0000 mL | Freq: Once | INTRAVENOUS | Status: AC | PRN
  Administered 2021-12-23: 10 mL via INTRAVENOUS

## 2022-01-14 ENCOUNTER — Ambulatory Visit: Payer: Medicaid Other | Admitting: Gastroenterology

## 2022-06-02 ENCOUNTER — Emergency Department (HOSPITAL_COMMUNITY): Payer: Medicaid Other

## 2022-06-02 ENCOUNTER — Encounter (HOSPITAL_COMMUNITY): Payer: Self-pay

## 2022-06-02 ENCOUNTER — Other Ambulatory Visit: Payer: Self-pay

## 2022-06-02 ENCOUNTER — Inpatient Hospital Stay (HOSPITAL_COMMUNITY)
Admission: EM | Admit: 2022-06-02 | Discharge: 2022-06-04 | DRG: 440 | Disposition: A | Discharge status: Home / Self Care | Payer: Medicaid Other | Attending: Family Medicine | Admitting: Family Medicine

## 2022-06-02 DIAGNOSIS — Z91030 Bee allergy status: Secondary | ICD-10-CM

## 2022-06-02 DIAGNOSIS — K859 Acute pancreatitis without necrosis or infection, unspecified: Principal | ICD-10-CM | POA: Diagnosis present

## 2022-06-02 DIAGNOSIS — Z8249 Family history of ischemic heart disease and other diseases of the circulatory system: Secondary | ICD-10-CM

## 2022-06-02 DIAGNOSIS — Z87891 Personal history of nicotine dependence: Secondary | ICD-10-CM

## 2022-06-02 DIAGNOSIS — R001 Bradycardia, unspecified: Secondary | ICD-10-CM | POA: Diagnosis present

## 2022-06-02 DIAGNOSIS — Z720 Tobacco use: Secondary | ICD-10-CM | POA: Diagnosis present

## 2022-06-02 DIAGNOSIS — I10 Essential (primary) hypertension: Secondary | ICD-10-CM | POA: Diagnosis present

## 2022-06-02 DIAGNOSIS — R1013 Epigastric pain: Secondary | ICD-10-CM

## 2022-06-02 DIAGNOSIS — E785 Hyperlipidemia, unspecified: Secondary | ICD-10-CM | POA: Diagnosis present

## 2022-06-02 DIAGNOSIS — Z79899 Other long term (current) drug therapy: Secondary | ICD-10-CM | POA: Diagnosis not present

## 2022-06-02 DIAGNOSIS — R110 Nausea: Principal | ICD-10-CM

## 2022-06-02 LAB — URINALYSIS, ROUTINE W REFLEX MICROSCOPIC
Bilirubin Urine: NEGATIVE
Glucose, UA: NEGATIVE mg/dL
Hgb urine dipstick: NEGATIVE
Ketones, ur: 5 mg/dL — AB
Leukocytes,Ua: NEGATIVE
Nitrite: NEGATIVE
Protein, ur: NEGATIVE mg/dL
Specific Gravity, Urine: 1.023 (ref 1.005–1.030)
pH: 5 (ref 5.0–8.0)

## 2022-06-02 LAB — CBC WITH DIFFERENTIAL/PLATELET
Abs Immature Granulocytes: 0.02 10*3/uL (ref 0.00–0.07)
Basophils Absolute: 0 10*3/uL (ref 0.0–0.1)
Basophils Relative: 0 %
Eosinophils Absolute: 0.1 10*3/uL (ref 0.0–0.5)
Eosinophils Relative: 1 %
HCT: 43.8 % (ref 39.0–52.0)
Hemoglobin: 14.6 g/dL (ref 13.0–17.0)
Immature Granulocytes: 0 %
Lymphocytes Relative: 25 %
Lymphs Abs: 1.4 10*3/uL (ref 0.7–4.0)
MCH: 28.9 pg (ref 26.0–34.0)
MCHC: 33.3 g/dL (ref 30.0–36.0)
MCV: 86.6 fL (ref 80.0–100.0)
Monocytes Absolute: 0.6 10*3/uL (ref 0.1–1.0)
Monocytes Relative: 11 %
Neutro Abs: 3.5 10*3/uL (ref 1.7–7.7)
Neutrophils Relative %: 63 %
Platelets: 225 10*3/uL (ref 150–400)
RBC: 5.06 MIL/uL (ref 4.22–5.81)
RDW: 12.9 % (ref 11.5–15.5)
WBC: 5.6 10*3/uL (ref 4.0–10.5)
nRBC: 0 % (ref 0.0–0.2)

## 2022-06-02 LAB — LIPID PANEL
Cholesterol: 177 mg/dL (ref 0–200)
HDL: 32 mg/dL — ABNORMAL LOW (ref >40)
LDL Cholesterol: 129 mg/dL — ABNORMAL HIGH (ref 0–99)
Total CHOL/HDL Ratio: 5.5 RATIO
Triglycerides: 79 mg/dL (ref <150)
VLDL: 16 mg/dL (ref 0–40)

## 2022-06-02 LAB — COMPREHENSIVE METABOLIC PANEL
ALT: 23 U/L (ref 0–44)
AST: 23 U/L (ref 15–41)
Albumin: 4.1 g/dL (ref 3.5–5.0)
Alkaline Phosphatase: 79 U/L (ref 38–126)
Anion gap: 8 (ref 5–15)
BUN: 17 mg/dL (ref 6–20)
CO2: 22 mmol/L (ref 22–32)
Calcium: 9.2 mg/dL (ref 8.9–10.3)
Chloride: 110 mmol/L (ref 98–111)
Creatinine, Ser: 0.92 mg/dL (ref 0.61–1.24)
GFR, Estimated: >60 mL/min (ref >60)
Glucose, Bld: 121 mg/dL — ABNORMAL HIGH (ref 70–99)
Potassium: 3.7 mmol/L (ref 3.5–5.1)
Sodium: 140 mmol/L (ref 135–145)
Total Bilirubin: 0.8 mg/dL (ref 0.3–1.2)
Total Protein: 7.7 g/dL (ref 6.5–8.1)

## 2022-06-02 LAB — LIPASE, BLOOD: Lipase: 521 U/L — ABNORMAL HIGH (ref 11–51)

## 2022-06-02 MED ORDER — MORPHINE SULFATE (PF) 4 MG/ML IV SOLN
4.0000 mg | Freq: Once | INTRAVENOUS | Status: AC
  Administered 2022-06-02: 4 mg via INTRAVENOUS
  Filled 2022-06-02: qty 1

## 2022-06-02 MED ORDER — SODIUM CHLORIDE 0.9 % IV SOLN
INTRAVENOUS | Status: DC
Start: 2022-06-02 — End: 2022-06-04

## 2022-06-02 MED ORDER — HYDROMORPHONE HCL 1 MG/ML IJ SOLN
1.0000 mg | Freq: Once | INTRAMUSCULAR | Status: AC
  Administered 2022-06-02: 1 mg via INTRAVENOUS
  Filled 2022-06-02: qty 1

## 2022-06-02 MED ORDER — ACETAMINOPHEN 650 MG RE SUPP: 650.0000 mg | Freq: Four times a day (QID) | RECTAL | Status: DC | PRN

## 2022-06-02 MED ORDER — SODIUM CHLORIDE 0.9 % IV BOLUS
1000.0000 mL | Freq: Once | INTRAVENOUS | Status: AC
  Administered 2022-06-02: 1000 mL via INTRAVENOUS

## 2022-06-02 MED ORDER — PROCHLORPERAZINE EDISYLATE 10 MG/2ML IJ SOLN
10.0000 mg | Freq: Four times a day (QID) | INTRAMUSCULAR | Status: DC | PRN
  Administered 2022-06-02: 10 mg via INTRAVENOUS
  Filled 2022-06-02: qty 2

## 2022-06-02 MED ORDER — HYDROMORPHONE HCL 1 MG/ML IJ SOLN
1.0000 mg | INTRAMUSCULAR | Status: DC | PRN
  Administered 2022-06-02 – 2022-06-04 (×8): 1 mg via INTRAVENOUS
  Filled 2022-06-02 (×8): qty 1

## 2022-06-02 MED ORDER — ONDANSETRON HCL 4 MG/2ML IJ SOLN
4.0000 mg | Freq: Once | INTRAMUSCULAR | Status: AC
  Administered 2022-06-02: 4 mg via INTRAVENOUS
  Filled 2022-06-02: qty 2

## 2022-06-02 MED ORDER — NICOTINE 14 MG/24HR TD PT24
14.0000 mg | MEDICATED_PATCH | Freq: Every day | TRANSDERMAL | Status: DC
  Filled 2022-06-02: qty 1

## 2022-06-02 MED ORDER — HYDRALAZINE HCL 20 MG/ML IJ SOLN: 10.0000 mg | Freq: Three times a day (TID) | INTRAMUSCULAR | Status: DC | PRN

## 2022-06-02 MED ORDER — ACETAMINOPHEN 325 MG PO TABS: 650.0000 mg | ORAL_TABLET | Freq: Four times a day (QID) | ORAL | Status: DC | PRN

## 2022-06-02 MED ORDER — OXYCODONE HCL 5 MG PO TABS
5.0000 mg | ORAL_TABLET | ORAL | Status: DC | PRN
  Administered 2022-06-02 – 2022-06-03 (×6): 5 mg via ORAL
  Filled 2022-06-02 (×7): qty 1

## 2022-06-02 NOTE — H&P (Signed)
History and Physical    Patient: Max Ayers JSH:[id number] DOB: [birth year] DOA: 06/02/2022 DOS: the patient was seen and examined on 06/02/2022 PCP: Willeen Niece, PA  Patient coming from: Home  Chief Complaint:  Chief Complaint  Patient presents with   Abdominal Pain   Nausea   HPI: Max Ayers is a 51 y.o. male with medical history significant of EtOH pancreatitis, HTN, HLD. Presenting with abdominal pain. Symptoms started last night. He had sharp RUQ abdominal pain that radiated to his LUQ. He didn't have any fever or diarrhea. He did have nausea, but no vomiting. He denies any recent use of alcohol. His pain did not respond to OTC meds. When his symptoms did not improve last night, he decided to come to the ED for assistance. He denies any other aggravating or alleviating factors.   Review of Systems: As mentioned in the history of present illness. All other systems reviewed and are negative. Past Medical History:  Diagnosis Date   Hypertension    Pancreatitis    Past Surgical History:  Procedure Laterality Date   NO PAST SURGERIES     Social History:  reports that he has quit smoking. His smoking use included cigarettes and cigars. He smoked an average of 1 pack per day. He has never used smokeless tobacco. He reports current alcohol use. He reports that he does not use drugs.  Allergies  Allergen Reactions   Yellow Jacket Venom [Bee Venom] Anaphylaxis    Family History  Problem Relation Age of Onset   Diabetes Mother    Hypertension Father    Breast cancer Maternal Grandmother    Colon cancer Neg Hx    Stomach cancer Neg Hx    Liver disease Neg Hx    Pancreatic cancer Neg Hx    Esophageal cancer Neg Hx    Rectal cancer Neg Hx     Prior to Admission medications   Medication Sig Start Date End Date Taking? Authorizing Provider  acetaminophen (TYLENOL) 325 MG tablet Take 2 tablets (650 mg total) by mouth every 6 (six) hours as needed for mild pain (or Fever >/=  101). Patient not taking: Reported on 12/15/2021 09/20/21   Arrien, Jimmy Picket, MD  ibuprofen (ADVIL) 200 MG tablet Take 400-800 mg by mouth every 6 (six) hours as needed for mild pain.    [provider]  Naphazoline HCl (CLEAR EYES OP) Place 1 drop into both eyes 3 (three) times daily as needed (dry eyes).    [provider]  oxyCODONE (OXY IR/ROXICODONE) 5 MG immediate release tablet Take 1 tablet (5 mg total) by mouth every 4 (four) hours as needed for moderate pain. 12/17/21   Eugenie Filler, MD  pantoprazole (PROTONIX) 40 MG tablet Take 1 tablet (40 mg total) by mouth daily. 12/16/21   Eugenie Filler, MD  polyethylene glycol (MIRALAX / GLYCOLAX) 17 g packet Take 17 g by mouth daily as needed. 12/16/21   Eugenie Filler, MD    Physical Exam: Vitals:   06/02/22 0800 06/02/22 0830 06/02/22 0900 06/02/22 1000  BP: (!) 177/76 (!) 185/81 (!) 178/84 (!) 175/66  Pulse: 62 (!) 53 (!) 57 (!) 54  Resp: '19 19 10 12  '$ Temp:      SpO2: 100% 100% 100% 99%  Weight:      Height:       General: 51 y.o. male resting in bed in NAD Eyes: PERRL, normal sclera ENMT: Nares patent w/o discharge, orophaynx clear, dentition normal, ears  w/o discharge/lesions/ulcers Neck: Supple, trachea midline Cardiovascular: RRR, +S1, S2, no m/g/r, equal pulses throughout Respiratory: CTABL, no w/r/r, normal WOB GI: BS+, ND, TTP RUQ, no masses noted, no organomegaly noted MSK: No e/c/c Neuro: A&O x 3, no focal deficits Psyc: Appropriate interaction and affect, calm/cooperative  Data Reviewed:  Na+  140 K+  3.7 Glucose  121 BUN  17 Scr  0.92 Lipase  521 WBC  5.6 Hgb  14.6 Plt  225  Ab [place] 1. No sonographic evidence of acute cholecystitis. 2. Mildly dilated common bile duct. Correlate with liver function tests, if LFTs are abnormal recommend further evaluation with MRCP.  Assessment and Plan: Pancreatitis     - admit to inpt, tele     - fluids, NPO except ice chips/sips w meds      - [place] ab w/ mild dilation of CBD but LFTs are ok; let's trend LFTs for now, if they elevate, get MRCP and speak with GI     - denies recent EtOH use     - check lipid panel     - anti-emetics, pain control  Tobacco abuse     - counsel against further use     - nicotine patch available  HTN     - PRN hydralazine for now     - add BP regimen when tolerating PO  HLD     - doesn't look like he's on any home meds     - checking lipid panel  Asymptomatic sinus bradycardia     - EKG w/o block; he's asymptomatic, follow; avoid nodal blocking agents  Advance Care Planning:   Code Status: FULL  Consults: None  Family Communication: None at bedside  Severity of Illness: The appropriate patient status for this patient is INPATIENT. Inpatient status is judged to be reasonable and necessary in order to provide the required intensity of service to ensure the patient's safety. The patient's presenting symptoms, physical exam findings, and initial radiographic and laboratory data in the context of their chronic comorbidities is felt to place them at high risk for further clinical deterioration. Furthermore, it is not anticipated that the patient will be medically stable for discharge from the hospital within 2 midnights of admission.   * I certify that at the point of admission it is my clinical judgment that the patient will require inpatient hospital care spanning beyond 2 midnights from the point of admission due to high intensity of service, high risk for further deterioration and high frequency of surveillance required.*  Author: Jonnie Finner, DO 06/02/2022 10:35 AM  For on call review [email or web address].

## 2022-06-02 NOTE — ED Provider Notes (Signed)
[facility]-EMERGENCY DEPT Provider Note   CSN: [id number] Arrival date & time: 06/02/22  0553     History  Chief Complaint  Patient presents with   Abdominal Pain   Nausea    Max Ayers is a 51 y.o. male.  Patient presents to the hospital complaining of epigastric pain and nausea which began last night at approximately 7 PM.  Patient states that he has a history of pancreatitis and that this feels similar.  Patient denies any recent alcohol use and states that he has not been using alcohol since his last hospital admission back in January.  The patient denies vomiting at this time.  Denies chest pain, shortness of breath, urinary symptoms, diarrhea.  Patient has past medical history significant for high blood pressure, history of pancreatitis, history of alcohol abuse, history of tobacco use  HPI     Home Medications Prior to Admission medications   Medication Sig Start Date End Date Taking? Authorizing Provider  acetaminophen (TYLENOL) 325 MG tablet Take 2 tablets (650 mg total) by mouth every 6 (six) hours as needed for mild pain (or Fever >/= 101). Patient not taking: Reported on 12/15/2021 09/20/21   Arrien, York Ram, MD  ibuprofen (ADVIL) 200 MG tablet Take 400-800 mg by mouth every 6 (six) hours as needed for mild pain.    [provider]  Naphazoline HCl (CLEAR EYES OP) Place 1 drop into both eyes 3 (three) times daily as needed (dry eyes).    [provider]  oxyCODONE (OXY IR/ROXICODONE) 5 MG immediate release tablet Take 1 tablet (5 mg total) by mouth every 4 (four) hours as needed for moderate pain. 12/17/21   Rodolph Bong, MD  pantoprazole (PROTONIX) 40 MG tablet Take 1 tablet (40 mg total) by mouth daily. 12/16/21   Rodolph Bong, MD  polyethylene glycol (MIRALAX / GLYCOLAX) 17 g packet Take 17 g by mouth daily as needed. 12/16/21   Rodolph Bong, MD      Allergies    Yellow jacket venom [bee venom]    Review of  Systems   Review of Systems  Constitutional:  Negative for fever.  Respiratory:  Negative for shortness of breath.   Cardiovascular:  Negative for chest pain.  Gastrointestinal:  Positive for abdominal pain and nausea. Negative for diarrhea and vomiting.  Genitourinary:  Negative for dysuria.  Skin:  Negative for color change.    Physical Exam Updated Vital Signs BP (!) 178/84   Pulse (!) 57   Temp 97.9 F (36.6 C)   Resp 10   Ht 5\' 11"  (1.803 m)   Wt 90.7 kg   SpO2 100%   BMI 27.89 kg/m  Physical Exam Vitals and nursing note reviewed.  Constitutional:      Appearance: He is normal weight. He is ill-appearing.  HENT:     Head: Normocephalic and atraumatic.     Mouth/Throat:     Mouth: Mucous membranes are moist.  Eyes:     General: No scleral icterus.    Extraocular Movements: Extraocular movements intact.  Cardiovascular:     Rate and Rhythm: Normal rate and regular rhythm.     Heart sounds: Normal heart sounds.  Pulmonary:     Effort: Pulmonary effort is normal.     Breath sounds: Normal breath sounds.  Abdominal:     General: Abdomen is flat. There is no distension.     Palpations: Abdomen is soft.     Tenderness: There is  abdominal tenderness in the right upper quadrant and epigastric area.  Skin:    General: Skin is warm and dry.     Capillary Refill: Capillary refill takes less than 2 seconds.     Coloration: Skin is not jaundiced.  Neurological:     Mental Status: He is alert and oriented to person, place, and time.     ED Results / Procedures / Treatments   Labs (all labs ordered are listed, but only abnormal results are displayed) Labs Reviewed  COMPREHENSIVE METABOLIC PANEL - Abnormal; Notable for the following components:      Result Value   Glucose, Bld 121 (*)    All other components within normal limits  LIPASE, BLOOD - Abnormal; Notable for the following components:   Lipase 521 (*)    All other components within normal limits  CBC WITH  DIFFERENTIAL/PLATELET  URINALYSIS, ROUTINE W REFLEX MICROSCOPIC    EKG None  Radiology US Abdomen Limited RUQ (LIVER/GB)  Result Date: 06/02/2022 CLINICAL DATA:  Right upper quadrant pain for 2 days EXAM: ULTRASOUND ABDOMEN LIMITED RIGHT UPPER QUADRANT COMPARISON:  None Available. FINDINGS: Gallbladder: No gallstones or wall thickening visualized. No sonographic Murphy sign noted by sonographer. Common bile duct: Diameter: 7 mm Liver: No focal lesion identified. Within normal limits in parenchymal echogenicity. Portal vein is patent on color Doppler imaging with normal direction of blood flow towards the liver. Other: None. IMPRESSION: 1. No sonographic evidence of acute cholecystitis. 2. Mildly dilated common bile duct. Correlate with liver function tests, if LFTs are abnormal recommend further evaluation with MRCP. Electronically Signed   By: Allegra Lai M.D.   On: 06/02/2022 08:19    Procedures Procedures    Medications Ordered in ED Medications  morphine (PF) 4 MG/ML injection 4 mg (4 mg Intravenous Given 06/02/22 0718)  ondansetron (ZOFRAN) injection 4 mg (4 mg Intravenous Given 06/02/22 0717)  sodium chloride 0.9 % bolus 1,000 mL (1,000 mLs Intravenous New Bag/Given 06/02/22 0715)    ED Course/ Medical Decision Making/ A&P                           Medical Decision Making Amount and/or Complexity of Data Reviewed Labs: ordered. Radiology: ordered.  Risk Prescription drug management.   This patient presents to the ED for concern of abdominal pain, this involves an extensive number of treatment options, and is a complaint that carries with it a high risk of complications and morbidity.  The differential diagnosis includes pancreatitis, gallstone pancreatitis, cholecystitis, appendicitis, gastritis, and others   Co morbidities that complicate the patient evaluation  History of alcohol abuse and pancreatitis   Additional history obtained:  External records from  outside source obtained and reviewed including discharge summaries from hospitalization in January of this year for pancreatitis   Lab Tests:  I Ordered, and personally interpreted labs.  The pertinent results include: Lipase 521, glucose 121, grossly normal CBC   Imaging Studies ordered:  I ordered imaging studies including ultrasound right upper quadrant I independently visualized and interpreted imaging which showed no sonographic evidence of acute cholecystitis.  Mildly dilated common bile duct. I agree with the radiologist interpretation   Cardiac Monitoring: / EKG:  The patient was maintained on a cardiac monitor.  I personally viewed and interpreted the cardiac monitored which showed an underlying rhythm of: Sinus arrhythmia   Consultations Obtained:  I requested consultation with the hospitalist, Dr.Kyle  and discussed lab and imaging findings as  well as pertinent plan - they recommend: Agrees to see patient for admission   Problem List / ED Course / Critical interventions / Medication management   I ordered medication including morphine for pain, zofran for nausea, normal saline for fluids  Reevaluation of the patient after these medicines showed that the patient improved I have reviewed the patients home medicines and have made adjustments as needed   Test / Admission - Considered:  The patient will require hospital admission for IV fluids and pain control.  No sign of gallstone pancreatitis based on right upper quadrant ultrasound.  Admit to hospital.        Final Clinical Impression(s) / ED Diagnoses Final diagnoses:  Nausea  Epigastric pain  Acute pancreatitis, unspecified complication status, unspecified pancreatitis type    Rx / DC Orders ED Discharge Orders     None         Pamala Duffel 06/02/22 0916    Sloan Leiter, DO 06/03/22 0745

## 2022-06-02 NOTE — ED Notes (Signed)
Unsuccessful IV attempt in R hand.

## 2022-06-02 NOTE — ED Triage Notes (Signed)
Pt reports with upper abdominal pain and nausea since last night. Pt reports having a hx of pancreatitis.

## 2022-06-03 DIAGNOSIS — K859 Acute pancreatitis without necrosis or infection, unspecified: Secondary | ICD-10-CM | POA: Diagnosis not present

## 2022-06-03 LAB — CBC
HCT: 41.3 % (ref 39.0–52.0)
Hemoglobin: 13.5 g/dL (ref 13.0–17.0)
MCH: 29.1 pg (ref 26.0–34.0)
MCHC: 32.7 g/dL (ref 30.0–36.0)
MCV: 89 fL (ref 80.0–100.0)
Platelets: 181 10*3/uL (ref 150–400)
RBC: 4.64 MIL/uL (ref 4.22–5.81)
RDW: 13 % (ref 11.5–15.5)
WBC: 5.2 10*3/uL (ref 4.0–10.5)
nRBC: 0 % (ref 0.0–0.2)

## 2022-06-03 LAB — COMPREHENSIVE METABOLIC PANEL
ALT: 19 U/L (ref 0–44)
AST: 19 U/L (ref 15–41)
Albumin: 3.4 g/dL — ABNORMAL LOW (ref 3.5–5.0)
Alkaline Phosphatase: 68 U/L (ref 38–126)
Anion gap: 7 (ref 5–15)
BUN: 9 mg/dL (ref 6–20)
CO2: 24 mmol/L (ref 22–32)
Calcium: 8.6 mg/dL — ABNORMAL LOW (ref 8.9–10.3)
Chloride: 106 mmol/L (ref 98–111)
Creatinine, Ser: 0.83 mg/dL (ref 0.61–1.24)
GFR, Estimated: >60 mL/min (ref >60)
Glucose, Bld: 96 mg/dL (ref 70–99)
Potassium: 3.4 mmol/L — ABNORMAL LOW (ref 3.5–5.1)
Sodium: 137 mmol/L (ref 135–145)
Total Bilirubin: 0.9 mg/dL (ref 0.3–1.2)
Total Protein: 6.6 g/dL (ref 6.5–8.1)

## 2022-06-03 MED ORDER — POTASSIUM CHLORIDE CRYS ER 20 MEQ PO TBCR
40.0000 meq | EXTENDED_RELEASE_TABLET | Freq: Once | ORAL | Status: AC
Start: 2022-06-03 — End: 2022-06-03
  Administered 2022-06-03: 40 meq via ORAL
  Filled 2022-06-03: qty 2

## 2022-06-04 DIAGNOSIS — K859 Acute pancreatitis without necrosis or infection, unspecified: Secondary | ICD-10-CM | POA: Diagnosis not present

## 2022-06-04 LAB — BASIC METABOLIC PANEL
Anion gap: 5 (ref 5–15)
BUN: 7 mg/dL (ref 6–20)
CO2: 24 mmol/L (ref 22–32)
Calcium: 8.6 mg/dL — ABNORMAL LOW (ref 8.9–10.3)
Chloride: 108 mmol/L (ref 98–111)
Creatinine, Ser: 0.85 mg/dL (ref 0.61–1.24)
GFR, Estimated: >60 mL/min (ref >60)
Glucose, Bld: 131 mg/dL — ABNORMAL HIGH (ref 70–99)
Potassium: 3.7 mmol/L (ref 3.5–5.1)
Sodium: 137 mmol/L (ref 135–145)

## 2022-06-04 MED ORDER — AMLODIPINE BESYLATE 5 MG PO TABS: 5.0000 mg | ORAL_TABLET | Freq: Every day | ORAL | 0 refills | Status: DC

## 2022-06-04 MED ORDER — AMLODIPINE BESYLATE 5 MG PO TABS
5.0000 mg | ORAL_TABLET | Freq: Every day | ORAL | Status: DC
  Administered 2022-06-04: 5 mg via ORAL
  Filled 2022-06-04: qty 1

## 2022-06-04 NOTE — Plan of Care (Signed)

## 2022-06-30 ENCOUNTER — Telehealth: Payer: Self-pay

## 2022-06-30 DIAGNOSIS — K869 Disease of pancreas, unspecified: Secondary | ICD-10-CM

## 2022-06-30 NOTE — Telephone Encounter (Signed)
-----   Message from Timothy Lasso, RN sent at 12/31/2021 10:25 AM EST ----- recommend that we reevaluate this with an MRI/MRCP in 6 months. He should be seen in clinic after the MRI/MRCP is completed with PA Esterwood or myself. If he has any other issues he should be seen before then. As well, please forward this MRI/MRCP to the patient's PCP who had ordered the previous ultrasound.  Thanks.

## 2022-06-30 NOTE — Telephone Encounter (Signed)
MRCP order entered  The pt aware and agrees to the testing. Order sent to the schedulers

## 2022-07-03 ENCOUNTER — Inpatient Hospital Stay (HOSPITAL_BASED_OUTPATIENT_CLINIC_OR_DEPARTMENT_OTHER)
Admission: EM | Admit: 2022-07-03 | Discharge: 2022-07-08 | DRG: 439 | Disposition: A | Discharge status: Home / Self Care | Payer: Medicaid Other | Attending: Student | Admitting: Student

## 2022-07-03 ENCOUNTER — Encounter (HOSPITAL_BASED_OUTPATIENT_CLINIC_OR_DEPARTMENT_OTHER): Payer: Self-pay | Admitting: Emergency Medicine

## 2022-07-03 DIAGNOSIS — E876 Hypokalemia: Secondary | ICD-10-CM | POA: Diagnosis present

## 2022-07-03 DIAGNOSIS — K860 Alcohol-induced chronic pancreatitis: Secondary | ICD-10-CM | POA: Diagnosis not present

## 2022-07-03 DIAGNOSIS — F1010 Alcohol abuse, uncomplicated: Secondary | ICD-10-CM | POA: Diagnosis present

## 2022-07-03 DIAGNOSIS — E663 Overweight: Secondary | ICD-10-CM | POA: Diagnosis not present

## 2022-07-03 DIAGNOSIS — K852 Alcohol induced acute pancreatitis without necrosis or infection: Principal | ICD-10-CM | POA: Diagnosis present

## 2022-07-03 DIAGNOSIS — K862 Cyst of pancreas: Secondary | ICD-10-CM | POA: Diagnosis not present

## 2022-07-03 DIAGNOSIS — Z803 Family history of malignant neoplasm of breast: Secondary | ICD-10-CM

## 2022-07-03 DIAGNOSIS — K859 Acute pancreatitis without necrosis or infection, unspecified: Secondary | ICD-10-CM | POA: Diagnosis present

## 2022-07-03 DIAGNOSIS — Z6829 Body mass index (BMI) 29.0-29.9, adult: Secondary | ICD-10-CM

## 2022-07-03 DIAGNOSIS — Z833 Family history of diabetes mellitus: Secondary | ICD-10-CM

## 2022-07-03 DIAGNOSIS — I10 Essential (primary) hypertension: Secondary | ICD-10-CM | POA: Diagnosis not present

## 2022-07-03 DIAGNOSIS — Z8249 Family history of ischemic heart disease and other diseases of the circulatory system: Secondary | ICD-10-CM

## 2022-07-03 DIAGNOSIS — Z87891 Personal history of nicotine dependence: Secondary | ICD-10-CM

## 2022-07-03 DIAGNOSIS — F1011 Alcohol abuse, in remission: Secondary | ICD-10-CM | POA: Diagnosis present

## 2022-07-03 DIAGNOSIS — R509 Fever, unspecified: Secondary | ICD-10-CM

## 2022-07-03 LAB — COMPREHENSIVE METABOLIC PANEL
ALT: 24 U/L (ref 0–44)
AST: 27 U/L (ref 15–41)
Albumin: 3.8 g/dL (ref 3.5–5.0)
Alkaline Phosphatase: 81 U/L (ref 38–126)
Anion gap: 6 (ref 5–15)
BUN: 13 mg/dL (ref 6–20)
CO2: 26 mmol/L (ref 22–32)
Calcium: 9 mg/dL (ref 8.9–10.3)
Chloride: 106 mmol/L (ref 98–111)
Creatinine, Ser: 0.94 mg/dL (ref 0.61–1.24)
GFR, Estimated: >60 mL/min (ref >60)
Glucose, Bld: 95 mg/dL (ref 70–99)
Potassium: 4.1 mmol/L (ref 3.5–5.1)
Sodium: 138 mmol/L (ref 135–145)
Total Bilirubin: 0.8 mg/dL (ref 0.3–1.2)
Total Protein: 7.4 g/dL (ref 6.5–8.1)

## 2022-07-03 LAB — CBC WITH DIFFERENTIAL/PLATELET
Abs Immature Granulocytes: 0.01 10*3/uL (ref 0.00–0.07)
Basophils Absolute: 0 10*3/uL (ref 0.0–0.1)
Basophils Relative: 0 %
Eosinophils Absolute: 0.1 10*3/uL (ref 0.0–0.5)
Eosinophils Relative: 2 %
HCT: 44.6 % (ref 39.0–52.0)
Hemoglobin: 15.1 g/dL (ref 13.0–17.0)
Immature Granulocytes: 0 %
Lymphocytes Relative: 40 %
Lymphs Abs: 1.9 10*3/uL (ref 0.7–4.0)
MCH: 29 pg (ref 26.0–34.0)
MCHC: 33.9 g/dL (ref 30.0–36.0)
MCV: 85.8 fL (ref 80.0–100.0)
Monocytes Absolute: 0.4 10*3/uL (ref 0.1–1.0)
Monocytes Relative: 9 %
Neutro Abs: 2.4 10*3/uL (ref 1.7–7.7)
Neutrophils Relative %: 49 %
Platelets: 201 10*3/uL (ref 150–400)
RBC: 5.2 MIL/uL (ref 4.22–5.81)
RDW: 12.8 % (ref 11.5–15.5)
WBC: 4.8 10*3/uL (ref 4.0–10.5)
nRBC: 0 % (ref 0.0–0.2)

## 2022-07-03 LAB — LIPASE, BLOOD: Lipase: 1672 U/L — ABNORMAL HIGH (ref 11–51)

## 2022-07-03 MED ORDER — SODIUM CHLORIDE 0.9 % IV BOLUS
1000.0000 mL | Freq: Once | INTRAVENOUS | Status: AC
  Administered 2022-07-03: 1000 mL via INTRAVENOUS

## 2022-07-03 MED ORDER — METOCLOPRAMIDE HCL 5 MG/ML IJ SOLN: 5.0000 mg | Freq: Once | INTRAMUSCULAR | Status: DC

## 2022-07-03 MED ORDER — HYDROMORPHONE HCL 1 MG/ML IJ SOLN
1.0000 mg | INTRAMUSCULAR | Status: DC | PRN
  Administered 2022-07-03: 1 mg via INTRAVENOUS
  Filled 2022-07-03: qty 1

## 2022-07-03 MED ORDER — HYDROMORPHONE HCL 1 MG/ML IJ SOLN
1.0000 mg | Freq: Once | INTRAMUSCULAR | Status: AC
  Administered 2022-07-03: 1 mg via INTRAVENOUS
  Filled 2022-07-03: qty 1

## 2022-07-03 MED ORDER — ONDANSETRON HCL 4 MG/2ML IJ SOLN
4.0000 mg | Freq: Four times a day (QID) | INTRAMUSCULAR | Status: DC | PRN
  Administered 2022-07-03 – 2022-07-04 (×3): 4 mg via INTRAVENOUS
  Filled 2022-07-03 (×3): qty 2

## 2022-07-03 MED ORDER — LACTATED RINGERS IV SOLN: INTRAVENOUS | Status: DC

## 2022-07-03 MED ORDER — MORPHINE SULFATE (PF) 2 MG/ML IV SOLN
2.0000 mg | INTRAVENOUS | Status: DC | PRN
  Administered 2022-07-03: 4 mg via INTRAVENOUS
  Administered 2022-07-04 (×4): 2 mg via INTRAVENOUS
  Administered 2022-07-04 – 2022-07-05 (×2): 4 mg via INTRAVENOUS
  Administered 2022-07-05 (×2): 2 mg via INTRAVENOUS
  Filled 2022-07-03: qty 2
  Filled 2022-07-03 (×5): qty 1
  Filled 2022-07-03: qty 2
  Filled 2022-07-03 (×3): qty 1

## 2022-07-03 MED ORDER — METOCLOPRAMIDE HCL 5 MG/ML IJ SOLN
5.0000 mg | Freq: Four times a day (QID) | INTRAMUSCULAR | Status: DC | PRN
  Administered 2022-07-03: 5 mg via INTRAVENOUS
  Filled 2022-07-03: qty 2

## 2022-07-03 MED ORDER — ACETAMINOPHEN 650 MG RE SUPP: 650.0000 mg | Freq: Four times a day (QID) | RECTAL | Status: DC | PRN

## 2022-07-03 MED ORDER — ACETAMINOPHEN 325 MG PO TABS
650.0000 mg | ORAL_TABLET | Freq: Four times a day (QID) | ORAL | Status: DC | PRN
  Administered 2022-07-05 – 2022-07-08 (×10): 650 mg via ORAL
  Filled 2022-07-03 (×10): qty 2

## 2022-07-03 MED ORDER — ONDANSETRON HCL 4 MG/2ML IJ SOLN
4.0000 mg | Freq: Once | INTRAMUSCULAR | Status: DC
  Filled 2022-07-03: qty 2

## 2022-07-03 MED ORDER — MORPHINE SULFATE (PF) 4 MG/ML IV SOLN
4.0000 mg | Freq: Once | INTRAVENOUS | Status: AC
  Administered 2022-07-03: 4 mg via INTRAVENOUS
  Filled 2022-07-03: qty 1

## 2022-07-03 MED ORDER — ONDANSETRON HCL 4 MG/2ML IJ SOLN
4.0000 mg | Freq: Once | INTRAMUSCULAR | Status: AC
  Administered 2022-07-03: 4 mg via INTRAVENOUS
  Filled 2022-07-03: qty 2

## 2022-07-03 MED ORDER — ONDANSETRON HCL 4 MG/2ML IJ SOLN
4.0000 mg | Freq: Once | INTRAMUSCULAR | Status: AC
  Administered 2022-07-03: 4 mg via INTRAVENOUS

## 2022-07-03 MED ORDER — ENOXAPARIN SODIUM 40 MG/0.4ML IJ SOSY
40.0000 mg | PREFILLED_SYRINGE | INTRAMUSCULAR | Status: DC
  Filled 2022-07-03: qty 0.4

## 2022-07-03 MED ORDER — METOCLOPRAMIDE HCL 5 MG/ML IJ SOLN: 5.0000 mg | Freq: Four times a day (QID) | INTRAMUSCULAR | Status: DC | PRN

## 2022-07-03 MED ORDER — ONDANSETRON HCL 4 MG PO TABS: 4.0000 mg | ORAL_TABLET | Freq: Four times a day (QID) | ORAL | Status: DC | PRN

## 2022-07-03 NOTE — ED Triage Notes (Signed)
Pt reports general upper abd pain since last pm; vomited x 1

## 2022-07-03 NOTE — ED Notes (Signed)
[facility] called, ETA 10 min, report given.  Pt stable for transport

## 2022-07-03 NOTE — Assessment & Plan Note (Signed)
Likely acute on chronic pancreatitis today. H/o EtOH pancreatitis, no EtOH since Jan per patient. Does have known 1.5cm lesion (see below). 1. NPO 2. IVF 3. Morphine PRN pain 4. zofran PRN nausea 5. Repeat labs in AM.

## 2022-07-03 NOTE — Progress Notes (Signed)
Patient arrived via [facility]. Pt is alert and oriented x4.  Independent ambulation.  Vomited  400 cc of emesis.  Stated Dilaudid causes N/V.  Informed MD.  Bradycardia at around 40's and 50's.  Nofified MD that Patient stated Morphine does not cause nausea for him.

## 2022-07-03 NOTE — ED Notes (Signed)
Pt complaining of ongoing pain and increased nausea. PA made aware.

## 2022-07-03 NOTE — ED Notes (Signed)
Report given to Roland Rack RN, pt stable for transfer by [facility]

## 2022-07-03 NOTE — H&P (Signed)
History and Physical    Patient: Max Ayers ZOX:[id number] DOB: [birth year] DOA: 07/03/2022 DOS: the patient was seen and examined on 07/03/2022 PCP: Willeen Niece, PA  Patient coming from: Home  Chief Complaint:  Chief Complaint  Patient presents with   Abdominal Pain   HPI: Max Ayers is a 51 y.o. male with medical history significant of pancreatitis, HTN, prior EtOH use / abuse.  Pt with h/o EtOH pancreatitis recurrently in past.  Quit drinking EtOH, no drink since Jan.  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  Pt presents to ED with c/o epigastric pain that onset yesterday.  Pain feels similar to prior pancreatitis flareups.  No recent EtOH use.  1 episode NBNB emesis.  Persistent nausea.  Last BM yesterday, was normal.  Did eat a bunch of junk food yesterday.   Review of Systems: As mentioned in the history of present illness. All other systems reviewed and are negative. Past Medical History:  Diagnosis Date   Hypertension    Pancreatitis    Past Surgical History:  Procedure Laterality Date   NO PAST SURGERIES     Social History:  reports that he has quit smoking. His smoking use included cigarettes and cigars. He smoked an average of 1 pack per day. He has never used smokeless tobacco. He reports that he does not currently use alcohol. He reports that he does not use drugs.  Allergies  Allergen Reactions   Yellow Jacket Venom [Bee Venom] Anaphylaxis    Family History  Problem Relation Age of Onset   Diabetes Mother    Hypertension Father    Breast cancer Maternal Grandmother    Colon cancer Neg Hx    Stomach cancer Neg Hx    Liver disease Neg Hx    Pancreatic cancer Neg Hx    Esophageal cancer Neg Hx    Rectal cancer Neg Hx     Prior to Admission medications   Medication Sig Start Date End Date Taking? Authorizing Provider  amLODipine (NORVASC) 5 MG tablet Take 1 tablet (5 mg total) by mouth daily. 06/05/22 07/05/22   Darliss Cheney, MD  ibuprofen (ADVIL) 200 MG tablet Take 400-800 mg by mouth every 4 (four) hours as needed for headache (pain).    [provider]  Naphazoline HCl (CLEAR EYES OP) Place 1 drop into both eyes 3 (three) times daily as needed (dry eyes).    [provider]    Physical Exam: Vitals:   07/03/22 1630 07/03/22 1800 07/03/22 1837 07/03/22 2145  BP: 138/83 (!) 153/59  130/72  Pulse: (!) 53 (!) 49  (!) 54  Resp: '15 12  16  '$ Temp:   98.1 F (36.7 C) 97.7 F (36.5 C)  TempSrc:    Oral  SpO2: 95% 94%  97%  Weight:      Height:       Constitutional: NAD, calm, comfortable Eyes: PERRL, lids and conjunctivae normal ENMT: Mucous membranes are moist. Posterior pharynx clear of any exudate or lesions.Normal dentition.  Neck: normal, supple, no masses, no thyromegaly Respiratory: clear to auscultation bilaterally, no wheezing, no crackles. Normal respiratory effort. No accessory muscle use.  Cardiovascular: Regular rate and rhythm, no murmurs / rubs / gallops. No extremity edema. 2+ pedal pulses. No carotid bruits.  Abdomen: Epigastric TTP, no rebound Musculoskeletal: no clubbing / cyanosis. No joint deformity upper and lower extremities. Good ROM, no contractures. Normal muscle tone.  Skin: no rashes, lesions, ulcers. No induration Neurologic:  CN 2-12 grossly intact. Sensation intact, DTR normal. Strength 5/5 in all 4.  Psychiatric: Normal judgment and insight. Alert and oriented x 3. Normal mood.   Data Reviewed:    Lipase 1672.  CBC and CMP otherwise unremarkable.  Assessment and Plan: * Acute alcoholic pancreatitis Likely acute on chronic pancreatitis today. H/o EtOH pancreatitis, no EtOH since Jan per patient. Does have known 1.5cm lesion (see below). NPO IVF Morphine PRN pain zofran PRN nausea Repeat labs in AM.  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  Has been waxing and waning in size  since 2021. Will get repeat MRCP  Alcohol abuse Pt states no EtOH since Jan.      Advance Care Planning:   Code Status: Full Code  Consults: None  Family Communication: No family in room  Severity of Illness: The appropriate patient status for this patient is OBSERVATION. Observation status is judged to be reasonable and necessary in order to provide the required intensity of service to ensure the patient's safety. The patient's presenting symptoms, physical exam findings, and initial radiographic and laboratory data in the context of their medical condition is felt to place them at decreased risk for further clinical deterioration. Furthermore, it is anticipated that the patient will be medically stable for discharge from the hospital within 2 midnights of admission.   Author: Etta Quill., DO 07/03/2022 10:49 PM  For on call review [email or web address].

## 2022-07-03 NOTE — Assessment & Plan Note (Signed)
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  Has been waxing and waning in size since 2021. 1. Will get repeat MRCP

## 2022-07-03 NOTE — ED Provider Notes (Signed)
[facility] EMERGENCY DEPARTMENT Provider Note   CSN: [id number] Arrival date & time: 07/03/22  1408     History  Chief Complaint  Patient presents with   Abdominal Pain    Max Ayers is a 51 y.o. male.  HPI 51 year old male with a history of hypertension, alcohol induced pancreatitis, constipation presents to the ER with complaints of epigastric pain which began yesterday.  He reports that the pain feels similar to his prior pancreatitis flareups.  He denies any recent alcohol use, states he has not had any alcohol since January.  He has had 1 episode of nonbloody, nonbilious emesis and has had persistent nausea.  He did try to drink some broth which she felt like exacerbated his symptoms.  He denies any fevers.  Denies any urinary symptoms.  Last bowel movement was yesterday and normal.  He admits that yesterday he ate a lot of junk food including pizza and candy.    Home Medications Prior to Admission medications   Medication Sig Start Date End Date Taking? Authorizing Provider  amLODipine (NORVASC) 5 MG tablet Take 1 tablet (5 mg total) by mouth daily. 06/05/22 07/05/22  Darliss Cheney, MD  ibuprofen (ADVIL) 200 MG tablet Take 400-800 mg by mouth every 4 (four) hours as needed for headache (pain).    [provider]  Naphazoline HCl (CLEAR EYES OP) Place 1 drop into both eyes 3 (three) times daily as needed (dry eyes).    [provider]      Allergies    Yellow jacket venom [bee venom]    Review of Systems   Review of Systems Ten systems reviewed and are negative for acute change, except as noted in the HPI.   Physical Exam Updated Vital Signs BP (!) 153/59   Pulse (!) 49   Temp 97.9 F (36.6 C) (Oral)   Resp 12   Ht '5\' 11"'$  (1.803 m)   Wt 96.2 kg   SpO2 94%   BMI 29.57 kg/m  Physical Exam Vitals and nursing note reviewed.  Constitutional:      General: He is not in acute distress.    Appearance: He is well-developed.  HENT:      Head: Normocephalic and atraumatic.  Eyes:     Conjunctiva/sclera: Conjunctivae normal.  Cardiovascular:     Rate and Rhythm: Normal rate and regular rhythm.     Heart sounds: No murmur heard. Pulmonary:     Effort: Pulmonary effort is normal. No respiratory distress.     Breath sounds: Normal breath sounds.  Abdominal:     Palpations: Abdomen is soft.     Tenderness: There is abdominal tenderness.     Comments: Abdomen is distended but soft, with epigastric tenderness.  Negative Murphy sign.  Musculoskeletal:        General: No swelling.     Cervical back: Neck supple.  Skin:    General: Skin is warm and dry.     Capillary Refill: Capillary refill takes less than 2 seconds.  Neurological:     Mental Status: He is alert.  Psychiatric:        Mood and Affect: Mood normal.     ED Results / Procedures / Treatments   Labs (all labs ordered are listed, but only abnormal results are displayed) Labs Reviewed  LIPASE, BLOOD - Abnormal; Notable for the following components:      Result Value   Lipase 1,672 (*)    All other components within normal limits  CBC WITH DIFFERENTIAL/PLATELET  COMPREHENSIVE METABOLIC PANEL  URINALYSIS, ROUTINE W REFLEX MICROSCOPIC    EKG None  Radiology No results found.  Procedures Procedures    Medications Ordered in ED Medications  HYDROmorphone (DILAUDID) injection 1 mg (1 mg Intravenous Given 07/03/22 1455)  ondansetron (ZOFRAN) injection 4 mg (4 mg Intravenous Given 07/03/22 1454)  sodium chloride 0.9 % bolus 1,000 mL (1,000 mLs Intravenous New Bag/Given 07/03/22 1455)  HYDROmorphone (DILAUDID) injection 1 mg (1 mg Intravenous Given 07/03/22 1645)  morphine (PF) 4 MG/ML injection 4 mg (4 mg Intravenous Given 07/03/22 1746)  ondansetron (ZOFRAN) injection 4 mg (4 mg Intravenous Given 07/03/22 1746)    ED Course/ Medical Decision Making/ A&P                           Medical Decision Making Amount and/or Complexity of Data Reviewed Labs:  ordered.  Risk Prescription drug management.   51 year old male presenting with epigastric pain x1 day.  Vitals overall reassuring.  He has epigastric tenderness on exam and some abdominal distention but abdomen is soft.  Differential diagnosis includes gastritis, pancreatitis, cholecystitis, constipation/SBO, less likely ACS  Labs ordered, reviewed and interpreted by me. CBC and CMP unremarkable, however lipase of 1672.  EKG ordered reviewed and interpreted, nonischemic, normal sinus rhythm.  Patient was given IV fluids and 2 rounds of Dilaudid and morphine with minimal pain relief. Given Zofran for nausea.  Per chart review, he has required admission for pain control every bout of pancreatitis that he has had.  I have low suspicion for necrotizing pancreatitis given patient has no fever, leukocytosis.  Will consult hospitalist for admission.  Spoke w/ Dr. Posey Pronto who will admit the patient for further evaluation and treatment.   Final Clinical Impression(s) / ED Diagnoses Final diagnoses:  Acute pancreatitis, unspecified complication status, unspecified pancreatitis type    Rx / DC Orders ED Discharge Orders     None         Lyndel Safe 07/03/22 1823    Lucrezia Starch, MD 07/04/22 2157

## 2022-07-03 NOTE — Assessment & Plan Note (Signed)
Pt states no EtOH since Jan.

## 2022-07-04 ENCOUNTER — Other Ambulatory Visit: Payer: Self-pay

## 2022-07-04 ENCOUNTER — Observation Stay (HOSPITAL_COMMUNITY): Payer: Medicaid Other

## 2022-07-04 DIAGNOSIS — K860 Alcohol-induced chronic pancreatitis: Secondary | ICD-10-CM | POA: Diagnosis present

## 2022-07-04 DIAGNOSIS — F1010 Alcohol abuse, uncomplicated: Secondary | ICD-10-CM | POA: Diagnosis present

## 2022-07-04 DIAGNOSIS — Z833 Family history of diabetes mellitus: Secondary | ICD-10-CM | POA: Diagnosis not present

## 2022-07-04 DIAGNOSIS — E663 Overweight: Secondary | ICD-10-CM | POA: Diagnosis present

## 2022-07-04 DIAGNOSIS — F1011 Alcohol abuse, in remission: Secondary | ICD-10-CM | POA: Diagnosis not present

## 2022-07-04 DIAGNOSIS — I10 Essential (primary) hypertension: Secondary | ICD-10-CM | POA: Diagnosis present

## 2022-07-04 DIAGNOSIS — E876 Hypokalemia: Secondary | ICD-10-CM | POA: Diagnosis present

## 2022-07-04 DIAGNOSIS — Z6829 Body mass index (BMI) 29.0-29.9, adult: Secondary | ICD-10-CM | POA: Diagnosis not present

## 2022-07-04 DIAGNOSIS — Z803 Family history of malignant neoplasm of breast: Secondary | ICD-10-CM | POA: Diagnosis not present

## 2022-07-04 DIAGNOSIS — Z8249 Family history of ischemic heart disease and other diseases of the circulatory system: Secondary | ICD-10-CM | POA: Diagnosis not present

## 2022-07-04 DIAGNOSIS — Z87891 Personal history of nicotine dependence: Secondary | ICD-10-CM | POA: Diagnosis not present

## 2022-07-04 DIAGNOSIS — K862 Cyst of pancreas: Secondary | ICD-10-CM | POA: Diagnosis present

## 2022-07-04 DIAGNOSIS — R509 Fever, unspecified: Secondary | ICD-10-CM | POA: Diagnosis not present

## 2022-07-04 DIAGNOSIS — K852 Alcohol induced acute pancreatitis without necrosis or infection: Secondary | ICD-10-CM | POA: Diagnosis present

## 2022-07-04 LAB — URINALYSIS, ROUTINE W REFLEX MICROSCOPIC
Bilirubin Urine: NEGATIVE
Glucose, UA: NEGATIVE mg/dL
Hgb urine dipstick: NEGATIVE
Ketones, ur: NEGATIVE mg/dL
Leukocytes,Ua: NEGATIVE
Nitrite: NEGATIVE
Protein, ur: NEGATIVE mg/dL
Specific Gravity, Urine: 1.026 (ref 1.005–1.030)
pH: 6 (ref 5.0–8.0)

## 2022-07-04 LAB — COMPREHENSIVE METABOLIC PANEL
ALT: 20 U/L (ref 0–44)
AST: 21 U/L (ref 15–41)
Albumin: 3.5 g/dL (ref 3.5–5.0)
Alkaline Phosphatase: 72 U/L (ref 38–126)
Anion gap: 8 (ref 5–15)
BUN: 12 mg/dL (ref 6–20)
CO2: 25 mmol/L (ref 22–32)
Calcium: 8.9 mg/dL (ref 8.9–10.3)
Chloride: 106 mmol/L (ref 98–111)
Creatinine, Ser: 0.88 mg/dL (ref 0.61–1.24)
GFR, Estimated: >60 mL/min (ref >60)
Glucose, Bld: 106 mg/dL — ABNORMAL HIGH (ref 70–99)
Potassium: 4.2 mmol/L (ref 3.5–5.1)
Sodium: 139 mmol/L (ref 135–145)
Total Bilirubin: 0.4 mg/dL (ref 0.3–1.2)
Total Protein: 7 g/dL (ref 6.5–8.1)

## 2022-07-04 LAB — CBC
HCT: 42.6 % (ref 39.0–52.0)
Hemoglobin: 14.1 g/dL (ref 13.0–17.0)
MCH: 29.1 pg (ref 26.0–34.0)
MCHC: 33.1 g/dL (ref 30.0–36.0)
MCV: 87.8 fL (ref 80.0–100.0)
Platelets: 190 10*3/uL (ref 150–400)
RBC: 4.85 MIL/uL (ref 4.22–5.81)
RDW: 13 % (ref 11.5–15.5)
WBC: 5.9 10*3/uL (ref 4.0–10.5)
nRBC: 0 % (ref 0.0–0.2)

## 2022-07-04 LAB — LIPASE, BLOOD: Lipase: 385 U/L — ABNORMAL HIGH (ref 11–51)

## 2022-07-04 MED ORDER — AMLODIPINE BESYLATE 5 MG PO TABS
5.0000 mg | ORAL_TABLET | Freq: Every day | ORAL | Status: DC
Start: 2022-07-04 — End: 2022-07-08
  Administered 2022-07-04 – 2022-07-08 (×5): 5 mg via ORAL
  Filled 2022-07-04 (×5): qty 1

## 2022-07-04 MED ORDER — GADOBUTROL 1 MMOL/ML IV SOLN
10.0000 mL | Freq: Once | INTRAVENOUS | Status: AC | PRN
  Administered 2022-07-04: 10 mL via INTRAVENOUS

## 2022-07-04 NOTE — Progress Notes (Signed)
PROGRESS NOTE  Max Ayers KJZ:[id number] DOB: 11/23/1971   PCP: Willeen Niece, PA  Patient is from: Home.  DOA: 07/03/2022 LOS: 0  Chief complaints Chief Complaint  Patient presents with   Abdominal Pain     Brief Narrative / Interim history: 51 year old M with history of alcoholic pancreatitis, complex pancreatic cyst, prior EtOH abuse and HTN presenting with acute epigastric pain and 1 episode of emesis after eating "a lot of junk food", and admitted for acute on chronic pancreatitis.  Reportedly sober since January 2023.  Lipase elevated to 1672.  CMP, CBC and UA without significant finding.  He was admitted and started on IV fluid and analgesics.  MRCP ordered.  The next day, MRCP showed acute on chronic pancreatitis with stable complex pancreatic cyst.   Subjective: Seen and examined earlier this morning.  No major events overnight of this morning.  He reports improvement in his pain but rates his pain about 7/10.  Pain is mainly above his bellybutton.  No radiation to his back or shoulder.  No nausea or vomiting.  Likes to try clear liquid diet.  No fever or chills.  Objective: Vitals:   07/03/22 2145 07/04/22 0145 07/04/22 0534 07/04/22 0946  BP: 130/72 (!) 164/86 (!) 160/84 (!) 145/79  Pulse: (!) 54 71 74 72  Resp: '16 18 18 18  '$ Temp: 97.7 F (36.5 C) 97.8 F (36.6 C) 98.6 F (37 C) 98.8 F (37.1 C)  TempSrc: Oral Oral Oral Oral  SpO2: 97% 98% 95% 97%  Weight:      Height:        Examination:  GENERAL: No apparent distress.  Nontoxic. HEENT: MMM.  Vision and hearing grossly intact.  NECK: Supple.  No apparent JVD.  RESP:  No IWOB.  Fair aeration bilaterally. CVS:  RRR. Heart sounds normal.  ABD/GI/GU: BS+. Abd soft.  Tenderness to palpation over epigastric area. MSK/EXT:  Moves extremities. No apparent deformity. No edema.  SKIN: no apparent skin lesion or wound NEURO: Awake, alert and oriented appropriately.  No apparent focal neuro deficit. PSYCH:  Calm. Normal affect.   Procedures:  None  Microbiology summarized: None  Assessment and plan: Principal Problem:   Acute alcoholic pancreatitis Active Problems:   Pancreatic cyst   Benign essential HTN   Alcohol abuse  Acute on chronic alcoholic pancreatitis: Likely due to "a lot of greasy food".  Sober for 7 months.  LFT within normal.  Lipase elevated to 1700 but improved to 385.  MRCP confirms acute on chronic pancreatitis with stable pancreatic cyst.  Still with significant pain but improved. 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   -Follow-up pre and postcontrast MRI/MRCP in 1 year recommended.   History of alcohol abuse: Reportedly sober for 7 months.  Congratulated.  Essential hypertension: BP slightly elevated partly due to pain. -Resume home amlodipine.  Body mass index is 29.57 kg/m.           DVT prophylaxis:  Place and maintain sequential compression device Start: 07/04/22 0916 Patient refused Lovenox and prefers to ambulate and use SCD when he is in bed  Code Status: Full code Family Communication: None Level of care: Med-Surg Status is: Observation The patient will require care spanning > 2 midnights and should be moved to inpatient because: Due to acute on chronic pancreatitis   Final disposition: Likely home in the next 24 to 48 hours Consultants:  None  Sch Meds:  Scheduled Meds:   Continuous Infusions:  lactated ringers 125 mL/hr at 07/04/22 0719   PRN Meds:.acetaminophen **OR**  acetaminophen, morphine injection, ondansetron **OR** ondansetron (ZOFRAN) IV  Antimicrobials: Anti-infectives (From admission, onward)    None        I have personally reviewed the following labs and images: CBC: Recent Labs  Lab 07/03/22 1445 07/04/22 0438  WBC 4.8 5.9  NEUTROABS 2.4  --   HGB 15.1 14.1  HCT 44.6 42.6  MCV 85.8 87.8  PLT 201 190   BMP &GFR Recent Labs  Lab 07/03/22 1445 07/04/22 0438  NA 138 139  K 4.1 4.2  CL 106 106  CO2 26 25  GLUCOSE 95 106*  BUN 13 12  CREATININE 0.94 0.88  CALCIUM 9.0 8.9   Estimated Creatinine Clearance: 120.2 mL/min (by C-G formula based on SCr of 0.88 mg/dL). Liver & Pancreas: Recent Labs  Lab 07/03/22 1445 07/04/22 0438  AST 27 21  ALT 24 20  ALKPHOS 81 72  BILITOT 0.8 0.4  PROT 7.4 7.0  ALBUMIN 3.8 3.5   Recent Labs  Lab 07/03/22 1445 07/04/22 0715  LIPASE 1,672* 385*   No results for input(s): "AMMONIA" in the last 168 hours. Diabetic: No results for input(s): "HGBA1C" in the last 72 hours. No results for input(s): "GLUCAP" in the last 168 hours. Cardiac Enzymes: No results for input(s): "CKTOTAL", "CKMB", "CKMBINDEX", "TROPONINI" in the last 168 hours. No results for input(s): "PROBNP" in the last 8760 hours. Coagulation Profile: No results for input(s): "INR", "PROTIME" in the last 168 hours. Thyroid Function Tests: No results for input(s): "TSH", "T4TOTAL", "FREET4", "T3FREE", "THYROIDAB" in the last 72 hours. Lipid Profile: No results for input(s): "CHOL", "HDL", "LDLCALC", "TRIG", "CHOLHDL", "LDLDIRECT" in the last 72 hours. Anemia Panel: No results for input(s): "VITAMINB12", "FOLATE", "FERRITIN", "TIBC", "IRON", "RETICCTPCT" in the last 72 hours. Urine analysis:    Component Value Date/Time   COLORURINE YELLOW 07/04/2022 0342   APPEARANCEUR HAZY (A) 07/04/2022 0342   LABSPEC 1.026 07/04/2022 0342   PHURINE 6.0 07/04/2022 0342   GLUCOSEU NEGATIVE 07/04/2022 0342   HGBUR NEGATIVE  07/04/2022 0342   BILIRUBINUR NEGATIVE 07/04/2022 0342   KETONESUR NEGATIVE 07/04/2022 0342   PROTEINUR NEGATIVE 07/04/2022 0342   UROBILINOGEN 0.2 10/27/2011 0957   NITRITE NEGATIVE 07/04/2022 0342   LEUKOCYTESUR NEGATIVE 07/04/2022 0342   Sepsis Labs: Invalid input(s): "PROCALCITONIN", "LACTICIDVEN"  Microbiology: No results found for this or any previous visit (from the past 240 hour(s)).  Radiology Studies: MR ABDOMEN MRCP W WO CONTAST  Result Date: 07/04/2022 CLINICAL DATA:  No acute abnormality. EXAM: MRI ABDOMEN WITHOUT AND WITH CONTRAST (INCLUDING MRCP) TECHNIQUE: Multiplanar multisequence MR imaging of the abdomen was performed both before and after the administration of intravenous contrast. Heavily T2-weighted images of the biliary and pancreatic ducts were obtained, and three-dimensional MRCP images were rendered by post processing. CONTRAST:  19m GADAVIST GADOBUTROL 1 MMOL/ML IV SOLN COMPARISON:  Ultrasound June 02, 2022 and MRI abdomen December 23, 2021. FINDINGS: Lower chest: Heterogeneous signal in the bilateral lung bases commonly reflects atelectasis. Hepatobiliary: No significant hepatic steatosis. No suspicious hepatic lesion. Periportal edema. Gallbladder is unremarkable. No biliary ductal  dilation, the common bile duct measures 4 mm in diameter. Pancreas: Reduced intrinsic T1 signal of the pancreatic head with peripancreatic edema. Similar size of the nonenhancing T2 hyperintense well-circumscribed lesion in the pancreatic head which again demonstrates internal complexity measuring 16 x 15 x 13 mm on images 23/16 and 13/9 previously measuring 17 x 16 x 14 mm when remeasured for consistency, but no suspicious postcontrast enhancement. Similar prominence of the pancreatic duct without discrete pancreatic ductal dilation. Spleen:  No splenomegaly or focal splenic lesion. Adrenals/Urinary Tract: Bilateral adrenal glands appear normal. No hydronephrosis. No solid enhancing renal  mass. Stomach/Bowel: Mild periampullary duodenal inflammation. Colonic diverticulosis without findings of acute diverticulitis. Vascular/Lymphatic: No pathologically enlarged lymph nodes identified. No abdominal aortic aneurysm demonstrated. Other:  No significant abdominal free fluid. Musculoskeletal: No suspicious bone lesions identified. IMPRESSION: 1. Acute on chronic pancreatitis. 2. 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 Recommend follow up pre and post contrast MRI/MRCP in 1 year. This recommendation follows ACR consensus guidelines: Management of Incidental Pancreatic Cysts: A White Paper of the ACR Incidental Findings Committee. [place] 8588;50:[id number]. 3. No biliary ductal dilation or choledocholithiasis. 4. Reactive duodenitis. Electronically Signed   By: Dahlia Bailiff M.D.   On: 07/04/2022 09:46   MR 3D Recon At Scanner  Result Date: 07/04/2022 CLINICAL DATA:  No acute abnormality. EXAM: MRI ABDOMEN WITHOUT AND WITH CONTRAST (INCLUDING MRCP) TECHNIQUE: Multiplanar multisequence MR imaging of the abdomen was performed both before and after the administration of intravenous contrast. Heavily T2-weighted images of the biliary and pancreatic ducts were obtained, and three-dimensional MRCP images were rendered by post processing. CONTRAST:  54m GADAVIST GADOBUTROL 1 MMOL/ML IV SOLN COMPARISON:  Ultrasound June 02, 2022 and MRI abdomen December 23, 2021. FINDINGS: Lower chest: Heterogeneous signal in the bilateral lung bases commonly reflects atelectasis. Hepatobiliary: No significant hepatic steatosis. No suspicious hepatic lesion. Periportal edema. Gallbladder is unremarkable. No biliary ductal dilation, the common bile duct  measures 4 mm in diameter. Pancreas: Reduced intrinsic T1 signal of the pancreatic head with peripancreatic edema. Similar size of the nonenhancing T2 hyperintense well-circumscribed lesion in the pancreatic head which again demonstrates internal complexity measuring 16 x 15 x 13 mm on images 23/16 and 13/9 previously measuring 17 x 16 x 14 mm when remeasured for consistency, but no suspicious postcontrast enhancement. Similar prominence of the pancreatic duct without discrete pancreatic ductal dilation. Spleen:  No splenomegaly or focal splenic lesion. Adrenals/Urinary Tract: Bilateral adrenal glands appear normal. No hydronephrosis. No solid enhancing renal mass. Stomach/Bowel: Mild periampullary duodenal inflammation. Colonic diverticulosis without findings of acute diverticulitis. Vascular/Lymphatic: No pathologically enlarged lymph nodes identified. No abdominal aortic aneurysm demonstrated. Other:  No significant abdominal free fluid. Musculoskeletal: No suspicious bone lesions identified. IMPRESSION: 1. Acute on chronic pancreatitis. 2. 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 Recommend follow up pre and post contrast MRI/MRCP in 1 year. This recommendation follows ACR consensus guidelines: Management of Incidental Pancreatic Cysts: A White Paper of the ACR Incidental Findings Committee. J Am Coll Radiol 28786;76:[id number] 3. No biliary ductal dilation or  choledocholithiasis. 4. Reactive duodenitis. Electronically Signed   By: Dahlia Bailiff M.D.   On: 07/04/2022 09:46      Randall Colden T. [place]  If 7PM-7AM, please contact night-coverage [email or web address] 07/04/2022, 11:36 AM

## 2022-07-04 NOTE — Progress Notes (Signed)
Patient refusing lovenox injection, MD notified. SCD's ordered and placed on patient.

## 2022-07-04 NOTE — Progress Notes (Signed)
  Transition of Care [facility]) Screening Note   Patient Details  Name: Bilaal Leib Date of Birth: 1971/06/01   Transition of Care [facility]) CM/SW Contact:    Lennart Pall, LCSW Phone Number: 07/04/2022, 12:16 PM    Transition of Care Department [facility]) has reviewed patient and no TOC needs have been identified at this time. We will continue to monitor patient advancement through interdisciplinary progression rounds. If new patient transition needs arise, please place a TOC consult.

## 2022-07-05 DIAGNOSIS — K852 Alcohol induced acute pancreatitis without necrosis or infection: Secondary | ICD-10-CM | POA: Diagnosis not present

## 2022-07-05 DIAGNOSIS — K862 Cyst of pancreas: Secondary | ICD-10-CM | POA: Diagnosis not present

## 2022-07-05 DIAGNOSIS — I10 Essential (primary) hypertension: Secondary | ICD-10-CM | POA: Diagnosis not present

## 2022-07-05 DIAGNOSIS — F1011 Alcohol abuse, in remission: Secondary | ICD-10-CM | POA: Diagnosis not present

## 2022-07-05 LAB — COMPREHENSIVE METABOLIC PANEL
ALT: 19 U/L (ref 0–44)
AST: 19 U/L (ref 15–41)
Albumin: 3.6 g/dL (ref 3.5–5.0)
Alkaline Phosphatase: 72 U/L (ref 38–126)
Anion gap: 7 (ref 5–15)
BUN: 6 mg/dL (ref 6–20)
CO2: 28 mmol/L (ref 22–32)
Calcium: 9 mg/dL (ref 8.9–10.3)
Chloride: 106 mmol/L (ref 98–111)
Creatinine, Ser: 0.91 mg/dL (ref 0.61–1.24)
GFR, Estimated: >60 mL/min (ref >60)
Glucose, Bld: 99 mg/dL (ref 70–99)
Potassium: 3.6 mmol/L (ref 3.5–5.1)
Sodium: 141 mmol/L (ref 135–145)
Total Bilirubin: 0.8 mg/dL (ref 0.3–1.2)
Total Protein: 7.1 g/dL (ref 6.5–8.1)

## 2022-07-05 LAB — MAGNESIUM: Magnesium: 1.8 mg/dL (ref 1.7–2.4)

## 2022-07-05 LAB — LIPASE, BLOOD: Lipase: 417 U/L — ABNORMAL HIGH (ref 11–51)

## 2022-07-05 LAB — PHOSPHORUS: Phosphorus: 2.4 mg/dL — ABNORMAL LOW (ref 2.5–4.6)

## 2022-07-05 MED ORDER — HYDROMORPHONE HCL 1 MG/ML IJ SOLN
0.5000 mg | INTRAMUSCULAR | Status: DC | PRN
  Administered 2022-07-05 – 2022-07-07 (×6): 0.5 mg via INTRAVENOUS
  Filled 2022-07-05 (×6): qty 0.5

## 2022-07-05 MED ORDER — OXYCODONE HCL 5 MG PO TABS
5.0000 mg | ORAL_TABLET | Freq: Four times a day (QID) | ORAL | Status: DC | PRN
  Administered 2022-07-05 – 2022-07-08 (×11): 5 mg via ORAL
  Filled 2022-07-05 (×11): qty 1

## 2022-07-05 NOTE — Progress Notes (Signed)
In room with NT when Patient's vitals taken, pt has a fever of 102.68F. Checked twice within 2 minutes and remained approximately 102F. MEWS protocol started.  Discussed with Charge RN Oley Balm) and gave patient Tylenol '650mg'$  along with 0.'5mg'$  of Dilaudid since patient also was experiencing severe ABD pain.  Will assess patient's vitals within the hour of administering tylenol and continue to monitor.    07/05/22 2151  Assess: MEWS Score  Temp (!) 102.5 F (39.2 C)  BP (!) 143/82  MAP (mmHg) 99  Pulse Rate 97  Resp 17  SpO2 97 %  O2 Device Room Air  Assess: MEWS Score  MEWS Temp 2  MEWS Systolic 0  MEWS Pulse 0  MEWS RR 0  MEWS LOC 0  MEWS Score 2  MEWS Score Color Yellow  Assess: if the MEWS score is Yellow or Red  Were vital signs taken at a resting state? Yes  Focused Assessment No change from prior assessment  Does the patient meet 2 or more of the SIRS criteria? No  MEWS guidelines implemented *See Row Information* Yes  Treat  MEWS Interventions Administered prn meds/treatments  Take Vital Signs  Increase Vital Sign Frequency  Yellow: Q 2hr X 2 then Q 4hr X 2, if remains yellow, continue Q 4hrs  Escalate  MEWS: Escalate Yellow: discuss with charge nurse/RN and consider discussing with provider and RRT  Notify: Charge Nurse/RN  Name of Charge Nurse/RN Notified Adrianna, RN  Date Charge Nurse/RN Notified 07/05/22  Time Charge Nurse/RN Notified 2153  Assess: SIRS CRITERIA  SIRS Temperature  1  SIRS Pulse 1  SIRS Respirations  0  SIRS WBC 1  SIRS Score Sum  3

## 2022-07-05 NOTE — Progress Notes (Signed)
PROGRESS NOTE  Max Ayers LFY:[id number] DOB: 08/25/1971   PCP: Willeen Niece, PA  Patient is from: Home.  DOA: 07/03/2022 LOS: 1  Chief complaints Chief Complaint  Patient presents with   Abdominal Pain     Brief Narrative / Interim history: 51 year old M with history of alcoholic pancreatitis, complex pancreatic cyst, prior EtOH abuse and HTN presenting with acute epigastric pain and 1 episode of emesis after eating "a lot of junk food", and admitted for acute on chronic pancreatitis.  Reportedly sober since January 2023.  Lipase elevated to 1672.  CMP, CBC and UA without significant finding.  He was admitted and started on IV fluid and analgesics.  MRCP ordered.  The next day, MRCP showed acute on chronic pancreatitis with stable complex pancreatic cyst.   Subjective: Seen and examined earlier this morning.  No major events overnight of this morning.  Reports significant abdominal pain with deep breathing, yawning and coughing.  No shortness of breath.  Passing gas.  No nausea or vomiting.  Objective: Vitals:   07/04/22 0946 07/04/22 1414 07/04/22 2148 07/05/22 0457  BP: (!) 145/79 (!) 142/83 125/72 (!) 151/91  Pulse: 72 84 66 93  Resp: '18 18 18 17  '$ Temp: 98.8 F (37.1 C) 99.3 F (37.4 C) 98.3 F (36.8 C) 99.3 F (37.4 C)  TempSrc: Oral Oral Oral Oral  SpO2: 97% 99% 98% 97%  Weight:      Height:        Examination:  GENERAL: No apparent distress.  Nontoxic. HEENT: MMM.  Vision and hearing grossly intact.  NECK: Supple.  No apparent JVD.  RESP:  No IWOB.  Fair aeration bilaterally. CVS:  RRR. Heart sounds normal.  ABD/GI/GU: BS+. Abd soft.  Tenderness to palpation across upper abdomen. MSK/EXT:  Moves extremities. No apparent deformity. No edema.  SKIN: no apparent skin lesion or wound NEURO: Awake and alert. Oriented appropriately.  No apparent focal neuro deficit. PSYCH: Calm. Normal affect.   Procedures:  None  Microbiology  summarized: None  Assessment and plan: Principal Problem:   Acute alcoholic pancreatitis Active Problems:   Pancreatic cyst   Benign essential HTN   History of alcohol abuse  Acute on chronic alcoholic pancreatitis: Likely due to "a lot of greasy food".  Sober for 7 months.  LFT within normal.  Lipase 1700> 385> 417.  MRCP confirms acute on chronic pancreatitis with stable pancreatic cyst.  Still with significant pain and tenderness. 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   -Follow-up pre and postcontrast MRI/MRCP in 1 year recommended.   History of alcohol abuse: Reportedly sober for 7 months.  Congratulated.  Essential hypertension: BP slightly elevated partly due to pain. -Continue home amlodipine.  Body mass index is 29.57 kg/m.           DVT prophylaxis:  Place and maintain sequential compression device Start: 07/04/22 0916 Patient refused Lovenox and prefers to ambulate and use SCD when he is in bed  Code Status: Full code Family Communication: None Level of care: Med-Surg Status is: Inpatient The patient will remain inpatient because: Due to acute on chronic pancreatitis   Final disposition: Likely home in the next 24 to 48 hours Consultants:  None  Sch Meds:  Scheduled Meds:  amLODipine  5 mg Oral Daily    Continuous Infusions:  lactated ringers 125 mL/hr at  07/05/22 0104   PRN Meds:.acetaminophen **OR** acetaminophen, HYDROmorphone (DILAUDID) injection, ondansetron **OR** ondansetron (ZOFRAN) IV, oxyCODONE  Antimicrobials: Anti-infectives (From admission, onward)    None        I have personally reviewed the following labs and images: CBC: Recent Labs  Lab 07/03/22 1445 07/04/22 0438  WBC 4.8 5.9  NEUTROABS 2.4  --   HGB 15.1 14.1  HCT 44.6 42.6  MCV 85.8 87.8  PLT 201 190   BMP &GFR Recent Labs  Lab 07/03/22 1445 07/04/22 0438 07/05/22 0417  NA 138 139 141  K 4.1 4.2 3.6  CL 106 106 106  CO2 '26 25 28  '$ GLUCOSE 95 106* 99  BUN '13 12 6  '$ CREATININE 0.94 0.88 0.91  CALCIUM 9.0 8.9 9.0  MG  --   --  1.8  PHOS  --   --  2.4*   Estimated Creatinine Clearance: 116.3 mL/min (by C-G formula based on SCr of 0.91 mg/dL). Liver & Pancreas: Recent Labs  Lab 07/03/22 1445 07/04/22 0438 07/05/22 0417  AST '27 21 19  '$ ALT '24 20 19  '$ ALKPHOS 81 72 72  BILITOT 0.8 0.4 0.8  PROT 7.4 7.0 7.1  ALBUMIN 3.8 3.5 3.6   Recent Labs  Lab 07/03/22 1445 07/04/22 0715 07/05/22 0417  LIPASE 1,672* 385* 417*   No results for input(s): "AMMONIA" in the last 168 hours. Diabetic: No results for input(s): "HGBA1C" in the last 72 hours. No results for input(s): "GLUCAP" in the last 168 hours. Cardiac Enzymes: No results for input(s): "CKTOTAL", "CKMB", "CKMBINDEX", "TROPONINI" in the last 168 hours. No results for input(s): "PROBNP" in the last 8760 hours. Coagulation Profile: No results for input(s): "INR", "PROTIME" in the last 168 hours. Thyroid Function Tests: No results for input(s): "TSH", "T4TOTAL", "FREET4", "T3FREE", "THYROIDAB" in the last 72 hours. Lipid Profile: No results for input(s): "CHOL", "HDL", "LDLCALC", "TRIG", "CHOLHDL", "LDLDIRECT" in the last 72 hours. Anemia Panel: No results for input(s): "VITAMINB12", "FOLATE", "FERRITIN", "TIBC", "IRON", "RETICCTPCT" in the last 72 hours. Urine analysis:    Component  Value Date/Time   COLORURINE YELLOW 07/04/2022 0342   APPEARANCEUR HAZY (A) 07/04/2022 0342   LABSPEC 1.026 07/04/2022 0342   PHURINE 6.0 07/04/2022 0342   GLUCOSEU NEGATIVE 07/04/2022 0342   HGBUR NEGATIVE 07/04/2022 0342   BILIRUBINUR NEGATIVE 07/04/2022 0342   KETONESUR NEGATIVE 07/04/2022 0342   PROTEINUR NEGATIVE 07/04/2022 0342   UROBILINOGEN 0.2 10/27/2011 0957   NITRITE NEGATIVE 07/04/2022 0342   LEUKOCYTESUR NEGATIVE 07/04/2022 0342   Sepsis Labs: Invalid input(s): "PROCALCITONIN", "LACTICIDVEN"  Microbiology: No results found for this or any previous visit (from the past 240 hour(s)).  Radiology Studies: No results found.    Mikenzie Mccannon T. Jackson  If 7PM-7AM, please contact night-coverage [email or web address] 07/05/2022, 12:24 PM

## 2022-07-06 DIAGNOSIS — I10 Essential (primary) hypertension: Secondary | ICD-10-CM | POA: Diagnosis not present

## 2022-07-06 DIAGNOSIS — R509 Fever, unspecified: Secondary | ICD-10-CM

## 2022-07-06 DIAGNOSIS — K862 Cyst of pancreas: Secondary | ICD-10-CM | POA: Diagnosis not present

## 2022-07-06 DIAGNOSIS — F1011 Alcohol abuse, in remission: Secondary | ICD-10-CM | POA: Diagnosis not present

## 2022-07-06 DIAGNOSIS — K852 Alcohol induced acute pancreatitis without necrosis or infection: Secondary | ICD-10-CM | POA: Diagnosis not present

## 2022-07-06 LAB — CBC WITH DIFFERENTIAL/PLATELET
Abs Immature Granulocytes: 0.01 10*3/uL (ref 0.00–0.07)
Basophils Absolute: 0 10*3/uL (ref 0.0–0.1)
Basophils Relative: 0 %
Eosinophils Absolute: 0.1 10*3/uL (ref 0.0–0.5)
Eosinophils Relative: 1 %
HCT: 39.2 % (ref 39.0–52.0)
Hemoglobin: 13.2 g/dL (ref 13.0–17.0)
Immature Granulocytes: 0 %
Lymphocytes Relative: 25 %
Lymphs Abs: 1.6 10*3/uL (ref 0.7–4.0)
MCH: 29.7 pg (ref 26.0–34.0)
MCHC: 33.7 g/dL (ref 30.0–36.0)
MCV: 88.1 fL (ref 80.0–100.0)
Monocytes Absolute: 0.9 10*3/uL (ref 0.1–1.0)
Monocytes Relative: 14 %
Neutro Abs: 3.9 10*3/uL (ref 1.7–7.7)
Neutrophils Relative %: 60 %
Platelets: 155 10*3/uL (ref 150–400)
RBC: 4.45 MIL/uL (ref 4.22–5.81)
RDW: 12.7 % (ref 11.5–15.5)
WBC: 6.5 10*3/uL (ref 4.0–10.5)
nRBC: 0 % (ref 0.0–0.2)

## 2022-07-06 LAB — COMPREHENSIVE METABOLIC PANEL
ALT: 18 U/L (ref 0–44)
AST: 16 U/L (ref 15–41)
Albumin: 3.2 g/dL — ABNORMAL LOW (ref 3.5–5.0)
Alkaline Phosphatase: 69 U/L (ref 38–126)
Anion gap: 9 (ref 5–15)
BUN: 6 mg/dL (ref 6–20)
CO2: 25 mmol/L (ref 22–32)
Calcium: 9 mg/dL (ref 8.9–10.3)
Chloride: 104 mmol/L (ref 98–111)
Creatinine, Ser: 0.91 mg/dL (ref 0.61–1.24)
GFR, Estimated: >60 mL/min (ref >60)
Glucose, Bld: 115 mg/dL — ABNORMAL HIGH (ref 70–99)
Potassium: 3.7 mmol/L (ref 3.5–5.1)
Sodium: 138 mmol/L (ref 135–145)
Total Bilirubin: 1 mg/dL (ref 0.3–1.2)
Total Protein: 6.6 g/dL (ref 6.5–8.1)

## 2022-07-06 LAB — MAGNESIUM: Magnesium: 1.7 mg/dL (ref 1.7–2.4)

## 2022-07-06 LAB — LIPASE, BLOOD: Lipase: 132 U/L — ABNORMAL HIGH (ref 11–51)

## 2022-07-06 LAB — PHOSPHORUS: Phosphorus: 3.5 mg/dL (ref 2.5–4.6)

## 2022-07-06 NOTE — Progress Notes (Signed)
PROGRESS NOTE  Max Ayers AYT:[id number] DOB: May 01, 1971   PCP: Willeen Niece, PA  Patient is from: Home.  DOA: 07/03/2022 LOS: 2  Chief complaints Chief Complaint  Patient presents with   Abdominal Pain     Brief Narrative / Interim history: 51 year old M with history of alcoholic pancreatitis, complex pancreatic cyst, prior EtOH abuse and HTN presenting with acute epigastric pain and 1 episode of emesis after eating "a lot of junk food", and admitted for acute on chronic pancreatitis.  Reportedly sober since January 2023.  Lipase elevated to 1672.  CMP, CBC and UA without significant finding.  He was admitted and started on IV fluid and analgesics.  MRCP ordered.  The next day, MRCP showed acute on chronic pancreatitis with stable complex pancreatic cyst.  Slowly improving.  Subjective: Seen and examined earlier this morning.  He spiked fever to 102.5 about 10 PM last night.  He has no respiratory or UTI symptoms.  Continues to endorse abdominal pain with deep breathing.  He rates his pain 7/10.  He denies nausea or vomiting.  Has not a bowel movement but passing gas.  Objective: Vitals:   07/05/22 2151 07/05/22 2256 07/06/22 0054 07/06/22 0455  BP: (!) 143/82 138/77 126/80 129/78  Pulse: 97 93 82 79  Resp: '17 18 17 17  '$ Temp: (!) 102.5 F (39.2 C) (!) 101 F (38.3 C) 99.4 F (37.4 C) 98.6 F (37 C)  TempSrc: Oral Oral Oral Oral  SpO2: 97% 95% 95% 97%  Weight:      Height:        Examination:  GENERAL: No apparent distress.  Nontoxic. HEENT: MMM.  Vision and hearing grossly intact.  NECK: Supple.  No apparent JVD.  RESP:  No IWOB.  Fair aeration bilaterally. CVS:  RRR. Heart sounds normal.  ABD/GI/GU: BS+. Abd soft.  Tenderness to palpation across upper abdomen. MSK/EXT:  Moves extremities. No apparent deformity. No edema.  SKIN: no apparent skin lesion or wound NEURO: Awake and alert. Oriented appropriately.  No apparent focal neuro deficit. PSYCH: Calm.  Normal affect.   Procedures:  None  Microbiology summarized: None  Assessment and plan: Principal Problem:   Acute alcoholic pancreatitis Active Problems:   Pancreatic cyst   Benign essential HTN   History of alcohol abuse  Acute on chronic alcoholic pancreatitis: Likely due to "a lot of greasy food".  Sober for 7 months.  LFT within normal.  Lipase 1700> 385> 417> 132.  MRCP confirms acute on chronic pancreatitis with stable pancreatic cyst.  Still with significant pain and tenderness. -Advance to full liquid diet. 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   -Follow-up pre and postcontrast MRI/MRCP in 1 year recommended.  Fever: Unknown source.  Due to pancreatitis?  He has complex waxing and waning pancreatic cyst since 2021.  No cardiopulmonary or UTI symptoms.  Has no leukocytosis either. -Continue monitoring -CT abdomen and pelvis with contrast if he spikes another fever   History of alcohol abuse: Reportedly sober for 7 months.  Congratulated.  Essential  hypertension: Normotensive. -Continue home amlodipine.  Body mass index is 29.57 kg/m.           DVT prophylaxis:  Place and maintain sequential compression device Start: 07/04/22 0916 Patient refused Lovenox and prefers to ambulate and use SCD when he is in bed  Code Status: Full code Family Communication: None Level of care: Med-Surg Status is: Inpatient The patient will remain  inpatient because: Due to acute on chronic pancreatitis and fever   Final disposition: Likely home in the next 24 to 48 hours Consultants:  None  Sch Meds:  Scheduled Meds:  amLODipine  5 mg Oral Daily    Continuous Infusions:  lactated ringers 125 mL/hr at 07/06/22 0104   PRN Meds:.acetaminophen **OR** acetaminophen, HYDROmorphone (DILAUDID) injection, ondansetron **OR** ondansetron (ZOFRAN) IV, oxyCODONE  Antimicrobials: Anti-infectives (From admission, onward)    None        I have personally reviewed the following labs and images: CBC: Recent Labs  Lab 07/03/22 1445 07/04/22 0438 07/06/22 0441  WBC 4.8 5.9 6.5  NEUTROABS 2.4  --  3.9  HGB 15.1 14.1 13.2  HCT 44.6 42.6 39.2  MCV 85.8 87.8 88.1  PLT 201 190 155   BMP &GFR Recent Labs  Lab 07/03/22 1445 07/04/22 0438 07/05/22 0417 07/06/22 0441  NA 138 139 141 138  K 4.1 4.2 3.6 3.7  CL 106 106 106 104  CO2 '26 25 28 25  '$ GLUCOSE 95 106* 99 115*  BUN '13 12 6 6  '$ CREATININE 0.94 0.88 0.91 0.91  CALCIUM 9.0 8.9 9.0 9.0  MG  --   --  1.8 1.7  PHOS  --   --  2.4* 3.5   Estimated Creatinine Clearance: 116.3 mL/min (by C-G formula based on SCr of 0.91 mg/dL). Liver & Pancreas: Recent Labs  Lab 07/03/22 1445 07/04/22 0438 07/05/22 0417 07/06/22 0441  AST '27 21 19 16  '$ ALT '24 20 19 18  '$ ALKPHOS 81 72 72 69  BILITOT 0.8 0.4 0.8 1.0  PROT 7.4 7.0 7.1 6.6  ALBUMIN 3.8 3.5 3.6 3.2*   Recent Labs  Lab 07/03/22 1445 07/04/22 0715 07/05/22 0417 07/06/22 0441  LIPASE 1,672* 385* 417* 132*   No results for input(s): "AMMONIA" in the last 168 hours. Diabetic: No results for input(s): "HGBA1C" in the last 72 hours. No results for input(s): "GLUCAP" in the last 168 hours. Cardiac Enzymes: No results for input(s): "CKTOTAL", "CKMB", "CKMBINDEX", "TROPONINI" in the last 168 hours. No results for input(s): "PROBNP" in the last 8760 hours. Coagulation Profile: No results for input(s): "INR", "PROTIME" in  the last 168 hours. Thyroid Function Tests: No results for input(s): "TSH", "T4TOTAL", "FREET4", "T3FREE", "THYROIDAB" in the last 72 hours. Lipid Profile: No results for input(s): "CHOL", "HDL", "LDLCALC", "TRIG", "CHOLHDL", "LDLDIRECT" in the last 72 hours. Anemia Panel: No results for input(s): "VITAMINB12", "FOLATE", "FERRITIN", "TIBC", "IRON", "RETICCTPCT" in the last 72 hours. Urine analysis:    Component Value Date/Time   COLORURINE YELLOW 07/04/2022 0342   APPEARANCEUR HAZY (A) 07/04/2022 0342   LABSPEC 1.026 07/04/2022 0342   PHURINE 6.0 07/04/2022 0342   GLUCOSEU NEGATIVE 07/04/2022 0342   HGBUR NEGATIVE 07/04/2022 0342   BILIRUBINUR NEGATIVE 07/04/2022 0342   KETONESUR NEGATIVE 07/04/2022 0342   PROTEINUR NEGATIVE 07/04/2022 0342   UROBILINOGEN 0.2 10/27/2011 0957   NITRITE NEGATIVE 07/04/2022 0342   LEUKOCYTESUR NEGATIVE 07/04/2022 0342   Sepsis Labs: Invalid input(s): "PROCALCITONIN", "LACTICIDVEN"  Microbiology: No results found for this or any previous visit (from  the past 240 hour(s)).  Radiology Studies: No results found.    Averie Hornbaker T. [place]  If 7PM-7AM, please contact night-coverage [email or web address] 07/06/2022, 11:07 AM

## 2022-07-07 ENCOUNTER — Encounter: Payer: Self-pay | Admitting: Gastroenterology

## 2022-07-07 DIAGNOSIS — I10 Essential (primary) hypertension: Secondary | ICD-10-CM | POA: Diagnosis not present

## 2022-07-07 DIAGNOSIS — F1011 Alcohol abuse, in remission: Secondary | ICD-10-CM | POA: Diagnosis not present

## 2022-07-07 DIAGNOSIS — K862 Cyst of pancreas: Secondary | ICD-10-CM | POA: Diagnosis not present

## 2022-07-07 DIAGNOSIS — K852 Alcohol induced acute pancreatitis without necrosis or infection: Secondary | ICD-10-CM | POA: Diagnosis not present

## 2022-07-07 LAB — CBC WITH DIFFERENTIAL/PLATELET
Abs Immature Granulocytes: 0.01 10*3/uL (ref 0.00–0.07)
Basophils Absolute: 0 10*3/uL (ref 0.0–0.1)
Basophils Relative: 0 %
Eosinophils Absolute: 0.2 10*3/uL (ref 0.0–0.5)
Eosinophils Relative: 3 %
HCT: 37.7 % — ABNORMAL LOW (ref 39.0–52.0)
Hemoglobin: 12.7 g/dL — ABNORMAL LOW (ref 13.0–17.0)
Immature Granulocytes: 0 %
Lymphocytes Relative: 37 %
Lymphs Abs: 2.3 10*3/uL (ref 0.7–4.0)
MCH: 29.3 pg (ref 26.0–34.0)
MCHC: 33.7 g/dL (ref 30.0–36.0)
MCV: 87.1 fL (ref 80.0–100.0)
Monocytes Absolute: 0.8 10*3/uL (ref 0.1–1.0)
Monocytes Relative: 14 %
Neutro Abs: 2.8 10*3/uL (ref 1.7–7.7)
Neutrophils Relative %: 46 %
Platelets: 158 10*3/uL (ref 150–400)
RBC: 4.33 MIL/uL (ref 4.22–5.81)
RDW: 12.4 % (ref 11.5–15.5)
WBC: 6.2 10*3/uL (ref 4.0–10.5)
nRBC: 0 % (ref 0.0–0.2)

## 2022-07-07 LAB — LIPASE, BLOOD: Lipase: 59 U/L — ABNORMAL HIGH (ref 11–51)

## 2022-07-07 NOTE — Progress Notes (Signed)
PROGRESS NOTE  Max Ayers TKZ:[id number] DOB: January 25, 1971   PCP: Willeen Niece, PA  Patient is from: Home.  DOA: 07/03/2022 LOS: 3  Chief complaints Chief Complaint  Patient presents with   Abdominal Pain     Brief Narrative / Interim history: 51 year old M with history of alcoholic pancreatitis, complex pancreatic cyst, prior EtOH abuse and HTN presenting with acute epigastric pain and 1 episode of emesis after eating "a lot of junk food", and admitted for acute on chronic pancreatitis.  Reportedly sober since January 2023.  Lipase elevated to 1672.  CMP, CBC and UA without significant finding.  He was admitted and started on IV fluid and analgesics.  MRCP ordered.  The next day, MRCP showed acute on chronic pancreatitis with stable complex pancreatic cyst.  Slowly improving.  Subjective: Seen and examined earlier this morning.  No major events overnight of this morning.  Reported significant improvement in his pain this morning and he was advanced to soft diet.  He had severe abdominal pain with nausea after soft diet.  Objective: Vitals:   07/06/22 2157 07/07/22 0154 07/07/22 0633 07/07/22 1419  BP: (!) 158/91 129/77 118/71 122/76  Pulse: 89 80 69 63  Resp: '18 18 18   '$ Temp: 99.9 F (37.7 C) 98.8 F (37.1 C) 98.6 F (37 C) 98.1 F (36.7 C)  TempSrc: Oral Oral Oral Oral  SpO2: 97% 97% 98% 99%  Weight:      Height:        Examination:  GENERAL: No apparent distress.  Nontoxic. HEENT: MMM.  Vision and hearing grossly intact.  NECK: Supple.  No apparent JVD.  RESP:  No IWOB.  Fair aeration bilaterally. CVS:  RRR. Heart sounds normal.  ABD/GI/GU: BS+. Abd soft.  Mild tenderness across upper abdomen. MSK/EXT:  Moves extremities. No apparent deformity. No edema.  SKIN: no apparent skin lesion or wound NEURO: Awake and alert. Oriented appropriately.  No apparent focal neuro deficit. PSYCH: Calm. Normal affect.   Procedures:  None  Microbiology  summarized: None  Assessment and plan: Principal Problem:   Acute alcoholic pancreatitis Active Problems:   Pancreatic cyst   Benign essential HTN   History of alcohol abuse   Fever  Acute on chronic alcoholic pancreatitis: Likely due to "a lot of greasy food".  Sober for 7 months.  LFT within normal.  Lipase 1700> 385> 417> 132> 59.  MRCP confirms acute on chronic pancreatitis with stable pancreatic cyst.  Significant pain with nausea after soft diet. -Continue full liquid diet.  Did not tolerate soft diet. 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   -Follow-up pre and postcontrast MRI/MRCP in 1 year recommended.  Fever: Had isolated fever to 102.5 the night of 7/25.  Unknown source.  Due to pancreatitis?  He has complex waxing and waning pancreatic cyst since 2021 likely from pancreatitis.  No cardiopulmonary or UTI symptoms.  Has no leukocytosis either. -Continue monitoring -CT abdomen and pelvis with contrast if he spikes another fever   History of alcohol abuse: Reportedly  sober for 7 months.  Congratulated.  Essential hypertension: Normotensive. -Continue home amlodipine.  Body mass index is 29.57 kg/m.           DVT prophylaxis:  Place and maintain sequential compression device Start: 07/04/22 0916 Patient refused Lovenox and prefers to ambulate and use SCD when he is in bed  Code Status: Full code Family Communication:  None Level of care: Med-Surg Status is: Inpatient The patient will remain inpatient because: Due to acute on chronic pancreatitis and fever   Final disposition: Likely home in the next 24 to 48 hours Consultants:  None  Sch Meds:  Scheduled Meds:  amLODipine  5 mg Oral Daily    Continuous Infusions:  lactated ringers 125 mL/hr at 07/06/22 2112   PRN Meds:.acetaminophen **OR** acetaminophen, HYDROmorphone (DILAUDID) injection, ondansetron **OR** ondansetron (ZOFRAN) IV, oxyCODONE  Antimicrobials: Anti-infectives (From admission, onward)    None        I have personally reviewed the following labs and images: CBC: Recent Labs  Lab 07/03/22 1445 07/04/22 0438 07/06/22 0441 07/07/22 0457  WBC 4.8 5.9 6.5 6.2  NEUTROABS 2.4  --  3.9 2.8  HGB 15.1 14.1 13.2 12.7*  HCT 44.6 42.6 39.2 37.7*  MCV 85.8 87.8 88.1 87.1  PLT 201 190 155 158   BMP &GFR Recent Labs  Lab 07/03/22 1445 07/04/22 0438 07/05/22 0417 07/06/22 0441  NA 138 139 141 138  K 4.1 4.2 3.6 3.7  CL 106 106 106 104  CO2 '26 25 28 25  '$ GLUCOSE 95 106* 99 115*  BUN '13 12 6 6  '$ CREATININE 0.94 0.88 0.91 0.91  CALCIUM 9.0 8.9 9.0 9.0  MG  --   --  1.8 1.7  PHOS  --   --  2.4* 3.5   Estimated Creatinine Clearance: 116.3 mL/min (by C-G formula based on SCr of 0.91 mg/dL). Liver & Pancreas: Recent Labs  Lab 07/03/22 1445 07/04/22 0438 07/05/22 0417 07/06/22 0441  AST '27 21 19 16  '$ ALT '24 20 19 18  '$ ALKPHOS 81 72 72 69  BILITOT 0.8 0.4 0.8 1.0  PROT 7.4 7.0 7.1 6.6  ALBUMIN 3.8 3.5 3.6 3.2*   Recent Labs  Lab 07/03/22 1445 07/04/22 0715 07/05/22 0417 07/06/22 0441 07/07/22 0457  LIPASE 1,672* 385* 417* 132* 59*   No results for input(s): "AMMONIA" in the last 168 hours. Diabetic: No results for input(s): "HGBA1C" in the last 72 hours. No results for input(s): "GLUCAP" in the last 168 hours. Cardiac Enzymes: No results for input(s): "CKTOTAL", "CKMB", "CKMBINDEX", "TROPONINI" in the last  168 hours. No results for input(s): "PROBNP" in the last 8760 hours. Coagulation Profile: No results for input(s): "INR", "PROTIME" in the last 168 hours. Thyroid Function Tests: No results for input(s): "TSH", "T4TOTAL", "FREET4", "T3FREE", "THYROIDAB" in the last 72 hours. Lipid Profile: No results for input(s): "CHOL", "HDL", "LDLCALC", "TRIG", "CHOLHDL", "LDLDIRECT" in the last 72 hours. Anemia Panel: No results for input(s): "VITAMINB12", "FOLATE", "FERRITIN", "TIBC", "IRON", "RETICCTPCT" in the last 72 hours. Urine analysis:    Component Value Date/Time   COLORURINE YELLOW 07/04/2022 0342   APPEARANCEUR HAZY (A) 07/04/2022 0342   LABSPEC 1.026 07/04/2022 0342   PHURINE 6.0 07/04/2022 0342   GLUCOSEU NEGATIVE 07/04/2022 0342   HGBUR NEGATIVE 07/04/2022 0342   BILIRUBINUR NEGATIVE 07/04/2022 0342   KETONESUR NEGATIVE 07/04/2022 0342   PROTEINUR NEGATIVE 07/04/2022 0342   UROBILINOGEN 0.2 10/27/2011 0957   NITRITE NEGATIVE 07/04/2022 0342   LEUKOCYTESUR NEGATIVE 07/04/2022 0342  Sepsis Labs: Invalid input(s): "PROCALCITONIN", "LACTICIDVEN"  Microbiology: No results found for this or any previous visit (from the past 240 hour(s)).  Radiology Studies: No results found.    Effa Yarrow T. [place]  If 7PM-7AM, please contact night-coverage [email or web address] 07/07/2022, 2:20 PM

## 2022-07-08 DIAGNOSIS — E663 Overweight: Secondary | ICD-10-CM

## 2022-07-08 DIAGNOSIS — E876 Hypokalemia: Secondary | ICD-10-CM

## 2022-07-08 DIAGNOSIS — F1011 Alcohol abuse, in remission: Secondary | ICD-10-CM | POA: Diagnosis not present

## 2022-07-08 DIAGNOSIS — K852 Alcohol induced acute pancreatitis without necrosis or infection: Secondary | ICD-10-CM | POA: Diagnosis not present

## 2022-07-08 DIAGNOSIS — R509 Fever, unspecified: Secondary | ICD-10-CM | POA: Diagnosis not present

## 2022-07-08 DIAGNOSIS — I10 Essential (primary) hypertension: Secondary | ICD-10-CM | POA: Diagnosis not present

## 2022-07-08 LAB — COMPREHENSIVE METABOLIC PANEL
ALT: 21 U/L (ref 0–44)
AST: 25 U/L (ref 15–41)
Albumin: 3 g/dL — ABNORMAL LOW (ref 3.5–5.0)
Alkaline Phosphatase: 78 U/L (ref 38–126)
Anion gap: 6 (ref 5–15)
BUN: 7 mg/dL (ref 6–20)
CO2: 27 mmol/L (ref 22–32)
Calcium: 8.6 mg/dL — ABNORMAL LOW (ref 8.9–10.3)
Chloride: 108 mmol/L (ref 98–111)
Creatinine, Ser: 0.88 mg/dL (ref 0.61–1.24)
GFR, Estimated: >60 mL/min (ref >60)
Glucose, Bld: 113 mg/dL — ABNORMAL HIGH (ref 70–99)
Potassium: 3.4 mmol/L — ABNORMAL LOW (ref 3.5–5.1)
Sodium: 141 mmol/L (ref 135–145)
Total Bilirubin: 0.3 mg/dL (ref 0.3–1.2)
Total Protein: 6.6 g/dL (ref 6.5–8.1)

## 2022-07-08 LAB — CBC
HCT: 36.6 % — ABNORMAL LOW (ref 39.0–52.0)
Hemoglobin: 12.3 g/dL — ABNORMAL LOW (ref 13.0–17.0)
MCH: 29.1 pg (ref 26.0–34.0)
MCHC: 33.6 g/dL (ref 30.0–36.0)
MCV: 86.7 fL (ref 80.0–100.0)
Platelets: 188 10*3/uL (ref 150–400)
RBC: 4.22 MIL/uL (ref 4.22–5.81)
RDW: 12.4 % (ref 11.5–15.5)
WBC: 5.3 10*3/uL (ref 4.0–10.5)
nRBC: 0 % (ref 0.0–0.2)

## 2022-07-08 LAB — LIPASE, BLOOD: Lipase: 52 U/L — ABNORMAL HIGH (ref 11–51)

## 2022-07-08 LAB — PHOSPHORUS: Phosphorus: 3.4 mg/dL (ref 2.5–4.6)

## 2022-07-08 LAB — MAGNESIUM: Magnesium: 2 mg/dL (ref 1.7–2.4)

## 2022-07-08 MED ORDER — POTASSIUM CHLORIDE CRYS ER 20 MEQ PO TBCR
40.0000 meq | EXTENDED_RELEASE_TABLET | Freq: Once | ORAL | Status: AC
  Administered 2022-07-08: 40 meq via ORAL
  Filled 2022-07-08: qty 2

## 2022-07-08 MED ORDER — ONDANSETRON HCL 4 MG PO TABS: 4.0000 mg | ORAL_TABLET | Freq: Four times a day (QID) | ORAL | 0 refills | Status: AC | PRN

## 2022-07-08 MED ORDER — OXYCODONE HCL 5 MG PO TABS
5.0000 mg | ORAL_TABLET | Freq: Four times a day (QID) | ORAL | 0 refills | Status: AC | PRN
Start: 2022-07-08 — End: 2022-07-11

## 2022-07-08 MED ORDER — SENNOSIDES-DOCUSATE SODIUM 8.6-50 MG PO TABS: 1.0000 | ORAL_TABLET | Freq: Two times a day (BID) | ORAL | 0 refills | Status: DC | PRN

## 2022-07-08 NOTE — Discharge Summary (Signed)
Physician Discharge Summary  Max Ayers OZH:[id number] DOB: 02/28/1971 DOA: 07/03/2022  PCP: Willeen Niece, PA  Admit date: 07/03/2022 Discharge date: 07/08/2022 Admitted From: Home Disposition: Home Recommendations for Outpatient Follow-up:  Follow up with PCP in 1 week Check CMP and CBC in 1 week Follow-up pre and postcontrast MRI/MRCP in 1 year for complex pancreatic cyst Please follow up on the following pending results: None  Home Health: Not indicated Equipment/Devices: Not indicated  Discharge Condition: Stable CODE STATUS: Full code  Follow-up Information     Willeen Niece, [place]. Schedule an appointment as soon as possible for a visit in 1 week(s).   Specialty: Physician Assistant Contact information: [place] [phone] [facility] 51 year old M with history of alcoholic pancreatitis, complex pancreatic cyst, prior EtOH abuse and HTN presenting with acute epigastric pain and 1 episode of emesis after eating "a lot of junk food", and admitted for acute on chronic pancreatitis.  Reportedly sober since January 2023.  Lipase elevated to 1672.  CMP, CBC and UA without significant finding.  He was admitted and started on IV fluid and analgesics.  MRCP ordered.   The next day, MRCP showed acute on chronic pancreatitis with stable complex pancreatic cyst.  Symptoms and lipase improved with IV fluid and pain medication.  He tolerated full liquid diet, felt well and ready to go home and advance his diet at home.   See individual problem list below for more.   Problems addressed during this hospitalization Principal Problem:   Acute alcoholic pancreatitis Active Problems:   Pancreatic cyst   Benign essential HTN   History of alcohol abuse   Fever   Acute on chronic alcoholic pancreatitis: Likely due to "a lot of greasy food".  Sober for 7 months.  LFT within normal.  Lipase 1700> 385>  417> 132> 52.  MRCP confirms acute on chronic pancreatitis with stable pancreatic cyst.  Tolerated full liquid diet without problem.  Discharged home on full liquid diet.  Patient to advance his diet with pain as his guide.  Advised to avoid greasy foods.  Gave Rx for p.o. 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   -Follow-up pre and postcontrast MRI/MRCP in 1 year recommended.   Fever: Isolated fever to 102.5 the night of 7/25.  Unknown source.  Due to pancreatitis?  He has complex waxing and waning pancreatic cyst since 2021 likely from pancreatitis.  No cardiopulmonary or UTI symptoms.  Has no leukocytosis either.   History of alcohol abuse: Reportedly sober for 7 months.  Congratulated.   Essential hypertension: Normotensive. -Continue home amlodipine.  Hypokalemia: K3.4.  Replenished prior to discharge.   Overweight Body mass index is 29.57 kg/m.           Vital signs Vitals:   07/07/22 0633 07/07/22 1419 07/07/22 2129 07/08/22 0518  BP: 118/71 122/76 134/70 119/72  Pulse: 69 63 70 64  Temp: 98.6 F (37 C) 98.1 F (36.7 C) 98.4 F (36.9 C) 98.2 F (36.8  C)  Resp: '18  18 18  '$ Height:      Weight:      SpO2: 98% 99% 98% 96%  TempSrc: Oral Oral Oral Oral  BMI (Calculated):         Discharge exam  GENERAL: No apparent distress.  Nontoxic. HEENT: MMM.  Vision and hearing grossly intact.  NECK: Supple.  No apparent JVD.  RESP:  No IWOB.  Fair aeration bilaterally. CVS:  RRR. Heart sounds normal.  ABD/GI/GU: BS+. Abd soft, NTND.  MSK/EXT:  Moves  extremities. No apparent deformity. No edema.  SKIN: no apparent skin lesion or wound NEURO: Awake and alert. Oriented appropriately.  No apparent focal neuro deficit. PSYCH: Calm. Normal affect.   Discharge Instructions Discharge Instructions     Call MD for:  difficulty breathing, headache or visual disturbances   Complete by: As directed    Call MD for:  extreme fatigue   Complete by: As directed    Call MD for:  persistant nausea and vomiting   Complete by: As directed    Call MD for:  severe uncontrolled pain   Complete by: As directed    Diet - low sodium heart healthy   Complete by: As directed    Soft bland diet   Discharge instructions   Complete by: As directed    It has been a pleasure taking care of you!  You were hospitalized due to acute pancreatitis.  Your symptoms improved.  Would recommend continuing full liqujid diet and slowly go to soft bland diet over the next 3 to 4 days.  Avoid heavy greasy diet and alcohol.  Follow-up with your primary care doctor in 1 to 2 weeks or sooner if needed.  You may need repeat MRI in 1 year to monitor pancreatic cyst.  Please take your blood pressure medication daily as prescribed.  Avoid over-the-counter pain medication other than plain Tylenol.  Be aware that Oxycodone is addictive and can cause problems including drowsiness, sedation, constipation, impaired judgment, respiratory depression that could potentially lead to death and impaired balance that could increase risk of fall.  We do not recommend driving, operating machinery or other activity that requires similar mental and physical engagement.     Take care,   Increase activity slowly   Complete by: As directed       Allergies as of 07/08/2022       Reactions   Yellow Jacket Venom [bee Venom] Anaphylaxis        Medication List     STOP taking these medications    ibuprofen 200 MG tablet Commonly known as: ADVIL       TAKE these medications     amLODipine 5 MG tablet Commonly known as: NORVASC Take 1 tablet (5 mg total) by mouth daily. What changed: when to take this   CLEAR EYES OP Place 1 drop into both eyes 3 (three) times daily as needed (dry eyes).   ondansetron 4 MG tablet Commonly known as: Zofran Take 1 tablet (4 mg total) by mouth every 6 (six) hours as needed for up to 5 days for nausea or vomiting.   oxyCODONE 5 MG immediate release tablet Commonly known as: Oxy IR/ROXICODONE Take 1 tablet (5 mg total) by mouth every 6 (six) hours as needed for up to 3 days for moderate pain.   senna-docusate 8.6-50 MG tablet Commonly known as: Senokot-S Take 1 tablet by mouth 2 (two) times daily between meals as needed for moderate constipation.  Consultations: None  Procedures/Studies:   MR ABDOMEN MRCP W WO CONTAST  Result Date: 07/04/2022 CLINICAL DATA:  No acute abnormality. EXAM: MRI ABDOMEN WITHOUT AND WITH CONTRAST (INCLUDING MRCP) TECHNIQUE: Multiplanar multisequence MR imaging of the abdomen was performed both before and after the administration of intravenous contrast. Heavily T2-weighted images of the biliary and pancreatic ducts were obtained, and three-dimensional MRCP images were rendered by post processing. CONTRAST:  74m GADAVIST GADOBUTROL 1 MMOL/ML IV SOLN COMPARISON:  Ultrasound June 02, 2022 and MRI abdomen December 23, 2021. FINDINGS: Lower chest: Heterogeneous signal in the bilateral lung bases commonly reflects atelectasis. Hepatobiliary: No significant hepatic steatosis. No suspicious hepatic lesion. Periportal edema. Gallbladder is unremarkable. No biliary ductal dilation, the common bile duct measures 4 mm in diameter. Pancreas: Reduced intrinsic T1 signal of the pancreatic head with peripancreatic edema. Similar size of the nonenhancing T2 hyperintense well-circumscribed lesion in the pancreatic head which again demonstrates internal complexity measuring 16 x 15 x 13 mm on images 23/16 and  13/9 previously measuring 17 x 16 x 14 mm when remeasured for consistency, but no suspicious postcontrast enhancement. Similar prominence of the pancreatic duct without discrete pancreatic ductal dilation. Spleen:  No splenomegaly or focal splenic lesion. Adrenals/Urinary Tract: Bilateral adrenal glands appear normal. No hydronephrosis. No solid enhancing renal mass. Stomach/Bowel: Mild periampullary duodenal inflammation. Colonic diverticulosis without findings of acute diverticulitis. Vascular/Lymphatic: No pathologically enlarged lymph nodes identified. No abdominal aortic aneurysm demonstrated. Other:  No significant abdominal free fluid. Musculoskeletal: No suspicious bone lesions identified. IMPRESSION: 1. Acute on chronic pancreatitis. 2. 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 Recommend follow up pre and post contrast MRI/MRCP in 1 year. This recommendation follows ACR consensus guidelines: Management of Incidental Pancreatic Cysts: A White Paper of the ACR Incidental Findings Committee. J[place] [id number];78:938-101 3. No biliary ductal dilation or choledocholithiasis. 4. Reactive duodenitis. Electronically Signed   By: JDahlia BailiffM.D.   On: 07/04/2022 09:46   MR 3D Recon At Scanner  Result Date: 07/04/2022 CLINICAL DATA:  No acute abnormality. EXAM: MRI ABDOMEN WITHOUT AND WITH CONTRAST (INCLUDING MRCP) TECHNIQUE: Multiplanar multisequence MR imaging of the abdomen was performed both before and after the administration of intravenous contrast. Heavily T2-weighted images of the biliary and pancreatic ducts were obtained, and three-dimensional MRCP images were rendered by post processing. CONTRAST:  188mGADAVIST GADOBUTROL 1 MMOL/ML  IV SOLN COMPARISON:  Ultrasound June 02, 2022 and MRI abdomen December 23, 2021. FINDINGS: Lower chest: Heterogeneous signal in the bilateral lung bases commonly reflects atelectasis. Hepatobiliary: No significant hepatic steatosis. No suspicious hepatic lesion. Periportal edema. Gallbladder is unremarkable. No biliary ductal dilation, the common bile duct measures 4 mm in diameter. Pancreas: Reduced intrinsic T1 signal of the pancreatic head with peripancreatic edema. Similar size of the nonenhancing T2 hyperintense well-circumscribed lesion in the pancreatic head which again demonstrates internal complexity measuring 16 x 15 x 13 mm on images 23/16 and 13/9 previously measuring 17 x 16 x 14 mm when remeasured for consistency, but no suspicious postcontrast enhancement. Similar prominence of the pancreatic duct without discrete pancreatic ductal dilation. Spleen:  No splenomegaly or focal splenic lesion. Adrenals/Urinary Tract: Bilateral adrenal glands appear normal. No hydronephrosis. No solid enhancing renal mass. Stomach/Bowel: Mild periampullary duodenal inflammation. Colonic diverticulosis without findings  of acute diverticulitis. Vascular/Lymphatic: No pathologically enlarged lymph nodes identified. No abdominal aortic aneurysm demonstrated. Other:  No significant abdominal free fluid. Musculoskeletal: No suspicious bone lesions identified. IMPRESSION: 1. Acute on chronic pancreatitis. 2. 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 Recommend follow up pre and post contrast MRI/MRCP in 1 year. This recommendation follows ACR consensus guidelines: Management of Incidental Pancreatic Cysts: A White Paper of the ACR Incidental Findings Committee. [place] 2694;85:462-703. 3. No biliary ductal dilation or choledocholithiasis. 4. Reactive duodenitis. Electronically Signed   By: Dahlia Bailiff M.D.   On: 07/04/2022 09:46       The results of significant diagnostics from this hospitalization (including imaging, microbiology, ancillary and laboratory) are listed below for reference.     Microbiology: No results found for this or any previous visit (from the past 240 hour(s)).   Labs:  CBC: Recent Labs  Lab 07/03/22 1445 07/04/22 0438 07/06/22 0441 07/07/22 0457 07/08/22 0450  WBC 4.8 5.9 6.5 6.2 5.3  NEUTROABS 2.4  --  3.9 2.8  --   HGB 15.1 14.1 13.2 12.7* 12.3*  HCT 44.6 42.6 39.2 37.7* 36.6*  MCV 85.8 87.8 88.1 87.1 86.7  PLT 201 190 155 158 188   BMP &GFR Recent Labs  Lab 07/03/22 1445 07/04/22 0438 07/05/22 0417 07/06/22 0441 07/08/22 0450  NA 138 139 141 138 141  K 4.1 4.2 3.6 3.7 3.4*  CL 106 106 106 104 108  CO2 '26 25 28 25 27  '$ GLUCOSE 95 106* 99 115* 113*  BUN '13 12 6 6 7  '$ CREATININE 0.94 0.88 0.91 0.91 0.88  CALCIUM 9.0 8.9 9.0 9.0 8.6*  MG  --   --  1.8 1.7 2.0  PHOS  --   --  2.4* 3.5 3.4   Estimated Creatinine Clearance: 120.2 mL/min (by C-G formula based on SCr of 0.88 mg/dL). Liver & Pancreas: Recent Labs  Lab 07/03/22 1445 07/04/22 0438 07/05/22 0417 07/06/22 0441 07/08/22 0450  AST '27 21 19 16 25  '$ ALT '24 20 19 18 21  '$ ALKPHOS 81 72 72 69 78  BILITOT 0.8 0.4 0.8 1.0 0.3  PROT 7.4 7.0 7.1 6.6 6.6  ALBUMIN 3.8 3.5 3.6 3.2* 3.0*   Recent Labs  Lab 07/04/22 0715 07/05/22 0417 07/06/22 0441 07/07/22 0457 07/08/22 0450  LIPASE 385* 417* 132* 59* 52*   No results for input(s): "AMMONIA" in the last 168 hours. Diabetic: No results for input(s): "HGBA1C" in the last 72 hours. No results for input(s): "GLUCAP" in the last 168 hours. Cardiac Enzymes: No results for input(s): "CKTOTAL", "CKMB", "CKMBINDEX", "TROPONINI" in the last 168 hours. No results for input(s): "PROBNP" in  the last 8760 hours. Coagulation Profile: No results for input(s): "INR", "PROTIME" in the last 168 hours. Thyroid Function Tests: No results for input(s): "TSH", "T4TOTAL", "FREET4", "T3FREE", "THYROIDAB" in the last 72 hours. Lipid Profile: No results for input(s): "CHOL", "HDL", "LDLCALC", "TRIG", "CHOLHDL", "LDLDIRECT" in the last 72 hours. Anemia Panel: No results for input(s): "VITAMINB12", "FOLATE", "FERRITIN", "TIBC", "IRON", "RETICCTPCT" in the last 72 hours. Urine analysis:    Component Value Date/Time   COLORURINE YELLOW 07/04/2022 0342   APPEARANCEUR HAZY (A) 07/04/2022 0342   LABSPEC 1.026 07/04/2022 0342   PHURINE 6.0 07/04/2022 0342  GLUCOSEU NEGATIVE 07/04/2022 [place] 07/04/2022 0342   BILIRUBINUR NEGATIVE 07/04/2022 0342   KETONESUR NEGATIVE 07/04/2022 0342   PROTEINUR NEGATIVE 07/04/2022 0342   UROBILINOGEN 0.2 10/27/2011 0957   NITRITE NEGATIVE 07/04/2022 0342   LEUKOCYTESUR NEGATIVE 07/04/2022 0342   Sepsis Labs: Invalid input(s): "PROCALCITONIN", "LACTICIDVEN"   SIGNED:  Mercy Riding, MD  [facility] 07/08/2022, 3:27 PM

## 2022-07-08 NOTE — Progress Notes (Signed)
Discharge instructions discussed with patient, verbalized agreement and understanding 

## 2022-07-27 ENCOUNTER — Encounter (HOSPITAL_COMMUNITY): Payer: Self-pay

## 2022-07-27 ENCOUNTER — Emergency Department (HOSPITAL_COMMUNITY)
Admission: EM | Admit: 2022-07-27 | Discharge: 2022-07-27 | Discharge status: Against Medical Advice | Payer: Medicaid Other | Attending: Emergency Medicine | Admitting: Emergency Medicine

## 2022-07-27 ENCOUNTER — Other Ambulatory Visit: Payer: Self-pay

## 2022-07-27 DIAGNOSIS — Z5321 Procedure and treatment not carried out due to patient leaving prior to being seen by health care provider: Secondary | ICD-10-CM | POA: Diagnosis not present

## 2022-07-27 DIAGNOSIS — R110 Nausea: Secondary | ICD-10-CM | POA: Insufficient documentation

## 2022-07-27 DIAGNOSIS — R109 Unspecified abdominal pain: Secondary | ICD-10-CM | POA: Diagnosis not present

## 2022-07-27 LAB — CBC WITH DIFFERENTIAL/PLATELET
Abs Immature Granulocytes: 0 10*3/uL (ref 0.00–0.07)
Basophils Absolute: 0 10*3/uL (ref 0.0–0.1)
Basophils Relative: 0 %
Eosinophils Absolute: 0.1 10*3/uL (ref 0.0–0.5)
Eosinophils Relative: 3 %
HCT: 44 % (ref 39.0–52.0)
Hemoglobin: 14.5 g/dL (ref 13.0–17.0)
Immature Granulocytes: 0 %
Lymphocytes Relative: 51 %
Lymphs Abs: 2.8 10*3/uL (ref 0.7–4.0)
MCH: 28.9 pg (ref 26.0–34.0)
MCHC: 33 g/dL (ref 30.0–36.0)
MCV: 87.8 fL (ref 80.0–100.0)
Monocytes Absolute: 0.5 10*3/uL (ref 0.1–1.0)
Monocytes Relative: 10 %
Neutro Abs: 1.9 10*3/uL (ref 1.7–7.7)
Neutrophils Relative %: 36 %
Platelets: 227 10*3/uL (ref 150–400)
RBC: 5.01 MIL/uL (ref 4.22–5.81)
RDW: 13 % (ref 11.5–15.5)
WBC: 5.4 10*3/uL (ref 4.0–10.5)
nRBC: 0 % (ref 0.0–0.2)

## 2022-07-27 LAB — COMPREHENSIVE METABOLIC PANEL
ALT: 21 U/L (ref 0–44)
AST: 21 U/L (ref 15–41)
Albumin: 4.3 g/dL (ref 3.5–5.0)
Alkaline Phosphatase: 81 U/L (ref 38–126)
Anion gap: 8 (ref 5–15)
BUN: 19 mg/dL (ref 6–20)
CO2: 27 mmol/L (ref 22–32)
Calcium: 9.5 mg/dL (ref 8.9–10.3)
Chloride: 106 mmol/L (ref 98–111)
Creatinine, Ser: 1.14 mg/dL (ref 0.61–1.24)
GFR, Estimated: >60 mL/min (ref >60)
Glucose, Bld: 77 mg/dL (ref 70–99)
Potassium: 3.9 mmol/L (ref 3.5–5.1)
Sodium: 141 mmol/L (ref 135–145)
Total Bilirubin: 0.5 mg/dL (ref 0.3–1.2)
Total Protein: 8.3 g/dL — ABNORMAL HIGH (ref 6.5–8.1)

## 2022-07-27 LAB — LIPASE, BLOOD: Lipase: 159 U/L — ABNORMAL HIGH (ref 11–51)

## 2022-07-27 NOTE — ED Notes (Signed)
Pt given specimen cup and instructed to try to provide urine sample.

## 2022-07-27 NOTE — ED Triage Notes (Signed)
Pt c/o abdominal pain and nausea since this morning. Hx of pancreatitis.

## 2022-07-27 NOTE — ED Notes (Signed)
Pt turned in labels and advised registration staff that he is leaving. Pt seen exiting ED lobby. Triage RN aware.

## 2022-07-27 NOTE — ED Provider Triage Note (Signed)
Emergency Medicine Provider Triage Evaluation Note  Max Ayers , a 51 y.o. male  was evaluated in triage.  Pt complains of abdominal pain.  Patient has a history of pancreatitis.  He states that this feels similar to prior episodes.  He was recently mated at the end of July for acute on chronic pancreatitis.  He has a history of alcohol induced pancreatitis but states he has not consumed alcohol in the past 3 to 4 months.  Symptoms again this morning upon awakening.  They have been persistent since onset.  He notes increase of symptoms with eating.  Denies hematochezia, melena, nausea, vomiting, fever, chest pain, shortness of breath.  Review of Systems  Positive: See abov Negative:   Physical Exam  BP (!) 149/94   Pulse 82   Temp 98.2 F (36.8 C) (Oral)   Resp 18   Ht '5\' 11"'$  (1.803 m)   Wt 91.2 kg   SpO2 98%   BMI 28.03 kg/m  Gen:   Awake, no distress   Resp:  Normal effort  MSK:   Moves extremities without difficulty  Other:  Epigastric tenderness to palpation  Medical Decision Making  Medically screening exam initiated at 6:23 PM.  Appropriate orders placed.  Jamicah Anstead was informed that the remainder of the evaluation will be completed by another provider, this initial triage assessment does not replace that evaluation, and the importance of remaining in the ED until their evaluation is complete.     Wilnette Kales, [place] 07/27/22 1824

## 2022-07-28 ENCOUNTER — Encounter (HOSPITAL_BASED_OUTPATIENT_CLINIC_OR_DEPARTMENT_OTHER): Payer: Self-pay | Admitting: Emergency Medicine

## 2022-07-28 ENCOUNTER — Emergency Department (HOSPITAL_BASED_OUTPATIENT_CLINIC_OR_DEPARTMENT_OTHER)
Admission: EM | Admit: 2022-07-28 | Discharge: 2022-07-28 | Disposition: A | Discharge status: Home / Self Care | Payer: Medicaid Other | Attending: Emergency Medicine | Admitting: Emergency Medicine

## 2022-07-28 DIAGNOSIS — R110 Nausea: Secondary | ICD-10-CM | POA: Diagnosis not present

## 2022-07-28 DIAGNOSIS — K859 Acute pancreatitis without necrosis or infection, unspecified: Secondary | ICD-10-CM

## 2022-07-28 DIAGNOSIS — I10 Essential (primary) hypertension: Secondary | ICD-10-CM | POA: Insufficient documentation

## 2022-07-28 DIAGNOSIS — Z79899 Other long term (current) drug therapy: Secondary | ICD-10-CM | POA: Insufficient documentation

## 2022-07-28 DIAGNOSIS — R1013 Epigastric pain: Secondary | ICD-10-CM | POA: Insufficient documentation

## 2022-07-28 MED ORDER — OXYCODONE-ACETAMINOPHEN 5-325 MG PO TABS: 1.0000 | ORAL_TABLET | Freq: Four times a day (QID) | ORAL | 0 refills | Status: DC | PRN

## 2022-07-28 MED ORDER — MORPHINE SULFATE (PF) 4 MG/ML IV SOLN
4.0000 mg | Freq: Once | INTRAVENOUS | Status: AC
  Administered 2022-07-28: 4 mg via INTRAVENOUS
  Filled 2022-07-28: qty 1

## 2022-07-28 MED ORDER — ONDANSETRON HCL 4 MG/2ML IJ SOLN
4.0000 mg | Freq: Once | INTRAMUSCULAR | Status: AC
Start: 2022-07-28 — End: 2022-07-28
  Administered 2022-07-28: 4 mg via INTRAVENOUS
  Filled 2022-07-28: qty 2

## 2022-07-28 MED ORDER — SODIUM CHLORIDE 0.9 % IV BOLUS
1000.0000 mL | Freq: Once | INTRAVENOUS | Status: AC
Start: 2022-07-28 — End: 2022-07-28
  Administered 2022-07-28: 1000 mL via INTRAVENOUS

## 2022-07-28 MED ORDER — HYDROMORPHONE HCL 1 MG/ML IJ SOLN
1.0000 mg | Freq: Once | INTRAMUSCULAR | Status: AC
  Administered 2022-07-28: 1 mg via INTRAVENOUS
  Filled 2022-07-28: qty 1

## 2022-07-28 NOTE — ED Triage Notes (Signed)
Pt is /co abd pain that started this morning  Pt has nausea without vomiting  Pt last BM was Monday

## 2022-07-28 NOTE — Discharge Instructions (Signed)
Begin taking Percocet as prescribed as needed for pain.  Clear liquid diet for the next 24 hours, then slowly advance as normal.

## 2022-07-28 NOTE — ED Provider Notes (Signed)
[facility] EMERGENCY DEPARTMENT Provider Note   CSN: [id number] Arrival date & time: 07/28/22  0030     History  Chief Complaint  Patient presents with   Abdominal Pain    Max Ayers is a 51 y.o. male.  Patient is a 51 year old male with past medical history of alcohol induced pancreatitis with frequent flareups, hypertension, hyperlipidemia.  Patient presenting today with complaints of epigastric pain and nausea.  Symptoms started this morning.  He denies having any fevers or chills.  He denies any diarrhea or feeling constipated, but reports has not had a bowel movement in 2 days.  Symptoms feel similar to his prior pancreatitis.  In the past, he has been prescribed Percocet which he no longer has.  The history is provided by the patient.       Home Medications Prior to Admission medications   Medication Sig Start Date End Date Taking? Authorizing Provider  amLODipine (NORVASC) 5 MG tablet Take 1 tablet (5 mg total) by mouth daily. Patient taking differently: Take 5 mg by mouth 2 (two) times a week. 06/05/22 07/05/22  Darliss Cheney, MD  Naphazoline HCl (CLEAR EYES OP) Place 1 drop into both eyes 3 (three) times daily as needed (dry eyes).    [provider]  senna-docusate (SENOKOT-S) 8.6-50 MG tablet Take 1 tablet by mouth 2 (two) times daily between meals as needed for moderate constipation. 07/08/22   Mercy Riding, MD      Allergies    Yellow jacket venom [bee venom]    Review of Systems   Review of Systems  All other systems reviewed and are negative.   Physical Exam Updated Vital Signs BP (!) 157/96 (BP Location: Left Arm)   Pulse 84   Temp 98.1 F (36.7 C) (Oral)   Resp 16   Ht '5\' 11"'$  (1.803 m)   Wt 92.1 kg   SpO2 98%   BMI 28.31 kg/m  Physical Exam Vitals and nursing note reviewed.  Constitutional:      General: He is not in acute distress.    Appearance: He is well-developed. He is not diaphoretic.  HENT:     Head: Normocephalic  and atraumatic.  Cardiovascular:     Rate and Rhythm: Normal rate and regular rhythm.     Heart sounds: No murmur heard.    No friction rub.  Pulmonary:     Effort: Pulmonary effort is normal. No respiratory distress.     Breath sounds: Normal breath sounds. No wheezing or rales.  Abdominal:     General: Bowel sounds are normal. There is no distension.     Palpations: Abdomen is soft.     Tenderness: There is abdominal tenderness in the epigastric area. There is no right CVA tenderness, left CVA tenderness, guarding or rebound.  Musculoskeletal:        General: Normal range of motion.     Cervical back: Normal range of motion and neck supple.  Skin:    General: Skin is warm and dry.  Neurological:     Mental Status: He is alert and oriented to person, place, and time.     Coordination: Coordination normal.     ED Results / Procedures / Treatments   Labs (all labs ordered are listed, but only abnormal results are displayed) Labs Reviewed - No data to display  EKG None  Radiology No results found.  Procedures Procedures    Medications Ordered in ED Medications  sodium chloride 0.9 % bolus 1,000  mL (has no administration in time range)  morphine (PF) 4 MG/ML injection 4 mg (has no administration in time range)  ondansetron (ZOFRAN) injection 4 mg (has no administration in time range)    ED Course/ Medical Decision Making/ A&P  Patient is a 51 year old male with past medical history of chronic abdominal pain related to chronic pancreatitis.  He initially presented at [facility] and had laboratory studies obtained there.  He left due to prolonged wait time.  Laboratory studies from [facility] reviewed showing a lipase of 159, but are otherwise unremarkable.  IV line established and patient given normal saline along with morphine, Zofran, and Dilaudid.  He is having some relief and seems appropriate for discharge.  He is nontoxic-appearing and tolerating p.o.  He will  be discharged with Percocet, clear liquids, and as needed return.  Final Clinical Impression(s) / ED Diagnoses Final diagnoses:  None    Rx / DC Orders ED Discharge Orders     None         Veryl Speak, MD 07/28/22 [phone]

## 2022-08-04 ENCOUNTER — Encounter (HOSPITAL_COMMUNITY): Payer: Self-pay

## 2022-08-04 ENCOUNTER — Inpatient Hospital Stay (HOSPITAL_BASED_OUTPATIENT_CLINIC_OR_DEPARTMENT_OTHER)
Admission: EM | Admit: 2022-08-04 | Discharge: 2022-08-07 | DRG: 439 | Disposition: A | Discharge status: Home / Self Care | Payer: Medicaid Other | Attending: Family Medicine | Admitting: Family Medicine

## 2022-08-04 ENCOUNTER — Other Ambulatory Visit: Payer: Self-pay

## 2022-08-04 ENCOUNTER — Encounter (HOSPITAL_BASED_OUTPATIENT_CLINIC_OR_DEPARTMENT_OTHER): Payer: Self-pay | Admitting: Emergency Medicine

## 2022-08-04 DIAGNOSIS — Z6827 Body mass index (BMI) 27.0-27.9, adult: Secondary | ICD-10-CM

## 2022-08-04 DIAGNOSIS — I10 Essential (primary) hypertension: Secondary | ICD-10-CM | POA: Diagnosis present

## 2022-08-04 DIAGNOSIS — K860 Alcohol-induced chronic pancreatitis: Secondary | ICD-10-CM | POA: Diagnosis present

## 2022-08-04 DIAGNOSIS — I700 Atherosclerosis of aorta: Secondary | ICD-10-CM | POA: Diagnosis present

## 2022-08-04 DIAGNOSIS — Z87891 Personal history of nicotine dependence: Secondary | ICD-10-CM

## 2022-08-04 DIAGNOSIS — Z91030 Bee allergy status: Secondary | ICD-10-CM

## 2022-08-04 DIAGNOSIS — Z79899 Other long term (current) drug therapy: Secondary | ICD-10-CM

## 2022-08-04 DIAGNOSIS — Z8249 Family history of ischemic heart disease and other diseases of the circulatory system: Secondary | ICD-10-CM

## 2022-08-04 DIAGNOSIS — R739 Hyperglycemia, unspecified: Secondary | ICD-10-CM | POA: Diagnosis present

## 2022-08-04 DIAGNOSIS — Z20822 Contact with and (suspected) exposure to covid-19: Secondary | ICD-10-CM | POA: Diagnosis present

## 2022-08-04 DIAGNOSIS — F1011 Alcohol abuse, in remission: Secondary | ICD-10-CM | POA: Diagnosis present

## 2022-08-04 DIAGNOSIS — Z833 Family history of diabetes mellitus: Secondary | ICD-10-CM

## 2022-08-04 DIAGNOSIS — E663 Overweight: Secondary | ICD-10-CM | POA: Diagnosis present

## 2022-08-04 DIAGNOSIS — E785 Hyperlipidemia, unspecified: Secondary | ICD-10-CM | POA: Diagnosis present

## 2022-08-04 DIAGNOSIS — Z91199 Patient's noncompliance with other medical treatment and regimen due to unspecified reason: Secondary | ICD-10-CM

## 2022-08-04 DIAGNOSIS — K859 Acute pancreatitis without necrosis or infection, unspecified: Secondary | ICD-10-CM | POA: Diagnosis present

## 2022-08-04 DIAGNOSIS — K863 Pseudocyst of pancreas: Secondary | ICD-10-CM

## 2022-08-04 DIAGNOSIS — K852 Alcohol induced acute pancreatitis without necrosis or infection: Principal | ICD-10-CM | POA: Diagnosis present

## 2022-08-04 DIAGNOSIS — G8929 Other chronic pain: Secondary | ICD-10-CM | POA: Diagnosis present

## 2022-08-04 DIAGNOSIS — Z803 Family history of malignant neoplasm of breast: Secondary | ICD-10-CM

## 2022-08-04 LAB — URINALYSIS, ROUTINE W REFLEX MICROSCOPIC
Bilirubin Urine: NEGATIVE
Glucose, UA: NEGATIVE mg/dL
Hgb urine dipstick: NEGATIVE
Ketones, ur: NEGATIVE mg/dL
Leukocytes,Ua: NEGATIVE
Nitrite: NEGATIVE
Protein, ur: 30 mg/dL — AB
Specific Gravity, Urine: >=1.03 (ref 1.005–1.030)
pH: 5.5 (ref 5.0–8.0)

## 2022-08-04 LAB — COMPREHENSIVE METABOLIC PANEL
ALT: 24 U/L (ref 0–44)
AST: 21 U/L (ref 15–41)
Albumin: 4 g/dL (ref 3.5–5.0)
Alkaline Phosphatase: 75 U/L (ref 38–126)
Anion gap: 8 (ref 5–15)
BUN: 17 mg/dL (ref 6–20)
CO2: 27 mmol/L (ref 22–32)
Calcium: 9 mg/dL (ref 8.9–10.3)
Chloride: 105 mmol/L (ref 98–111)
Creatinine, Ser: 1.14 mg/dL (ref 0.61–1.24)
GFR, Estimated: >60 mL/min (ref >60)
Glucose, Bld: 122 mg/dL — ABNORMAL HIGH (ref 70–99)
Potassium: 3.8 mmol/L (ref 3.5–5.1)
Sodium: 140 mmol/L (ref 135–145)
Total Bilirubin: 0.4 mg/dL (ref 0.3–1.2)
Total Protein: 7.8 g/dL (ref 6.5–8.1)

## 2022-08-04 LAB — CBC
HCT: 40.6 % (ref 39.0–52.0)
Hemoglobin: 13.6 g/dL (ref 13.0–17.0)
MCH: 28.9 pg (ref 26.0–34.0)
MCHC: 33.5 g/dL (ref 30.0–36.0)
MCV: 86.4 fL (ref 80.0–100.0)
Platelets: 219 10*3/uL (ref 150–400)
RBC: 4.7 MIL/uL (ref 4.22–5.81)
RDW: 13 % (ref 11.5–15.5)
WBC: 5.7 10*3/uL (ref 4.0–10.5)
nRBC: 0 % (ref 0.0–0.2)

## 2022-08-04 LAB — URINALYSIS, MICROSCOPIC (REFLEX): WBC, UA: NONE SEEN WBC/hpf (ref 0–5)

## 2022-08-04 LAB — LIPASE, BLOOD: Lipase: 252 U/L — ABNORMAL HIGH (ref 11–51)

## 2022-08-04 MED ORDER — FENTANYL CITRATE PF 50 MCG/ML IJ SOSY
100.0000 ug | PREFILLED_SYRINGE | Freq: Once | INTRAMUSCULAR | Status: AC
  Administered 2022-08-04: 100 ug via INTRAVENOUS
  Filled 2022-08-04: qty 2

## 2022-08-04 MED ORDER — SODIUM CHLORIDE 0.9 % IV SOLN: INTRAVENOUS | Status: DC

## 2022-08-04 MED ORDER — ONDANSETRON HCL 4 MG/2ML IJ SOLN
4.0000 mg | Freq: Once | INTRAMUSCULAR | Status: AC
  Administered 2022-08-04: 4 mg via INTRAVENOUS
  Filled 2022-08-04: qty 2

## 2022-08-04 NOTE — ED Triage Notes (Signed)
Right upper quad pain radiates to back. Pt was see last week for same. Pt vomited prior to arrival.

## 2022-08-04 NOTE — ED Notes (Signed)
Called [facility] for a hospitalist consult and spoke to [place] '@[id number]'$ 

## 2022-08-04 NOTE — ED Provider Notes (Signed)
[facility] Provider Note   CSN: [id number] Arrival date & time: 08/04/22  2133     History  Chief Complaint  Patient presents with   Abdominal Pain   Emesis    Max Ayers is a 51 y.o. male.  The history is provided by the patient.  Abdominal Pain Pain location:  RUQ Pain quality: cramping   Pain radiates to:  Does not radiate Pain severity:  Severe Onset quality:  Gradual Duration:  2 weeks Timing:  Constant Progression:  Waxing and waning Chronicity:  Recurrent Context: not alcohol use   Relieved by:  Nothing Associated symptoms: nausea and vomiting   Associated symptoms: no chest pain, no constipation, no cough and no fever   Risk factors: not elderly   Patient with pancreatitis who never got better post last visit.  Not really eating with nausea and emesis.      Home Medications Prior to Admission medications   Medication Sig Start Date End Date Taking? Authorizing Provider  amLODipine (NORVASC) 5 MG tablet Take 1 tablet (5 mg total) by mouth daily. Patient taking differently: Take 5 mg by mouth 2 (two) times a week. 06/05/22 07/05/22  Darliss Cheney, MD  Naphazoline HCl (CLEAR EYES OP) Place 1 drop into both eyes 3 (three) times daily as needed (dry eyes).    [provider]  oxyCODONE-acetaminophen (PERCOCET) 5-325 MG tablet Take 1-2 tablets by mouth every 6 (six) hours as needed. 07/28/22   Veryl Speak, MD  senna-docusate (SENOKOT-S) 8.6-50 MG tablet Take 1 tablet by mouth 2 (two) times daily between meals as needed for moderate constipation. 07/08/22   Mercy Riding, MD      Allergies    Yellow jacket venom [bee venom]    Review of Systems   Review of Systems  Constitutional:  Negative for fever.  Eyes:  Negative for redness.  Respiratory:  Negative for cough.   Cardiovascular:  Negative for chest pain.  Gastrointestinal:  Positive for abdominal pain, nausea and vomiting. Negative for constipation.  All other  systems reviewed and are negative.   Physical Exam Updated Vital Signs BP (!) 142/80 (BP Location: Left Arm)   Pulse 65   Temp 98.4 F (36.9 C) (Oral)   Resp 18   Ht '5\' 11"'$  (1.803 m)   Wt 88.9 kg   SpO2 95%   BMI 27.34 kg/m  Physical Exam Vitals and nursing note reviewed.  Constitutional:      General: He is not in acute distress.    Appearance: He is well-developed. He is not diaphoretic.  HENT:     Head: Normocephalic and atraumatic.  Eyes:     Conjunctiva/sclera: Conjunctivae normal.     Pupils: Pupils are equal, round, and reactive to light.  Cardiovascular:     Rate and Rhythm: Normal rate and regular rhythm.  Pulmonary:     Effort: Pulmonary effort is normal.     Breath sounds: Normal breath sounds. No wheezing or rales.  Abdominal:     Palpations: Abdomen is soft.     Tenderness: There is abdominal tenderness. There is no guarding or rebound.  Musculoskeletal:        General: Normal range of motion.     Cervical back: Normal range of motion and neck supple.  Skin:    General: Skin is warm and dry.     Capillary Refill: Capillary refill takes less than 2 seconds.  Neurological:     General: No focal deficit present.  Mental Status: He is alert and oriented to person, place, and time.  Psychiatric:        Mood and Affect: Mood normal.        Behavior: Behavior normal.     ED Results / Procedures / Treatments   Labs (all labs ordered are listed, but only abnormal results are displayed) Results for orders placed or performed during the hospital encounter of 08/04/22  Lipase, blood  Result Value Ref Range   Lipase 252 (H) 11 - 51 U/L  Comprehensive metabolic panel  Result Value Ref Range   Sodium 140 135 - 145 mmol/L   Potassium 3.8 3.5 - 5.1 mmol/L   Chloride 105 98 - 111 mmol/L   CO2 27 22 - 32 mmol/L   Glucose, Bld 122 (H) 70 - 99 mg/dL   BUN 17 6 - 20 mg/dL   Creatinine, Ser 1.14 0.61 - 1.24 mg/dL   Calcium 9.0 8.9 - 10.3 mg/dL   Total Protein  7.8 6.5 - 8.1 g/dL   Albumin 4.0 3.5 - 5.0 g/dL   AST 21 15 - 41 U/L   ALT 24 0 - 44 U/L   Alkaline Phosphatase 75 38 - 126 U/L   Total Bilirubin 0.4 0.3 - 1.2 mg/dL   GFR, Estimated >60 >60 mL/min   Anion gap 8 5 - 15  CBC  Result Value Ref Range   WBC 5.7 4.0 - 10.5 K/uL   RBC 4.70 4.22 - 5.81 MIL/uL   Hemoglobin 13.6 13.0 - 17.0 g/dL   HCT 40.6 39.0 - 52.0 %   MCV 86.4 80.0 - 100.0 fL   MCH 28.9 26.0 - 34.0 pg   MCHC 33.5 30.0 - 36.0 g/dL   RDW 13.0 11.5 - 15.5 %   Platelets 219 150 - 400 K/uL   nRBC 0.0 0.0 - 0.2 %  Urinalysis, Routine w reflex microscopic Urine, Clean Catch  Result Value Ref Range   Color, Urine AMBER (A) YELLOW   APPearance HAZY (A) CLEAR   Specific Gravity, Urine >=1.030 1.005 - 1.030   pH 5.5 5.0 - 8.0   Glucose, UA NEGATIVE NEGATIVE mg/dL   Hgb urine dipstick NEGATIVE NEGATIVE   Bilirubin Urine NEGATIVE NEGATIVE   Ketones, ur NEGATIVE NEGATIVE mg/dL   Protein, ur 30 (A) NEGATIVE mg/dL   Nitrite NEGATIVE NEGATIVE   Leukocytes,Ua NEGATIVE NEGATIVE  Urinalysis, Microscopic (reflex)  Result Value Ref Range   RBC / HPF 0-5 0 - 5 RBC/hpf   WBC, UA NONE SEEN 0 - 5 WBC/hpf   Bacteria, UA RARE (A) NONE SEEN   Squamous Epithelial / LPF 0-5 0 - 5   Mucus PRESENT    No results found.   Radiology No results found.  Procedures Procedures    Medications Ordered in ED Medications  0.9 %  sodium chloride infusion (has no administration in time range)  fentaNYL (SUBLIMAZE) injection 100 mcg (has no administration in time range)  ondansetron (ZOFRAN) injection 4 mg (has no administration in time range)    ED Course/ Medical Decision Making/ A&P                           Medical Decision Making Abdominal pain never really got better after last visit.  With vomiting, hasn't really been able to eat  Amount and/or Complexity of Data Reviewed Labs: ordered.    Details: All labs reviewed: urine without UTI.  Normal white count 5.7, normal hemoglobin  13.6 normal platelet count.  Normal sodium 140, potassium normal 3.8, normal LFTs, elevated lipase 252.    Risk Prescription drug management. Decision regarding hospitalization. Risk Details: The patient appears reasonably stabilized for admission considering the current resources, flow, and capabilities available in the ED at this time, and I doubt any other [facility] requiring further screening and/or treatment in the ED prior to admission.     Final Clinical Impression(s) / ED Diagnoses Final diagnoses:  Alcohol-induced acute pancreatitis, unspecified complication status       Salih Williamson, MD 08/04/22 2326

## 2022-08-05 DIAGNOSIS — Z803 Family history of malignant neoplasm of breast: Secondary | ICD-10-CM | POA: Diagnosis not present

## 2022-08-05 DIAGNOSIS — R739 Hyperglycemia, unspecified: Secondary | ICD-10-CM | POA: Diagnosis not present

## 2022-08-05 DIAGNOSIS — F1011 Alcohol abuse, in remission: Secondary | ICD-10-CM | POA: Diagnosis not present

## 2022-08-05 DIAGNOSIS — K861 Other chronic pancreatitis: Secondary | ICD-10-CM | POA: Diagnosis not present

## 2022-08-05 DIAGNOSIS — I10 Essential (primary) hypertension: Secondary | ICD-10-CM | POA: Diagnosis not present

## 2022-08-05 DIAGNOSIS — Z87891 Personal history of nicotine dependence: Secondary | ICD-10-CM | POA: Diagnosis not present

## 2022-08-05 DIAGNOSIS — Z91030 Bee allergy status: Secondary | ICD-10-CM | POA: Diagnosis not present

## 2022-08-05 DIAGNOSIS — K859 Acute pancreatitis without necrosis or infection, unspecified: Secondary | ICD-10-CM | POA: Diagnosis present

## 2022-08-05 DIAGNOSIS — K863 Pseudocyst of pancreas: Secondary | ICD-10-CM | POA: Diagnosis not present

## 2022-08-05 DIAGNOSIS — Z91199 Patient's noncompliance with other medical treatment and regimen due to unspecified reason: Secondary | ICD-10-CM | POA: Diagnosis not present

## 2022-08-05 DIAGNOSIS — Z20822 Contact with and (suspected) exposure to covid-19: Secondary | ICD-10-CM | POA: Diagnosis not present

## 2022-08-05 DIAGNOSIS — E663 Overweight: Secondary | ICD-10-CM | POA: Diagnosis not present

## 2022-08-05 DIAGNOSIS — G8929 Other chronic pain: Secondary | ICD-10-CM | POA: Diagnosis not present

## 2022-08-05 DIAGNOSIS — Z79899 Other long term (current) drug therapy: Secondary | ICD-10-CM | POA: Diagnosis not present

## 2022-08-05 DIAGNOSIS — Z8249 Family history of ischemic heart disease and other diseases of the circulatory system: Secondary | ICD-10-CM | POA: Diagnosis not present

## 2022-08-05 DIAGNOSIS — E785 Hyperlipidemia, unspecified: Secondary | ICD-10-CM | POA: Diagnosis not present

## 2022-08-05 DIAGNOSIS — K860 Alcohol-induced chronic pancreatitis: Secondary | ICD-10-CM | POA: Diagnosis not present

## 2022-08-05 DIAGNOSIS — Z833 Family history of diabetes mellitus: Secondary | ICD-10-CM | POA: Diagnosis not present

## 2022-08-05 DIAGNOSIS — I700 Atherosclerosis of aorta: Secondary | ICD-10-CM | POA: Diagnosis not present

## 2022-08-05 DIAGNOSIS — K852 Alcohol induced acute pancreatitis without necrosis or infection: Secondary | ICD-10-CM | POA: Diagnosis not present

## 2022-08-05 DIAGNOSIS — Z6827 Body mass index (BMI) 27.0-27.9, adult: Secondary | ICD-10-CM | POA: Diagnosis not present

## 2022-08-05 LAB — COMPREHENSIVE METABOLIC PANEL
ALT: 19 U/L (ref 0–44)
AST: 16 U/L (ref 15–41)
Albumin: 3.6 g/dL (ref 3.5–5.0)
Alkaline Phosphatase: 72 U/L (ref 38–126)
Anion gap: 4 — ABNORMAL LOW (ref 5–15)
BUN: 11 mg/dL (ref 6–20)
CO2: 29 mmol/L (ref 22–32)
Calcium: 8.9 mg/dL (ref 8.9–10.3)
Chloride: 109 mmol/L (ref 98–111)
Creatinine, Ser: 0.92 mg/dL (ref 0.61–1.24)
GFR, Estimated: >60 mL/min (ref >60)
Glucose, Bld: 92 mg/dL (ref 70–99)
Potassium: 3.8 mmol/L (ref 3.5–5.1)
Sodium: 142 mmol/L (ref 135–145)
Total Bilirubin: 0.5 mg/dL (ref 0.3–1.2)
Total Protein: 6.9 g/dL (ref 6.5–8.1)

## 2022-08-05 LAB — CBC
HCT: 39.6 % (ref 39.0–52.0)
Hemoglobin: 13.1 g/dL (ref 13.0–17.0)
MCH: 29.2 pg (ref 26.0–34.0)
MCHC: 33.1 g/dL (ref 30.0–36.0)
MCV: 88.2 fL (ref 80.0–100.0)
Platelets: 202 10*3/uL (ref 150–400)
RBC: 4.49 MIL/uL (ref 4.22–5.81)
RDW: 13.1 % (ref 11.5–15.5)
WBC: 4.6 10*3/uL (ref 4.0–10.5)
nRBC: 0 % (ref 0.0–0.2)

## 2022-08-05 LAB — MAGNESIUM: Magnesium: 2.1 mg/dL (ref 1.7–2.4)

## 2022-08-05 LAB — LIPASE, BLOOD: Lipase: 126 U/L — ABNORMAL HIGH (ref 11–51)

## 2022-08-05 LAB — PHOSPHORUS: Phosphorus: 2.2 mg/dL — ABNORMAL LOW (ref 2.5–4.6)

## 2022-08-05 MED ORDER — ONDANSETRON HCL 4 MG PO TABS: 4.0000 mg | ORAL_TABLET | Freq: Four times a day (QID) | ORAL | Status: DC | PRN

## 2022-08-05 MED ORDER — MORPHINE SULFATE (PF) 4 MG/ML IV SOLN
4.0000 mg | Freq: Once | INTRAVENOUS | Status: AC
  Administered 2022-08-05: 4 mg via INTRAVENOUS
  Filled 2022-08-05: qty 1

## 2022-08-05 MED ORDER — ONDANSETRON HCL 4 MG/2ML IJ SOLN: 4.0000 mg | Freq: Four times a day (QID) | INTRAMUSCULAR | Status: DC | PRN

## 2022-08-05 MED ORDER — LACTATED RINGERS IV BOLUS
1000.0000 mL | Freq: Once | INTRAVENOUS | Status: AC
  Administered 2022-08-05: 1000 mL via INTRAVENOUS

## 2022-08-05 MED ORDER — ACETAMINOPHEN 650 MG RE SUPP: 650.0000 mg | Freq: Four times a day (QID) | RECTAL | Status: DC | PRN

## 2022-08-05 MED ORDER — FENTANYL CITRATE PF 50 MCG/ML IJ SOSY
50.0000 ug | PREFILLED_SYRINGE | Freq: Once | INTRAMUSCULAR | Status: AC
  Administered 2022-08-05: 50 ug via INTRAVENOUS
  Filled 2022-08-05: qty 1

## 2022-08-05 MED ORDER — HYDROMORPHONE HCL 1 MG/ML IJ SOLN: 1.0000 mg | INTRAMUSCULAR | Status: DC | PRN

## 2022-08-05 MED ORDER — ACETAMINOPHEN 325 MG PO TABS: 650.0000 mg | ORAL_TABLET | Freq: Four times a day (QID) | ORAL | Status: DC | PRN

## 2022-08-05 MED ORDER — FENTANYL CITRATE PF 50 MCG/ML IJ SOSY
50.0000 ug | PREFILLED_SYRINGE | INTRAMUSCULAR | Status: DC | PRN
  Administered 2022-08-05 – 2022-08-06 (×3): 50 ug via INTRAVENOUS
  Filled 2022-08-05 (×3): qty 1

## 2022-08-05 MED ORDER — PANTOPRAZOLE SODIUM 40 MG IV SOLR
40.0000 mg | INTRAVENOUS | Status: DC
  Administered 2022-08-05 – 2022-08-06 (×2): 40 mg via INTRAVENOUS
  Filled 2022-08-05 (×2): qty 10

## 2022-08-05 MED ORDER — AMLODIPINE BESYLATE 10 MG PO TABS
5.0000 mg | ORAL_TABLET | Freq: Every day | ORAL | Status: DC
  Administered 2022-08-05 – 2022-08-07 (×3): 5 mg via ORAL
  Filled 2022-08-05 (×3): qty 1

## 2022-08-05 NOTE — ED Notes (Signed)
Phone Report provided to rec RN

## 2022-08-05 NOTE — Progress Notes (Signed)
Plan of Care Note for accepted transfer   Patient: Max Ayers MRN: [id number]   [place]: 08/04/2022  Facility requesting transfer: [facility] ED Requesting Provider: Dr. Randal Buba Reason for transfer: Acute on Chronic Pancreatitis Facility course:   51 year old Male with past medical history of chronic pancreatitis (follows with [facility]) presenting to the emergency department with complaints of right upper quadrant abdominal pain radiating to the back.  Patient had presented to [facility] emergency department 8/17 with the symptoms and was eventually sent home.  Patient now presents again to [facility] emergency department with persisting symptoms.  Upon evaluation in the emergency department lipase found to be 252.  EDP did not feel repeat abdominal imaging was clinically necessary.  Patient received 100 mcg of intravenous fentanyl without significant relief.  Intravenous fluids were initiated.  EDP requesting hospitalization for continued management of acute on chronic pancreatitis.    Plan of care: The patient is accepted for admission to Telemetry unit, at [facility]..    Author: Vernelle Emerald, MD 08/05/2022  Check [email or web address] for on-call coverage.  Nursing staff, Please call [place] number on Amion as soon as patient's arrival, so appropriate admitting provider can evaluate the pt.

## 2022-08-05 NOTE — H&P (Signed)
History and Physical    Patient: Max Ayers LOV:[id number] DOB: 29-Jan-1971 DOA: 08/04/2022 DOS: the patient was seen and examined on 08/05/2022 PCP: Willeen Niece, PA  Patient coming from: Home  Chief Complaint:  Chief Complaint  Patient presents with   Abdominal Pain   Emesis   HPI: Max Ayers is a 51 y.o. male with medical history significant of hypertension, hyperlipidemia, aortic atherosclerosis, overweight, tobacco use, alcohol abuse, chronic abdominal pain due to chronic pancreatitis who is coming to the emergency department due to abdominal pain, nausea and multiple episodes of vomiting for the past 3 days after eating pizza on Tuesday evening.  No diarrhea, melena or hematochezia.  He denied fever, chills, rhinorrhea, sore throat, wheezing or hemoptysis.  No chest pain, palpitations, diaphoresis, PND, orthopnea or pitting edema of the lower extremities. No flank pain, dysuria, frequency or hematuria.  No polyuria, polydipsia, polyphagia or blurred vision.   ED course: Initial vital signs were temperature 98.4 F, pulse 65, respiration 18, BP 142/80 mmHg O2 sat 95% on room air.  The patient received fentanyl 100 mcg IVP followed by fentanyl 50 mcg IVP x2, morphine 4 mg IVP and ondansetron 4 mg IVP.  He was put on NaCl 0.9% at 125 mL/h.  Lab work: Urinalysis was amber, hazy in appearance with proteinuria 30 mg deciliter and rare bacteria microscopic examination.  CBC was normal.  CMP showed a glucose of 122 mg deciliter, but was otherwise normal.  Lipase was 252 units/L.  Review of Systems: As mentioned in the history of present illness. All other systems reviewed and are negative. Past Medical History:  Diagnosis Date   Hypertension    Pancreatitis    Past Surgical History:  Procedure Laterality Date   NO PAST SURGERIES     Social History:  reports that he has quit smoking. His smoking use included cigarettes and cigars. He smoked an average of 1 pack per day. He has never  used smokeless tobacco. He reports that he does not currently use alcohol. He reports that he does not use drugs.  Allergies  Allergen Reactions   Yellow Jacket Venom [Bee Venom] Anaphylaxis    Family History  Problem Relation Age of Onset   Diabetes Mother    Hypertension Father    Breast cancer Maternal Grandmother    Colon cancer Neg Hx    Stomach cancer Neg Hx    Liver disease Neg Hx    Pancreatic cancer Neg Hx    Esophageal cancer Neg Hx    Rectal cancer Neg Hx     Prior to Admission medications   Medication Sig Start Date End Date Taking? Authorizing Provider  amLODipine (NORVASC) 5 MG tablet Take 1 tablet (5 mg total) by mouth daily. Patient taking differently: Take 5 mg by mouth 2 (two) times a week. 06/05/22 07/05/22  Darliss Cheney, MD  Naphazoline HCl (CLEAR EYES OP) Place 1 drop into both eyes 3 (three) times daily as needed (dry eyes).    [provider]  oxyCODONE-acetaminophen (PERCOCET) 5-325 MG tablet Take 1-2 tablets by mouth every 6 (six) hours as needed. 07/28/22   Veryl Speak, MD  senna-docusate (SENOKOT-S) 8.6-50 MG tablet Take 1 tablet by mouth 2 (two) times daily between meals as needed for moderate constipation. 07/08/22   Mercy Riding, MD    Physical Exam: Vitals:   08/05/22 0900 08/05/22 1100 08/05/22 1200 08/05/22 1451  BP: 113/60  107/61 139/72  Pulse: (!) 44  (!) 49 (!) 49  Resp:  $'13  12 16  'k$ Temp:  98 F (36.7 C)  98.5 F (36.9 C)  TempSrc:  Oral  Oral  SpO2: 98%  99% 99%  Weight:      Height:    '5\' 11"'$  (1.803 m)   Physical Exam Vitals and nursing note reviewed.  Constitutional:      General: He is awake. He is not in acute distress.    Appearance: He is not toxic-appearing.  HENT:     Head: Normocephalic.     Nose: No rhinorrhea.     Mouth/Throat:     Mouth: Mucous membranes are dry.  Eyes:     General: No scleral icterus.    Pupils: Pupils are equal, round, and reactive to light.  Neck:     Vascular: No JVD.   Cardiovascular:     Rate and Rhythm: Normal rate and regular rhythm.     Heart sounds: S1 normal and S2 normal.  Pulmonary:     Effort: Pulmonary effort is normal.     Breath sounds: Normal breath sounds.  Abdominal:     General: Bowel sounds are normal. There is no distension.     Palpations: Abdomen is soft.     Tenderness: There is abdominal tenderness in the right upper quadrant, epigastric area and left upper quadrant. There is no right CVA tenderness, left CVA tenderness, guarding or rebound.  Musculoskeletal:     Cervical back: Neck supple.     Right lower leg: No edema.     Left lower leg: No edema.  Skin:    General: Skin is warm and dry.  Neurological:     General: No focal deficit present.     Mental Status: He is alert and oriented to person, place, and time.  Psychiatric:        Mood and Affect: Mood normal.        Behavior: Behavior normal. Behavior is cooperative.   Data Reviewed:  Results are pending, will review when available.  Assessment and Plan: Principal Problem:   Acute on chronic pancreatitis (HCC) Observation/telemetry Continue IV fluids. Keep n.p.o. for now. Analgesics as needed. Antiemetics as needed. Pantoprazole 40 mg IVP daily. Follow CBC, CMP and lipase in AM.  Active Problems:   Benign essential HTN Currently not on medical therapy. Continue pain control. Resume amlodipine 5 mg p.o. daily.    Hyperglycemia In the setting of acute pancreatitis. Monitor glucose level.    HLD (hyperlipidemia)   Aortic atherosclerosis (HCC) Given history of EtOH use and compliance issues Will defer to PCP.    Overweight with a BMI of 27.34 kg/m Continue lifestyle modifications. Follow-up with primary care provider.    Advance Care Planning:   Code Status: Full Code   Consults:   Family Communication:   Severity of Illness: The appropriate patient status for this patient is OBSERVATION. Observation status is judged to be reasonable and  necessary in order to provide the required intensity of service to ensure the patient's safety. The patient's presenting symptoms, physical exam findings, and initial radiographic and laboratory data in the context of their medical condition is felt to place them at decreased risk for further clinical deterioration. Furthermore, it is anticipated that the patient will be medically stable for discharge from the hospital within 2 midnights of admission.   Author: Reubin Milan, MD 08/05/2022 3:30 PM  For on call review [email or web address].   This document was prepared using Dragon voice recognition software and may contain some unintended transcription  errors.

## 2022-08-05 NOTE — ED Notes (Signed)
Phone Handoff Report provided to [employer], spoke with [employer]

## 2022-08-05 NOTE — Progress Notes (Incomplete)
Plan of Care Note for accepted transfer   Patient: Max Ayers MRN: [id number]   [place]: 08/04/2022  Facility requesting transfer: [facility] Requesting Provider: *** Reason for transfer: *** Facility course: ***  Plan of care: The patient is accepted for admission to {Level_of_Care:26774} unit, at {Campus_List:[facility]}..  ***  Author: Vernelle Emerald, MD 08/05/2022  Check [email or web address] for on-call coverage.  Nursing staff, Please call [place] number on Amion as soon as patient's arrival, so appropriate admitting provider can evaluate the pt.

## 2022-08-06 DIAGNOSIS — K859 Acute pancreatitis without necrosis or infection, unspecified: Secondary | ICD-10-CM | POA: Diagnosis present

## 2022-08-06 DIAGNOSIS — Z87891 Personal history of nicotine dependence: Secondary | ICD-10-CM | POA: Diagnosis not present

## 2022-08-06 DIAGNOSIS — Z91199 Patient's noncompliance with other medical treatment and regimen due to unspecified reason: Secondary | ICD-10-CM | POA: Diagnosis not present

## 2022-08-06 DIAGNOSIS — K863 Pseudocyst of pancreas: Secondary | ICD-10-CM

## 2022-08-06 DIAGNOSIS — Z6827 Body mass index (BMI) 27.0-27.9, adult: Secondary | ICD-10-CM | POA: Diagnosis not present

## 2022-08-06 DIAGNOSIS — E663 Overweight: Secondary | ICD-10-CM | POA: Diagnosis present

## 2022-08-06 DIAGNOSIS — Z91030 Bee allergy status: Secondary | ICD-10-CM | POA: Diagnosis not present

## 2022-08-06 DIAGNOSIS — I700 Atherosclerosis of aorta: Secondary | ICD-10-CM | POA: Diagnosis present

## 2022-08-06 DIAGNOSIS — K860 Alcohol-induced chronic pancreatitis: Secondary | ICD-10-CM | POA: Diagnosis present

## 2022-08-06 DIAGNOSIS — K861 Other chronic pancreatitis: Secondary | ICD-10-CM | POA: Diagnosis not present

## 2022-08-06 DIAGNOSIS — I10 Essential (primary) hypertension: Secondary | ICD-10-CM | POA: Diagnosis present

## 2022-08-06 DIAGNOSIS — R739 Hyperglycemia, unspecified: Secondary | ICD-10-CM | POA: Diagnosis present

## 2022-08-06 DIAGNOSIS — Z79899 Other long term (current) drug therapy: Secondary | ICD-10-CM | POA: Diagnosis not present

## 2022-08-06 DIAGNOSIS — Z833 Family history of diabetes mellitus: Secondary | ICD-10-CM | POA: Diagnosis not present

## 2022-08-06 DIAGNOSIS — K852 Alcohol induced acute pancreatitis without necrosis or infection: Secondary | ICD-10-CM | POA: Diagnosis present

## 2022-08-06 DIAGNOSIS — F1011 Alcohol abuse, in remission: Secondary | ICD-10-CM | POA: Diagnosis present

## 2022-08-06 DIAGNOSIS — E785 Hyperlipidemia, unspecified: Secondary | ICD-10-CM | POA: Diagnosis present

## 2022-08-06 DIAGNOSIS — G8929 Other chronic pain: Secondary | ICD-10-CM | POA: Diagnosis present

## 2022-08-06 DIAGNOSIS — Z803 Family history of malignant neoplasm of breast: Secondary | ICD-10-CM | POA: Diagnosis not present

## 2022-08-06 DIAGNOSIS — Z8249 Family history of ischemic heart disease and other diseases of the circulatory system: Secondary | ICD-10-CM | POA: Diagnosis not present

## 2022-08-06 DIAGNOSIS — Z20822 Contact with and (suspected) exposure to covid-19: Secondary | ICD-10-CM | POA: Diagnosis present

## 2022-08-06 LAB — COMPREHENSIVE METABOLIC PANEL
ALT: 16 U/L (ref 0–44)
AST: 13 U/L — ABNORMAL LOW (ref 15–41)
Albumin: 3 g/dL — ABNORMAL LOW (ref 3.5–5.0)
Alkaline Phosphatase: 62 U/L (ref 38–126)
Anion gap: 3 — ABNORMAL LOW (ref 5–15)
BUN: 9 mg/dL (ref 6–20)
CO2: 28 mmol/L (ref 22–32)
Calcium: 8.3 mg/dL — ABNORMAL LOW (ref 8.9–10.3)
Chloride: 109 mmol/L (ref 98–111)
Creatinine, Ser: 0.86 mg/dL (ref 0.61–1.24)
GFR, Estimated: >60 mL/min (ref >60)
Glucose, Bld: 94 mg/dL (ref 70–99)
Potassium: 3.7 mmol/L (ref 3.5–5.1)
Sodium: 140 mmol/L (ref 135–145)
Total Bilirubin: 0.5 mg/dL (ref 0.3–1.2)
Total Protein: 6 g/dL — ABNORMAL LOW (ref 6.5–8.1)

## 2022-08-06 LAB — CBC
HCT: 35.8 % — ABNORMAL LOW (ref 39.0–52.0)
Hemoglobin: 11.8 g/dL — ABNORMAL LOW (ref 13.0–17.0)
MCH: 28.9 pg (ref 26.0–34.0)
MCHC: 33 g/dL (ref 30.0–36.0)
MCV: 87.7 fL (ref 80.0–100.0)
Platelets: 187 10*3/uL (ref 150–400)
RBC: 4.08 MIL/uL — ABNORMAL LOW (ref 4.22–5.81)
RDW: 12.9 % (ref 11.5–15.5)
WBC: 4.7 10*3/uL (ref 4.0–10.5)
nRBC: 0 % (ref 0.0–0.2)

## 2022-08-06 LAB — LIPASE, BLOOD: Lipase: 80 U/L — ABNORMAL HIGH (ref 11–51)

## 2022-08-06 MED ORDER — OXYCODONE HCL 5 MG PO TABS
5.0000 mg | ORAL_TABLET | Freq: Four times a day (QID) | ORAL | Status: DC | PRN
  Administered 2022-08-06: 5 mg via ORAL
  Filled 2022-08-06: qty 1

## 2022-08-06 MED ORDER — ENOXAPARIN SODIUM 40 MG/0.4ML IJ SOSY
40.0000 mg | PREFILLED_SYRINGE | Freq: Every day | INTRAMUSCULAR | Status: DC
  Administered 2022-08-06 – 2022-08-07 (×2): 40 mg via SUBCUTANEOUS
  Filled 2022-08-06 (×2): qty 0.4

## 2022-08-06 MED ORDER — PANTOPRAZOLE SODIUM 40 MG PO TBEC: 40.0000 mg | DELAYED_RELEASE_TABLET | Freq: Every day | ORAL | Status: DC

## 2022-08-06 NOTE — Progress Notes (Signed)

## 2022-08-06 NOTE — Assessment & Plan Note (Signed)
Admitted and started on IV opiates, IV fluids Required IV fentanyl 5 times overnight Today, lipase trending down, hungry but still in pain after clear liquids - Continue IV fluids

## 2022-08-06 NOTE — Assessment & Plan Note (Signed)
-   Continue amlodipine ?

## 2022-08-06 NOTE — Assessment & Plan Note (Deleted)
Not on statin 

## 2022-08-06 NOTE — Assessment & Plan Note (Signed)
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 - Needs imaging with MRI next summer Follow-up

## 2022-08-06 NOTE — Assessment & Plan Note (Signed)
BP normal - Continue amlodipine

## 2022-08-06 NOTE — Progress Notes (Signed)
  Progress Note   Patient: Max Ayers IAX:[id number] DOB: Sep 12, 1971 DOA: 08/04/2022     0 DOS: the patient was seen and examined on 08/06/2022 at 11:10AM      Brief hospital course: Max Ayers is a 51 y.o.  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 - Needs imaging with MRI next summer Follow-up  Aortic atherosclerosis (HCC) - Continue amlodipine  Benign essential HTN BP normal - Continue amlodipine          Subjective: Patient still with pain this morning, but closer to his baseline, no vomiting, hungry.  No fever, no confusion, no respiratory symptoms, no malaise.     Physical Exam: Vitals:   08/06/22 0100 08/06/22 0517 08/06/22 0943 08/06/22 1401  BP: 131/83 136/80 130/78 122/66  Pulse: 62 73  60  Resp:  18  16  Temp: 99.1 F (37.3 C) 98.1 F (36.7 C)  98.4 F (36.9 C)  TempSrc: Oral Oral  Oral  SpO2: 99% 100%  99%  Weight:      Height:       Adult male, lying in bed, no acute distress, watching television RRR, no murmurs, no peripheral edema Respiratory rate normal, lungs clear without rales or wheezes Abdomen with voluntary guarding diffusely tender, no distention, no rigidity Attention normal, affect normal, judgment and insight appear normal Face metric, speech fluent      Data Reviewed: Lipase improving CMP normal Complete blood count shows hemoglobin slightly down, dilution anemia       Disposition: Status  is: Inpatient         Author: Edwin Dada, MD 08/06/2022 3:53 PM  For on call review [email or web address].

## 2022-08-06 NOTE — Hospital Course (Addendum)
Max Ayers is a 51 y.o.  M with HTN, prior alcohol related pancreatitis now with chronic abdominal pain due to chronic pancreatitis who presented abdominal pain, N/V for 3 days after eating pizza

## 2022-08-07 DIAGNOSIS — K861 Other chronic pancreatitis: Secondary | ICD-10-CM | POA: Diagnosis not present

## 2022-08-07 DIAGNOSIS — K859 Acute pancreatitis without necrosis or infection, unspecified: Secondary | ICD-10-CM | POA: Diagnosis not present

## 2022-08-07 LAB — LIPASE, BLOOD: Lipase: 37 U/L (ref 11–51)

## 2022-08-07 LAB — COMPREHENSIVE METABOLIC PANEL
ALT: 15 U/L (ref 0–44)
AST: 13 U/L — ABNORMAL LOW (ref 15–41)
Albumin: 3.3 g/dL — ABNORMAL LOW (ref 3.5–5.0)
Alkaline Phosphatase: 66 U/L (ref 38–126)
Anion gap: 7 (ref 5–15)
BUN: 9 mg/dL (ref 6–20)
CO2: 27 mmol/L (ref 22–32)
Calcium: 8.9 mg/dL (ref 8.9–10.3)
Chloride: 107 mmol/L (ref 98–111)
Creatinine, Ser: 0.93 mg/dL (ref 0.61–1.24)
GFR, Estimated: >60 mL/min (ref >60)
Glucose, Bld: 98 mg/dL (ref 70–99)
Potassium: 3.7 mmol/L (ref 3.5–5.1)
Sodium: 141 mmol/L (ref 135–145)
Total Bilirubin: 0.4 mg/dL (ref 0.3–1.2)
Total Protein: 6.7 g/dL (ref 6.5–8.1)

## 2022-08-07 MED ORDER — POLYETHYLENE GLYCOL 3350 17 G PO PACK
17.0000 g | PACK | Freq: Every day | ORAL | Status: DC
  Administered 2022-08-07: 17 g via ORAL
  Filled 2022-08-07: qty 1

## 2022-08-07 MED ORDER — POLYETHYLENE GLYCOL 3350 17 G PO PACK: 17.0000 g | PACK | Freq: Every day | ORAL | 0 refills | Status: DC

## 2022-08-07 MED ORDER — SENNOSIDES-DOCUSATE SODIUM 8.6-50 MG PO TABS
1.0000 | ORAL_TABLET | Freq: Once | ORAL | Status: AC
  Administered 2022-08-07: 1 via ORAL
  Filled 2022-08-07: qty 1

## 2022-08-07 MED ORDER — ONDANSETRON HCL 4 MG PO TABS: 4.0000 mg | ORAL_TABLET | Freq: Four times a day (QID) | ORAL | 0 refills | Status: DC | PRN

## 2022-08-07 MED ORDER — SENNOSIDES-DOCUSATE SODIUM 8.6-50 MG PO TABS: 1.0000 | ORAL_TABLET | Freq: Two times a day (BID) | ORAL | 0 refills | Status: DC | PRN

## 2022-08-07 MED ORDER — OXYCODONE-ACETAMINOPHEN 5-325 MG PO TABS: 1.0000 | ORAL_TABLET | Freq: Three times a day (TID) | ORAL | 0 refills | Status: DC | PRN

## 2022-08-07 NOTE — Plan of Care (Signed)

## 2022-08-07 NOTE — TOC CM/SW Note (Signed)
  Transition of Care [facility]) Screening Note   Patient Details  Name: Max Ayers Date of Birth: 04-Feb-1971   Transition of Care [facility]) CM/SW Contact:    Ross Ludwig, LCSW Phone Number: 08/07/2022, 11:43 AM    Transition of Care Department [facility]) has reviewed patient and no TOC needs have been identified at this time. We will continue to monitor patient advancement through interdisciplinary progression rounds. If new patient transition needs arise, please place a TOC consult.

## 2022-08-07 NOTE — Discharge Summary (Signed)
Physician Discharge Summary   Patient: Max Ayers MRN: [id number] DOB: 06-Jun-1971  Admit date:     08/04/2022  Discharge date: 08/07/22  Discharge Physician: Edwin Dada   PCP: Max Niece, PA     Recommendations at discharge:  Follow-up with [facility] gastroenterology Rowe Clack on September 26 for chronic pancreatitis [name]: Please obtain MRI pancreas in 1 year     Discharge Diagnoses: Principal Problem:   Acute on chronic pancreatitis [facility]) Active Problems:   Benign essential HTN   Aortic atherosclerosis (HCC)   Pancreatic pseudocyst      Hospital Course: [name] is a 51 y.o.  M with HTN, prior alcohol related pancreatitis now with chronic abdominal pain due to chronic pancreatitis who presented abdominal pain, N/V for 3 days after eating pizza     * Acute on chronic pancreatitis ([place]) Admitted and started on IV opiates, IV fluids.  CT of the abdomen showed his known pancreatic pseudocyst, no change from previous.  Lipase trended to normal, he was able to transition to oral oxycodone and advance his diet without difficulty to a low fiber diet.    Pancreatic pseudocyst Seen again on MRI last month, present since 2021, MRI features consistent with complex pancreatic pseudocyst, walled off necrosis. - Needs imaging with MRI next summer Follow-up  Aortic atherosclerosis (HCC) Benign essential HTN Stable on amlodipine.  LDL 129.             The [facility] Controlled Substances Registry was reviewed for this patient prior to discharge.  Disposition: Home Diet recommendation: Low residue   DISCHARGE MEDICATION: Allergies as of 08/07/2022       Reactions   Yellow Jacket Venom [bee Venom] Anaphylaxis        Medication List     STOP taking these medications    acetaminophen 500 MG tablet Commonly known as: TYLENOL       TAKE these medications    amLODipine 5 MG tablet Commonly known as: NORVASC Take 5 mg  by mouth 2 (two) times a week.   ondansetron 4 MG tablet Commonly known as: ZOFRAN Take 1 tablet (4 mg total) by mouth every 6 (six) hours as needed for nausea.   oxyCODONE-acetaminophen 5-325 MG tablet Commonly known as: Percocet Take 1 tablet by mouth every 8 (eight) hours as needed. What changed:  how much to take when to take this   polyethylene glycol 17 g packet Commonly known as: MIRALAX / GLYCOLAX Take 17 g by mouth daily.   senna-docusate 8.6-50 MG tablet Commonly known as: Senokot-S Take 1 tablet by mouth 2 (two) times daily as needed (constipation). What changed:  when to take this reasons to take this        Follow-up Information     Max Ayers, [place] Follow up.   Specialty: Physician Assistant Contact information: [address] [place] [address] [phone]         Max Ayers, Max Cooley, PA Follow up.   Specialty: Physician Assistant Why: Go to appointment on Sep 26 at [facility] information: [address] [place] [address] [phone]                 Discharge Instructions     Discharge instructions   Complete by: As directed    From Dr. Nelva Bush were admitted for acute on chronic pancreatitis You were treated with fluids and got better  You should advance your diet slowly AVOID fatty, greasy foods.  AVOID *meat* and dairy for the next week  Stick to foods like bananas, rice, toast, crackers, graham crackers, applesauce, cooked vegetables, grits, cereal  Go see GI on the 26th (see below in the To do section)  If you have another flare, try to treat at home by drinking water, taking ondansetron/Zofran for nausea and Percocet for pain Dispose of excess percocet, do not take before driving or before work   Increase activity slowly   Complete by: As directed        Discharge Exam: Filed Weights   08/04/22 2151  Weight: 88.9 kg    General: Pt is alert, awake, not in acute  distress Cardiovascular: RRR, nl S1-S2, no murmurs appreciated.   No LE edema.   Respiratory: Normal respiratory rate and rhythm.  CTAB without rales or wheezes. Abdominal: Abdomen soft and non-tender.  No distension or HSM.   Neuro/Psych: Strength symmetric in upper and lower extremities.  Judgment and insight appear normal.   Condition at discharge: good  The results of significant diagnostics from this hospitalization (including imaging, microbiology, ancillary and laboratory) are listed below for reference.   Imaging Studies: No results found.  Microbiology: Results for orders placed or performed during the hospital encounter of 12/13/21  Resp Panel by RT-PCR (Flu A&B, Covid) Nasopharyngeal Swab     Status: None   Collection Time: 12/13/21 10:54 PM   Specimen: Nasopharyngeal Swab; Nasopharyngeal(NP) swabs in vial transport medium  Result Value Ref Range Status   SARS Coronavirus 2 by RT PCR NEGATIVE NEGATIVE Final    Comment: (NOTE) SARS-CoV-2 target nucleic acids are NOT DETECTED.  The SARS-CoV-2 RNA is generally detectable in upper respiratory specimens during the acute phase of infection. The lowest concentration of SARS-CoV-2 viral copies this assay can detect is 138 copies/mL. A negative result does not preclude SARS-Cov-2 infection and should not be used as the sole basis for treatment or other patient management decisions. A negative result may occur with  improper specimen collection/handling, submission of specimen other than nasopharyngeal swab, presence of viral mutation(s) within the areas targeted by this assay, and inadequate number of viral copies(<138 copies/mL). A negative result must be combined with clinical observations, patient history, and epidemiological information. The expected result is Negative.  Fact Sheet for Patients:  EntrepreneurPulse.com.au  Fact Sheet for Healthcare Providers:   IncredibleEmployment.be  This test is no t yet approved or cleared by the [place] FDA and  has been authorized for detection and/or diagnosis of SARS-CoV-2 by FDA under an Emergency Use Authorization (EUA). This EUA will remain  in effect (meaning this test can be used) for the duration of the COVID-19 declaration under Section 564(b)(1) of the Act, 21 U.S.C.section 360bbb-3(b)(1), unless the authorization is terminated  or revoked sooner.       Influenza A by PCR NEGATIVE NEGATIVE Final   Influenza B by PCR NEGATIVE NEGATIVE Final    Comment: (NOTE) The Xpert Xpress SARS-CoV-2/FLU/RSV plus assay is intended as an aid in the diagnosis of influenza from Nasopharyngeal swab specimens and should not be used as a sole basis for treatment. Nasal washings and aspirates are unacceptable for Xpert Xpress SARS-CoV-2/FLU/RSV testing.  Fact Sheet for Patients: EntrepreneurPulse.com.au  Fact Sheet for Healthcare Providers: IncredibleEmployment.be  This test is not yet approved or cleared by the [place] FDA and has been authorized for detection and/or diagnosis of SARS-CoV-2 by FDA under an Emergency Use Authorization (EUA). This EUA will remain in effect (meaning this test can be used)  for the duration of the COVID-19 declaration under Section 564(b)(1) of the Act, 21 U.S.C. section 360bbb-3(b)(1), unless the authorization is terminated or revoked.  Performed at [facility], [address] [place], [place] [address]     Labs: CBC: Recent Labs  Lab 08/04/22 2200 08/05/22 1605 08/06/22 0445  WBC 5.7 4.6 4.7  HGB 13.6 13.1 11.8*  HCT 40.6 39.6 35.8*  MCV 86.4 88.2 87.7  PLT 219 202 914   Basic Metabolic Panel: Recent Labs  Lab 08/04/22 2200 08/05/22 1605 08/06/22 0445 08/07/22 0353  NA 140 142 140 141  K 3.8 3.8 3.7 3.7  CL 105 109 109 107  CO2 '27 29 28 27  '$ GLUCOSE 122* 92 94 98  BUN '17  11 9 9  '$ CREATININE 1.14 0.92 0.86 0.93  CALCIUM 9.0 8.9 8.3* 8.9  MG  --  2.1  --   --   PHOS  --  2.2*  --   --    Liver Function Tests: Recent Labs  Lab 08/04/22 2200 08/05/22 1605 08/06/22 0445 08/07/22 0353  AST 21 16 13* 13*  ALT '24 19 16 15  '$ ALKPHOS 75 72 62 66  BILITOT 0.4 0.5 0.5 0.4  PROT 7.8 6.9 6.0* 6.7  ALBUMIN 4.0 3.6 3.0* 3.3*   CBG: No results for input(s): "GLUCAP" in the last 168 hours.  Discharge time spent: approximately 25 minutes spent on discharge counseling, evaluation of patient on day of discharge, and coordination of discharge planning with nursing, social work, pharmacy and case management  Signed: Edwin Dada, MD [facility] 08/07/2022

## 2022-08-07 NOTE — Progress Notes (Signed)
Discharge instructions provided to and reviewed with patient, patient verbalized understanding.  PIV removed.  Patient transported to main entrance via wheelchair with belongings for transport home with uncle.  Angie Fava, RN

## 2022-09-05 ENCOUNTER — Other Ambulatory Visit: Payer: Self-pay

## 2022-09-05 ENCOUNTER — Emergency Department (HOSPITAL_BASED_OUTPATIENT_CLINIC_OR_DEPARTMENT_OTHER): Payer: Medicaid Other

## 2022-09-05 ENCOUNTER — Encounter (HOSPITAL_BASED_OUTPATIENT_CLINIC_OR_DEPARTMENT_OTHER): Payer: Self-pay | Admitting: Emergency Medicine

## 2022-09-05 ENCOUNTER — Encounter (HOSPITAL_COMMUNITY): Payer: Self-pay

## 2022-09-05 ENCOUNTER — Inpatient Hospital Stay (HOSPITAL_BASED_OUTPATIENT_CLINIC_OR_DEPARTMENT_OTHER)
Admission: EM | Admit: 2022-09-05 | Discharge: 2022-09-11 | DRG: 439 | Disposition: A | Discharge status: Home / Self Care | Payer: Medicaid Other | Attending: Internal Medicine | Admitting: Internal Medicine

## 2022-09-05 DIAGNOSIS — K862 Cyst of pancreas: Secondary | ICD-10-CM | POA: Diagnosis present

## 2022-09-05 DIAGNOSIS — Z91030 Bee allergy status: Secondary | ICD-10-CM

## 2022-09-05 DIAGNOSIS — Z833 Family history of diabetes mellitus: Secondary | ICD-10-CM

## 2022-09-05 DIAGNOSIS — Z720 Tobacco use: Secondary | ICD-10-CM | POA: Diagnosis present

## 2022-09-05 DIAGNOSIS — E876 Hypokalemia: Secondary | ICD-10-CM | POA: Diagnosis present

## 2022-09-05 DIAGNOSIS — F17290 Nicotine dependence, other tobacco product, uncomplicated: Secondary | ICD-10-CM | POA: Diagnosis present

## 2022-09-05 DIAGNOSIS — R001 Bradycardia, unspecified: Secondary | ICD-10-CM | POA: Diagnosis present

## 2022-09-05 DIAGNOSIS — I10 Essential (primary) hypertension: Secondary | ICD-10-CM | POA: Diagnosis present

## 2022-09-05 DIAGNOSIS — Z79899 Other long term (current) drug therapy: Secondary | ICD-10-CM

## 2022-09-05 DIAGNOSIS — K863 Pseudocyst of pancreas: Secondary | ICD-10-CM | POA: Diagnosis present

## 2022-09-05 DIAGNOSIS — Z803 Family history of malignant neoplasm of breast: Secondary | ICD-10-CM

## 2022-09-05 DIAGNOSIS — Z8249 Family history of ischemic heart disease and other diseases of the circulatory system: Secondary | ICD-10-CM

## 2022-09-05 DIAGNOSIS — K859 Acute pancreatitis without necrosis or infection, unspecified: Principal | ICD-10-CM | POA: Diagnosis present

## 2022-09-05 DIAGNOSIS — K219 Gastro-esophageal reflux disease without esophagitis: Secondary | ICD-10-CM | POA: Diagnosis present

## 2022-09-05 DIAGNOSIS — K852 Alcohol induced acute pancreatitis without necrosis or infection: Principal | ICD-10-CM

## 2022-09-05 LAB — COMPREHENSIVE METABOLIC PANEL
ALT: 18 U/L (ref 0–44)
AST: 21 U/L (ref 15–41)
Albumin: 4 g/dL (ref 3.5–5.0)
Alkaline Phosphatase: 73 U/L (ref 38–126)
Anion gap: 8 (ref 5–15)
BUN: 12 mg/dL (ref 6–20)
CO2: 26 mmol/L (ref 22–32)
Calcium: 8.8 mg/dL — ABNORMAL LOW (ref 8.9–10.3)
Chloride: 104 mmol/L (ref 98–111)
Creatinine, Ser: 0.93 mg/dL (ref 0.61–1.24)
GFR, Estimated: >60 mL/min (ref >60)
Glucose, Bld: 110 mg/dL — ABNORMAL HIGH (ref 70–99)
Potassium: 3.8 mmol/L (ref 3.5–5.1)
Sodium: 138 mmol/L (ref 135–145)
Total Bilirubin: 0.6 mg/dL (ref 0.3–1.2)
Total Protein: 7.5 g/dL (ref 6.5–8.1)

## 2022-09-05 LAB — LIPASE, BLOOD: Lipase: 2486 U/L — ABNORMAL HIGH (ref 11–51)

## 2022-09-05 LAB — CBC
HCT: 41.9 % (ref 39.0–52.0)
Hemoglobin: 14.5 g/dL (ref 13.0–17.0)
MCH: 29.6 pg (ref 26.0–34.0)
MCHC: 34.6 g/dL (ref 30.0–36.0)
MCV: 85.5 fL (ref 80.0–100.0)
Platelets: 215 10*3/uL (ref 150–400)
RBC: 4.9 MIL/uL (ref 4.22–5.81)
RDW: 13.8 % (ref 11.5–15.5)
WBC: 7.6 10*3/uL (ref 4.0–10.5)
nRBC: 0 % (ref 0.0–0.2)

## 2022-09-05 MED ORDER — SODIUM CHLORIDE 0.9 % IV BOLUS
1000.0000 mL | Freq: Once | INTRAVENOUS | Status: AC
  Administered 2022-09-05: 1000 mL via INTRAVENOUS

## 2022-09-05 MED ORDER — HYDROMORPHONE HCL 1 MG/ML IJ SOLN
1.0000 mg | Freq: Once | INTRAMUSCULAR | Status: AC
  Administered 2022-09-05: 1 mg via INTRAVENOUS
  Filled 2022-09-05: qty 1

## 2022-09-05 MED ORDER — IOHEXOL 300 MG/ML  SOLN
100.0000 mL | Freq: Once | INTRAMUSCULAR | Status: AC | PRN
  Administered 2022-09-05: 100 mL via INTRAVENOUS

## 2022-09-05 MED ORDER — HYDROMORPHONE HCL 1 MG/ML IJ SOLN
1.0000 mg | Freq: Once | INTRAMUSCULAR | Status: AC
  Administered 2022-09-06: 1 mg via INTRAVENOUS
  Filled 2022-09-05: qty 1

## 2022-09-05 MED ORDER — HYDROMORPHONE HCL 1 MG/ML IJ SOLN
1.0000 mg | INTRAMUSCULAR | Status: DC | PRN
  Administered 2022-09-06 – 2022-09-10 (×25): 1 mg via INTRAVENOUS
  Filled 2022-09-05 (×25): qty 1

## 2022-09-05 MED ORDER — ONDANSETRON HCL 4 MG/2ML IJ SOLN
4.0000 mg | Freq: Once | INTRAMUSCULAR | Status: AC
  Administered 2022-09-05: 4 mg via INTRAVENOUS
  Filled 2022-09-05: qty 2

## 2022-09-05 MED ORDER — SODIUM CHLORIDE 0.9 % IV SOLN: INTRAVENOUS | Status: DC

## 2022-09-05 NOTE — ED Triage Notes (Signed)
Patient arrived via POV c/o abdominal pain w/ SHOB x 1 day. Patient states RLQ pain to start, moving up to RUQ/LUQ. Patient has hx of pancreatitis, states this feels like last acute attack. Patient states 10/10 pain. Patient is AO x 4, VS w/ elevated BP, slow gait.

## 2022-09-05 NOTE — ED Provider Notes (Signed)
[facility] Provider Note   CSN: [id number] Arrival date & time: 09/05/22  2109     History  Chief Complaint  Patient presents with   Abdominal Pain   Shortness of Breath    Max Ayers is a 51 y.o. male.  The history is provided by the patient and medical records. No language interpreter was used.  Abdominal Pain Associated symptoms: shortness of breath   Shortness of Breath Associated symptoms: abdominal pain    Patient is a 51 year old male presenting with a chief complaint of abdominal pain. Patient has a PMH of pancreatitis and states this feel similar. The abdominal pain started this morning and has been worsening throughout the day. Patient states the pain started in his lower abdomen and has spread to his upper abdomen. It is worse in the epigastric region. He describes the pain as a "burning pain". He rates the pain a 10/10. The pain radiates to his back. He states he has not drank alcohol in three months. Patient also reports dyspnea, nausea, and vomiting. He denies fever, cough, chest pain, diarrhea, constipation.     Home Medications Prior to Admission medications   Medication Sig Start Date End Date Taking? Authorizing Provider  amLODipine (NORVASC) 5 MG tablet Take 5 mg by mouth 2 (two) times a week.    [provider]  ondansetron (ZOFRAN) 4 MG tablet Take 1 tablet (4 mg total) by mouth every 6 (six) hours as needed for nausea. 08/07/22   Danford, Suann Larry, MD  oxyCODONE-acetaminophen (PERCOCET) 5-325 MG tablet Take 1 tablet by mouth every 8 (eight) hours as needed. 08/07/22   Danford, Suann Larry, MD  polyethylene glycol (MIRALAX / GLYCOLAX) 17 g packet Take 17 g by mouth daily. 08/07/22   Danford, Suann Larry, MD  senna-docusate (SENOKOT-S) 8.6-50 MG tablet Take 1 tablet by mouth 2 (two) times daily as needed (constipation). 08/07/22   Danford, Suann Larry, MD      Allergies    Yellow jacket venom [bee venom]     Review of Systems   Review of Systems  Respiratory:  Positive for shortness of breath.   Gastrointestinal:  Positive for abdominal pain.  All other systems reviewed and are negative.   Physical Exam Updated Vital Signs BP (!) 190/80 (BP Location: Left Arm)   Pulse 72   Temp 98 F (36.7 C)   Resp (!) 22   Ht 5' 11"  (1.803 m)   Wt 88.5 kg   SpO2 93%   BMI 27.20 kg/m  Physical Exam Vitals and nursing note reviewed.  Constitutional:      Appearance: He is well-developed. He is ill-appearing.  HENT:     Head: Atraumatic.  Eyes:     Conjunctiva/sclera: Conjunctivae normal.  Cardiovascular:     Rate and Rhythm: Normal rate and regular rhythm.  Pulmonary:     Breath sounds: No wheezing, rhonchi or rales.     Comments: Tachypneic Abdominal:     Palpations: Abdomen is soft.     Tenderness: There is abdominal tenderness (Tenderness to the epigastric and left upper quadrant without guarding or rebound tenderness.).  Musculoskeletal:     Cervical back: Neck supple.  Skin:    Findings: No rash.  Neurological:     Mental Status: He is alert.     ED Results / Procedures / Treatments   Labs (all labs ordered are listed, but only abnormal results are displayed) Labs Reviewed  LIPASE, BLOOD - Abnormal; Notable  for the following components:      Result Value   Lipase 2,486 (*)    All other components within normal limits  COMPREHENSIVE METABOLIC PANEL - Abnormal; Notable for the following components:   Glucose, Bld 110 (*)    Calcium 8.8 (*)    All other components within normal limits  CBC  URINALYSIS, ROUTINE W REFLEX MICROSCOPIC  ETHANOL    EKG None  Radiology CT ABDOMEN PELVIS W CONTRAST  Result Date: 09/05/2022 CLINICAL DATA:  Left upper quadrant abdominal pain EXAM: CT ABDOMEN AND PELVIS WITH CONTRAST TECHNIQUE: Multidetector CT imaging of the abdomen and pelvis was performed using the standard protocol following bolus administration of intravenous contrast.  RADIATION DOSE REDUCTION: This exam was performed according to the departmental dose-optimization program which includes automated exposure control, adjustment of the mA and/or kV according to patient size and/or use of iterative reconstruction technique. CONTRAST:  128m OMNIPAQUE IOHEXOL 300 MG/ML  SOLN COMPARISON:  12/13/2021 FINDINGS: Lower chest: No acute abnormality. Hepatobiliary: No focal liver abnormality is seen. No gallstones, gallbladder wall thickening, or biliary dilatation. Pancreas: There are extensive peripancreatic inflammatory changes identified in keeping with recurrent, acute interstitial/edematous pancreatitis. Normal enhancement of the pancreatic parenchyma. 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 The pancreatic duct is not dilated. No parenchymal calcifications. No loculated peripancreatic fluid collections are identified. Spleen: Unremarkable Adrenals/Urinary Tract: Adrenal glands are unremarkable. Kidneys are normal, without renal calculi, focal lesion, or hydronephrosis. Bladder is unremarkable. Stomach/Bowel: Stomach is within normal limits. Appendix appears normal. No evidence of bowel wall thickening, distention, or inflammatory changes. Vascular/Lymphatic: Aortic atherosclerosis. No enlarged abdominal or pelvic lymph nodes. Reproductive: Prostate is unremarkable. Other: No abdominal wall hernia or abnormality. No abdominopelvic ascites. Musculoskeletal: No acute or significant osseous findings. IMPRESSION: 1. Recurrent acute interstitial/edematous pancreatitis. No evidence of pancreatic or peripancreatic necrosis. No loculated peripancreatic fluid collections. No ascites. 2. 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 Aortic Atherosclerosis (ICD10-I70.0). Electronically Signed   By: AFidela SalisburyM.D.   On: 09/05/2022 23:01   DG Chest 2 View  Result Date: 09/05/2022 CLINICAL DATA:  Shortness of breath EXAM: CHEST - 2 VIEW  COMPARISON:  Chest x-ray 12/14/2021 FINDINGS: The heart size and mediastinal contours are within normal limits. Both lungs are clear. The visualized skeletal structures are unremarkable. IMPRESSION: No active cardiopulmonary disease. Electronically Signed   By: ARonney AstersM.D.   On: 09/05/2022 21:37    Procedures Procedures    Medications Ordered in ED Medications  HYDROmorphone (DILAUDID) injection 1 mg (has no administration in time range)  0.9 %  sodium chloride infusion (has no administration in time range)  HYDROmorphone (DILAUDID) injection 1 mg (has no administration in time range)  HYDROmorphone (DILAUDID) injection 1 mg (1 mg Intravenous Given 09/05/22 2221)  ondansetron (ZOFRAN) injection 4 mg (4 mg Intravenous Given 09/05/22 2221)  sodium chloride 0.9 % bolus 1,000 mL (0 mLs Intravenous Stopped 09/05/22 2352)  iohexol (OMNIPAQUE) 300 MG/ML solution 100 mL (100 mLs Intravenous Contrast Given 09/05/22 2245)    ED Course/ Medical Decision Making/ A&P                           Medical Decision Making Amount and/or Complexity of Data Reviewed Labs: ordered. Radiology: ordered.  Risk Prescription drug management.   BP (!) 190/80 (BP Location: Left Arm)   Pulse 72   Temp 98 F (36.7 C)   Resp (!) 22  Ht 5' 11"  (1.803 m)   Wt 88.5 kg   SpO2 93%   BMI 27.20 kg/m   35:31 PM 51 year old male with significant history of alcoholic induced pancreatitis presenting complaining of abdominal pain.  Patient endorsed having upper abdominal pain with associated nausea and vomiting throughout the day today that felt similar to 2 prior pancreatitis flare.  He is unable to keep anything down.  He endorsed some chills with his symptoms.  He endorsed shortness of breath when he actively vomiting.  He does not endorse any fever no dysuria no diarrhea constipation.  Patient report he has not drink any alcohol for several weeks.  He was admitted a month ago for similar presentation.  He denies  any recent marijuana use or drug use.  No recent medication changes.  On exam patient appears uncomfortable, actively dry heaving.  He does have tenderness to his epigastric and left lower quadrant on palpation.  Vital signs notable for blood pressure of 190/80 and respiratory rate of 22.  He is afebrile, O2 sats of 93% on room air.  Given similar presentation to prior pancreatitis work-up initiated, will obtain abdominal pelvis CT scan for further assessment.  Patient given IV fluid, antiemetic, and opiate pain medication for symptom control.  11:56 PM Labs and imaging obtained independently viewed and interpreted by me and I agree with radiologist interpretation.  Labs remarkable for markedly elevated lipase of 2486.  Normal WBC, normal liver enzymes and normal alk phos.  CT scan of the abdomen pelvis obtained demonstrate recurrent acute interstitial/edematous pancreatitis without evidence of peripancreatic necrosis and no loculated fluid collection.  Patient still having recurrent pain and additional opiate and pain medication was given.  I discussed this finding with patient and recommend admission and patient agrees.  Appreciate consultation from [facility], Dr. Cyd Silence who who agrees to admit patient for further managements of his condition.  This patient presents to the ED for concern of abd pain, this involves an extensive number of treatment options, and is a complaint that carries with it a high risk of complications and morbidity.  The differential diagnosis includes pancreatitis, gastritis, PUD, PNA, ACS, diverticulitis, dissection, kidney stone  Co morbidities that complicate the patient evaluation hx of alcohol induced pancreatitis Additional history obtained:  Additional history obtained from patient External records from outside source obtained and reviewed including EMR including prior labs and imaging  Lab Tests:  I Ordered, and personally interpreted labs.  The  pertinent results include:  as above  Imaging Studies ordered:  I ordered imaging studies including abd/pelvis CT I independently visualized and interpreted imaging which showed acute pancreatitis I agree with the radiologist interpretation  Cardiac Monitoring:  The patient was maintained on a cardiac monitor.  I personally viewed and interpreted the cardiac monitored which showed an underlying rhythm of: NRS  Medicines ordered and prescription drug management:  I ordered medication including dilaudid  for pain Reevaluation of the patient after these medicines showed that the patient improved I have reviewed the patients home medicines and have made adjustments as needed  Test Considered: as above  Critical Interventions: IVF  Antiemetic  Opiate   Consultations Obtained:  I requested consultation with the hospitalist,  and discussed lab and imaging findings as well as pertinent plan - they recommend: admission  Problem List / ED Course: acute pancreatitis  Reevaluation:  After the interventions noted above, I reevaluated the patient and found that they have :improved  Social Determinants of Health: tobacco use  Dispostion:  After consideration of the diagnostic results and the patients response to treatment, I feel that the patent would benefit from admission.         Final Clinical Impression(s) / ED Diagnoses Final diagnoses:  Alcohol-induced acute pancreatitis without infection or necrosis    Rx / DC Orders ED Discharge Orders     None         Domenic Moras, PA-C 09/06/22 0000    Sherwood Gambler, MD 09/07/22 [phone]

## 2022-09-06 DIAGNOSIS — F17290 Nicotine dependence, other tobacco product, uncomplicated: Secondary | ICD-10-CM | POA: Diagnosis not present

## 2022-09-06 DIAGNOSIS — Z79899 Other long term (current) drug therapy: Secondary | ICD-10-CM | POA: Diagnosis not present

## 2022-09-06 DIAGNOSIS — K219 Gastro-esophageal reflux disease without esophagitis: Secondary | ICD-10-CM | POA: Diagnosis not present

## 2022-09-06 DIAGNOSIS — K863 Pseudocyst of pancreas: Secondary | ICD-10-CM | POA: Diagnosis not present

## 2022-09-06 DIAGNOSIS — Z833 Family history of diabetes mellitus: Secondary | ICD-10-CM | POA: Diagnosis not present

## 2022-09-06 DIAGNOSIS — Z803 Family history of malignant neoplasm of breast: Secondary | ICD-10-CM | POA: Diagnosis not present

## 2022-09-06 DIAGNOSIS — I10 Essential (primary) hypertension: Secondary | ICD-10-CM | POA: Diagnosis not present

## 2022-09-06 DIAGNOSIS — Z8249 Family history of ischemic heart disease and other diseases of the circulatory system: Secondary | ICD-10-CM | POA: Diagnosis not present

## 2022-09-06 DIAGNOSIS — K859 Acute pancreatitis without necrosis or infection, unspecified: Secondary | ICD-10-CM

## 2022-09-06 DIAGNOSIS — R001 Bradycardia, unspecified: Secondary | ICD-10-CM | POA: Diagnosis not present

## 2022-09-06 DIAGNOSIS — E876 Hypokalemia: Secondary | ICD-10-CM | POA: Diagnosis not present

## 2022-09-06 DIAGNOSIS — Z91030 Bee allergy status: Secondary | ICD-10-CM | POA: Diagnosis not present

## 2022-09-06 LAB — URINALYSIS, ROUTINE W REFLEX MICROSCOPIC
Bilirubin Urine: NEGATIVE
Glucose, UA: NEGATIVE mg/dL
Hgb urine dipstick: NEGATIVE
Ketones, ur: NEGATIVE mg/dL
Leukocytes,Ua: NEGATIVE
Nitrite: NEGATIVE
Protein, ur: NEGATIVE mg/dL
Specific Gravity, Urine: 1.015 (ref 1.005–1.030)
pH: 7 (ref 5.0–8.0)

## 2022-09-06 LAB — TRIGLYCERIDES: Triglycerides: 67 mg/dL (ref <150)

## 2022-09-06 LAB — ETHANOL: Alcohol, Ethyl (B): <10 mg/dL (ref <10)

## 2022-09-06 MED ORDER — ONDANSETRON HCL 4 MG PO TABS
4.0000 mg | ORAL_TABLET | Freq: Four times a day (QID) | ORAL | Status: DC | PRN
  Administered 2022-09-11: 4 mg via ORAL
  Filled 2022-09-06: qty 1

## 2022-09-06 MED ORDER — OXYCODONE-ACETAMINOPHEN 5-325 MG PO TABS
1.0000 | ORAL_TABLET | ORAL | Status: DC | PRN
  Administered 2022-09-08 – 2022-09-10 (×5): 2 via ORAL
  Filled 2022-09-06 (×5): qty 2

## 2022-09-06 MED ORDER — ONDANSETRON HCL 4 MG/2ML IJ SOLN
4.0000 mg | Freq: Four times a day (QID) | INTRAMUSCULAR | Status: DC | PRN
  Administered 2022-09-07 – 2022-09-10 (×3): 4 mg via INTRAVENOUS
  Filled 2022-09-06 (×3): qty 2

## 2022-09-06 MED ORDER — AMLODIPINE BESYLATE 5 MG PO TABS
5.0000 mg | ORAL_TABLET | Freq: Every day | ORAL | Status: DC
  Administered 2022-09-06 – 2022-09-11 (×6): 5 mg via ORAL
  Filled 2022-09-06 (×6): qty 1

## 2022-09-06 MED ORDER — ONDANSETRON HCL 4 MG/2ML IJ SOLN
4.0000 mg | Freq: Once | INTRAMUSCULAR | Status: AC
  Administered 2022-09-06: 4 mg via INTRAVENOUS
  Filled 2022-09-06: qty 2

## 2022-09-06 MED ORDER — ACETAMINOPHEN 650 MG RE SUPP: 650.0000 mg | Freq: Four times a day (QID) | RECTAL | Status: DC | PRN

## 2022-09-06 MED ORDER — SENNOSIDES-DOCUSATE SODIUM 8.6-50 MG PO TABS
1.0000 | ORAL_TABLET | Freq: Every evening | ORAL | Status: DC | PRN
  Administered 2022-09-08: 1 via ORAL
  Filled 2022-09-06: qty 1

## 2022-09-06 MED ORDER — PANTOPRAZOLE SODIUM 40 MG IV SOLR
40.0000 mg | Freq: Every day | INTRAVENOUS | Status: DC
  Administered 2022-09-06 – 2022-09-07 (×2): 40 mg via INTRAVENOUS
  Filled 2022-09-06 (×2): qty 10

## 2022-09-06 MED ORDER — ENOXAPARIN SODIUM 40 MG/0.4ML IJ SOSY
40.0000 mg | PREFILLED_SYRINGE | INTRAMUSCULAR | Status: DC
  Administered 2022-09-06 – 2022-09-10 (×5): 40 mg via SUBCUTANEOUS
  Filled 2022-09-06 (×6): qty 0.4

## 2022-09-06 MED ORDER — ACETAMINOPHEN 325 MG PO TABS: 650.0000 mg | ORAL_TABLET | Freq: Four times a day (QID) | ORAL | Status: DC | PRN

## 2022-09-06 NOTE — ED Notes (Signed)
C/o nausea, no emesis at this time. Consult attending MD

## 2022-09-06 NOTE — H&P (Signed)
History and Physical    Patient: Max Ayers HCW:[id number] DOB: 1970-12-21 DOA: 09/05/2022 DOS: the patient was seen and examined on 09/06/2022 PCP: Willeen Niece, PA  Patient coming from: Home  Chief Complaint:  Chief Complaint  Patient presents with   Abdominal Pain   Shortness of Breath   HPI: Max Ayers is a 51 y.o. male with medical history significant of recurrent pancreatitis, hypertension, presents to ED with nausea, persistent abdominal pain since 1 day,. He reports the last time he had alcohol was in June. He denies fever or chills, syncope. He denies any vomiting , diarrhea, fever, chills, chest pain, syncope, headache, dizziness, urinary symptoms, he reports using vaping. He also reports occasional sob on exertion. He denies sick contacts.  On arrival to ED, he was afebrile, bradycardic, hypertensive with bp190/80 mmhg.  Labs significant for 2486, triglycerides 67.  UA IS NEG. Ethyl alcohol is negative CT abd and pelvis Recurrent acute interstitial/edematous pancreatitis. No evidence of pancreatic or peripancreatic necrosis. No loculated peripancreatic fluid collections. No ascites. 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  He was referred to triad hospitalist for admission.   Review of Systems: As mentioned in the history of present illness. All other systems reviewed and are negative. Past Medical History:  Diagnosis Date   Hypertension    Pancreatitis    Past Surgical History:  Procedure Laterality Date   NO PAST SURGERIES     Social History:  reports that he has quit smoking. His smoking use included cigarettes and cigars. He smoked an average of 1 pack per day. He has never used smokeless tobacco. He reports that he does not currently use alcohol. He reports that he does not use drugs.  Allergies  Allergen Reactions   Yellow Jacket Venom [Bee Venom] Anaphylaxis    Family History  Problem Relation Age of Onset   Diabetes  Mother    Hypertension Father    Breast cancer Maternal Grandmother    Colon cancer Neg Hx    Stomach cancer Neg Hx    Liver disease Neg Hx    Pancreatic cancer Neg Hx    Esophageal cancer Neg Hx    Rectal cancer Neg Hx     Prior to Admission medications   Medication Sig Start Date End Date Taking? Authorizing Provider  amLODipine (NORVASC) 5 MG tablet Take 5 mg by mouth 2 (two) times a week. Wednesday and Friday   Yes [provider]  ondansetron (ZOFRAN) 4 MG tablet Take 1 tablet (4 mg total) by mouth every 6 (six) hours as needed for nausea. Patient not taking: Reported on 09/06/2022 08/07/22   Edwin Dada, MD  oxyCODONE-acetaminophen (PERCOCET) 5-325 MG tablet Take 1 tablet by mouth every 8 (eight) hours as needed. Patient not taking: Reported on 09/06/2022 08/07/22   Edwin Dada, MD  polyethylene glycol (MIRALAX / GLYCOLAX) 17 g packet Take 17 g by mouth daily. Patient not taking: Reported on 09/06/2022 08/07/22   Edwin Dada, MD  senna-docusate (SENOKOT-S) 8.6-50 MG tablet Take 1 tablet by mouth 2 (two) times daily as needed (constipation). Patient not taking: Reported on 09/06/2022 08/07/22   Edwin Dada, MD    Physical Exam: Vitals:   09/06/22 1338 09/06/22 1408 09/06/22 1608 09/06/22 1719  BP: (!) 183/95  (!) 141/64 (!) 170/98  Pulse: 76  72 84  Resp: '18  16 20  '$ Temp:  97.8 F (36.6 C)  99.3 F (37.4 C)  TempSrc:  Oral  Oral  SpO2: 95%  98% 100%  Weight:      Height:       General exam: Appears calm and comfortable  Respiratory system: Clear to auscultation. Respiratory effort normal. Cardiovascular system: S1 & S2 heard, RRR. No JVD,  No pedal edema. Gastrointestinal system: Abdomen is soft with generalized tenderness. Normal bowel sounds heard. Central nervous system: Alert and oriented. No focal neurological deficits. Extremities: Symmetric 5 x 5 power. Skin: No rashes, lesions or ulcers Psychiatry:  Mood & affect  appropriate.   Data Reviewed: Results for orders placed or performed during the hospital encounter of 09/05/22 (from the past 24 hour(s))  Lipase, blood     Status: Abnormal   Collection Time: 09/05/22  9:20 PM  Result Value Ref Range   Lipase 2,486 (H) 11 - 51 U/L  Comprehensive metabolic panel     Status: Abnormal   Collection Time: 09/05/22  9:20 PM  Result Value Ref Range   Sodium 138 135 - 145 mmol/L   Potassium 3.8 3.5 - 5.1 mmol/L   Chloride 104 98 - 111 mmol/L   CO2 26 22 - 32 mmol/L   Glucose, Bld 110 (H) 70 - 99 mg/dL   BUN 12 6 - 20 mg/dL   Creatinine, Ser 0.93 0.61 - 1.24 mg/dL   Calcium 8.8 (L) 8.9 - 10.3 mg/dL   Total Protein 7.5 6.5 - 8.1 g/dL   Albumin 4.0 3.5 - 5.0 g/dL   AST 21 15 - 41 U/L   ALT 18 0 - 44 U/L   Alkaline Phosphatase 73 38 - 126 U/L   Total Bilirubin 0.6 0.3 - 1.2 mg/dL   GFR, Estimated >60 >60 mL/min   Anion gap 8 5 - 15  CBC     Status: None   Collection Time: 09/05/22  9:20 PM  Result Value Ref Range   WBC 7.6 4.0 - 10.5 K/uL   RBC 4.90 4.22 - 5.81 MIL/uL   Hemoglobin 14.5 13.0 - 17.0 g/dL   HCT 41.9 39.0 - 52.0 %   MCV 85.5 80.0 - 100.0 fL   MCH 29.6 26.0 - 34.0 pg   MCHC 34.6 30.0 - 36.0 g/dL   RDW 13.8 11.5 - 15.5 %   Platelets 215 150 - 400 K/uL   nRBC 0.0 0.0 - 0.2 %  Ethanol     Status: None   Collection Time: 09/05/22 11:50 PM  Result Value Ref Range   Alcohol, Ethyl (B) <10 <10 mg/dL  Urinalysis, Routine w reflex microscopic Urine, Clean Catch     Status: None   Collection Time: 09/06/22 12:39 AM  Result Value Ref Range   Color, Urine YELLOW YELLOW   APPearance CLEAR CLEAR   Specific Gravity, Urine 1.015 1.005 - 1.030   pH 7.0 5.0 - 8.0   Glucose, UA NEGATIVE NEGATIVE mg/dL   Hgb urine dipstick NEGATIVE NEGATIVE   Bilirubin Urine NEGATIVE NEGATIVE   Ketones, ur NEGATIVE NEGATIVE mg/dL   Protein, ur NEGATIVE NEGATIVE mg/dL   Nitrite NEGATIVE NEGATIVE   Leukocytes,Ua NEGATIVE NEGATIVE  Triglycerides     Status: None    Collection Time: 09/06/22  6:26 PM  Result Value Ref Range   Triglycerides 67 <150 mg/dL     Assessment and Plan:  Acute recurrent Pancreatitis:  - unclear etiology. This is fifth admission this year for similar complaints.  - Triglycerides are wnl.  - alcohol level neg.  - liver enzymes wnl.  - trend lipase levels.  CT  abd and pelvis shows Recurrent acute interstitial/edematous pancreatitis. Stable pseudocyst .  In view of recurrent pancreatitis of unclear etiology  esp the last 3 episodes, recommend calling GI in the morning for further evaluation.  He would need a repeat MRI abdomen or EUS when his pancreatitis resolves.  NPO, IV fluids, IV pain control.   Stable intrapancreatic pseudocyst:  On CT abd and pelvis.   Hypertension:  BP parameters are elevated probably from pain.  Restart his home meds.    Bradycardia:  EKG shows NSR.     Advance Care Planning: full code.   Consults: none.   Family Communication: none.   Severity of Illness: The appropriate patient status for this patient is OBSERVATION. Observation status is judged to be reasonable and necessary in order to provide the required intensity of service to ensure the patient's safety. The patient's presenting symptoms, physical exam findings, and initial radiographic and laboratory data in the context of their medical condition is felt to place them at decreased risk for further clinical deterioration. Furthermore, it is anticipated that the patient will be medically stable for discharge from the hospital within 2 midnights of admission.   Author: Hosie Poisson, MD 09/06/2022 5:54 PM  For on call review [email or web address].

## 2022-09-06 NOTE — ED Notes (Signed)
Phone Handoff Report provided to [employer], spoke with [employer]

## 2022-09-06 NOTE — ED Notes (Signed)
Secure handoff report given to receiving RN

## 2022-09-07 DIAGNOSIS — Z8249 Family history of ischemic heart disease and other diseases of the circulatory system: Secondary | ICD-10-CM | POA: Diagnosis not present

## 2022-09-07 DIAGNOSIS — K862 Cyst of pancreas: Secondary | ICD-10-CM | POA: Diagnosis not present

## 2022-09-07 DIAGNOSIS — F17290 Nicotine dependence, other tobacco product, uncomplicated: Secondary | ICD-10-CM | POA: Diagnosis present

## 2022-09-07 DIAGNOSIS — K219 Gastro-esophageal reflux disease without esophagitis: Secondary | ICD-10-CM | POA: Diagnosis present

## 2022-09-07 DIAGNOSIS — Z803 Family history of malignant neoplasm of breast: Secondary | ICD-10-CM | POA: Diagnosis not present

## 2022-09-07 DIAGNOSIS — I10 Essential (primary) hypertension: Secondary | ICD-10-CM | POA: Diagnosis present

## 2022-09-07 DIAGNOSIS — K859 Acute pancreatitis without necrosis or infection, unspecified: Principal | ICD-10-CM

## 2022-09-07 DIAGNOSIS — R001 Bradycardia, unspecified: Secondary | ICD-10-CM | POA: Diagnosis present

## 2022-09-07 DIAGNOSIS — Z833 Family history of diabetes mellitus: Secondary | ICD-10-CM | POA: Diagnosis not present

## 2022-09-07 DIAGNOSIS — K863 Pseudocyst of pancreas: Secondary | ICD-10-CM | POA: Diagnosis present

## 2022-09-07 DIAGNOSIS — E876 Hypokalemia: Secondary | ICD-10-CM | POA: Diagnosis present

## 2022-09-07 DIAGNOSIS — Z79899 Other long term (current) drug therapy: Secondary | ICD-10-CM | POA: Diagnosis not present

## 2022-09-07 DIAGNOSIS — Z91030 Bee allergy status: Secondary | ICD-10-CM | POA: Diagnosis not present

## 2022-09-07 LAB — CBC WITH DIFFERENTIAL/PLATELET
Abs Immature Granulocytes: 0 10*3/uL (ref 0.00–0.07)
Basophils Absolute: 0 10*3/uL (ref 0.0–0.1)
Basophils Relative: 0 %
Eosinophils Absolute: 0.1 10*3/uL (ref 0.0–0.5)
Eosinophils Relative: 2 %
HCT: 39.6 % (ref 39.0–52.0)
Hemoglobin: 12.8 g/dL — ABNORMAL LOW (ref 13.0–17.0)
Immature Granulocytes: 0 %
Lymphocytes Relative: 42 %
Lymphs Abs: 2.2 10*3/uL (ref 0.7–4.0)
MCH: 28.6 pg (ref 26.0–34.0)
MCHC: 32.3 g/dL (ref 30.0–36.0)
MCV: 88.6 fL (ref 80.0–100.0)
Monocytes Absolute: 0.6 10*3/uL (ref 0.1–1.0)
Monocytes Relative: 12 %
Neutro Abs: 2.4 10*3/uL (ref 1.7–7.7)
Neutrophils Relative %: 44 %
Platelets: 180 10*3/uL (ref 150–400)
RBC: 4.47 MIL/uL (ref 4.22–5.81)
RDW: 13.8 % (ref 11.5–15.5)
WBC: 5.3 10*3/uL (ref 4.0–10.5)
nRBC: 0 % (ref 0.0–0.2)

## 2022-09-07 LAB — BASIC METABOLIC PANEL
Anion gap: 6 (ref 5–15)
BUN: 7 mg/dL (ref 6–20)
CO2: 26 mmol/L (ref 22–32)
Calcium: 8.7 mg/dL — ABNORMAL LOW (ref 8.9–10.3)
Chloride: 108 mmol/L (ref 98–111)
Creatinine, Ser: 0.79 mg/dL (ref 0.61–1.24)
GFR, Estimated: >60 mL/min (ref >60)
Glucose, Bld: 83 mg/dL (ref 70–99)
Potassium: 3.4 mmol/L — ABNORMAL LOW (ref 3.5–5.1)
Sodium: 140 mmol/L (ref 135–145)

## 2022-09-07 LAB — RAPID URINE DRUG SCREEN, HOSP PERFORMED
Amphetamines: NOT DETECTED
Barbiturates: NOT DETECTED
Benzodiazepines: NOT DETECTED
Cocaine: NOT DETECTED
Opiates: NOT DETECTED
Tetrahydrocannabinol: NOT DETECTED

## 2022-09-07 LAB — HEPATIC FUNCTION PANEL
ALT: 14 U/L (ref 0–44)
AST: 18 U/L (ref 15–41)
Albumin: 3.4 g/dL — ABNORMAL LOW (ref 3.5–5.0)
Alkaline Phosphatase: 60 U/L (ref 38–126)
Bilirubin, Direct: 0.1 mg/dL (ref 0.0–0.2)
Indirect Bilirubin: 0.5 mg/dL (ref 0.3–0.9)
Total Bilirubin: 0.6 mg/dL (ref 0.3–1.2)
Total Protein: 6.6 g/dL (ref 6.5–8.1)

## 2022-09-07 LAB — LIPASE, BLOOD: Lipase: 748 U/L — ABNORMAL HIGH (ref 11–51)

## 2022-09-07 NOTE — Assessment & Plan Note (Signed)
-   Stable intrapancreatic pseudocyst. -Need outpatient follow-up

## 2022-09-07 NOTE — Assessment & Plan Note (Signed)
Currently within normal sinus rhythm. -Continue to monitor

## 2022-09-07 NOTE — Hospital Course (Addendum)
Taken from H&P.  Max Ayers is a 51 y.o. male with medical history significant of recurrent pancreatitis, hypertension, presents to ED with nausea, persistent abdominal pain since 1 day,. He reports the last time he had alcohol was in June. He denies fever or chills, syncope. He denies any vomiting , diarrhea, fever, chills, chest pain, syncope, headache, dizziness, urinary symptoms, he reports using vaping. He also reports occasional sob on exertion. He denies sick contacts.  On arrival to ED, he was afebrile, bradycardic, hypertensive with bp190/80 mmhg.  Labs significant for 2486, triglycerides 67.  UA IS NEG. Ethyl alcohol is negative CT abd and pelvis Recurrent acute interstitial/edematous pancreatitis. No evidence of pancreatic or peripancreatic necrosis. No loculated peripancreatic fluid collections. No ascites. 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  9/27: Significant improvement in lipase to 748.  Mild hypokalemia with potassium of 3.4.  CBC pretty much unremarkable. Patient denies any alcohol use since June.  Had 3 reoccurrence and following up with GI at [facility].  Missed his follow-up appointment yesterday due to current hospitalization. Nausea and vomiting improved.  Started on clear liquid diet.  9/28: Worsening pain with any p.o. intake.  Currently on full liquid. Nausea and vomiting improved with worsening pain now radiating to the back.  Repeat CT abdomen ordered.

## 2022-09-07 NOTE — Assessment & Plan Note (Signed)
Nicotine patch as needed °

## 2022-09-07 NOTE — Progress Notes (Signed)
  Progress Note   Patient: Max Ayers INO:[id number] DOB: [birth year] DOA: 09/05/2022     0 DOS: the patient was seen and examined on 09/07/2022   Brief hospital course: Taken from H&P.  Loris Winrow is a 51 y.o. male with medical history significant of recurrent pancreatitis, hypertension, presents to ED with nausea, persistent abdominal pain since 1 day,. He reports the last time he had alcohol was in June. He denies fever or chills, syncope. He denies any vomiting , diarrhea, fever, chills, chest pain, syncope, headache, dizziness, urinary symptoms, he reports using vaping. He also reports occasional sob on exertion. He denies sick contacts.  On arrival to ED, he was afebrile, bradycardic, hypertensive with bp190/80 mmhg.  Labs significant for 2486, triglycerides 67.  UA IS NEG. Ethyl alcohol is negative CT abd and pelvis Recurrent acute interstitial/edematous pancreatitis. No evidence of pancreatic or peripancreatic necrosis. No loculated peripancreatic fluid collections. No ascites. 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  9/27: Significant improvement in lipase to 748.  Mild hypokalemia with potassium of 3.4.  CBC pretty much unremarkable. Patient denies any alcohol use since June.  Had 3 reoccurrence and following up with GI at [facility].  Missed his follow-up appointment yesterday due to current hospitalization. Nausea and vomiting improved.  Started on clear liquid diet.   Assessment and Plan: * Acute pancreatitis Multiple recurrent pancreatitis.  Stopped drinking for the past few month, last drink was in June. Need outpatient GI follow-up. Wants to follow-up with [facility] and missed his appointment. Nausea, vomiting and pain improving. -Start him on clear liquid- advance as tolerated -Continue with supportive care  Pancreatic cyst - Stable intrapancreatic pseudocyst. -Need outpatient follow-up  Benign essential HTN Currently blood pressure within  goal. -Continue home amlodipine  Tobacco use - Nicotine patch as needed  Sinus bradycardia Currently within normal sinus rhythm. -Continue to monitor   Subjective: Patient was seen and examined today.  No more nausea and vomiting.  Abdominal pain improving.  Wants to try some food.  Physical Exam: Vitals:   09/06/22 2117 09/07/22 0136 09/07/22 0521 09/07/22 1335  BP: (!) 163/96 (!) 158/90 (!) 148/83 125/78  Pulse: 86 88 74 67  Resp: '18 18 18 20  '$ Temp: 99.1 F (37.3 C) 98.4 F (36.9 C) 98 F (36.7 C) 98.2 F (36.8 C)  TempSrc: Oral Oral Oral Oral  SpO2: 92% 98% 98% 99%  Weight:      Height:       General.  Well-developed gentleman in no acute distress. Pulmonary.  Lungs clear bilaterally, normal respiratory effort. CV.  Regular rate and rhythm, no JVD, rub or murmur. Abdomen.  Soft, mild epigastric and LUQ tenderness, nondistended, BS positive. CNS.  Alert and oriented .  No focal neurologic deficit. Extremities.  No edema, no cyanosis, pulses intact and symmetrical. Psychiatry.  Judgment and insight appears normal.  Data Reviewed: Prior data reviewed  Family Communication: Discussed with patient  Disposition: Status is: Inpatient Remains inpatient appropriate because: Severity of illness   Planned Discharge Destination: Home  DVT prophylaxis.  Lovenox Time spent: 45 minutes  This record has been created using Systems analyst. Errors have been sought and corrected,but may not always be located. Such creation errors do not reflect on the standard of care.  Author: Lorella Nimrod, MD 09/07/2022 4:42 PM  For on call review [email or web address].

## 2022-09-07 NOTE — Progress Notes (Signed)
Mobility Specialist - Progress Note   09/07/22 0956  Mobility  Activity Ambulated with assistance in hallway  Level of Assistance Independent after set-up  Assistive Device None (IV pole)  Distance Ambulated (ft) 500 ft  Activity Response Tolerated well  $Mobility charge 1 Mobility   Pt received in bed and agreed for mobility, pain in abdomen, started at 7/10 got to an 8/10. Pt back to bed with all needs met.   Roderick Pee Mobility Specialist

## 2022-09-07 NOTE — Assessment & Plan Note (Signed)
Currently blood pressure within goal. -Continue home amlodipine

## 2022-09-07 NOTE — Assessment & Plan Note (Signed)
Multiple recurrent pancreatitis.  Stopped drinking for the past few month, last drink was in June. Need outpatient GI follow-up. Wants to follow-up with [facility] and missed his appointment. Nausea, vomiting and pain improving. -Start him on clear liquid- advance as tolerated -Continue with supportive care

## 2022-09-08 ENCOUNTER — Inpatient Hospital Stay (HOSPITAL_COMMUNITY): Payer: Medicaid Other

## 2022-09-08 DIAGNOSIS — K859 Acute pancreatitis without necrosis or infection, unspecified: Secondary | ICD-10-CM | POA: Diagnosis not present

## 2022-09-08 MED ORDER — IOHEXOL 9 MG/ML PO SOLN
ORAL | Status: AC
  Administered 2022-09-08: 500 mL
  Filled 2022-09-08: qty 1000

## 2022-09-08 MED ORDER — POLYETHYLENE GLYCOL 3350 17 G PO PACK
17.0000 g | PACK | Freq: Two times a day (BID) | ORAL | Status: DC | PRN
  Administered 2022-09-08: 17 g via ORAL
  Filled 2022-09-08: qty 1

## 2022-09-08 MED ORDER — PANTOPRAZOLE SODIUM 40 MG PO TBEC
40.0000 mg | DELAYED_RELEASE_TABLET | Freq: Every day | ORAL | Status: DC
  Administered 2022-09-08 – 2022-09-10 (×3): 40 mg via ORAL
  Filled 2022-09-08 (×3): qty 1

## 2022-09-08 NOTE — Progress Notes (Signed)
Mobility Specialist - Progress Note   09/08/22 0906  Mobility  Activity Ambulated with assistance in hallway  Level of Assistance Independent after set-up  Assistive Device None (IV pole)  Distance Ambulated (ft) 300 ft  Activity Response Tolerated well  $Mobility charge 1 Mobility   Pt received in bed and agreed for mobility, no c/o pain nor discomfort. Pt to bed with all needs met and family in room.   Roderick Pee Mobility Specialist

## 2022-09-08 NOTE — Progress Notes (Signed)
Progress Note   Patient: Max Ayers VOZ:[id number] DOB: 10/06/1971 DOA: 09/05/2022     1 DOS: the patient was seen and examined on 09/08/2022   Brief hospital course: Taken from H&P.  Max Ayers is a 51 y.o. male with medical history significant of recurrent pancreatitis, hypertension, presents to ED with nausea, persistent abdominal pain since 1 day,. He reports the last time he had alcohol was in June. He denies fever or chills, syncope. He denies any vomiting , diarrhea, fever, chills, chest pain, syncope, headache, dizziness, urinary symptoms, he reports using vaping. He also reports occasional sob on exertion. He denies sick contacts.  On arrival to ED, he was afebrile, bradycardic, hypertensive with bp190/80 mmhg.  Labs significant for 2486, triglycerides 67.  UA IS NEG. Ethyl alcohol is negative CT abd and pelvis Recurrent acute interstitial/edematous pancreatitis. No evidence of pancreatic or peripancreatic necrosis. No loculated peripancreatic fluid collections. No ascites. 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  9/27: Significant improvement in lipase to 748.  Mild hypokalemia with potassium of 3.4.  CBC pretty much unremarkable. Patient denies any alcohol use since June.  Had 3 reoccurrence and following up with GI at [facility].  Missed his follow-up appointment yesterday due to current hospitalization. Nausea and vomiting improved.  Started on clear liquid diet.  9/28: Worsening pain with any p.o. intake.  Currently on full liquid. Nausea and vomiting improved with worsening pain now radiating to the back.  Repeat CT abdomen ordered.   Assessment and Plan: * Acute pancreatitis Worsening pain now radiating to the back. Multiple recurrent pancreatitis.  Stopped drinking for the past few month, last drink was in June. Need outpatient GI follow-up. Wants to follow-up with [facility] and missed his appointment. Nausea, vomiting improving. -Repeat CT  abdomen. -Continue with full liquid as tolerated -Continue with pain management -Continue with supportive care  Pancreatic cyst - Stable intrapancreatic pseudocyst. -Need outpatient follow-up  Benign essential HTN Currently blood pressure within goal. -Continue home amlodipine  Tobacco use - Nicotine patch as needed  Sinus bradycardia Currently within normal sinus rhythm. -Continue to monitor   Subjective: Patient was complaining of worsening abdominal pain now radiating to the back.  Pain increases each time he was trying to drink or eat something. No nausea or vomiting.  Physical Exam: Vitals:   09/07/22 0521 09/07/22 1335 09/07/22 2123 09/08/22 0445  BP: (!) 148/83 125/78 (!) 155/89 136/70  Pulse: 74 67 68 64  Resp: '18 20 18 20  '$ Temp: 98 F (36.7 C) 98.2 F (36.8 C) 98 F (36.7 C) 98 F (36.7 C)  TempSrc: Oral Oral    SpO2: 98% 99% 96% 96%  Weight:      Height:       General.  Well-developed gentleman, in no acute distress. Pulmonary.  Lungs clear bilaterally, normal respiratory effort. CV.  Regular rate and rhythm, no JVD, rub or murmur. Abdomen.  Soft, tenderness along right and left upper quadrant and epigastrium, nondistended, BS positive. CNS.  Alert and oriented .  No focal neurologic deficit. Extremities.  No edema, no cyanosis, pulses intact and symmetrical. Psychiatry.  Judgment and insight appears normal.  Data Reviewed: Prior data reviewed  Family Communication: Discussed with wife at bedside  Disposition: Status is: Inpatient Remains inpatient appropriate because: Severity of illness   Planned Discharge Destination: Home  DVT prophylaxis.  Lovenox Time spent: 43 minutes  This record has been created using Systems analyst. Errors have been sought and corrected,but  may not always be located. Such creation errors do not reflect on the standard of care.  Author: Lorella Nimrod, MD 09/08/2022 10:01 AM  For on call review  [email or web address].

## 2022-09-08 NOTE — Assessment & Plan Note (Signed)
Worsening pain now radiating to the back. Multiple recurrent pancreatitis.  Stopped drinking for the past few month, last drink was in June. Need outpatient GI follow-up. Wants to follow-up with [facility] and missed his appointment. Nausea, vomiting improving. -Repeat CT abdomen. -Continue with full liquid as tolerated -Continue with pain management -Continue with supportive care

## 2022-09-08 NOTE — Assessment & Plan Note (Signed)
-   Stable intrapancreatic pseudocyst. -Need outpatient follow-up

## 2022-09-08 NOTE — Progress Notes (Signed)
  Transition of Care [facility]) Screening Note   Patient Details  Name: Kenshawn Maciolek Date of Birth: 01-15-1971   Transition of Care [facility]) CM/SW Contact:    Vassie Moselle, LCSW Phone Number: 09/08/2022, 12:46 PM    Transition of Care Department [facility]) has reviewed patient and no TOC needs have been identified at this time. We will continue to monitor patient advancement through interdisciplinary progression rounds. If new patient transition needs arise, please place a TOC consult.

## 2022-09-09 DIAGNOSIS — K859 Acute pancreatitis without necrosis or infection, unspecified: Secondary | ICD-10-CM | POA: Diagnosis not present

## 2022-09-09 LAB — BASIC METABOLIC PANEL
Anion gap: 4 — ABNORMAL LOW (ref 5–15)
BUN: <5 mg/dL — ABNORMAL LOW (ref 6–20)
CO2: 27 mmol/L (ref 22–32)
Calcium: 8.9 mg/dL (ref 8.9–10.3)
Chloride: 108 mmol/L (ref 98–111)
Creatinine, Ser: 0.94 mg/dL (ref 0.61–1.24)
GFR, Estimated: >60 mL/min (ref >60)
Glucose, Bld: 130 mg/dL — ABNORMAL HIGH (ref 70–99)
Potassium: 3.5 mmol/L (ref 3.5–5.1)
Sodium: 139 mmol/L (ref 135–145)

## 2022-09-09 LAB — PHOSPHORUS: Phosphorus: 2.9 mg/dL (ref 2.5–4.6)

## 2022-09-09 LAB — MAGNESIUM: Magnesium: 1.8 mg/dL (ref 1.7–2.4)

## 2022-09-09 LAB — LIPASE, BLOOD: Lipase: 50 U/L (ref 11–51)

## 2022-09-09 NOTE — Progress Notes (Signed)
Mobility Specialist - Progress Note   09/09/22 0918  Mobility  Activity Ambulated with assistance in hallway  Level of Assistance Independent after set-up  Assistive Device None (IV pole)  Distance Ambulated (ft) 500 ft  Activity Response Tolerated well  $Mobility charge 1 Mobility   Pt received in bed and agreed for mobility, no c/o pain nor discomfort during ambulation. Pt back to bed at EOS with all needs met and family in room.  Roderick Pee Mobility Specialist

## 2022-09-09 NOTE — Progress Notes (Signed)
  Progress Note   Patient: Max Ayers WGN:[id number] DOB: 05-13-1971 DOA: 09/05/2022     2 DOS: the patient was seen and examined on 09/09/2022   Brief hospital course: Taken from H&P.  Saeed Toren is a 51 y.o. male with medical history significant of recurrent pancreatitis, hypertension, presents to ED with nausea, persistent abdominal pain since 1 day,. He reports the last time he had alcohol was in June. He denies fever or chills, syncope. He denies any vomiting , diarrhea, fever, chills, chest pain, syncope, headache, dizziness, urinary symptoms, he reports using vaping. He also reports occasional sob on exertion. He denies sick contacts.  On arrival to ED, he was afebrile, bradycardic, hypertensive with bp190/80 mmhg.  Labs significant for 2486, triglycerides 67.  UA IS NEG. Ethyl alcohol is negative CT abd and pelvis Recurrent acute interstitial/edematous pancreatitis. No evidence of pancreatic or peripancreatic necrosis. No loculated peripancreatic fluid collections. No ascites. 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  9/27: Significant improvement in lipase to 748.  Mild hypokalemia with potassium of 3.4.  CBC pretty much unremarkable. Patient denies any alcohol use since June.  Had 3 reoccurrence and following up with GI at [facility].  Missed his follow-up appointment yesterday due to current hospitalization. Nausea and vomiting improved.  Started on clear liquid diet.  9/28: Worsening pain with any p.o. intake.  Currently on full liquid. Nausea and vomiting improved with worsening pain now radiating to the back.  Repeat CT abdomen ordered.  9/29: Seen and examined at bedside.  He reports 7 out of 10 pain in his epigastric region.  Received 1 mg of IV Dilaudid, improvement of pain to 5 out of 10, an hour later.  No nausea or vomiting at this time.  Advancing diet to soft and repeating lipase level.  Assessment and Plan:  Acute pancreatitis, POA Tolerated  full liquid diet, advancing to soft on 01/09/2019 Repeating lipase level Continue IV fluid hydration and analgesics Continue supportive care  Pancreatic cyst Stable intrapancreatic pseudocyst Needs outpatient follow-up  Former tobacco user States he quit 2 years ago  Sinus bradycardia Avoid AV nodal blocking agent.  Physical Exam: Vitals:   09/08/22 0445 09/08/22 1343 09/08/22 2019 09/09/22 0503  BP: 136/70 (!) 157/89 (!) 159/83 (!) 170/64  Pulse: 64 62 61 (!) 43  Resp: '20 20 14 14  '$ Temp: 98 F (36.7 C) 98 F (36.7 C) 98.2 F (36.8 C) 97.7 F (36.5 C)  TempSrc:   Oral Oral  SpO2: 96% 99% 98% 100%  Weight:      Height:       General.  Well-developed well-nourished innocuously. Pulmonary.  Clear to auscultation CV.  Regular rate and rhythm no rubs Abdomen.  Epigastric tenderness with bowel sounds present CNS.  Alert and oriented .  No focal neurologic deficit. Extremities.  No edema Psychiatry.  Mood is appropriate for condition and setting.  Data Reviewed: Prior data reviewed  Family Communication: Discussed with wife at bedside  Disposition: Status is: Inpatient Remains inpatient appropriate because: Severity of illness  Planned Discharge Destination: Home  DVT prophylaxis.  Lovenox SQ daily Time spent: 43 minutes  This record has been created using Systems analyst. Errors have been sought and corrected,but may not always be located. Such creation errors do not reflect on the standard of care.  Author: Kayleen Memos, DO 09/09/2022 2:14 PM  For on call review [email or web address].

## 2022-09-10 DIAGNOSIS — K862 Cyst of pancreas: Secondary | ICD-10-CM

## 2022-09-10 DIAGNOSIS — K859 Acute pancreatitis without necrosis or infection, unspecified: Secondary | ICD-10-CM | POA: Diagnosis not present

## 2022-09-10 DIAGNOSIS — Z720 Tobacco use: Secondary | ICD-10-CM

## 2022-09-10 DIAGNOSIS — R001 Bradycardia, unspecified: Secondary | ICD-10-CM

## 2022-09-10 DIAGNOSIS — I10 Essential (primary) hypertension: Secondary | ICD-10-CM | POA: Diagnosis not present

## 2022-09-10 LAB — BASIC METABOLIC PANEL
Anion gap: 8 (ref 5–15)
BUN: 9 mg/dL (ref 6–20)
CO2: 23 mmol/L (ref 22–32)
Calcium: 8.5 mg/dL — ABNORMAL LOW (ref 8.9–10.3)
Chloride: 108 mmol/L (ref 98–111)
Creatinine, Ser: 0.97 mg/dL (ref 0.61–1.24)
GFR, Estimated: >60 mL/min (ref >60)
Glucose, Bld: 84 mg/dL (ref 70–99)
Potassium: 3.8 mmol/L (ref 3.5–5.1)
Sodium: 139 mmol/L (ref 135–145)

## 2022-09-10 LAB — GLUCOSE, CAPILLARY: Glucose-Capillary: 86 mg/dL (ref 70–99)

## 2022-09-10 LAB — LIPASE, BLOOD: Lipase: 48 U/L (ref 11–51)

## 2022-09-10 MED ORDER — HYDROMORPHONE HCL 1 MG/ML IJ SOLN
1.0000 mg | INTRAMUSCULAR | Status: DC | PRN
  Administered 2022-09-10 – 2022-09-11 (×4): 1 mg via INTRAVENOUS
  Filled 2022-09-10 (×4): qty 1

## 2022-09-10 NOTE — Progress Notes (Signed)
PROGRESS NOTE    Max Ayers  UYQ:[id number] DOB: Feb 22, 1971 DOA: 09/05/2022 PCP: Willeen Niece, PA    Brief Narrative:   Max Ayers is a 51 y.o. male with past medical history significant for recurrent pancreatitis, essential hypertension, tobacco abuse with vaping, who presented to [facility] on 9/25 with complaints of abdominal pain.  Patient reports onset in his lower abdomen with now spread to his epigastric region described as a burning sensation.  Patient reports pain radiation towards his back.  Denies alcohol use in the last 3 months.  Also associates with dyspnea, nausea/vomiting.  Denies fever/chills, no chest pain, no palpitations, no cough/congestion, no diarrhea, no constipation.  In the ED, patient was afebrile, bradycardic and hypertensive with BP 190/80.  Labs significant for lipase 2486 and triglycerides 67.  Urinalysis unrevealing, EtOH level less than 10.  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  Given his intractable nausea/vomiting with findings concerning for recurrent pancreatitis, EDP consulted [facility] and patient was transferred to [facility] for further evaluation and management.  Assessment & Plan:   Recurrent pancreatitis Patient presenting to the ED with recurrent abdominal pain associate with nausea and vomiting.  Elevated lipase of 2486.  Triglycerides 67.  Denies any recent alcohol use, abstinent over the last 3 months.  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  Outpatient follow-up with GI.  Essential hypertension --Amlodipine 5 mg p.o. daily  GERD: Continue PPI  Sinus bradycardia Avoid AV nodal blocking agents  Tobacco use disorder with vaping Counseled on need for complete cessation   DVT prophylaxis: enoxaparin (LOVENOX) injection 40 mg Start: 09/06/22 2200    Code Status: Full Code Family Communication: No family present at bedside this morning  Disposition Plan:  Level of care: Med-Surg Status is: Inpatient Remains inpatient appropriate because: Needs to ensure tolerating diet before safe discharge, anticipate discharge in 1 day    Consultants:  None  Procedures:  None  Antimicrobials:  None   Subjective: Patient seen examined bedside, resting calmly.  Recently advance to soft diet.  Continues with mild nausea, received IV Zofran this morning.  Feels slightly still uneasy with abdominal discomfort.  Lipase now within normal limits.  We will continue IV fluids, pain control, antiemetics as needed to ensure tolerates diet over the next 24 hours before discharge home.  If continues with symptoms, will consider initiation of pancreatic enzymes.  No other questions or concerns at this time.  Denies headache, no dizziness, no chest pain, no palpitation, no fever/chills/night sweats, no current vomiting/diarrhea, no focal weakness, no fatigue, no cough/congestion, no paresthesias.  No acute events overnight per nursing staff.  Objective: Vitals:   09/09/22 1429 09/09/22 1940 09/10/22 0440 09/10/22 1343  BP: (!) 141/77 133/80  134/68 132/76  Pulse: (!) 50 (!) 48 60 (!) 48  Resp: 17 16  16 18  Temp: 97.7 F (36.5 C) 98.3 F (36.8 C) 98.1 F (36.7 C) 98.3 F (36.8 C)  TempSrc: Oral Oral  Oral  SpO2: 100% 100% 98% 98%  Weight:      Height:        Intake/Output Summary (Last 24 hours) at 09/10/2022 1350 Last data filed at 09/10/2022 1300 Gross per 24 hour  Intake 360 ml  Output 1 ml  Net 359 ml   Filed Weights   09/05/22 2116  Weight: 88.5 kg    Examination:  Physical Exam: GEN: NAD, alert and oriented x 3, wd/wn HEENT: NCAT, PERRL, EOMI, sclera clear, MMM PULM: CTAB w/o wheezes/crackles, normal respiratory effort CV: RRR w/o M/G/R GI: abd soft, nondistended, mild epigastric tenderness, NABS, no R/G/M MSK: no peripheral edema, muscle strength globally intact 5/5 bilateral upper/lower extremities NEURO: CN II-XII intact, no focal deficits, sensation to light touch intact PSYCH: normal mood/affect Integumentary: dry/intact, no rashes or wounds    Data Reviewed: I have personally reviewed following labs and imaging studies  CBC: Recent Labs  Lab 09/05/22 2120 09/07/22 0525  WBC 7.6 5.3  NEUTROABS  --  2.4  HGB 14.5 12.8*  HCT 41.9 39.6  MCV 85.5 88.6  PLT 215 315   Basic Metabolic Panel: Recent Labs  Lab 09/05/22 2120 09/07/22 0525 09/09/22 1317 09/10/22 0621  NA 138 140 139 139  K 3.8 3.4* 3.5 3.8  CL 104 108 108 108  CO2 '26 26 27 23  '$ GLUCOSE 110* 83 130* 84  BUN 12 7 <5* 9  CREATININE 0.93 0.79 0.94 0.97  CALCIUM 8.8* 8.7* 8.9 8.5*  MG  --   --  1.8  --   PHOS  --   --  2.9  --    GFR: Estimated Creatinine Clearance: 98.1 mL/min (by C-G formula based on SCr of 0.97 mg/dL). Liver Function Tests: Recent Labs  Lab 09/05/22 2120 09/07/22 0525  AST 21 18  ALT 18 14  ALKPHOS 73 60  BILITOT 0.6 0.6  PROT 7.5 6.6  ALBUMIN 4.0 3.4*   Recent Labs  Lab 09/05/22 2120 09/07/22 0525 09/09/22 1317 09/10/22 0621  LIPASE 2,486* 748* 50 48   No results for input(s):  "AMMONIA" in the last 168 hours. Coagulation Profile: No results for input(s): "INR", "PROTIME" in the last 168 hours. Cardiac Enzymes: No results for input(s): "CKTOTAL", "CKMB", "CKMBINDEX", "TROPONINI" in the last 168 hours. BNP (last 3 results) No results for input(s): "PROBNP" in the last 8760 hours. HbA1C: No results for input(s): "HGBA1C" in the last 72 hours. CBG: Recent Labs  Lab 09/10/22 0751  GLUCAP 86   Lipid Profile: No results for input(s): "CHOL", "HDL", "LDLCALC", "TRIG", "CHOLHDL", "LDLDIRECT" in the last 72 hours. Thyroid Function Tests: No results for input(s): "TSH", "T4TOTAL", "FREET4", "T3FREE", "THYROIDAB" in the last 72 hours. Anemia Panel: No results for input(s): "VITAMINB12", "FOLATE", "FERRITIN", "TIBC", "IRON", "RETICCTPCT" in the last 72 hours. Sepsis Labs: No results for input(s): "PROCALCITON", "LATICACIDVEN" in the last 168 hours.  No results found for this or any previous visit (from the past 240 hour(s)).       Radiology Studies: No results found.      Scheduled Meds:  amLODipine  5 mg Oral Daily   enoxaparin (LOVENOX) injection  40 mg Subcutaneous Q24H   pantoprazole  40 mg Oral QHS   Continuous Infusions:  sodium chloride 75 mL/hr at 09/10/22 0918     LOS: 3 days    Time spent:  47 minutes spent on chart review, discussion with nursing staff, consultants, updating family and interview/physical exam; more than 50% of that time was spent in counseling and/or coordination of care.    Deven J [place] DO [facility] Available via Epic secure chat 7am-7pm After these hours, please refer to coverage provider listed on [email or web address] 09/10/2022, 1:50 PM

## 2022-09-10 NOTE — Progress Notes (Signed)
  Transition of Care [facility]) Screening Note   Patient Details  Name: Diaz Crago Date of Birth: September 10, 1971   Transition of Care [facility]) CM/SW Contact:    Kimber Relic, LCSW Phone Number: 09/10/2022, 4:24 PM    Transition of Care Department [facility]) has reviewed patient and no TOC needs have been identified at this time. We will continue to monitor patient advancement through interdisciplinary progression rounds. If new patient transition needs arise, please place a TOC consult.

## 2022-09-11 DIAGNOSIS — I10 Essential (primary) hypertension: Secondary | ICD-10-CM | POA: Diagnosis not present

## 2022-09-11 DIAGNOSIS — K862 Cyst of pancreas: Secondary | ICD-10-CM | POA: Diagnosis not present

## 2022-09-11 DIAGNOSIS — R001 Bradycardia, unspecified: Secondary | ICD-10-CM | POA: Diagnosis not present

## 2022-09-11 DIAGNOSIS — K859 Acute pancreatitis without necrosis or infection, unspecified: Secondary | ICD-10-CM | POA: Diagnosis not present

## 2022-09-11 MED ORDER — ONDANSETRON HCL 4 MG PO TABS: 4.0000 mg | ORAL_TABLET | Freq: Four times a day (QID) | ORAL | 0 refills | Status: DC | PRN

## 2022-09-11 MED ORDER — OXYCODONE-ACETAMINOPHEN 5-325 MG PO TABS: 1.0000 | ORAL_TABLET | Freq: Four times a day (QID) | ORAL | 0 refills | Status: AC | PRN

## 2022-09-11 MED ORDER — PANTOPRAZOLE SODIUM 40 MG PO TBEC: 40.0000 mg | DELAYED_RELEASE_TABLET | Freq: Every day | ORAL | 2 refills | Status: DC

## 2022-09-11 NOTE — Progress Notes (Signed)
Mobility Specialist - Progress Note   09/11/22 1026  Mobility  Activity Ambulated independently in hallway (PT)  Level of Assistance Independent after set-up  Assistive Device None  Distance Ambulated (ft) 500 ft  Activity Response Tolerated well  Transport method Ambulatory;Wheelchair  $Mobility charge 1 Mobility   Pt received in bed and agreed for mobility, no c/o pain nor discomfort. Pt back to bed with all needs met.   Roderick Pee Mobility Specialist

## 2022-09-11 NOTE — Plan of Care (Signed)
  Problem: Education: Goal: Knowledge of General Education information will improve Description: Including pain rating scale, medication(s)/side effects and non-pharmacologic comfort measures Outcome: Progressing   Problem: Activity: Goal: Risk for activity intolerance will decrease Outcome: Progressing   Problem: Pain Managment: Goal: General experience of comfort will improve Outcome: Progressing   

## 2022-09-11 NOTE — Plan of Care (Signed)

## 2022-09-11 NOTE — Progress Notes (Signed)
  Transition of Care [facility]) Screening Note   Patient Details  Name: Toriano Aikey Date of Birth: Apr 26, 1971   Transition of Care [facility]) CM/SW Contact:    Roseanne Kaufman, RN Phone Number: 09/11/2022, 11:57 AM    Transition of Care Department [facility]) has reviewed patient and no TOC needs have been identified at this time. We will continue to monitor patient advancement through interdisciplinary progression rounds. If new patient transition needs arise, please place a TOC consult.

## 2022-09-11 NOTE — Discharge Summary (Signed)
Physician Discharge Summary  Max Ayers YQI:[id number] DOB: July 21, 1971 DOA: 09/05/2022  PCP: Willeen Niece, PA  Admit date: 09/05/2022 Discharge date: 09/11/2022  Admitted From: Home Disposition: Home  Recommendations for Outpatient Follow-up:  Follow up with PCP in 1-2 weeks Follow-up with [facility], Dr. Cindee Salt as scheduled Continue to encourage alcohol cessation  Home Health: No Equipment/Devices: None  Discharge Condition: Stable CODE STATUS: Full code Diet recommendation: Heart healthy diet  History of present illness:  Max Ayers is a 51 y.o. male with past medical history significant for recurrent pancreatitis, essential hypertension, tobacco abuse with vaping, who presented to [facility] on 9/25 with complaints of abdominal pain.  Patient reports onset in his lower abdomen with now spread to his epigastric region described as a burning sensation.  Patient reports pain radiation towards his back.  Denies alcohol use in the last 3 months.  Also associates with dyspnea, nausea/vomiting.  Denies fever/chills, no chest pain, no palpitations, no cough/congestion, no diarrhea, no constipation.   In the ED, patient was afebrile, bradycardic and hypertensive with BP 190/80. Labs significant for lipase 2486 and triglycerides 67.  Urinalysis unrevealing, EtOH level less than 10.  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  Given his intractable nausea/vomiting with findings concerning for recurrent pancreatitis, EDP consulted [facility] and patient was transferred to [facility] for further evaluation and management.  Hospital course:  Recurrent pancreatitis Patient presenting to the ED with recurrent abdominal pain associate with nausea and  vomiting.  Elevated lipase of 2486.  Triglycerides 67.  Denies any recent alcohol use, abstinent over the last 3 months.  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  Patient was started on IV fluid hydration, clear liquid diet and pain control with improvement.  Lipase has normalized and now tolerating advance diet.  Discussed continued alcohol cessation and patient has follow-up appointment scheduled with [facility], Dr. Cindee Salt on 09/13/2022.    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  Outpatient follow-up with GI.   Essential hypertension Continue amlodipine 5 mg p.o. daily   GERD: Continue PPI   Sinus bradycardia Avoid AV nodal blocking agents   Tobacco use disorder with vaping Counseled on need for complete cessation   Discharge Diagnoses:  Principal Problem:   Acute pancreatitis Active Problems:   Pancreatic cyst   Benign essential HTN   Tobacco use   Sinus bradycardia    Discharge Instructions  Discharge Instructions     Call MD for:  difficulty breathing, headache or visual disturbances   Complete by: As directed    Call MD for:  extreme fatigue   Complete by: As directed    Call MD for:  persistant dizziness or light-headedness   Complete by: As directed    Call MD for:  persistant nausea and vomiting   Complete by: As directed    Call MD for:  severe uncontrolled pain   Complete by: As directed    Call MD for:  temperature >100.4   Complete by: As directed    Diet - low sodium heart healthy   Complete by: As directed    Increase activity slowly   Complete by:  As directed       Allergies as of 09/11/2022       Reactions   Yellow Jacket Venom  [bee Venom] Anaphylaxis        Medication List     STOP taking these medications    polyethylene glycol 17 g packet Commonly known as: MIRALAX / GLYCOLAX   senna-docusate 8.6-50 MG tablet Commonly known as: Senokot-S       TAKE these medications    amLODipine 5 MG tablet Commonly known as: NORVASC Take 5 mg by mouth 2 (two) times a week. Wednesday and Friday   ondansetron 4 MG tablet Commonly known as: ZOFRAN Take 1 tablet (4 mg total) by mouth every 6 (six) hours as needed for nausea.   oxyCODONE-acetaminophen 5-325 MG tablet Commonly known as: Percocet Take 1 tablet by mouth every 6 (six) hours as needed for up to 5 days for moderate pain. What changed:  when to take this reasons to take this   pantoprazole 40 MG tablet Commonly known as: PROTONIX Take 1 tablet (40 mg total) by mouth at bedtime.        Follow-up Information     [place], [place]. Schedule an appointment as soon as possible for a visit in 1 week(s).   Specialty: Physician Assistant Contact information: [place] [address] [phone]         Eusebio Friendly, MD. Go on 09/13/2022.   Specialty: Internal Medicine Contact information: [address] [place] [address] [place] [address] [phone]                Allergies  Allergen Reactions   Yellow Jacket Venom [Bee Venom] Anaphylaxis    Consultations: None   Procedures/Studies: CT ABDOMEN PELVIS WO CONTRAST  Result Date: 09/08/2022 CLINICAL DATA:  Pancreatitis, worsening abdominal pain EXAM: CT ABDOMEN AND PELVIS WITHOUT CONTRAST TECHNIQUE: Multidetector CT imaging of the abdomen and pelvis was performed following the standard protocol without IV contrast. RADIATION DOSE REDUCTION: This exam was performed according to the departmental dose-optimization program which includes automated exposure control, adjustment of the mA and/or kV according to patient size and/or  use of iterative reconstruction technique. COMPARISON:  09/05/2022 FINDINGS: Lower chest: Minimal bilateral pleural effusions are seen. Increased markings are seen in the posterior lower lung fields in subpleural region. Hepatobiliary: No focal abnormalities are seen in liver. There is no dilation of bile ducts. There is subtle increased density in the lumen of gallbladder. There is no gallbladder wall thickening. Pancreas: There is mild stranding in the peripancreatic region with no significant interval change. There is 2 cm low-density in the head of the pancreas with no interval change. There is no dilation of pancreatic duct. There are no new loculated peripancreatic fluid collection. Spleen: Unremarkable. Adrenals/Urinary Tract: Adrenals are unremarkable. There is no hydronephrosis. Calcifications are seen in renal artery branches. Ureters are not dilated. Urinary bladder is unremarkable. Stomach/Bowel: Small hiatal hernia is seen. Small bowel loops are not dilated. Appendix is not dilated. There is contrast in the lumen of appendix. There is no pericecal inflammation. There is no significant wall thickening in colon. Vascular/Lymphatic: Scattered arterial calcifications are seen. There are subcentimeter nodes in mesentery and retroperitoneum with no significant interval change. Reproductive: Unremarkable. Other: There is no ascites or pneumoperitoneum. Small umbilical hernia containing fat is seen. Small bilateral inguinal hernias containing fat are noted. Musculoskeletal: No acute findings are seen. IMPRESSION: There is mild peripancreatic edema with no significant interval change suggesting  mild acute pancreatitis. 2 cm fluid density seen in the head of the pancreas appears stable. There are no new loculated fluid collections in or around pancreas. There is no definite evidence of focal pancreatic necrosis. There is no evidence of intestinal obstruction or pneumoperitoneum. Appendix is not dilated. There is  no hydronephrosis. Small hiatal hernia. There is subtle increased density in the lumen of gallbladder suggesting possible presence of sludge or tiny stones. Other findings as described in the body of the report. Electronically Signed   By: Elmer Picker M.D.   On: 09/08/2022 13:46   CT ABDOMEN PELVIS W CONTRAST  Result Date: 09/05/2022 CLINICAL DATA:  Left upper quadrant abdominal pain EXAM: CT ABDOMEN AND PELVIS WITH CONTRAST TECHNIQUE: Multidetector CT imaging of the abdomen and pelvis was performed using the standard protocol following bolus administration of intravenous contrast. RADIATION DOSE REDUCTION: This exam was performed according to the departmental dose-optimization program which includes automated exposure control, adjustment of the mA and/or kV according to patient size and/or use of iterative reconstruction technique. CONTRAST:  194m OMNIPAQUE IOHEXOL 300 MG/ML  SOLN COMPARISON:  12/13/2021 FINDINGS: Lower chest: No acute abnormality. Hepatobiliary: No focal liver abnormality is seen. No gallstones, gallbladder wall thickening, or biliary dilatation. Pancreas: There are extensive peripancreatic inflammatory changes identified in keeping with recurrent, acute interstitial/edematous pancreatitis. Normal enhancement of the pancreatic parenchyma. 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 The pancreatic duct is not dilated. No parenchymal calcifications. No loculated peripancreatic fluid collections are identified. Spleen: Unremarkable Adrenals/Urinary Tract: Adrenal glands are unremarkable. Kidneys are normal, without renal calculi, focal lesion, or hydronephrosis. Bladder is unremarkable. Stomach/Bowel: Stomach is within normal limits. Appendix appears normal. No evidence of bowel wall thickening, distention, or inflammatory changes. Vascular/Lymphatic: Aortic atherosclerosis. No enlarged abdominal or pelvic lymph nodes. Reproductive: Prostate is unremarkable.  Other: No abdominal wall hernia or abnormality. No abdominopelvic ascites. Musculoskeletal: No acute or significant osseous findings. IMPRESSION: 1. Recurrent acute interstitial/edematous pancreatitis. No evidence of pancreatic or peripancreatic necrosis. No loculated peripancreatic fluid collections. No ascites. 2. 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 Aortic Atherosclerosis (ICD10-I70.0). Electronically Signed   By: AFidela SalisburyM.D.   On: 09/05/2022 23:01   DG Chest 2 View  Result Date: 09/05/2022 CLINICAL DATA:  Shortness of breath EXAM: CHEST - 2 VIEW COMPARISON:  Chest x-ray 12/14/2021 FINDINGS: The heart size and mediastinal contours are within normal limits. Both lungs are clear. The visualized skeletal structures are unremarkable. IMPRESSION: No active cardiopulmonary disease. Electronically Signed   By: ARonney AstersM.D.   On: 09/05/2022 21:37     Subjective: Patient seen examined at bedside, resting comfortably.  No specific complaints this morning other than continued very mild nausea but tolerating diet without any vomiting.  Electrolytes stable.  Ready for discharge home.  Has outpatient follow-up with gastroenterology scheduled on 09/13/2022.  Discussed need for complete alcohol cessation.  No other questions or concerns at this time.  Denies headache, no dizziness, no chest pain, no palpitations, no fever/chills/night sweats, no nausea/vomiting/diarrhea, no focal weakness, no fatigue, no abdominal pain, no cough/congestion, no paresthesias.  No acute events overnight per nursing staff.  Discharge Exam: Vitals:   09/10/22 2302 09/11/22 0412  BP: 128/79 (!) 145/68  Pulse: 62 (!) 50  Resp: 18 18  Temp: 98 F (36.7 C) 97.8 F (36.6 C)  SpO2: 97% 100%   Vitals:   09/10/22 0440 09/10/22 1343 09/10/22 2302 09/11/22 0412  BP: 134/68 132/76 128/79 (!) 145/68  Pulse: 60 (!) 48 62 (!) 50  Resp: '16 18 18 18  '$ Temp: 98.1 F (36.7 C) 98.3 F (36.8 C) 98 F  (36.7 C) 97.8 F (36.6 C)  TempSrc:  Oral Oral Oral  SpO2: 98% 98% 97% 100%  Weight:      Height:        Physical Exam: GEN: NAD, alert and oriented x 3, wd/wn HEENT: NCAT, PERRL, EOMI, sclera clear, MMM PULM: CTAB w/o wheezes/crackles, normal respiratory effort CV: RRR w/o M/G/R GI: abd soft, NTND, NABS, no R/G/M MSK: no peripheral edema, muscle strength globally intact 5/5 bilateral upper/lower extremities NEURO: CN II-XII intact, no focal deficits, sensation to light touch intact PSYCH: normal mood/affect Integumentary: dry/intact, no rashes or wounds    The results of significant diagnostics from this hospitalization (including imaging, microbiology, ancillary and laboratory) are listed below for reference.     Microbiology: No results found for this or any previous visit (from the past 240 hour(s)).   Labs: BNP (last 3 results) No results for input(s): "BNP" in the last 8760 hours. Basic Metabolic Panel: Recent Labs  Lab 09/05/22 2120 09/07/22 0525 09/09/22 1317 09/10/22 0621  NA 138 140 139 139  K 3.8 3.4* 3.5 3.8  CL 104 108 108 108  CO2 '26 26 27 23  '$ GLUCOSE 110* 83 130* 84  BUN 12 7 <5* 9  CREATININE 0.93 0.79 0.94 0.97  CALCIUM 8.8* 8.7* 8.9 8.5*  MG  --   --  1.8  --   PHOS  --   --  2.9  --    Liver Function Tests: Recent Labs  Lab 09/05/22 2120 09/07/22 0525  AST 21 18  ALT 18 14  ALKPHOS 73 60  BILITOT 0.6 0.6  PROT 7.5 6.6  ALBUMIN 4.0 3.4*   Recent Labs  Lab 09/05/22 2120 09/07/22 0525 09/09/22 1317 09/10/22 0621  LIPASE 2,486* 748* 50 48   No results for input(s): "AMMONIA" in the last 168 hours. CBC: Recent Labs  Lab 09/05/22 2120 09/07/22 0525  WBC 7.6 5.3  NEUTROABS  --  2.4  HGB 14.5 12.8*  HCT 41.9 39.6  MCV 85.5 88.6  PLT 215 180   Cardiac Enzymes: No results for input(s): "CKTOTAL", "CKMB", "CKMBINDEX", "TROPONINI" in the last 168 hours. BNP: Invalid input(s): "POCBNP" CBG: Recent Labs  Lab 09/10/22 0751   GLUCAP 86   D-Dimer No results for input(s): "DDIMER" in the last 72 hours. Hgb A1c No results for input(s): "HGBA1C" in the last 72 hours. Lipid Profile No results for input(s): "CHOL", "HDL", "LDLCALC", "TRIG", "CHOLHDL", "LDLDIRECT" in the last 72 hours. Thyroid function studies No results for input(s): "TSH", "T4TOTAL", "T3FREE", "THYROIDAB" in the last 72 hours.  Invalid input(s): "FREET3" Anemia work up No results for input(s): "VITAMINB12", "FOLATE", "FERRITIN", "TIBC", "IRON", "RETICCTPCT" in the last 72 hours. Urinalysis    Component Value Date/Time   COLORURINE YELLOW 09/06/2022 0039   APPEARANCEUR CLEAR 09/06/2022 0039   LABSPEC 1.015 09/06/2022 0039   PHURINE 7.0 09/06/2022 0039   GLUCOSEU NEGATIVE 09/06/2022 0039   HGBUR NEGATIVE 09/06/2022 0039   BILIRUBINUR NEGATIVE 09/06/2022 0039   KETONESUR NEGATIVE 09/06/2022 0039   PROTEINUR NEGATIVE 09/06/2022 0039   UROBILINOGEN 0.2 10/27/2011 0957   NITRITE NEGATIVE 09/06/2022 0039   LEUKOCYTESUR NEGATIVE 09/06/2022 0039   Sepsis Labs Recent Labs  Lab 09/05/22 2120 09/07/22 0525  WBC 7.6 5.3   Microbiology No results found for this or any previous visit (from the past 240 hour(s)).  Time coordinating discharge: Over 30 minutes  SIGNED:   Mako J [place] DO  [facility] 09/11/2022, 10:59 AM

## 2022-09-11 NOTE — Progress Notes (Signed)
Patient discharge completed, awaiting ride.

## 2022-11-13 ENCOUNTER — Other Ambulatory Visit: Payer: Self-pay

## 2022-11-13 DIAGNOSIS — K859 Acute pancreatitis without necrosis or infection, unspecified: Secondary | ICD-10-CM | POA: Diagnosis not present

## 2022-11-13 DIAGNOSIS — R1033 Periumbilical pain: Secondary | ICD-10-CM | POA: Diagnosis present

## 2022-11-13 NOTE — ED Triage Notes (Signed)
Pt is here for upper abdominal pain which began Friday and has been increasing.  Pt reports hx of pancreatitis and this feels like pancreatitis.  Pt has not consumed any etoh

## 2022-11-14 ENCOUNTER — Encounter (HOSPITAL_BASED_OUTPATIENT_CLINIC_OR_DEPARTMENT_OTHER): Payer: Self-pay | Admitting: *Deleted

## 2022-11-14 ENCOUNTER — Encounter (HOSPITAL_COMMUNITY): Payer: Self-pay

## 2022-11-14 ENCOUNTER — Emergency Department (HOSPITAL_BASED_OUTPATIENT_CLINIC_OR_DEPARTMENT_OTHER)
Admission: EM | Admit: 2022-11-14 | Discharge: 2022-11-14 | Disposition: A | Discharge status: Home / Self Care | Payer: Medicaid Other | Attending: Emergency Medicine | Admitting: Emergency Medicine

## 2022-11-14 DIAGNOSIS — K859 Acute pancreatitis without necrosis or infection, unspecified: Secondary | ICD-10-CM

## 2022-11-14 LAB — URINALYSIS, ROUTINE W REFLEX MICROSCOPIC
Bilirubin Urine: NEGATIVE
Glucose, UA: NEGATIVE mg/dL
Hgb urine dipstick: NEGATIVE
Ketones, ur: NEGATIVE mg/dL
Leukocytes,Ua: NEGATIVE
Nitrite: NEGATIVE
Protein, ur: NEGATIVE mg/dL
Specific Gravity, Urine: >=1.03 (ref 1.005–1.030)
pH: 5.5 (ref 5.0–8.0)

## 2022-11-14 LAB — CBC
HCT: 43.2 % (ref 39.0–52.0)
Hemoglobin: 14.6 g/dL (ref 13.0–17.0)
MCH: 29 pg (ref 26.0–34.0)
MCHC: 33.8 g/dL (ref 30.0–36.0)
MCV: 85.7 fL (ref 80.0–100.0)
Platelets: 238 10*3/uL (ref 150–400)
RBC: 5.04 MIL/uL (ref 4.22–5.81)
RDW: 13.2 % (ref 11.5–15.5)
WBC: 5.7 10*3/uL (ref 4.0–10.5)
nRBC: 0 % (ref 0.0–0.2)

## 2022-11-14 LAB — COMPREHENSIVE METABOLIC PANEL
ALT: 32 U/L (ref 0–44)
AST: 26 U/L (ref 15–41)
Albumin: 4 g/dL (ref 3.5–5.0)
Alkaline Phosphatase: 75 U/L (ref 38–126)
Anion gap: 6 (ref 5–15)
BUN: 14 mg/dL (ref 6–20)
CO2: 24 mmol/L (ref 22–32)
Calcium: 9.4 mg/dL (ref 8.9–10.3)
Chloride: 108 mmol/L (ref 98–111)
Creatinine, Ser: 0.92 mg/dL (ref 0.61–1.24)
GFR, Estimated: >60 mL/min (ref >60)
Glucose, Bld: 122 mg/dL — ABNORMAL HIGH (ref 70–99)
Potassium: 3.5 mmol/L (ref 3.5–5.1)
Sodium: 138 mmol/L (ref 135–145)
Total Bilirubin: 0.6 mg/dL (ref 0.3–1.2)
Total Protein: 7.8 g/dL (ref 6.5–8.1)

## 2022-11-14 LAB — LIPASE, BLOOD: Lipase: 1339 U/L — ABNORMAL HIGH (ref 11–51)

## 2022-11-14 MED ORDER — FENTANYL CITRATE PF 50 MCG/ML IJ SOSY
50.0000 ug | PREFILLED_SYRINGE | Freq: Once | INTRAMUSCULAR | Status: AC
  Administered 2022-11-14: 50 ug via INTRAVENOUS
  Filled 2022-11-14: qty 1

## 2022-11-14 MED ORDER — SODIUM CHLORIDE 0.9 % IV SOLN: INTRAVENOUS | Status: DC

## 2022-11-14 MED ORDER — ONDANSETRON 4 MG PO TBDP
8.0000 mg | ORAL_TABLET | Freq: Once | ORAL | Status: AC
  Administered 2022-11-14: 8 mg via ORAL
  Filled 2022-11-14: qty 2

## 2022-11-14 MED ORDER — MORPHINE SULFATE (PF) 4 MG/ML IV SOLN
8.0000 mg | Freq: Once | INTRAVENOUS | Status: AC
  Administered 2022-11-14: 8 mg via INTRAVENOUS
  Filled 2022-11-14: qty 2

## 2022-11-14 MED ORDER — LACTATED RINGERS IV BOLUS
1000.0000 mL | Freq: Once | INTRAVENOUS | Status: AC
  Administered 2022-11-14: 1000 mL via INTRAVENOUS

## 2022-11-14 MED ORDER — PROMETHAZINE HCL 25 MG RE SUPP: 25.0000 mg | Freq: Four times a day (QID) | RECTAL | 0 refills | Status: DC | PRN

## 2022-11-14 MED ORDER — OXYCODONE-ACETAMINOPHEN 5-325 MG PO TABS: 1.0000 | ORAL_TABLET | Freq: Three times a day (TID) | ORAL | 0 refills | Status: DC | PRN

## 2022-11-14 MED ORDER — ONDANSETRON 8 MG PO TBDP: 8.0000 mg | ORAL_TABLET | Freq: Three times a day (TID) | ORAL | 0 refills | Status: DC | PRN

## 2022-11-14 MED ORDER — ONDANSETRON HCL 4 MG/2ML IJ SOLN
4.0000 mg | Freq: Once | INTRAMUSCULAR | Status: AC
  Administered 2022-11-14: 4 mg via INTRAVENOUS
  Filled 2022-11-14: qty 2

## 2022-11-14 MED ORDER — OXYCODONE-ACETAMINOPHEN 5-325 MG PO TABS
1.0000 | ORAL_TABLET | Freq: Four times a day (QID) | ORAL | Status: DC | PRN
  Administered 2022-11-14: 1 via ORAL
  Filled 2022-11-14: qty 1

## 2022-11-14 NOTE — Discharge Instructions (Addendum)
Please return to the ER if your symptoms worsen; you have increased pain, fevers, chills, inability to keep any medications down.

## 2022-11-14 NOTE — ED Notes (Signed)
ED Provider at bedside. 

## 2022-11-14 NOTE — ED Provider Notes (Signed)
[place] EMERGENCY DEPARTMENT Provider Note   CSN: [id number] Arrival date & time: 11/13/22  2322     History  Chief Complaint  Patient presents with   Abdominal Pain    Max Ayers is a 51 y.o. male.  The history is provided by the patient.  Abdominal Pain Pain location:  Periumbilical and R flank Pain radiates to:  Does not radiate Pain severity:  Severe Onset quality:  Gradual Duration:  4 days Timing:  Constant Progression:  Worsening Chronicity:  Recurrent Context: not alcohol use   Relieved by:  Nothing Worsened by:  Nothing Ineffective treatments:  None tried Associated symptoms: nausea   Associated symptoms: no chest pain, no constipation, no diarrhea, no dysuria, no fever and no vomiting   Risk factors: not elderly   Patient with pancreatitis presents with abdominal pain x 4 days.  Believes he ate something that may have triggered the symptoms. Patient has associated nausea.       Home Medications Prior to Admission medications   Medication Sig Start Date End Date Taking? Authorizing Provider  amLODipine (NORVASC) 5 MG tablet Take 5 mg by mouth 2 (two) times a week. Wednesday and Friday    [provider]  ondansetron (ZOFRAN) 4 MG tablet Take 1 tablet (4 mg total) by mouth every 6 (six) hours as needed for nausea. 09/11/22   [place] Rojelio J, DO  pantoprazole (PROTONIX) 40 MG tablet Take 1 tablet (40 mg total) by mouth at bedtime. 09/11/22 12/10/22  [place] Feliciano J, DO      Allergies    Yellow jacket venom [bee venom]    Review of Systems   Review of Systems  Constitutional:  Negative for fever.  HENT:  Negative for facial swelling.   Eyes:  Negative for redness.  Cardiovascular:  Negative for chest pain.  Gastrointestinal:  Positive for abdominal pain and nausea. Negative for constipation, diarrhea and vomiting.  Genitourinary:  Negative for dysuria.  All other systems reviewed and are negative.   Physical Exam Updated Vital  Signs BP (!) 124/112   Pulse (!) 54   Temp 97.7 F (36.5 C) (Oral)   Resp 12   Wt 95.3 kg   SpO2 96%   BMI 29.29 kg/m  Physical Exam Vitals and nursing note reviewed.  Constitutional:      General: He is not in acute distress.    Appearance: He is well-developed. He is not diaphoretic.  HENT:     Head: Normocephalic and atraumatic.     Nose: Nose normal.  Eyes:     Conjunctiva/sclera: Conjunctivae normal.     Pupils: Pupils are equal, round, and reactive to light.  Cardiovascular:     Rate and Rhythm: Normal rate and regular rhythm.  Pulmonary:     Effort: Pulmonary effort is normal.     Breath sounds: Normal breath sounds. No wheezing or rales.  Abdominal:     General: Bowel sounds are normal.     Palpations: Abdomen is soft.     Tenderness: There is abdominal tenderness. There is no guarding or rebound.     Hernia: No hernia is present.  Musculoskeletal:        General: Normal range of motion.     Cervical back: Normal range of motion and neck supple.  Skin:    General: Skin is warm and dry.     Capillary Refill: Capillary refill takes less than 2 seconds.  Neurological:     General: No focal deficit  present.     Mental Status: He is alert and oriented to person, place, and time.  Psychiatric:        Mood and Affect: Mood normal.        Behavior: Behavior normal.     ED Results / Procedures / Treatments   Labs (all labs ordered are listed, but only abnormal results are displayed) Results for orders placed or performed during the hospital encounter of 11/14/22  CBC  Result Value Ref Range   WBC 5.7 4.0 - 10.5 K/uL   RBC 5.04 4.22 - 5.81 MIL/uL   Hemoglobin 14.6 13.0 - 17.0 g/dL   HCT 43.2 39.0 - 52.0 %   MCV 85.7 80.0 - 100.0 fL   MCH 29.0 26.0 - 34.0 pg   MCHC 33.8 30.0 - 36.0 g/dL   RDW 13.2 11.5 - 15.5 %   Platelets 238 150 - 400 K/uL   nRBC 0.0 0.0 - 0.2 %  Urinalysis, Routine w reflex microscopic  Result Value Ref Range   Color, Urine YELLOW  YELLOW   APPearance CLEAR CLEAR   Specific Gravity, Urine >=1.030 1.005 - 1.030   pH 5.5 5.0 - 8.0   Glucose, UA NEGATIVE NEGATIVE mg/dL   Hgb urine dipstick NEGATIVE NEGATIVE   Bilirubin Urine NEGATIVE NEGATIVE   Ketones, ur NEGATIVE NEGATIVE mg/dL   Protein, ur NEGATIVE NEGATIVE mg/dL   Nitrite NEGATIVE NEGATIVE   Leukocytes,Ua NEGATIVE NEGATIVE  Comprehensive metabolic panel  Result Value Ref Range   Sodium 138 135 - 145 mmol/L   Potassium 3.5 3.5 - 5.1 mmol/L   Chloride 108 98 - 111 mmol/L   CO2 24 22 - 32 mmol/L   Glucose, Bld 122 (H) 70 - 99 mg/dL   BUN 14 6 - 20 mg/dL   Creatinine, Ser 0.92 0.61 - 1.24 mg/dL   Calcium 9.4 8.9 - 10.3 mg/dL   Total Protein 7.8 6.5 - 8.1 g/dL   Albumin 4.0 3.5 - 5.0 g/dL   AST 26 15 - 41 U/L   ALT 32 0 - 44 U/L   Alkaline Phosphatase 75 38 - 126 U/L   Total Bilirubin 0.6 0.3 - 1.2 mg/dL   GFR, Estimated >60 >60 mL/min   Anion gap 6 5 - 15  Lipase, blood  Result Value Ref Range   Lipase 1,339 (H) 11 - 51 U/L   No results found.  EKG EKG Interpretation  Date/Time:  Monday November 14 2022 01:23:31 EST Ventricular Rate:  58 PR Interval:  151 QRS Duration: 96 QT Interval:  402 QTC Calculation: 395 R Axis:   74 Text Interpretation: Sinus rhythm Nonspecific repol abnormality, inferior leads Confirmed by Randal Buba, Lisset [employer] ([id number]) on 11/14/2022 1:26:38 AM  Radiology No results found.  Procedures Procedures    Medications Ordered in ED Medications  0.9 %  sodium chloride infusion ( Intravenous New Bag/Given 11/14/22 0140)  fentaNYL (SUBLIMAZE) injection 50 mcg (50 mcg Intravenous Given 11/14/22 0142)  ondansetron (ZOFRAN) injection 4 mg (4 mg Intravenous Given 11/14/22 0142)    ED Course/ Medical Decision Making/ A&P                           Medical Decision Making Patient with pancreatitis with recurrent abdominal pain   Amount and/or Complexity of Data Reviewed External Data Reviewed: labs, radiology and notes.    Details:  Previous notes and labs and CT reviewed  Labs: ordered.    Details: All labs reviewed:  normal sodium 138, normal potassium 3.5, normal creatinine .92 , normal LFTS.  Normal white count 5.7 normal hemoglobin 14.6, normal platelet count lipase markedly elevated 1339  Risk Prescription drug management. Decision regarding hospitalization.    Final Clinical Impression(s) / ED Diagnoses Final diagnoses:  Acute pancreatitis, unspecified complication status, unspecified pancreatitis type   The patient appears reasonably stabilized for admission considering the current resources, flow, and capabilities available in the ED at this time, and I doubt any other [facility] requiring further screening and/or treatment in the ED prior to admission.  Rx / DC Orders ED Discharge Orders     None         Karley Pho, MD 11/14/22 6378

## 2022-11-14 NOTE — ED Provider Notes (Signed)
Patient comes to the emergency with abdominal pain, nausea, vomiting.  Labs are overall reassuring besides elevated lipase of almost 1400.  Patient has history of pancreatitis.  Vital signs are stable and within normal limits.  No tachycardia, no profound dehydration.  Patient had requested pain medicine in the morning.  When I assessed him, he states that he feels better but continues to have some discomfort.  He is amenable to going home if his pain is better and if his vomiting is better.  Patient reassessed multiple times in the last 4 hours.  He is comfortable going home now.  He has received oral Percocet, oral Zofran and he has passed oral challenge.  Strict ER return precautions have been discussed, and patient is agreeing with the plan and is comfortable with the workup done and the recommendations from the ER.    Varney Biles, MD 11/14/22 1328

## 2022-11-14 NOTE — Progress Notes (Signed)
Hospitalist Transfer Note:  Transferring facility: [facility] Requesting provider: Dr. Randal Buba (EDP at [facility]) Reason for transfer: admission for further evaluation and management of acute pancreatitis.    51  y.o.  male who prior alcohol abuse hx, acute pancreatitis, who presented to [facility] ED complaining of 2 days of epigastric discomfort associated with nausea/vomiting, worse with any attempt to eat or drink anything.  He noted significant intensification of his abdominal discomfort when attempting to consume a bacon sandwich earlier today, ultimately prompting him to present to [place] emergency department.  Denies any associated fever.  While he acknowledges a former history of alcohol abuse, he denies any alcohol consumption since April 2023.  Afebrile at [facility].  EDP reports no acute peritoneal signs on exam.   Labs were notable for Lipase > 1300; no elevation of liver enzymes, including non-elevated total bili / alk phos.  No imaging performed at [facility].   Subsequently, I accepted this patient for transfer for inpatient admission to a med-surg bed at either [facility] or [facility] (first available) for further work-up and management of the above .        Check [email or web address] for on-call coverage.   Nursing staff, Please call [place] number on Amion as soon as patient's arrival, so appropriate admitting provider can evaluate the pt.     Babs Bertin, DO Hospitalist

## 2022-11-15 ENCOUNTER — Encounter (HOSPITAL_BASED_OUTPATIENT_CLINIC_OR_DEPARTMENT_OTHER): Payer: Self-pay

## 2022-11-15 ENCOUNTER — Other Ambulatory Visit: Payer: Self-pay

## 2022-11-15 ENCOUNTER — Encounter (HOSPITAL_COMMUNITY): Payer: Self-pay

## 2022-11-15 ENCOUNTER — Inpatient Hospital Stay (HOSPITAL_BASED_OUTPATIENT_CLINIC_OR_DEPARTMENT_OTHER)
Admission: EM | Admit: 2022-11-15 | Discharge: 2022-11-22 | DRG: 440 | Disposition: A | Discharge status: Home / Self Care | Payer: Medicaid Other | Attending: Internal Medicine | Admitting: Internal Medicine

## 2022-11-15 DIAGNOSIS — E876 Hypokalemia: Secondary | ICD-10-CM | POA: Diagnosis not present

## 2022-11-15 DIAGNOSIS — Z91038 Other insect allergy status: Secondary | ICD-10-CM | POA: Diagnosis not present

## 2022-11-15 DIAGNOSIS — K852 Alcohol induced acute pancreatitis without necrosis or infection: Secondary | ICD-10-CM | POA: Diagnosis not present

## 2022-11-15 DIAGNOSIS — Z87891 Personal history of nicotine dependence: Secondary | ICD-10-CM | POA: Diagnosis not present

## 2022-11-15 DIAGNOSIS — Z833 Family history of diabetes mellitus: Secondary | ICD-10-CM

## 2022-11-15 DIAGNOSIS — Z91030 Bee allergy status: Secondary | ICD-10-CM | POA: Diagnosis not present

## 2022-11-15 DIAGNOSIS — R1013 Epigastric pain: Secondary | ICD-10-CM | POA: Diagnosis present

## 2022-11-15 DIAGNOSIS — K861 Other chronic pancreatitis: Secondary | ICD-10-CM | POA: Diagnosis not present

## 2022-11-15 DIAGNOSIS — Z8249 Family history of ischemic heart disease and other diseases of the circulatory system: Secondary | ICD-10-CM

## 2022-11-15 DIAGNOSIS — K859 Acute pancreatitis without necrosis or infection, unspecified: Secondary | ICD-10-CM | POA: Diagnosis present

## 2022-11-15 DIAGNOSIS — Z79899 Other long term (current) drug therapy: Secondary | ICD-10-CM

## 2022-11-15 DIAGNOSIS — Z803 Family history of malignant neoplasm of breast: Secondary | ICD-10-CM | POA: Diagnosis not present

## 2022-11-15 DIAGNOSIS — I10 Essential (primary) hypertension: Secondary | ICD-10-CM | POA: Diagnosis not present

## 2022-11-15 LAB — CBC
HCT: 41.5 % (ref 39.0–52.0)
Hemoglobin: 14.1 g/dL (ref 13.0–17.0)
MCH: 29.2 pg (ref 26.0–34.0)
MCHC: 34 g/dL (ref 30.0–36.0)
MCV: 85.9 fL (ref 80.0–100.0)
Platelets: 196 10*3/uL (ref 150–400)
RBC: 4.83 MIL/uL (ref 4.22–5.81)
RDW: 13 % (ref 11.5–15.5)
WBC: 6.3 10*3/uL (ref 4.0–10.5)
nRBC: 0 % (ref 0.0–0.2)

## 2022-11-15 LAB — CBC WITH DIFFERENTIAL/PLATELET
Abs Immature Granulocytes: 0.01 10*3/uL (ref 0.00–0.07)
Basophils Absolute: 0 10*3/uL (ref 0.0–0.1)
Basophils Relative: 0 %
Eosinophils Absolute: 0 10*3/uL (ref 0.0–0.5)
Eosinophils Relative: 0 %
HCT: 41.1 % (ref 39.0–52.0)
Hemoglobin: 14.1 g/dL (ref 13.0–17.0)
Immature Granulocytes: 0 %
Lymphocytes Relative: 27 %
Lymphs Abs: 1.5 10*3/uL (ref 0.7–4.0)
MCH: 29.3 pg (ref 26.0–34.0)
MCHC: 34.3 g/dL (ref 30.0–36.0)
MCV: 85.4 fL (ref 80.0–100.0)
Monocytes Absolute: 0.4 10*3/uL (ref 0.1–1.0)
Monocytes Relative: 6 %
Neutro Abs: 3.7 10*3/uL (ref 1.7–7.7)
Neutrophils Relative %: 67 %
Platelets: 210 10*3/uL (ref 150–400)
RBC: 4.81 MIL/uL (ref 4.22–5.81)
RDW: 13.1 % (ref 11.5–15.5)
WBC: 5.6 10*3/uL (ref 4.0–10.5)
nRBC: 0 % (ref 0.0–0.2)

## 2022-11-15 LAB — CREATININE, SERUM
Creatinine, Ser: 0.92 mg/dL (ref 0.61–1.24)
GFR, Estimated: >60 mL/min (ref >60)

## 2022-11-15 LAB — COMPREHENSIVE METABOLIC PANEL
ALT: 25 U/L (ref 0–44)
AST: 20 U/L (ref 15–41)
Albumin: 3.9 g/dL (ref 3.5–5.0)
Alkaline Phosphatase: 72 U/L (ref 38–126)
Anion gap: 7 (ref 5–15)
BUN: 9 mg/dL (ref 6–20)
CO2: 26 mmol/L (ref 22–32)
Calcium: 9 mg/dL (ref 8.9–10.3)
Chloride: 104 mmol/L (ref 98–111)
Creatinine, Ser: 0.98 mg/dL (ref 0.61–1.24)
GFR, Estimated: >60 mL/min (ref >60)
Glucose, Bld: 105 mg/dL — ABNORMAL HIGH (ref 70–99)
Potassium: 3.8 mmol/L (ref 3.5–5.1)
Sodium: 137 mmol/L (ref 135–145)
Total Bilirubin: 0.7 mg/dL (ref 0.3–1.2)
Total Protein: 7.4 g/dL (ref 6.5–8.1)

## 2022-11-15 LAB — MAGNESIUM: Magnesium: 1.7 mg/dL (ref 1.7–2.4)

## 2022-11-15 LAB — LIPASE, BLOOD: Lipase: 414 U/L — ABNORMAL HIGH (ref 11–51)

## 2022-11-15 LAB — PHOSPHORUS: Phosphorus: 3 mg/dL (ref 2.5–4.6)

## 2022-11-15 MED ORDER — ACETAMINOPHEN 325 MG PO TABS
650.0000 mg | ORAL_TABLET | Freq: Four times a day (QID) | ORAL | Status: DC | PRN
  Administered 2022-11-16 – 2022-11-22 (×6): 650 mg via ORAL
  Filled 2022-11-15 (×7): qty 2

## 2022-11-15 MED ORDER — ENOXAPARIN SODIUM 40 MG/0.4ML IJ SOSY
40.0000 mg | PREFILLED_SYRINGE | INTRAMUSCULAR | Status: DC
  Administered 2022-11-15 – 2022-11-21 (×7): 40 mg via SUBCUTANEOUS
  Filled 2022-11-15 (×7): qty 0.4

## 2022-11-15 MED ORDER — MORPHINE SULFATE (PF) 4 MG/ML IV SOLN
4.0000 mg | Freq: Once | INTRAVENOUS | Status: AC
  Administered 2022-11-15: 4 mg via INTRAVENOUS
  Filled 2022-11-15: qty 1

## 2022-11-15 MED ORDER — SODIUM CHLORIDE 0.9 % IV BOLUS
1000.0000 mL | Freq: Once | INTRAVENOUS | Status: AC
  Administered 2022-11-15: 1000 mL via INTRAVENOUS

## 2022-11-15 MED ORDER — HYDRALAZINE HCL 20 MG/ML IJ SOLN
5.0000 mg | INTRAMUSCULAR | Status: DC | PRN
  Administered 2022-11-15 – 2022-11-17 (×6): 5 mg via INTRAVENOUS
  Filled 2022-11-15 (×6): qty 1

## 2022-11-15 MED ORDER — ACETAMINOPHEN 650 MG RE SUPP: 650.0000 mg | Freq: Four times a day (QID) | RECTAL | Status: DC | PRN

## 2022-11-15 MED ORDER — ONDANSETRON HCL 4 MG/2ML IJ SOLN
4.0000 mg | Freq: Four times a day (QID) | INTRAMUSCULAR | Status: DC | PRN
  Administered 2022-11-15 – 2022-11-18 (×4): 4 mg via INTRAVENOUS
  Filled 2022-11-15 (×4): qty 2

## 2022-11-15 MED ORDER — LACTATED RINGERS IV BOLUS
1000.0000 mL | Freq: Once | INTRAVENOUS | Status: AC
  Administered 2022-11-15: 1000 mL via INTRAVENOUS

## 2022-11-15 MED ORDER — HYDROMORPHONE HCL 1 MG/ML IJ SOLN
1.0000 mg | INTRAMUSCULAR | Status: DC | PRN
  Administered 2022-11-15 – 2022-11-19 (×18): 1 mg via INTRAVENOUS
  Filled 2022-11-15 (×18): qty 1

## 2022-11-15 MED ORDER — SODIUM CHLORIDE 0.9 % IV SOLN: Freq: Once | INTRAVENOUS | Status: AC

## 2022-11-15 MED ORDER — SODIUM CHLORIDE 0.9 % IV SOLN: INTRAVENOUS | Status: DC

## 2022-11-15 MED ORDER — ONDANSETRON HCL 4 MG/2ML IJ SOLN
4.0000 mg | Freq: Once | INTRAMUSCULAR | Status: AC
  Administered 2022-11-15: 4 mg via INTRAVENOUS
  Filled 2022-11-15: qty 2

## 2022-11-15 MED ORDER — HYDROCODONE-ACETAMINOPHEN 5-325 MG PO TABS
1.0000 | ORAL_TABLET | ORAL | Status: DC | PRN
  Administered 2022-11-16 (×2): 2 via ORAL
  Administered 2022-11-18: 1 via ORAL
  Administered 2022-11-18: 2 via ORAL
  Filled 2022-11-15: qty 1
  Filled 2022-11-15 (×3): qty 2

## 2022-11-15 NOTE — Plan of Care (Signed)

## 2022-11-15 NOTE — Progress Notes (Signed)
Plan of Care Note for accepted transfer   Patient: Max Ayers MRN: [id number]   [place]: 11/15/2022  Facility requesting transfer: [facility].  Requesting Provider: Unk Pinto, DO. Reason for transfer: Acute on chronic pancreatitis. Facility course:  Per Dr. Ronnald Nian:  " hief Complaint  Patient presents with   Abdominal Pain      Max Ayers is a 51 y.o. male.   Patient here with abdominal pain.  Diagnosed with pancreatitis yesterday.  Tried to go home prior to being admitted yesterday but has not been able to tolerate p.o.  Is been having nausea and vomiting.  Tried to have broth but not having success.  He does not think he has been able to progress at home.  History of alcoholic pancreatitis.  Has not drink alcohol in a long time.  But still gets flares.   The history is provided by the patient.".   Component Value Units  Lipase, blood [[id number]] (Abnormal)   Collected: 11/15/22 0833   Updated: 11/15/22 0957   Specimen Type: Blood   Specimen Source: Vein    Lipase 414 High  U/L  Comprehensive metabolic panel [[id number]] (Abnormal)   Collected: 11/15/22 0833   Updated: 11/15/22 0919   Specimen Type: Blood   Specimen Source: Vein    Sodium 137 mmol/L   Potassium 3.8 mmol/L   Chloride 104 mmol/L   CO2 26 mmol/L   Glucose, Bld 105 High  mg/dL   BUN 9 mg/dL   Creatinine, Ser 0.98 mg/dL   Calcium 9.0 mg/dL   Total Protein 7.4 g/dL   Albumin 3.9 g/dL   AST 20 U/L   ALT 25 U/L   Alkaline Phosphatase 72 U/L   Total Bilirubin 0.7 mg/dL   GFR, Estimated >60 mL/min   Anion gap 7  CBC with Differential [[id number]]   Collected: 11/15/22 0833   Updated: 11/15/22 0839   Specimen Type: Blood   Specimen Source: Vein    WBC 5.6 K/uL   RBC 4.81 MIL/uL   Hemoglobin 14.1 g/dL   HCT 41.1 %   MCV 85.4 fL   MCH 29.3 pg   MCHC 34.3 g/dL   RDW 13.1 %   Platelets 210 K/uL   nRBC 0.0 %   Neutrophils Relative % 67 %   Neutro Abs 3.7 K/uL   Lymphocytes Relative 27  %   Lymphs Abs 1.5 K/uL   Monocytes Relative 6 %   Monocytes Absolute 0.4 K/uL   Eosinophils Relative 0 %   Eosinophils Absolute 0.0 K/uL   Basophils Relative 0 %   Basophils Absolute 0.0 K/uL   Immature Granulocytes 0 %   Abs Immature Granulocytes 0.01 K/uL    Plan of care: The patient is accepted for admission to [place]  unit, at [facility].  Author: Reubin Milan, MD 11/15/2022  Check [email or web address] for on-call coverage.  Nursing staff, Please call [place] number on Amion as soon as patient's arrival, so appropriate admitting provider can evaluate the pt.

## 2022-11-15 NOTE — ED Triage Notes (Signed)
C/o abdominal pain since Friday. Seen here last night for same. States has been vomiting all night and pain has worsened. Dx with pancreatitis.

## 2022-11-15 NOTE — H&P (Signed)
History and Physical    Max Ayers DJT:[id number] DOB: 24-Jul-1971 DOA: 11/15/2022  PCP: Willeen Niece, PA  Patient coming from: Home  Chief Complaint: Abdominal pain   HPI: Max Ayers is a 51 y.o. male with medical history significant of alcoholic pancreatitis, HTN who presents with severe epigastric abdominal pain, nausea and vomiting.  He states that symptoms started several days ago, consistent with his previous pancreatitis flareup.  Pain is epigastric, radiates to the right upper quadrant.  Has been feeling hot, but no documented fevers.  Denies any shortness of breath or cough or respiratory symptoms.  No diarrhea.  He was initially seen in the ED on 12/4 and was diagnosed with pancreatitis. At that time, his lipase was 1339. He was sent home but failed outpatient pain regimen and returned to the hospital today. Repeat lipase 414.  He has also been having vomiting at home. Of note, this is his 8th ED/inpatient visit for acute pancreatitis in 2023.   Review of Systems: As per HPI. Otherwise, all other review of systems reviewed and are negative.   Past Medical History:  Diagnosis Date   Hypertension    Pancreatitis     Past Surgical History:  Procedure Laterality Date   NO PAST SURGERIES       reports that he has quit smoking. His smoking use included cigarettes and cigars. He smoked an average of 1 pack per day. He has never used smokeless tobacco. He reports that he does not currently use alcohol. He reports that he does not use drugs.  Allergies  Allergen Reactions   Yellow Jacket Venom [Bee Venom] Anaphylaxis    Family History  Problem Relation Age of Onset   Diabetes Mother    Hypertension Father    Breast cancer Maternal Grandmother    Colon cancer Neg Hx    Stomach cancer Neg Hx    Liver disease Neg Hx    Pancreatic cancer Neg Hx    Esophageal cancer Neg Hx    Rectal cancer Neg Hx     Prior to Admission medications   Medication Sig Start Date End Date  Taking? Authorizing Provider  amLODipine (NORVASC) 5 MG tablet Take 5 mg by mouth 2 (two) times a week. Wednesday and Friday    [provider]  ondansetron (ZOFRAN) 4 MG tablet Take 1 tablet (4 mg total) by mouth every 6 (six) hours as needed for nausea. 09/11/22   [place] Onis J, DO  ondansetron (ZOFRAN-ODT) 8 MG disintegrating tablet Take 1 tablet (8 mg total) by mouth every 8 (eight) hours as needed for nausea. 11/14/22   Varney Biles, MD  oxyCODONE-acetaminophen (PERCOCET/ROXICET) 5-325 MG tablet Take 1 tablet by mouth every 8 (eight) hours as needed for severe pain. 11/14/22   Varney Biles, MD  pantoprazole (PROTONIX) 40 MG tablet Take 1 tablet (40 mg total) by mouth at bedtime. 09/11/22 12/10/22  [place] Donnamarie Poag, DO  promethazine (PHENERGAN) 25 MG suppository Place 1 suppository (25 mg total) rectally every 6 (six) hours as needed for nausea or vomiting. 11/14/22   Varney Biles, MD    Physical Exam: Vitals:   11/15/22 1210 11/15/22 1409 11/15/22 1445 11/15/22 1546  BP:  (!) 178/94 (!) 163/78 (!) 188/88  Pulse:  71 (!) 57 62  Resp:  '18 18 15  '$ Temp: 98.2 F (36.8 C)  98.2 F (36.8 C) 98.3 F (36.8 C)  TempSrc: Oral   Oral  SpO2:  96% 93% 97%  Weight:  Height:        Constitutional: NAD, calm, comfortable Eyes: PERRL, lids and conjunctivae normal ENMT: Mucous membranes are moist. Normal dentition.  Respiratory: Clear to auscultation bilaterally, no wheezing, no crackles. Normal respiratory effort. No accessory muscle use. No conversational dyspnea  Cardiovascular: Regular rate and rhythm, no murmurs. No extremity edema.  Abdomen: Soft, nondistended, TTP epigastrium  Musculoskeletal: No joint deformity upper and lower extremities. No contractures. Normal muscle tone.  Skin: no rashes, lesions, ulcers on exposed skin  Neurologic: Alert and oriented, speech fluent, CN 2-12 grossly intact. No focal deficits.   Psychiatric: Normal judgment and insight. Normal mood  and affect   Labs on Admission: I have personally reviewed following labs and imaging studies  CBC: Recent Labs  Lab 11/14/22 0019 11/15/22 0833  WBC 5.7 5.6  NEUTROABS  --  3.7  HGB 14.6 14.1  HCT 43.2 41.1  MCV 85.7 85.4  PLT 238 176   Basic Metabolic Panel: Recent Labs  Lab 11/14/22 0105 11/15/22 0833  NA 138 137  K 3.5 3.8  CL 108 104  CO2 24 26  GLUCOSE 122* 105*  BUN 14 9  CREATININE 0.92 0.98  CALCIUM 9.4 9.0  MG  --  1.7  PHOS  --  3.0   GFR: Estimated Creatinine Clearance: 107.4 mL/min (by C-G formula based on SCr of 0.98 mg/dL). Liver Function Tests: Recent Labs  Lab 11/14/22 0105 11/15/22 0833  AST 26 20  ALT 32 25  ALKPHOS 75 72  BILITOT 0.6 0.7  PROT 7.8 7.4  ALBUMIN 4.0 3.9   Recent Labs  Lab 11/14/22 0105 11/15/22 0833  LIPASE 1,339* 414*   No results for input(s): "AMMONIA" in the last 168 hours. Coagulation Profile: No results for input(s): "INR", "PROTIME" in the last 168 hours. Cardiac Enzymes: No results for input(s): "CKTOTAL", "CKMB", "CKMBINDEX", "TROPONINI" in the last 168 hours. BNP (last 3 results) No results for input(s): "PROBNP" in the last 8760 hours. HbA1C: No results for input(s): "HGBA1C" in the last 72 hours. CBG: No results for input(s): "GLUCAP" in the last 168 hours. Lipid Profile: No results for input(s): "CHOL", "HDL", "LDLCALC", "TRIG", "CHOLHDL", "LDLDIRECT" in the last 72 hours. Thyroid Function Tests: No results for input(s): "TSH", "T4TOTAL", "FREET4", "T3FREE", "THYROIDAB" in the last 72 hours. Anemia Panel: No results for input(s): "VITAMINB12", "FOLATE", "FERRITIN", "TIBC", "IRON", "RETICCTPCT" in the last 72 hours. Urine analysis:    Component Value Date/Time   COLORURINE YELLOW 11/14/2022 0019   APPEARANCEUR CLEAR 11/14/2022 0019   LABSPEC >=1.030 11/14/2022 0019   PHURINE 5.5 11/14/2022 0019   GLUCOSEU NEGATIVE 11/14/2022 0019   HGBUR NEGATIVE 11/14/2022 0019   BILIRUBINUR NEGATIVE  11/14/2022 0019   KETONESUR NEGATIVE 11/14/2022 0019   PROTEINUR NEGATIVE 11/14/2022 0019   UROBILINOGEN 0.2 10/27/2011 0957   NITRITE NEGATIVE 11/14/2022 0019   LEUKOCYTESUR NEGATIVE 11/14/2022 0019   Sepsis Labs: !!!!!!!!!!!!!!!!!!!!!!!!!!!!!!!!!!!!!!!!!!!! '@LABRCNTIP'$ (procalcitonin:4,lacticidven:4) )No results found for this or any previous visit (from the past 240 hour(s)).   Radiological Exams on Admission: No results found.   Assessment/Plan Principal Problem:   Acute on chronic pancreatitis [facility]) Active Problems:   Benign essential HTN   Acute on chronic pancreatitis -Numerous hospital visits for same -Conservative management with IVF, bowel rest, pain control, antiemetics  HTN -Hydralazine PRN ordered    DVT prophylaxis: Lovenox  enoxaparin (LOVENOX) injection 40 mg Start: 11/15/22 1800  Code Status: Full  Family Communication: None at bedside Disposition Plan: home Consults called: none    Severity  of Illness: The appropriate patient status for this patient is OBSERVATION. Observation status is judged to be reasonable and necessary in order to provide the required intensity of service to ensure the patient's safety. The patient's presenting symptoms, physical exam findings, and initial radiographic and laboratory data in the context of their medical condition is felt to place them at decreased risk for further clinical deterioration. Furthermore, it is anticipated that the patient will be medically stable for discharge from the hospital within 2 midnights of admission.   Dessa Phi, DO [facility] 11/15/2022, 5:13 PM   Available via Epic secure chat 7am-7pm After these hours, please refer to coverage provider listed on [email or web address]

## 2022-11-15 NOTE — ED Provider Notes (Signed)
[facility] EMERGENCY DEPARTMENT Provider Note   CSN: [id number] Arrival date & time: 11/15/22  0741     History  Chief Complaint  Patient presents with   Abdominal Pain    Max Ayers is a 51 y.o. male.  Patient here with abdominal pain.  Diagnosed with pancreatitis yesterday.  Tried to go home prior to being admitted yesterday but has not been able to tolerate p.o.  Is been having nausea and vomiting.  Tried to have broth but not having success.  He does not think he has been able to progress at home.  History of alcoholic pancreatitis.  Has not drink alcohol in a long time.  But still gets flares.  The history is provided by the patient.       Home Medications Prior to Admission medications   Medication Sig Start Date End Date Taking? Authorizing Provider  amLODipine (NORVASC) 5 MG tablet Take 5 mg by mouth 2 (two) times a week. Wednesday and Friday    [provider]  ondansetron (ZOFRAN) 4 MG tablet Take 1 tablet (4 mg total) by mouth every 6 (six) hours as needed for nausea. 09/11/22   [place] Tae J, DO  ondansetron (ZOFRAN-ODT) 8 MG disintegrating tablet Take 1 tablet (8 mg total) by mouth every 8 (eight) hours as needed for nausea. 11/14/22   Varney Biles, MD  oxyCODONE-acetaminophen (PERCOCET/ROXICET) 5-325 MG tablet Take 1 tablet by mouth every 8 (eight) hours as needed for severe pain. 11/14/22   Varney Biles, MD  pantoprazole (PROTONIX) 40 MG tablet Take 1 tablet (40 mg total) by mouth at bedtime. 09/11/22 12/10/22  [place] Max Poag, DO  promethazine (PHENERGAN) 25 MG suppository Place 1 suppository (25 mg total) rectally every 6 (six) hours as needed for nausea or vomiting. 11/14/22   Varney Biles, MD      Allergies    Yellow jacket venom [bee venom]    Review of Systems   Review of Systems  Physical Exam Updated Vital Signs BP (!) 157/89 (BP Location: Right Arm)   Pulse 73   Temp 98.2 F (36.8 C) (Oral)   Resp 18   Ht '5\' 11"'$   (1.803 m)   Wt 95.3 kg   SpO2 98%   BMI 29.29 kg/m  Physical Exam Vitals and nursing note reviewed.  Constitutional:      General: He is not in acute distress.    Appearance: He is well-developed. He is ill-appearing.  HENT:     Head: Normocephalic and atraumatic.  Eyes:     Conjunctiva/sclera: Conjunctivae normal.  Cardiovascular:     Rate and Rhythm: Normal rate and regular rhythm.     Heart sounds: Normal heart sounds. No murmur heard. Pulmonary:     Effort: Pulmonary effort is normal. No respiratory distress.     Breath sounds: Normal breath sounds.  Abdominal:     Palpations: Abdomen is soft.     Tenderness: There is abdominal tenderness in the epigastric area.  Musculoskeletal:        General: No swelling.     Cervical back: Neck supple.  Skin:    General: Skin is warm and dry.     Capillary Refill: Capillary refill takes less than 2 seconds.  Neurological:     Mental Status: He is alert.  Psychiatric:        Mood and Affect: Mood normal.     ED Results / Procedures / Treatments   Labs (all labs ordered are listed, but only  abnormal results are displayed) Labs Reviewed  COMPREHENSIVE METABOLIC PANEL - Abnormal; Notable for the following components:      Result Value   Glucose, Bld 105 (*)    All other components within normal limits  LIPASE, BLOOD - Abnormal; Notable for the following components:   Lipase 414 (*)    All other components within normal limits  CBC WITH DIFFERENTIAL/PLATELET    EKG None  Radiology No results found.  Procedures Procedures    Medications Ordered in ED Medications  sodium chloride 0.9 % bolus 1,000 mL (1,000 mLs Intravenous New Bag/Given 11/15/22 0832)  morphine (PF) 4 MG/ML injection 4 mg (4 mg Intravenous Given 11/15/22 0832)  ondansetron (ZOFRAN) injection 4 mg (4 mg Intravenous Given 11/15/22 9371)    ED Course/ Medical Decision Making/ A&P                           Medical Decision Making Amount and/or  Complexity of Data Reviewed Labs: ordered.  Risk Prescription drug management. Decision regarding hospitalization.   [name] is here with abdominal pain.  Was seen here yesterday and discharged after being diagnosed with acute pancreatitis.  It seems that patient wanted to try to go home and deal with it with pain medication and nausea medicine but has not done well.  Anticipate discharge for further pancreatitis care.  Made NPO.  Will give Zofran, morphine, fluid bolus and recheck labs.  He does not have a fever.  Per my review yesterday had a lipase over thousand, CT scan with pancreatitis but no other concerning features.  On my review and interpretation of labs, lipase is improved in the 400 range but he still fairly symptomatic.  He is already failed outpatient pain regimen and nausea regimen.  Will admit for observation.  This chart was dictated using voice recognition software.  Despite best efforts to proofread,  errors can occur which can change the documentation meaning.         Final Clinical Impression(s) / ED Diagnoses Final diagnoses:  Acute pancreatitis, unspecified complication status, unspecified pancreatitis type    Rx / DC Orders ED Discharge Orders     None         Lennice Sites, DO 11/15/22 1010

## 2022-11-15 NOTE — ED Notes (Signed)
Report to RN at [facility].  [facility] en route.

## 2022-11-16 DIAGNOSIS — K859 Acute pancreatitis without necrosis or infection, unspecified: Secondary | ICD-10-CM | POA: Diagnosis not present

## 2022-11-16 DIAGNOSIS — R1013 Epigastric pain: Secondary | ICD-10-CM | POA: Diagnosis present

## 2022-11-16 DIAGNOSIS — Z91038 Other insect allergy status: Secondary | ICD-10-CM | POA: Diagnosis not present

## 2022-11-16 DIAGNOSIS — Z803 Family history of malignant neoplasm of breast: Secondary | ICD-10-CM | POA: Diagnosis not present

## 2022-11-16 DIAGNOSIS — E876 Hypokalemia: Secondary | ICD-10-CM | POA: Diagnosis not present

## 2022-11-16 DIAGNOSIS — Z8249 Family history of ischemic heart disease and other diseases of the circulatory system: Secondary | ICD-10-CM | POA: Diagnosis not present

## 2022-11-16 DIAGNOSIS — Z87891 Personal history of nicotine dependence: Secondary | ICD-10-CM | POA: Diagnosis not present

## 2022-11-16 DIAGNOSIS — Z79899 Other long term (current) drug therapy: Secondary | ICD-10-CM | POA: Diagnosis not present

## 2022-11-16 DIAGNOSIS — K861 Other chronic pancreatitis: Secondary | ICD-10-CM | POA: Diagnosis present

## 2022-11-16 DIAGNOSIS — Z91030 Bee allergy status: Secondary | ICD-10-CM | POA: Diagnosis not present

## 2022-11-16 DIAGNOSIS — K852 Alcohol induced acute pancreatitis without necrosis or infection: Secondary | ICD-10-CM | POA: Diagnosis present

## 2022-11-16 DIAGNOSIS — Z833 Family history of diabetes mellitus: Secondary | ICD-10-CM | POA: Diagnosis not present

## 2022-11-16 DIAGNOSIS — I10 Essential (primary) hypertension: Secondary | ICD-10-CM | POA: Diagnosis present

## 2022-11-16 LAB — COMPREHENSIVE METABOLIC PANEL
ALT: 24 U/L (ref 0–44)
AST: 19 U/L (ref 15–41)
Albumin: 3.9 g/dL (ref 3.5–5.0)
Alkaline Phosphatase: 70 U/L (ref 38–126)
Anion gap: 10 (ref 5–15)
BUN: 8 mg/dL (ref 6–20)
CO2: 21 mmol/L — ABNORMAL LOW (ref 22–32)
Calcium: 9.1 mg/dL (ref 8.9–10.3)
Chloride: 105 mmol/L (ref 98–111)
Creatinine, Ser: 0.73 mg/dL (ref 0.61–1.24)
GFR, Estimated: >60 mL/min (ref >60)
Glucose, Bld: 98 mg/dL (ref 70–99)
Potassium: 3.6 mmol/L (ref 3.5–5.1)
Sodium: 136 mmol/L (ref 135–145)
Total Bilirubin: 0.8 mg/dL (ref 0.3–1.2)
Total Protein: 7.7 g/dL (ref 6.5–8.1)

## 2022-11-16 LAB — CBC
HCT: 45 % (ref 39.0–52.0)
Hemoglobin: 15 g/dL (ref 13.0–17.0)
MCH: 29 pg (ref 26.0–34.0)
MCHC: 33.3 g/dL (ref 30.0–36.0)
MCV: 86.9 fL (ref 80.0–100.0)
Platelets: 223 10*3/uL (ref 150–400)
RBC: 5.18 MIL/uL (ref 4.22–5.81)
RDW: 13.1 % (ref 11.5–15.5)
WBC: 5.9 10*3/uL (ref 4.0–10.5)
nRBC: 0 % (ref 0.0–0.2)

## 2022-11-16 MED ORDER — HYDRALAZINE HCL 25 MG PO TABS
25.0000 mg | ORAL_TABLET | Freq: Three times a day (TID) | ORAL | Status: DC
  Administered 2022-11-16 – 2022-11-17 (×3): 25 mg via ORAL
  Filled 2022-11-16 (×3): qty 1

## 2022-11-16 MED ORDER — LABETALOL HCL 5 MG/ML IV SOLN
10.0000 mg | INTRAVENOUS | Status: DC | PRN
  Administered 2022-11-16 – 2022-11-17 (×2): 10 mg via INTRAVENOUS
  Filled 2022-11-16 (×2): qty 4

## 2022-11-16 NOTE — Progress Notes (Signed)
PROGRESS NOTE    Max Ayers  TGG:[id number] DOB: July 12, 1971 DOA: 11/15/2022 PCP: Willeen Niece, PA    Brief Narrative:  51 year old with history of alcohol pancreatitis, hypertension currently not taking any blood pressure medications presents with epigastric pain, nausea vomiting.  Initially seen in the emergency room and sent home with oral opiates but his pain did not relieve so came back to the emergency room.  Lipase was initially 1339-414.  Admitted for with acute pancreatitis.  As per reports, eighth ER visit in 2023.   Assessment & Plan:   Acute pancreatitis, likely alcoholic. Multiple recent scans without gallbladder stone.  Patient used to drink but now denies alcohol.  No drug use.  N.p.o., IV fluids, adequate IV and oral opiates for pain relief.  Repeat electrolytes, liver function test and lipase level tomorrow morning.  Essential hypertension: Not taking medicine at home.  Supposed to be on antihypertensives.  Did well on hydralazine IV.  Will start hydralazine by mouth with sips of water.     DVT prophylaxis: enoxaparin (LOVENOX) injection 40 mg Start: 11/15/22 1800   Code Status: Full code Family Communication: None at the bedside Disposition Plan: Status is: Observation The patient will require care spanning > 2 midnights and should be moved to inpatient because: Significant abdominal pain, unable to eat     Consultants:  None  Procedures:  None  Antimicrobials:  None   Subjective: Patient was seen and examined.  Complains of decreased flow of urine but urinating adequate.  Urine is clear.  Epigastric pain persist to the extent of continuing to use IV Dilaudid.  Denies any nausea but is not ready to eat.  Objective: Vitals:   11/16/22 0350 11/16/22 0740 11/16/22 0947 11/16/22 1353  BP: (!) 168/90 (!) 163/83 (!) 176/86 (!) 165/79  Pulse: 75 76 69 83  Resp: 17   17  Temp: 98.3 F (36.8 C) 98.9 F (37.2 C)  98.3 F (36.8 C)  TempSrc: Oral Oral   Oral  SpO2: 97% 98%  97%  Weight:      Height:        Intake/Output Summary (Last 24 hours) at 11/16/2022 1436 Last data filed at 11/15/2022 2115 Gross per 24 hour  Intake 1381.95 ml  Output --  Net 1381.95 ml   Filed Weights   11/15/22 0750  Weight: 95.3 kg    Examination:  General exam: Appears calm and comfortable at rest.  Mild distress while having pain. Respiratory system: Clear to auscultation. Respiratory effort normal.  No added Cardiovascular system: S1 & S2 heard, RRR.  Gastrointestinal system: Soft.  Mild epigastric tenderness on deep palpation and some voluntary guarding.  Bowel sound present. Central nervous system: Alert and oriented. No focal neurological deficits. Extremities: Symmetric 5 x 5 power. Skin: No rashes, lesions or ulcers Psychiatry: Judgement and insight appear normal. Mood & affect appropriate.     Data Reviewed: I have personally reviewed following labs and imaging studies  CBC: Recent Labs  Lab 11/14/22 0019 11/15/22 0833 11/15/22 1741 11/16/22 0547  WBC 5.7 5.6 6.3 5.9  NEUTROABS  --  3.7  --   --   HGB 14.6 14.1 14.1 15.0  HCT 43.2 41.1 41.5 45.0  MCV 85.7 85.4 85.9 86.9  PLT 238 210 196 703   Basic Metabolic Panel: Recent Labs  Lab 11/14/22 0105 11/15/22 0833 11/15/22 1741 11/16/22 0547  NA 138 137  --  136  K 3.5 3.8  --  3.6  CL 108 104  --  105  CO2 24 26  --  21*  GLUCOSE 122* 105*  --  98  BUN 14 9  --  8  CREATININE 0.92 0.98 0.92 0.73  CALCIUM 9.4 9.0  --  9.1  MG  --  1.7  --   --   PHOS  --  3.0  --   --    GFR: Estimated Creatinine Clearance: 131.6 mL/min (by C-G formula based on SCr of 0.73 mg/dL). Liver Function Tests: Recent Labs  Lab 11/14/22 0105 11/15/22 0833 11/16/22 0547  AST '26 20 19  '$ ALT 32 25 24  ALKPHOS 75 72 70  BILITOT 0.6 0.7 0.8  PROT 7.8 7.4 7.7  ALBUMIN 4.0 3.9 3.9   Recent Labs  Lab 11/14/22 0105 11/15/22 0833  LIPASE 1,339* 414*   No results for input(s): "AMMONIA" in  the last 168 hours. Coagulation Profile: No results for input(s): "INR", "PROTIME" in the last 168 hours. Cardiac Enzymes: No results for input(s): "CKTOTAL", "CKMB", "CKMBINDEX", "TROPONINI" in the last 168 hours. BNP (last 3 results) No results for input(s): "PROBNP" in the last 8760 hours. HbA1C: No results for input(s): "HGBA1C" in the last 72 hours. CBG: No results for input(s): "GLUCAP" in the last 168 hours. Lipid Profile: No results for input(s): "CHOL", "HDL", "LDLCALC", "TRIG", "CHOLHDL", "LDLDIRECT" in the last 72 hours. Thyroid Function Tests: No results for input(s): "TSH", "T4TOTAL", "FREET4", "T3FREE", "THYROIDAB" in the last 72 hours. Anemia Panel: No results for input(s): "VITAMINB12", "FOLATE", "FERRITIN", "TIBC", "IRON", "RETICCTPCT" in the last 72 hours. Sepsis Labs: No results for input(s): "PROCALCITON", "LATICACIDVEN" in the last 168 hours.  No results found for this or any previous visit (from the past 240 hour(s)).       Radiology Studies: No results found.      Scheduled Meds:  enoxaparin (LOVENOX) injection  40 mg Subcutaneous Q24H   hydrALAZINE  25 mg Oral Q8H   Continuous Infusions:  sodium chloride 150 mL/hr at 11/16/22 0946     LOS: 0 days    Time spent: 35 minutes    Barb Merino, MD [facility] Pager [phone]

## 2022-11-16 NOTE — Progress Notes (Signed)
  Transition of Care [facility]) Screening Note   Patient Details  Name: Kainoah Bartosiewicz Date of Birth: 1971-02-01   Transition of [facility]) CM/SW Contact:    Vassie Moselle, LCSW Phone Number: 11/16/2022, 3:03 PM    Transition of Care Department [facility]) has reviewed patient and no TOC needs have been identified at this time. We will continue to monitor patient advancement through interdisciplinary progression rounds. If new patient transition needs arise, please place a TOC consult.

## 2022-11-17 DIAGNOSIS — K859 Acute pancreatitis without necrosis or infection, unspecified: Secondary | ICD-10-CM | POA: Diagnosis not present

## 2022-11-17 DIAGNOSIS — K861 Other chronic pancreatitis: Secondary | ICD-10-CM | POA: Diagnosis not present

## 2022-11-17 LAB — LIPASE, BLOOD: Lipase: 225 U/L — ABNORMAL HIGH (ref 11–51)

## 2022-11-17 MED ORDER — PANTOPRAZOLE SODIUM 40 MG IV SOLR
40.0000 mg | Freq: Two times a day (BID) | INTRAVENOUS | Status: DC
  Administered 2022-11-17 – 2022-11-19 (×5): 40 mg via INTRAVENOUS
  Filled 2022-11-17 (×5): qty 10

## 2022-11-17 MED ORDER — HYDRALAZINE HCL 50 MG PO TABS
100.0000 mg | ORAL_TABLET | Freq: Three times a day (TID) | ORAL | Status: DC
  Administered 2022-11-17 – 2022-11-22 (×16): 100 mg via ORAL
  Filled 2022-11-17 (×16): qty 2

## 2022-11-17 MED ORDER — LABETALOL HCL 100 MG PO TABS
100.0000 mg | ORAL_TABLET | Freq: Two times a day (BID) | ORAL | Status: DC
  Administered 2022-11-17 – 2022-11-22 (×10): 100 mg via ORAL
  Filled 2022-11-17 (×10): qty 1

## 2022-11-17 MED ORDER — HYDRALAZINE HCL 50 MG PO TABS: 50.0000 mg | ORAL_TABLET | Freq: Three times a day (TID) | ORAL | Status: DC

## 2022-11-17 NOTE — Progress Notes (Signed)
Mobility Specialist - Progress Note   11/17/22 1306  Mobility  Activity Ambulated with assistance in hallway  Level of Assistance Modified independent, requires aide device or extra time  Assistive Device None  Distance Ambulated (ft) 500 ft  Activity Response Tolerated well  Mobility Referral Yes  $Mobility charge 1 Mobility   Pt received in bed and agreed to mobility with some pain and discomfort in abdomen area. Pt returned to bed with all needs met.   Roderick Pee Mobility Specialist

## 2022-11-17 NOTE — Progress Notes (Signed)
Patients BP high throughout afternoon.  1517 BP was 185/79 1532 IV hydralazine given 1546 BP was 189/85 1750 BP was 195/97 IV labetolol given at 1755 Recheck BP at 1829 was 187/91   Provider notified, see orders for new orders placed.   Dewayne Hatch, RN

## 2022-11-17 NOTE — Progress Notes (Signed)
PROGRESS NOTE    Max Ayers  [id number]:[id number] DOB: 10/28/1971 DOA: 11/15/2022 PCP: Willeen Niece, PA    Brief Narrative:  51 year old with history of alcoholic pancreatitis, hypertension currently not taking any blood pressure medications presents with epigastric pain, nausea vomiting.  Initially seen in the emergency room and sent home with oral opiates but his pain did not relieve so came back to the emergency room.  Lipase was initially 1339-414.  Admitted for with acute pancreatitis.  As per reports, eighth ER visit in 2023.   Assessment & Plan:   Acute pancreatitis, likely alcoholic. Multiple recent scans without gallbladder stone.  Patient used to drink but now denies alcohol.  No drug use.  Still with significant pain.  Continue n.p.o. with ice chips.  Adequate IV fluids.  Adequate IV and oral opiates for pain relief.  Lipase is trending down. Will start on clears if his pain is controlled with oral opiates. Will start on Protonix 40 mg IV twice daily to suppress acid and prevent reflux.  Essential hypertension: Blood pressures are uncontrolled.  Will keep patient on as needed IV and oral antihypertensives to control blood pressures to keep systolic blood pressure less than 180.   Not taking any medicine at home.  Hydralazine 25 mg 3 times daily, increase to 100 mg 3 times daily.        DVT prophylaxis: enoxaparin (LOVENOX) injection 40 mg Start: 11/15/22 1800   Code Status: Full code Family Communication: None at the bedside Disposition Plan: Status is: Inpatient.  Remains inpatient due to significant pain and unable to eat.  Consultants:  None  Procedures:  None  Antimicrobials:  None   Subjective:  Patient seen and examined.  Continues to have intermittent severe epigastric pain.  Denies any nausea or vomiting.  He did have chest burning sensation and retrosternal pain yesterday that is improved now.  EKG was normal.  Blood pressures remain  elevated.  Objective: Vitals:   11/16/22 2050 11/17/22 0249 11/17/22 0249 11/17/22 0600  BP: (!) 184/89 (!) 195/92  (!) 193/87  Pulse: 76 69  84  Resp: '20  20 18  '$ Temp: 98.7 F (37.1 C) 99.2 F (37.3 C)  98.5 F (36.9 C)  TempSrc: Oral Oral  Oral  SpO2: 98% 94%  99%  Weight:      Height:        Intake/Output Summary (Last 24 hours) at 11/17/2022 1129 Last data filed at 11/16/2022 1438 Gross per 24 hour  Intake 3174.34 ml  Output --  Net 3174.34 ml    Filed Weights   11/15/22 0750  Weight: 95.3 kg    Examination:  General exam: Appears fairly comfortable at rest. Respiratory system: Clear to auscultation. Respiratory effort normal.  No added Cardiovascular system: S1 & S2 heard, RRR.  Gastrointestinal system: Soft.  Mild epigastric tenderness on deep palpation and some voluntary guarding.  Bowel sounds present. Central nervous system: Alert and oriented. No focal neurological deficits. Extremities: Symmetric 5 x 5 power. Skin: No rashes, lesions or ulcers Psychiatry: Judgement and insight appear normal. Mood & affect appropriate.     Data Reviewed: I have personally reviewed following labs and imaging studies  CBC: Recent Labs  Lab 11/14/22 0019 11/15/22 0833 11/15/22 1741 11/16/22 0547  WBC 5.7 5.6 6.3 5.9  NEUTROABS  --  3.7  --   --   HGB 14.6 14.1 14.1 15.0  HCT 43.2 41.1 41.5 45.0  MCV 85.7 85.4 85.9 86.9  PLT 238 210 196  242    Basic Metabolic Panel: Recent Labs  Lab 11/14/22 0105 11/15/22 0833 11/15/22 1741 11/16/22 0547  NA 138 137  --  136  K 3.5 3.8  --  3.6  CL 108 104  --  105  CO2 24 26  --  21*  GLUCOSE 122* 105*  --  98  BUN 14 9  --  8  CREATININE 0.92 0.98 0.92 0.73  CALCIUM 9.4 9.0  --  9.1  MG  --  1.7  --   --   PHOS  --  3.0  --   --     GFR: Estimated Creatinine Clearance: 131.6 mL/min (by C-G formula based on SCr of 0.73 mg/dL). Liver Function Tests: Recent Labs  Lab 11/14/22 0105 11/15/22 0833 11/16/22 0547   AST '26 20 19  '$ ALT 32 25 24  ALKPHOS 75 72 70  BILITOT 0.6 0.7 0.8  PROT 7.8 7.4 7.7  ALBUMIN 4.0 3.9 3.9    Recent Labs  Lab 11/14/22 0105 11/15/22 0833 11/17/22 0557  LIPASE 1,339* 414* 225*    No results for input(s): "AMMONIA" in the last 168 hours. Coagulation Profile: No results for input(s): "INR", "PROTIME" in the last 168 hours. Cardiac Enzymes: No results for input(s): "CKTOTAL", "CKMB", "CKMBINDEX", "TROPONINI" in the last 168 hours. BNP (last 3 results) No results for input(s): "PROBNP" in the last 8760 hours. HbA1C: No results for input(s): "HGBA1C" in the last 72 hours. CBG: No results for input(s): "GLUCAP" in the last 168 hours. Lipid Profile: No results for input(s): "CHOL", "HDL", "LDLCALC", "TRIG", "CHOLHDL", "LDLDIRECT" in the last 72 hours. Thyroid Function Tests: No results for input(s): "TSH", "T4TOTAL", "FREET4", "T3FREE", "THYROIDAB" in the last 72 hours. Anemia Panel: No results for input(s): "VITAMINB12", "FOLATE", "FERRITIN", "TIBC", "IRON", "RETICCTPCT" in the last 72 hours. Sepsis Labs: No results for input(s): "PROCALCITON", "LATICACIDVEN" in the last 168 hours.  No results found for this or any previous visit (from the past 240 hour(s)).       Radiology Studies: No results found.      Scheduled Meds:  enoxaparin (LOVENOX) injection  40 mg Subcutaneous Q24H   hydrALAZINE  100 mg Oral Q8H   pantoprazole (PROTONIX) IV  40 mg Intravenous Q12H   Continuous Infusions:  sodium chloride 150 mL/hr at 11/16/22 1632     LOS: 1 day    Time spent: 35 minutes    Barb Merino, MD [facility] Pager [phone]

## 2022-11-18 DIAGNOSIS — K861 Other chronic pancreatitis: Secondary | ICD-10-CM | POA: Diagnosis not present

## 2022-11-18 DIAGNOSIS — K859 Acute pancreatitis without necrosis or infection, unspecified: Secondary | ICD-10-CM | POA: Diagnosis not present

## 2022-11-18 LAB — LIPASE, BLOOD: Lipase: 110 U/L — ABNORMAL HIGH (ref 11–51)

## 2022-11-18 MED ORDER — ORAL CARE MOUTH RINSE: 15.0000 mL | OROMUCOSAL | Status: DC | PRN

## 2022-11-18 NOTE — Plan of Care (Signed)

## 2022-11-18 NOTE — Progress Notes (Signed)
PROGRESS NOTE    Da Authement  ZOX:[id number] DOB: January 14, 1971 DOA: 11/15/2022 PCP: Willeen Niece, PA    Brief Narrative:  51 year old with history of alcoholic pancreatitis, hypertension currently not taking any blood pressure medications presents with epigastric pain, nausea vomiting.  Initially seen in the emergency room and sent home with oral opiates but his pain did not relieve so came back to the emergency room.  Lipase was initially [phone].  Admitted for with acute pancreatitis.  As per reports, eighth ER visit in 2023.   Assessment & Plan:   Acute pancreatitis, likely alcoholic.  Not currently using alcohol. Multiple recent scans without gallbladder stone.  Patient used to drink but now denies alcohol.  No drug use.  Some clinical improvement today.  Challenge with clears.  Adequate IV fluids.  Encourage oral opiates and discourage IV opiates. Will start on Protonix 40 mg IV twice daily to suppress acid and prevent reflux.  Will change to oral on discharge.  Essential hypertension: Blood pressures were uncontrolled.  Will keep patient on as needed IV and oral antihypertensives to control blood pressures to keep systolic blood pressure less than 180.  Started on hydralazine 100 mg 3 times daily, added labetalol 100 mg twice daily and blood pressures are better today. Will avoid overcorrection.  Will need prescription on discharge.     DVT prophylaxis: enoxaparin (LOVENOX) injection 40 mg Start: 11/15/22 1800   Code Status: Full code Family Communication: Wife on the phone. Disposition Plan: Status is: Inpatient.  Remains inpatient challenging oral intake.  Blood pressure controlled.  Consultants:  None  Procedures:  None  Antimicrobials:  None   Subjective:  Seen and examined.  Denies any nausea vomiting.  Still has some epigastric pain but somehow better than yesterday.  Blood pressures are better controlled.  Denies any headache.  No bowel movement yet.   Wants to try some clears.  Objective: Vitals:   11/17/22 1829 11/17/22 1934 11/17/22 2358 11/18/22 0449  BP: (!) 187/91 (!) 164/90 (!) 158/86 (!) 150/85  Pulse:  93 75 70  Resp:  17  18  Temp:  98.6 F (37 C)  97.9 F (36.6 C)  TempSrc:  Oral  Oral  SpO2:  94%  96%  Weight:      Height:        Intake/Output Summary (Last 24 hours) at 11/18/2022 1218 Last data filed at 11/18/2022 0600 Gross per 24 hour  Intake 1425.64 ml  Output --  Net 1425.64 ml    Filed Weights   11/15/22 0750  Weight: 95.3 kg    Examination:  General exam: Appears fairly comfortable at rest. Respiratory system: Clear to auscultation. Respiratory effort normal.  No added Cardiovascular system: S1 & S2 heard, RRR.  Gastrointestinal system: Soft.  Very mild  epigastric tenderness on deep palpation and some voluntary guarding.  Bowel sounds present. Central nervous system: Alert and oriented. No focal neurological deficits. Extremities: Symmetric 5 x 5 power. Skin: No rashes, lesions or ulcers Psychiatry: Judgement and insight appear normal. Mood & affect appropriate.     Data Reviewed: I have personally reviewed following labs and imaging studies  CBC: Recent Labs  Lab 11/14/22 0019 11/15/22 0833 11/15/22 1741 11/16/22 0547  WBC 5.7 5.6 6.3 5.9  NEUTROABS  --  3.7  --   --   HGB 14.6 14.1 14.1 15.0  HCT 43.2 41.1 41.5 45.0  MCV 85.7 85.4 85.9 86.9  PLT 238 210 196 811    Basic Metabolic  Panel: Recent Labs  Lab 11/14/22 0105 11/15/22 0833 11/15/22 1741 11/16/22 0547  NA 138 137  --  136  K 3.5 3.8  --  3.6  CL 108 104  --  105  CO2 24 26  --  21*  GLUCOSE 122* 105*  --  98  BUN 14 9  --  8  CREATININE 0.92 0.98 0.92 0.73  CALCIUM 9.4 9.0  --  9.1  MG  --  1.7  --   --   PHOS  --  3.0  --   --     GFR: Estimated Creatinine Clearance: 131.6 mL/min (by C-G formula based on SCr of 0.73 mg/dL). Liver Function Tests: Recent Labs  Lab 11/14/22 0105 11/15/22 0833  11/16/22 0547  AST '26 20 19  '$ ALT 32 25 24  ALKPHOS 75 72 70  BILITOT 0.6 0.7 0.8  PROT 7.8 7.4 7.7  ALBUMIN 4.0 3.9 3.9    Recent Labs  Lab 11/14/22 0105 11/15/22 0833 11/17/22 0557 11/18/22 0548  LIPASE 1,339* 414* 225* 110*    No results for input(s): "AMMONIA" in the last 168 hours. Coagulation Profile: No results for input(s): "INR", "PROTIME" in the last 168 hours. Cardiac Enzymes: No results for input(s): "CKTOTAL", "CKMB", "CKMBINDEX", "TROPONINI" in the last 168 hours. BNP (last 3 results) No results for input(s): "PROBNP" in the last 8760 hours. HbA1C: No results for input(s): "HGBA1C" in the last 72 hours. CBG: No results for input(s): "GLUCAP" in the last 168 hours. Lipid Profile: No results for input(s): "CHOL", "HDL", "LDLCALC", "TRIG", "CHOLHDL", "LDLDIRECT" in the last 72 hours. Thyroid Function Tests: No results for input(s): "TSH", "T4TOTAL", "FREET4", "T3FREE", "THYROIDAB" in the last 72 hours. Anemia Panel: No results for input(s): "VITAMINB12", "FOLATE", "FERRITIN", "TIBC", "IRON", "RETICCTPCT" in the last 72 hours. Sepsis Labs: No results for input(s): "PROCALCITON", "LATICACIDVEN" in the last 168 hours.  No results found for this or any previous visit (from the past 240 hour(s)).       Radiology Studies: No results found.      Scheduled Meds:  enoxaparin (LOVENOX) injection  40 mg Subcutaneous Q24H   hydrALAZINE  100 mg Oral Q8H   labetalol  100 mg Oral BID   pantoprazole (PROTONIX) IV  40 mg Intravenous Q12H   Continuous Infusions:  sodium chloride 75 mL/hr at 11/18/22 0137     LOS: 2 days    Time spent: 35 minutes    Barb Merino, MD [facility] Pager [phone]

## 2022-11-19 DIAGNOSIS — K861 Other chronic pancreatitis: Secondary | ICD-10-CM | POA: Diagnosis not present

## 2022-11-19 DIAGNOSIS — K859 Acute pancreatitis without necrosis or infection, unspecified: Secondary | ICD-10-CM | POA: Diagnosis not present

## 2022-11-19 LAB — LIPASE, BLOOD: Lipase: 83 U/L — ABNORMAL HIGH (ref 11–51)

## 2022-11-19 LAB — COMPREHENSIVE METABOLIC PANEL
ALT: 19 U/L (ref 0–44)
AST: 23 U/L (ref 15–41)
Albumin: 3.3 g/dL — ABNORMAL LOW (ref 3.5–5.0)
Alkaline Phosphatase: 58 U/L (ref 38–126)
Anion gap: 10 (ref 5–15)
BUN: 10 mg/dL (ref 6–20)
CO2: 22 mmol/L (ref 22–32)
Calcium: 9 mg/dL (ref 8.9–10.3)
Chloride: 107 mmol/L (ref 98–111)
Creatinine, Ser: 0.84 mg/dL (ref 0.61–1.24)
GFR, Estimated: >60 mL/min (ref >60)
Glucose, Bld: 90 mg/dL (ref 70–99)
Potassium: 3.3 mmol/L — ABNORMAL LOW (ref 3.5–5.1)
Sodium: 139 mmol/L (ref 135–145)
Total Bilirubin: 0.8 mg/dL (ref 0.3–1.2)
Total Protein: 6.8 g/dL (ref 6.5–8.1)

## 2022-11-19 LAB — CBC WITH DIFFERENTIAL/PLATELET
Abs Immature Granulocytes: 0.01 10*3/uL (ref 0.00–0.07)
Basophils Absolute: 0 10*3/uL (ref 0.0–0.1)
Basophils Relative: 0 %
Eosinophils Absolute: 0.1 10*3/uL (ref 0.0–0.5)
Eosinophils Relative: 3 %
HCT: 40.6 % (ref 39.0–52.0)
Hemoglobin: 13.4 g/dL (ref 13.0–17.0)
Immature Granulocytes: 0 %
Lymphocytes Relative: 37 %
Lymphs Abs: 1.8 10*3/uL (ref 0.7–4.0)
MCH: 28.6 pg (ref 26.0–34.0)
MCHC: 33 g/dL (ref 30.0–36.0)
MCV: 86.8 fL (ref 80.0–100.0)
Monocytes Absolute: 0.6 10*3/uL (ref 0.1–1.0)
Monocytes Relative: 13 %
Neutro Abs: 2.3 10*3/uL (ref 1.7–7.7)
Neutrophils Relative %: 47 %
Platelets: 217 10*3/uL (ref 150–400)
RBC: 4.68 MIL/uL (ref 4.22–5.81)
RDW: 13.4 % (ref 11.5–15.5)
WBC: 4.9 10*3/uL (ref 4.0–10.5)
nRBC: 0 % (ref 0.0–0.2)

## 2022-11-19 LAB — MAGNESIUM: Magnesium: 1.9 mg/dL (ref 1.7–2.4)

## 2022-11-19 MED ORDER — OXYCODONE HCL 5 MG PO TABS
5.0000 mg | ORAL_TABLET | ORAL | Status: DC | PRN
  Administered 2022-11-19 – 2022-11-21 (×8): 5 mg via ORAL
  Filled 2022-11-19 (×8): qty 1

## 2022-11-19 MED ORDER — POTASSIUM CHLORIDE CRYS ER 20 MEQ PO TBCR
20.0000 meq | EXTENDED_RELEASE_TABLET | Freq: Two times a day (BID) | ORAL | Status: AC
  Administered 2022-11-19 – 2022-11-20 (×4): 20 meq via ORAL
  Filled 2022-11-19 (×4): qty 1

## 2022-11-19 MED ORDER — PANTOPRAZOLE SODIUM 40 MG PO TBEC
40.0000 mg | DELAYED_RELEASE_TABLET | Freq: Two times a day (BID) | ORAL | Status: DC
  Administered 2022-11-19 – 2022-11-22 (×6): 40 mg via ORAL
  Filled 2022-11-19 (×6): qty 1

## 2022-11-19 NOTE — Progress Notes (Signed)
PROGRESS NOTE    Max Ayers  XHB:[id number] DOB: [birth year] DOA: 11/15/2022 PCP: Willeen Niece, PA    Brief Narrative:  51 year old with history of alcoholic pancreatitis, hypertension currently not taking any blood pressure medications presents with epigastric pain, nausea vomiting.  Initially seen in the emergency room and sent home with oral opiates but his pain did not relieve so came back to the emergency room.  Lipase was initially [phone].  Admitted for with acute pancreatitis.  As per reports, eighth ER visit in 2023.   Assessment & Plan:   Acute pancreatitis, likely alcoholic.  Not currently using alcohol. Multiple recent scans without gallbladder stone.  Patient used to drink but now denies alcohol.  No drug use.  Continues to have intermittent abdominal pain.  Clinically however improving.  Lipase normalizing. Will encourage full liquid diet today with oral pain medication support.  Continue IV fluids. Protonix for reflux symptoms.  Essential hypertension: Blood pressures were uncontrolled.  Will keep patient on as needed IV and oral antihypertensives to control blood pressures to keep systolic blood pressure less than 180.  Started on hydralazine 100 mg 3 times daily, added labetalol 100 mg twice daily and blood pressures are better today. Will avoid overcorrection.  Will need prescription on discharge.  Electrolytes: Hypokalemia.  Will replace.     DVT prophylaxis: enoxaparin (LOVENOX) injection 40 mg Start: 11/15/22 1800   Code Status: Full code Family Communication: None today. Disposition Plan: Status is: Inpatient.  Remains inpatient challenging oral intake.   Consultants:  None  Procedures:  None  Antimicrobials:  None   Subjective:  Seen and examined.  Tolerated clear liquid diet but still has intermittent abdominal pain.  He would like to try other pain medicine then Vicodin.  He would like to see if he can do full liquid diet today.  Blood  pressures are better today.  Not used injectable blood pressure medications since yesterday afternoon.  Objective: Vitals:   11/18/22 0449 11/18/22 1451 11/18/22 1952 11/19/22 0421  BP: (!) 150/85 (!) 151/84 (!) 184/97 (!) 169/86  Pulse: 70 77 89 89  Resp: '18  20 18  '$ Temp: 97.9 F (36.6 C) 97.8 F (36.6 C) 98.1 F (36.7 C) 97.9 F (36.6 C)  TempSrc: Oral Axillary Oral Oral  SpO2: 96% 97% 96% 97%  Weight:      Height:        Intake/Output Summary (Last 24 hours) at 11/19/2022 1321 Last data filed at 11/18/2022 1822 Gross per 24 hour  Intake 969 ml  Output --  Net 969 ml   Filed Weights   11/15/22 0750  Weight: 95.3 kg    Examination:  General exam: Appears anxious.  Mild distress with pain. Respiratory system: Clear to auscultation. Respiratory effort normal.  No added Cardiovascular system: S1 & S2 heard, RRR.  Gastrointestinal system: Soft.  mild  epigastric tenderness on deep palpation. Bowel sounds present. Central nervous system: Alert and oriented. No focal neurological deficits. Extremities: Symmetric 5 x 5 power. Skin: No rashes, lesions or ulcers Psychiatry: Judgement and insight appear normal. Mood & affect appropriate.     Data Reviewed: I have personally reviewed following labs and imaging studies  CBC: Recent Labs  Lab 11/14/22 0019 11/15/22 0833 11/15/22 1741 11/16/22 0547 11/19/22 0606  WBC 5.7 5.6 6.3 5.9 4.9  NEUTROABS  --  3.7  --   --  2.3  HGB 14.6 14.1 14.1 15.0 13.4  HCT 43.2 41.1 41.5 45.0 40.6  MCV 85.7 85.4  85.9 86.9 86.8  PLT 238 210 196 223 299   Basic Metabolic Panel: Recent Labs  Lab 11/14/22 0105 11/15/22 0833 11/15/22 1741 11/16/22 0547 11/19/22 0606  NA 138 137  --  136 139  K 3.5 3.8  --  3.6 3.3*  CL 108 104  --  105 107  CO2 24 26  --  21* 22  GLUCOSE 122* 105*  --  98 90  BUN 14 9  --  8 10  CREATININE 0.92 0.98 0.92 0.73 0.84  CALCIUM 9.4 9.0  --  9.1 9.0  MG  --  1.7  --   --  1.9  PHOS  --  3.0  --   --    --    GFR: Estimated Creatinine Clearance: 125.3 mL/min (by C-G formula based on SCr of 0.84 mg/dL). Liver Function Tests: Recent Labs  Lab 11/14/22 0105 11/15/22 0833 11/16/22 0547 11/19/22 0606  AST '26 20 19 23  '$ ALT 32 '25 24 19  '$ ALKPHOS 75 72 70 58  BILITOT 0.6 0.7 0.8 0.8  PROT 7.8 7.4 7.7 6.8  ALBUMIN 4.0 3.9 3.9 3.3*   Recent Labs  Lab 11/14/22 0105 11/15/22 0833 11/17/22 0557 11/18/22 0548 11/19/22 0606  LIPASE 1,339* 414* 225* 110* 83*   No results for input(s): "AMMONIA" in the last 168 hours. Coagulation Profile: No results for input(s): "INR", "PROTIME" in the last 168 hours. Cardiac Enzymes: No results for input(s): "CKTOTAL", "CKMB", "CKMBINDEX", "TROPONINI" in the last 168 hours. BNP (last 3 results) No results for input(s): "PROBNP" in the last 8760 hours. HbA1C: No results for input(s): "HGBA1C" in the last 72 hours. CBG: No results for input(s): "GLUCAP" in the last 168 hours. Lipid Profile: No results for input(s): "CHOL", "HDL", "LDLCALC", "TRIG", "CHOLHDL", "LDLDIRECT" in the last 72 hours. Thyroid Function Tests: No results for input(s): "TSH", "T4TOTAL", "FREET4", "T3FREE", "THYROIDAB" in the last 72 hours. Anemia Panel: No results for input(s): "VITAMINB12", "FOLATE", "FERRITIN", "TIBC", "IRON", "RETICCTPCT" in the last 72 hours. Sepsis Labs: No results for input(s): "PROCALCITON", "LATICACIDVEN" in the last 168 hours.  No results found for this or any previous visit (from the past 240 hour(s)).       Radiology Studies: No results found.      Scheduled Meds:  enoxaparin (LOVENOX) injection  40 mg Subcutaneous Q24H   hydrALAZINE  100 mg Oral Q8H   labetalol  100 mg Oral BID   pantoprazole  40 mg Oral BID   potassium chloride  20 mEq Oral BID   Continuous Infusions:  sodium chloride 75 mL/hr at 11/19/22 0418     LOS: 3 days    Time spent: 35 minutes    Barb Merino, MD [facility] Pager [phone]

## 2022-11-19 NOTE — Plan of Care (Signed)

## 2022-11-19 NOTE — Progress Notes (Signed)

## 2022-11-20 DIAGNOSIS — K861 Other chronic pancreatitis: Secondary | ICD-10-CM | POA: Diagnosis not present

## 2022-11-20 DIAGNOSIS — K859 Acute pancreatitis without necrosis or infection, unspecified: Secondary | ICD-10-CM | POA: Diagnosis not present

## 2022-11-20 LAB — LIPASE, BLOOD: Lipase: 106 U/L — ABNORMAL HIGH (ref 11–51)

## 2022-11-20 MED ORDER — DOCUSATE SODIUM 50 MG PO CAPS
50.0000 mg | ORAL_CAPSULE | Freq: Once | ORAL | Status: AC
  Administered 2022-11-20: 50 mg via ORAL
  Filled 2022-11-20: qty 1

## 2022-11-20 NOTE — Plan of Care (Signed)

## 2022-11-20 NOTE — Progress Notes (Signed)
PROGRESS NOTE    Max Ayers  IZT:[id number] DOB: Oct 18, 1971 DOA: 11/15/2022 PCP: Willeen Niece, PA    Brief Narrative:  51 year old with history of alcoholic pancreatitis, hypertension currently not taking any blood pressure medications presents with epigastric pain, nausea vomiting.  Initially seen in the emergency room and sent home with oral opiates but his pain did not relieve so came back to the emergency room.  Lipase was initially [phone].  Admitted for with acute pancreatitis.  As per reports, eighth ER visit in 2023.   Assessment & Plan:   Acute pancreatitis, likely alcoholic.  Not currently using alcohol. Multiple recent scans without gallbladder stone.  Patient used to drink but now denies alcohol.  No drug use.  Continues to have intermittent abdominal pain.  Clinically however improving.  Lipase normalizing. Will encourage full liquid diet with oral pain medication support.  Continue IV fluids. Protonix for reflux symptoms. His abdomen is soft and benign.  Recent multiple CT scans.  Currently no indication to repeat CT scans.  Essential hypertension: Blood pressures were uncontrolled. Started on hydralazine 100 mg 3 times daily, added labetalol 100 mg twice daily and blood pressures are better today. Will avoid overcorrection.  Will need prescription on discharge.  Electrolytes: Hypokalemia.  Replaced.  Adequate.     DVT prophylaxis: enoxaparin (LOVENOX) injection 40 mg Start: 11/15/22 1800   Code Status: Full code Family Communication: Wife on the phone. Disposition Plan: Status is: Inpatient.  Remains inpatient challenging oral intake.   Consultants:  None  Procedures:  None  Antimicrobials:  None   Subjective:  Patient seen and examined.  Continues to have intermittent pain but is trying to eat full liquid diet so that he can go home.  He has not used any injectable pain medications since last 24 hours and he is trying to manage that with oxycodone.   Has some headache.  Objective: Vitals:   11/19/22 1329 11/19/22 2019 11/19/22 2208 11/20/22 0401  BP: (!) 161/82 (!) 149/81 (!) 150/82 136/69  Pulse: 97 85 82 89  Resp: '20 17  17  '$ Temp: 97.8 F (36.6 C) 99 F (37.2 C)  99 F (37.2 C)  TempSrc: Oral Oral  Oral  SpO2: 97% 98%  94%  Weight:      Height:        Intake/Output Summary (Last 24 hours) at 11/20/2022 1221 Last data filed at 11/19/2022 2209 Gross per 24 hour  Intake 720 ml  Output --  Net 720 ml   Filed Weights   11/15/22 0750  Weight: 95.3 kg    Examination:  General exam: Appears anxious.  Not in any distress. Respiratory system: Clear to auscultation. Respiratory effort normal.  No added Cardiovascular system: S1 & S2 heard, RRR.  Gastrointestinal system: Soft.  mild  epigastric tenderness on deep palpation. Bowel sounds present. Central nervous system: Alert and oriented. No focal neurological deficits. Extremities: Symmetric 5 x 5 power. Skin: No rashes, lesions or ulcers Psychiatry: Judgement and insight appear normal. Mood & affect appropriate.     Data Reviewed: I have personally reviewed following labs and imaging studies  CBC: Recent Labs  Lab 11/14/22 0019 11/15/22 0833 11/15/22 1741 11/16/22 0547 11/19/22 0606  WBC 5.7 5.6 6.3 5.9 4.9  NEUTROABS  --  3.7  --   --  2.3  HGB 14.6 14.1 14.1 15.0 13.4  HCT 43.2 41.1 41.5 45.0 40.6  MCV 85.7 85.4 85.9 86.9 86.8  PLT 238 210 196 223 217  Basic Metabolic Panel: Recent Labs  Lab 11/14/22 0105 11/15/22 0833 11/15/22 1741 11/16/22 0547 11/19/22 0606  NA 138 137  --  136 139  K 3.5 3.8  --  3.6 3.3*  CL 108 104  --  105 107  CO2 24 26  --  21* 22  GLUCOSE 122* 105*  --  98 90  BUN 14 9  --  8 10  CREATININE 0.92 0.98 0.92 0.73 0.84  CALCIUM 9.4 9.0  --  9.1 9.0  MG  --  1.7  --   --  1.9  PHOS  --  3.0  --   --   --    GFR: Estimated Creatinine Clearance: 125.3 mL/min (by C-G formula based on SCr of 0.84 mg/dL). Liver Function  Tests: Recent Labs  Lab 11/14/22 0105 11/15/22 0833 11/16/22 0547 11/19/22 0606  AST '26 20 19 23  '$ ALT 32 '25 24 19  '$ ALKPHOS 75 72 70 58  BILITOT 0.6 0.7 0.8 0.8  PROT 7.8 7.4 7.7 6.8  ALBUMIN 4.0 3.9 3.9 3.3*   Recent Labs  Lab 11/15/22 0833 11/17/22 0557 11/18/22 0548 11/19/22 0606 11/20/22 0522  LIPASE 414* 225* 110* 83* 106*   No results for input(s): "AMMONIA" in the last 168 hours. Coagulation Profile: No results for input(s): "INR", "PROTIME" in the last 168 hours. Cardiac Enzymes: No results for input(s): "CKTOTAL", "CKMB", "CKMBINDEX", "TROPONINI" in the last 168 hours. BNP (last 3 results) No results for input(s): "PROBNP" in the last 8760 hours. HbA1C: No results for input(s): "HGBA1C" in the last 72 hours. CBG: No results for input(s): "GLUCAP" in the last 168 hours. Lipid Profile: No results for input(s): "CHOL", "HDL", "LDLCALC", "TRIG", "CHOLHDL", "LDLDIRECT" in the last 72 hours. Thyroid Function Tests: No results for input(s): "TSH", "T4TOTAL", "FREET4", "T3FREE", "THYROIDAB" in the last 72 hours. Anemia Panel: No results for input(s): "VITAMINB12", "FOLATE", "FERRITIN", "TIBC", "IRON", "RETICCTPCT" in the last 72 hours. Sepsis Labs: No results for input(s): "PROCALCITON", "LATICACIDVEN" in the last 168 hours.  No results found for this or any previous visit (from the past 240 hour(s)).       Radiology Studies: No results found.      Scheduled Meds:  enoxaparin (LOVENOX) injection  40 mg Subcutaneous Q24H   hydrALAZINE  100 mg Oral Q8H   labetalol  100 mg Oral BID   pantoprazole  40 mg Oral BID   potassium chloride  20 mEq Oral BID   Continuous Infusions:  sodium chloride 75 mL/hr at 11/20/22 0519     LOS: 4 days    Time spent: 35 minutes    Barb Merino, MD [facility] Pager [phone]

## 2022-11-21 DIAGNOSIS — K859 Acute pancreatitis without necrosis or infection, unspecified: Secondary | ICD-10-CM | POA: Diagnosis not present

## 2022-11-21 DIAGNOSIS — K861 Other chronic pancreatitis: Secondary | ICD-10-CM | POA: Diagnosis not present

## 2022-11-21 LAB — LIPASE, BLOOD: Lipase: 118 U/L — ABNORMAL HIGH (ref 11–51)

## 2022-11-21 MED ORDER — OXYCODONE HCL 5 MG PO TABS
5.0000 mg | ORAL_TABLET | Freq: Four times a day (QID) | ORAL | Status: DC | PRN
  Administered 2022-11-21 – 2022-11-22 (×4): 5 mg via ORAL
  Filled 2022-11-21 (×4): qty 1

## 2022-11-21 MED ORDER — HYDROMORPHONE HCL 1 MG/ML IJ SOLN: 1.0000 mg | INTRAMUSCULAR | Status: DC | PRN

## 2022-11-21 NOTE — Progress Notes (Signed)
PROGRESS NOTE    Max Ayers  UMP:[id number] DOB: [birth year] DOA: 11/15/2022 PCP: Willeen Niece, PA    Brief Narrative:  51 year old with history of alcoholic pancreatitis, hypertension currently not taking any blood pressure medications presents with epigastric pain, nausea vomiting.  Initially seen in the emergency room and sent home with oral opiates but his pain did not relieve so came back to the emergency room.  Lipase was initially 1339-414.  Admitted for with acute pancreatitis.  As per reports, eighth ER visit in 2023.  Assessment & Plan:   Acute recurrent pancreatitis, likely alcoholic.   Denies recent alcohol use, last drink April per report Abdominal imaging unremarkable for obstruction Continue to advance diet - currently tolerating full liquid - advance again today Decrease narcotic burden rapidly as he improves Protonix for reflux symptoms. His abdomen is soft and benign.  Recent multiple CT scans.  Currently no indication to repeat CT scans.  Essential hypertension:  Blood pressures were uncontrolled. Started on hydralazine 100 mg 3 times daily, added labetalol 100 mg twice daily and blood pressures are better today. Will avoid overcorrection.  Will need prescription on discharge.  Hypokalemia.  Likely due to poor PO intake/chronic alcohol use, repleted.  DVT prophylaxis: enoxaparin (LOVENOX) injection 40 mg Start: 11/15/22 1800   Code Status: Full code Family Communication: Wife on the phone. Disposition Plan: Status is: Inpatient.  Remains inpatient challenging oral intake.   Consultants:  None  Procedures:  None  Antimicrobials:  None   Subjective: No acute issues or events overnight denies nausea vomiting diarrhea constipation any fevers chills or chest pain  Objective: Vitals:   11/20/22 0401 11/20/22 1300 11/20/22 2035 11/21/22 0610  BP: 136/69 (!) 149/88 (!) 159/85 (!) 158/86  Pulse: 89 72 76 93  Resp: '17 18 16 18  '$ Temp: 99 F (37.2 C)  98.4 F (36.9 C) 98.1 F (36.7 C) 98.1 F (36.7 C)  TempSrc: Oral Oral Oral Oral  SpO2: 94% 94% 97% 95%  Weight:      Height:        Intake/Output Summary (Last 24 hours) at 11/21/2022 0819 Last data filed at 11/20/2022 2036 Gross per 24 hour  Intake 1190 ml  Output --  Net 1190 ml    Filed Weights   11/15/22 0750  Weight: 95.3 kg    Examination:  General exam: Appears anxious.  Not in any distress. Respiratory system: Clear to auscultation. Respiratory effort normal.  No added Cardiovascular system: S1 & S2 heard, RRR.  Gastrointestinal system: Soft.  mild  epigastric tenderness on deep palpation. Bowel sounds present. Central nervous system: Alert and oriented. No focal neurological deficits. Extremities: Symmetric 5 x 5 power. Skin: No rashes, lesions or ulcers Psychiatry: Judgement and insight appear normal. Mood & affect appropriate.   Data Reviewed: I have personally reviewed following labs and imaging studies  CBC: Recent Labs  Lab 11/15/22 0833 11/15/22 1741 11/16/22 0547 11/19/22 0606  WBC 5.6 6.3 5.9 4.9  NEUTROABS 3.7  --   --  2.3  HGB 14.1 14.1 15.0 13.4  HCT 41.1 41.5 45.0 40.6  MCV 85.4 85.9 86.9 86.8  PLT 210 196 223 400    Basic Metabolic Panel: Recent Labs  Lab 11/15/22 0833 11/15/22 1741 11/16/22 0547 11/19/22 0606  NA 137  --  136 139  K 3.8  --  3.6 3.3*  CL 104  --  105 107  CO2 26  --  21* 22  GLUCOSE 105*  --  98  90  BUN 9  --  8 10  CREATININE 0.98 0.92 0.73 0.84  CALCIUM 9.0  --  9.1 9.0  MG 1.7  --   --  1.9  PHOS 3.0  --   --   --     GFR: Estimated Creatinine Clearance: 125.3 mL/min (by C-G formula based on SCr of 0.84 mg/dL). Liver Function Tests: Recent Labs  Lab 11/15/22 0833 11/16/22 0547 11/19/22 0606  AST '20 19 23  '$ ALT '25 24 19  '$ ALKPHOS 72 70 58  BILITOT 0.7 0.8 0.8  PROT 7.4 7.7 6.8  ALBUMIN 3.9 3.9 3.3*    Recent Labs  Lab 11/17/22 0557 11/18/22 0548 11/19/22 0606 11/20/22 0522  11/21/22 0453  LIPASE 225* 110* 83* 106* 118*    No results for input(s): "AMMONIA" in the last 168 hours. Coagulation Profile: No results for input(s): "INR", "PROTIME" in the last 168 hours. Cardiac Enzymes: No results for input(s): "CKTOTAL", "CKMB", "CKMBINDEX", "TROPONINI" in the last 168 hours. BNP (last 3 results) No results for input(s): "PROBNP" in the last 8760 hours. HbA1C: No results for input(s): "HGBA1C" in the last 72 hours. CBG: No results for input(s): "GLUCAP" in the last 168 hours. Lipid Profile: No results for input(s): "CHOL", "HDL", "LDLCALC", "TRIG", "CHOLHDL", "LDLDIRECT" in the last 72 hours. Thyroid Function Tests: No results for input(s): "TSH", "T4TOTAL", "FREET4", "T3FREE", "THYROIDAB" in the last 72 hours. Anemia Panel: No results for input(s): "VITAMINB12", "FOLATE", "FERRITIN", "TIBC", "IRON", "RETICCTPCT" in the last 72 hours. Sepsis Labs: No results for input(s): "PROCALCITON", "LATICACIDVEN" in the last 168 hours.  No results found for this or any previous visit (from the past 240 hour(s)).       Radiology Studies: No results found.      Scheduled Meds:  enoxaparin (LOVENOX) injection  40 mg Subcutaneous Q24H   hydrALAZINE  100 mg Oral Q8H   labetalol  100 mg Oral BID   pantoprazole  40 mg Oral BID   Continuous Infusions:  sodium chloride 75 mL/hr at 11/21/22 0612     LOS: 5 days    Time spent: 35 minutes    Little Ishikawa, MD [facility] Pager [phone]

## 2022-11-22 DIAGNOSIS — K861 Other chronic pancreatitis: Secondary | ICD-10-CM | POA: Diagnosis not present

## 2022-11-22 DIAGNOSIS — K859 Acute pancreatitis without necrosis or infection, unspecified: Secondary | ICD-10-CM | POA: Diagnosis not present

## 2022-11-22 LAB — LIPASE, BLOOD: Lipase: 161 U/L — ABNORMAL HIGH (ref 11–51)

## 2022-11-22 LAB — CREATININE, SERUM
Creatinine, Ser: 0.95 mg/dL (ref 0.61–1.24)
GFR, Estimated: >60 mL/min (ref >60)

## 2022-11-22 MED ORDER — LABETALOL HCL 100 MG PO TABS: 100.0000 mg | ORAL_TABLET | Freq: Two times a day (BID) | ORAL | 0 refills | Status: DC

## 2022-11-22 MED ORDER — HYDRALAZINE HCL 100 MG PO TABS: 100.0000 mg | ORAL_TABLET | Freq: Three times a day (TID) | ORAL | 0 refills | Status: DC

## 2022-11-22 NOTE — Discharge Summary (Signed)
Physician Discharge Summary  Max Ayers HKV:[id number] DOB: February 07, 1971 DOA: 11/15/2022  PCP: Willeen Niece, PA  Admit date: 11/15/2022 Discharge date: 11/22/2022  Admitted From: Home Disposition: Home  Recommendations for Outpatient Follow-up:  Follow up with PCP in 1-2 weeks  Home Health: None Equipment/Devices: None  Discharge Condition: Stable CODE STATUS: Full Diet recommendation: Low-salt low-fat diet as discussed  Brief/Interim Summary: 51 year old with history of alcoholic pancreatitis, hypertension currently not taking any blood pressure medications presents with epigastric pain, nausea vomiting. Initially seen in the emergency room and sent home with oral opiates but his pain did not relieve so came back to the emergency room. Lipase was initially [phone]. Admitted for with acute pancreatitis. As per reports, eighth ER visit in 2023.   Patient admitted as above with acute on chronic recurrent pancreatitis likely in the setting of dietary and lifestyle noncompliance.  At this time patient's lipase is down trended quite drastically, tolerating p.o. quite well without any further symptoms otherwise stable and agreeable for discharge home.  Lengthy discussion with patient about need for medication compliance lifestyle compliance and discontinuation of any alcohol or possible over-the-counter medications that may be causing his chronic recurrent pancreatitis.  Follow-up with PCP/GI as scheduled.  Discharge Diagnoses:  Principal Problem:   Acute on chronic pancreatitis [facility]) Active Problems:   Benign essential HTN   Pancreatitis    Discharge Instructions  Discharge Instructions     Discharge patient   Complete by: As directed    Discharge disposition: 01-Home or Self Care   Discharge patient date: 11/22/2022      Allergies as of 11/22/2022       Reactions   Yellow Jacket Venom [bee Venom] Anaphylaxis        Medication List     STOP taking these  medications    ondansetron 4 MG tablet Commonly known as: ZOFRAN   ondansetron 8 MG disintegrating tablet Commonly known as: ZOFRAN-ODT   promethazine 25 MG suppository Commonly known as: PHENERGAN       TAKE these medications    Clear Eyes Complete Soln Place 1 drop into both eyes 3 (three) times daily as needed (for irritation).   hydrALAZINE 100 MG tablet Commonly known as: APRESOLINE Take 1 tablet (100 mg total) by mouth every 8 (eight) hours.   labetalol 100 MG tablet Commonly known as: NORMODYNE Take 1 tablet (100 mg total) by mouth 2 (two) times daily.   oxyCODONE-acetaminophen 5-325 MG tablet Commonly known as: PERCOCET/ROXICET Take by mouth every 8 (eight) hours as needed for severe pain.   pantoprazole 40 MG tablet Commonly known as: PROTONIX Take 1 tablet (40 mg total) by mouth at bedtime. What changed:  when to take this reasons to take this   TYLENOL 500 MG tablet Generic drug: acetaminophen Take 500-1,000 mg by mouth every 8 (eight) hours as needed for headache or mild pain.        Allergies  Allergen Reactions   Yellow Jacket Venom [Bee Venom] Anaphylaxis    Consultations: None   Procedures/Studies: No results found.   Subjective: No acute issues or events overnight tolerating p.o. quite well denies nausea vomiting diarrhea constipation headache fevers chills or chest pain   Discharge Exam: Vitals:   11/22/22 0816 11/22/22 1243  BP: (!) 157/89 (!) 145/87  Pulse: 90 97  Resp:  18  Temp:  100 F (37.8 C)  SpO2:  98%   Vitals:   11/21/22 2101 11/22/22 0642 11/22/22 0816 11/22/22 1243  BP: (!) 167/89 Marland Kitchen)  170/81 (!) 157/89 (!) 145/87  Pulse: 84 89 90 97  Resp: '20 20  18  '$ Temp: 98.6 F (37 C) 99.4 F (37.4 C)  100 F (37.8 C)  TempSrc: Oral Oral  Oral  SpO2: 98% 94%  98%  Weight:      Height:        General: Pt is alert, awake, not in acute distress Cardiovascular: RRR, S1/S2 +, no rubs, no gallops Respiratory: CTA  bilaterally, no wheezing, no rhonchi Abdominal: Soft, NT, ND, bowel sounds + Extremities: no edema, no cyanosis    The results of significant diagnostics from this hospitalization (including imaging, microbiology, ancillary and laboratory) are listed below for reference.     Microbiology: No results found for this or any previous visit (from the past 240 hour(s)).   Labs: BNP (last 3 results) No results for input(s): "BNP" in the last 8760 hours. Basic Metabolic Panel: Recent Labs  Lab 11/15/22 1741 11/16/22 0547 11/19/22 0606 11/22/22 0501  NA  --  136 139  --   K  --  3.6 3.3*  --   CL  --  105 107  --   CO2  --  21* 22  --   GLUCOSE  --  98 90  --   BUN  --  8 10  --   CREATININE 0.92 0.73 0.84 0.95  CALCIUM  --  9.1 9.0  --   MG  --   --  1.9  --    Liver Function Tests: Recent Labs  Lab 11/16/22 0547 11/19/22 0606  AST 19 23  ALT 24 19  ALKPHOS 70 58  BILITOT 0.8 0.8  PROT 7.7 6.8  ALBUMIN 3.9 3.3*   Recent Labs  Lab 11/18/22 0548 11/19/22 0606 11/20/22 0522 11/21/22 0453 11/22/22 0501  LIPASE 110* 83* 106* 118* 161*   No results for input(s): "AMMONIA" in the last 168 hours. CBC: Recent Labs  Lab 11/15/22 1741 11/16/22 0547 11/19/22 0606  WBC 6.3 5.9 4.9  NEUTROABS  --   --  2.3  HGB 14.1 15.0 13.4  HCT 41.5 45.0 40.6  MCV 85.9 86.9 86.8  PLT 196 223 217   Cardiac Enzymes: No results for input(s): "CKTOTAL", "CKMB", "CKMBINDEX", "TROPONINI" in the last 168 hours. BNP: Invalid input(s): "POCBNP" CBG: No results for input(s): "GLUCAP" in the last 168 hours. D-Dimer No results for input(s): "DDIMER" in the last 72 hours. Hgb A1c No results for input(s): "HGBA1C" in the last 72 hours. Lipid Profile No results for input(s): "CHOL", "HDL", "LDLCALC", "TRIG", "CHOLHDL", "LDLDIRECT" in the last 72 hours. Thyroid function studies No results for input(s): "TSH", "T4TOTAL", "T3FREE", "THYROIDAB" in the last 72 hours.  Invalid input(s):  "FREET3" Anemia work up No results for input(s): "VITAMINB12", "FOLATE", "FERRITIN", "TIBC", "IRON", "RETICCTPCT" in the last 72 hours. Urinalysis    Component Value Date/Time   COLORURINE YELLOW 11/14/2022 0019   APPEARANCEUR CLEAR 11/14/2022 0019   LABSPEC >=1.030 11/14/2022 0019   PHURINE 5.5 11/14/2022 0019   GLUCOSEU NEGATIVE 11/14/2022 0019   HGBUR NEGATIVE 11/14/2022 0019   BILIRUBINUR NEGATIVE 11/14/2022 0019   KETONESUR NEGATIVE 11/14/2022 0019   PROTEINUR NEGATIVE 11/14/2022 0019   UROBILINOGEN 0.2 10/27/2011 0957   NITRITE NEGATIVE 11/14/2022 0019   LEUKOCYTESUR NEGATIVE 11/14/2022 0019   Sepsis Labs Recent Labs  Lab 11/15/22 1741 11/16/22 0547 11/19/22 0606  WBC 6.3 5.9 4.9   Microbiology No results found for this or any previous visit (from the past 240 hour(s)).  Time coordinating discharge: Over 30 minutes  SIGNED:   Little Ishikawa, DO [facility] 11/22/2022, 2:41 PM Pager   If 7PM-7AM, please contact night-coverage [email or web address]

## 2022-11-22 NOTE — Progress Notes (Signed)
Patient discharged via wheelchair accompanied by wife. Pt. Is alert and oriented, no distress. Discharge instruction and education discussed with patient, all personal belongings are with the family.

## 2022-11-22 NOTE — Plan of Care (Signed)

## 2022-11-25 ENCOUNTER — Other Ambulatory Visit: Payer: Self-pay

## 2022-11-25 ENCOUNTER — Encounter (HOSPITAL_BASED_OUTPATIENT_CLINIC_OR_DEPARTMENT_OTHER): Payer: Self-pay

## 2022-11-25 DIAGNOSIS — F17210 Nicotine dependence, cigarettes, uncomplicated: Secondary | ICD-10-CM | POA: Diagnosis not present

## 2022-11-25 DIAGNOSIS — R1011 Right upper quadrant pain: Secondary | ICD-10-CM | POA: Diagnosis present

## 2022-11-25 DIAGNOSIS — I10 Essential (primary) hypertension: Secondary | ICD-10-CM | POA: Diagnosis not present

## 2022-11-25 DIAGNOSIS — K2980 Duodenitis without bleeding: Secondary | ICD-10-CM | POA: Insufficient documentation

## 2022-11-25 LAB — URINALYSIS, ROUTINE W REFLEX MICROSCOPIC
Bilirubin Urine: NEGATIVE
Glucose, UA: NEGATIVE mg/dL
Hgb urine dipstick: NEGATIVE
Ketones, ur: NEGATIVE mg/dL
Leukocytes,Ua: NEGATIVE
Nitrite: NEGATIVE
Protein, ur: 30 mg/dL — AB
Specific Gravity, Urine: >=1.03 (ref 1.005–1.030)
pH: 5.5 (ref 5.0–8.0)

## 2022-11-25 LAB — URINALYSIS, MICROSCOPIC (REFLEX)

## 2022-11-25 LAB — CBC
HCT: 43.9 % (ref 39.0–52.0)
Hemoglobin: 14.9 g/dL (ref 13.0–17.0)
MCH: 29.1 pg (ref 26.0–34.0)
MCHC: 33.9 g/dL (ref 30.0–36.0)
MCV: 85.7 fL (ref 80.0–100.0)
Platelets: 326 10*3/uL (ref 150–400)
RBC: 5.12 MIL/uL (ref 4.22–5.81)
RDW: 12.9 % (ref 11.5–15.5)
WBC: 5.2 10*3/uL (ref 4.0–10.5)
nRBC: 0 % (ref 0.0–0.2)

## 2022-11-25 NOTE — ED Triage Notes (Signed)
Complaining of pain in the rt upper quadrant of abdomen. Started today.

## 2022-11-26 ENCOUNTER — Emergency Department (HOSPITAL_BASED_OUTPATIENT_CLINIC_OR_DEPARTMENT_OTHER): Payer: Medicaid Other

## 2022-11-26 ENCOUNTER — Emergency Department (HOSPITAL_BASED_OUTPATIENT_CLINIC_OR_DEPARTMENT_OTHER)
Admission: EM | Admit: 2022-11-26 | Discharge: 2022-11-26 | Disposition: A | Discharge status: Home / Self Care | Payer: Medicaid Other | Attending: Emergency Medicine | Admitting: Emergency Medicine

## 2022-11-26 ENCOUNTER — Encounter (HOSPITAL_BASED_OUTPATIENT_CLINIC_OR_DEPARTMENT_OTHER): Payer: Self-pay

## 2022-11-26 DIAGNOSIS — R1011 Right upper quadrant pain: Secondary | ICD-10-CM

## 2022-11-26 DIAGNOSIS — K2980 Duodenitis without bleeding: Secondary | ICD-10-CM

## 2022-11-26 LAB — COMPREHENSIVE METABOLIC PANEL
ALT: 22 U/L (ref 0–44)
AST: 21 U/L (ref 15–41)
Albumin: 4.1 g/dL (ref 3.5–5.0)
Alkaline Phosphatase: 69 U/L (ref 38–126)
Anion gap: 9 (ref 5–15)
BUN: 26 mg/dL — ABNORMAL HIGH (ref 6–20)
CO2: 20 mmol/L — ABNORMAL LOW (ref 22–32)
Calcium: 9.1 mg/dL (ref 8.9–10.3)
Chloride: 107 mmol/L (ref 98–111)
Creatinine, Ser: 1.09 mg/dL (ref 0.61–1.24)
GFR, Estimated: >60 mL/min (ref >60)
Glucose, Bld: 128 mg/dL — ABNORMAL HIGH (ref 70–99)
Potassium: 3.6 mmol/L (ref 3.5–5.1)
Sodium: 136 mmol/L (ref 135–145)
Total Bilirubin: 0.3 mg/dL (ref 0.3–1.2)
Total Protein: 8.2 g/dL — ABNORMAL HIGH (ref 6.5–8.1)

## 2022-11-26 LAB — LIPASE, BLOOD: Lipase: 458 U/L — ABNORMAL HIGH (ref 11–51)

## 2022-11-26 MED ORDER — ONDANSETRON HCL 4 MG/2ML IJ SOLN
4.0000 mg | Freq: Once | INTRAMUSCULAR | Status: AC
  Administered 2022-11-26: 4 mg via INTRAVENOUS
  Filled 2022-11-26: qty 2

## 2022-11-26 MED ORDER — HYDROMORPHONE HCL 1 MG/ML IJ SOLN
1.0000 mg | Freq: Once | INTRAMUSCULAR | Status: AC
  Administered 2022-11-26: 1 mg via INTRAVENOUS
  Filled 2022-11-26: qty 1

## 2022-11-26 MED ORDER — PANTOPRAZOLE SODIUM 40 MG IV SOLR
40.0000 mg | Freq: Once | INTRAVENOUS | Status: AC
  Administered 2022-11-26: 40 mg via INTRAVENOUS
  Filled 2022-11-26: qty 10

## 2022-11-26 MED ORDER — LACTATED RINGERS IV BOLUS
1000.0000 mL | Freq: Once | INTRAVENOUS | Status: AC
  Administered 2022-11-26: 1000 mL via INTRAVENOUS

## 2022-11-26 MED ORDER — IOHEXOL 300 MG/ML  SOLN
100.0000 mL | Freq: Once | INTRAMUSCULAR | Status: AC | PRN
  Administered 2022-11-26: 100 mL via INTRAVENOUS

## 2022-11-26 MED ORDER — OXYCODONE HCL 5 MG PO TABS: 5.0000 mg | ORAL_TABLET | ORAL | 0 refills | Status: DC | PRN

## 2022-11-26 MED ORDER — HYDROMORPHONE HCL 1 MG/ML IJ SOLN
0.5000 mg | Freq: Once | INTRAMUSCULAR | Status: AC
  Administered 2022-11-26: 0.5 mg via INTRAVENOUS
  Filled 2022-11-26: qty 1

## 2022-11-26 NOTE — ED Provider Notes (Signed)
[facility] Emergency Department Provider Note MRN:  [id number]  Arrival date & time: 11/26/22     Chief Complaint   Abdominal Pain   History of Present Illness   Max Ayers is a 51 y.o. year-old male with a history of hypertension, pancreatitis presenting to the ED with chief complaint of abdominal pain.  Right upper quadrant abdominal pain all day today associated with nausea, vomiting.  Ate a lot of grapes last night and had some diarrhea this morning.  No fever.  Was in the hospital for pancreatitis a few weeks ago.  He has stopped drinking.  Review of Systems  A thorough review of systems was obtained and all systems are negative except as noted in the HPI and PMH.   Patient's Health History    Past Medical History:  Diagnosis Date   Hypertension    Pancreatitis     Past Surgical History:  Procedure Laterality Date   NO PAST SURGERIES      Family History  Problem Relation Age of Onset   Diabetes Mother    Hypertension Father    Breast cancer Maternal Grandmother    Colon cancer Neg Hx    Stomach cancer Neg Hx    Liver disease Neg Hx    Pancreatic cancer Neg Hx    Esophageal cancer Neg Hx    Rectal cancer Neg Hx     Social History   Socioeconomic History   Marital status: Married    Spouse name: Not on file   Number of children: Not on file   Years of education: Not on file   Highest education level: Not on file  Occupational History   Not on file  Tobacco Use   Smoking status: Former    Packs/day: 1.00    Types: Cigarettes, Cigars   Smokeless tobacco: Never  Vaping Use   Vaping Use: Every day   Substances: Flavoring  Substance and Sexual Activity   Alcohol use: Not Currently    Comment: occ   Drug use: No   Sexual activity: Yes    Birth control/protection: Condom  Other Topics Concern   Not on file  Social History Narrative   Not on file   Social Determinants of Health   Financial Resource Strain: Not on file   Food Insecurity: No Food Insecurity (11/15/2022)   Hunger Vital Sign    Worried About Running Out of Food in the Last Year: Never true    Ran Out of Food in the Last Year: Never true  Transportation Needs: No Transportation Needs (11/15/2022)   PRAPARE - Hydrologist (Medical): No    Lack of Transportation (Non-Medical): No  Physical Activity: Not on file  Stress: Not on file  Social Connections: Not on file  Intimate Partner Violence: Not At Risk (11/15/2022)   Humiliation, Afraid, Rape, and Kick questionnaire    Fear of Current or Ex-Partner: No    Emotionally Abused: No    Physically Abused: No    Sexually Abused: No     Physical Exam   Vitals:   11/25/22 2316 11/26/22 0216  BP: (!) 147/90 135/73  Pulse: 96 72  Resp: 18 18  Temp: 98.2 F (36.8 C) 97.7 F (36.5 C)  SpO2: 99% 99%    CONSTITUTIONAL: Well-appearing, tearful, moderate distress due to pain NEURO/PSYCH:  Alert and oriented x 3, no focal deficits EYES:  eyes equal and reactive ENT/NECK:  no LAD, no JVD CARDIO:  Regular rate, well-perfused, normal S1 and S2 PULM:  CTAB no wheezing or rhonchi GI/GU:  non-distended, moderate right upper quadrant tenderness to palpation MSK/SPINE:  No gross deformities, no edema SKIN:  no rash, atraumatic   *Additional and/or pertinent findings included in MDM below  Diagnostic and Interventional Summary    EKG Interpretation  Date/Time:    Ventricular Rate:    PR Interval:    QRS Duration:   QT Interval:    QTC Calculation:   R Axis:     Text Interpretation:         Labs Reviewed  LIPASE, BLOOD - Abnormal; Notable for the following components:      Result Value   Lipase 458 (*)    All other components within normal limits  COMPREHENSIVE METABOLIC PANEL - Abnormal; Notable for the following components:   CO2 20 (*)    Glucose, Bld 128 (*)    BUN 26 (*)    Total Protein 8.2 (*)    All other components within normal limits   URINALYSIS, ROUTINE W REFLEX MICROSCOPIC - Abnormal; Notable for the following components:   APPearance HAZY (*)    Protein, ur 30 (*)    All other components within normal limits  URINALYSIS, MICROSCOPIC (REFLEX) - Abnormal; Notable for the following components:   Bacteria, UA RARE (*)    All other components within normal limits  CBC    CT ABDOMEN PELVIS W CONTRAST  Final Result      Medications  lactated ringers bolus 1,000 mL (0 mLs Intravenous Stopped 11/26/22 0230)  HYDROmorphone (DILAUDID) injection 1 mg (1 mg Intravenous Given 11/26/22 0111)  ondansetron (ZOFRAN) injection 4 mg (4 mg Intravenous Given 11/26/22 0111)  iohexol (OMNIPAQUE) 300 MG/ML solution 100 mL (100 mLs Intravenous Contrast Given 11/26/22 0140)  pantoprazole (PROTONIX) injection 40 mg (40 mg Intravenous Given 11/26/22 0226)  lactated ringers bolus 1,000 mL (1,000 mLs Intravenous New Bag/Given 11/26/22 0228)  HYDROmorphone (DILAUDID) injection 0.5 mg (0.5 mg Intravenous Given 11/26/22 0225)     Procedures  /  Critical Care Procedures  ED Course and Medical Decision Making  Initial Impression and Ddx History of pancreatitis though patient's pain is right upper quadrant this evening which is a bit odd for his prior presentations.  And so differential diagnosis also includes biliary colic, cholecystitis, peptic ulcer disease, perforated viscus.  Past medical/surgical history that increases complexity of ED encounter: Pancreatitis  Interpretation of Diagnostics I personally reviewed the laboratory assessment and my interpretation is as follows: Lipase greater than 400, otherwise no significant blood count or electrolyte disturbance.   Patient Reassessment and Ultimate Disposition/Management Clinical Course as of 11/26/22 0427  Sat Nov 26, 2022  0219 CT reveals duodenitis, possibly some mild pancreatitis as well.  Patient still having some discomfort, will provide further symptomatic management and  reassess. [MB]    Clinical Course User Index [MB] Maudie Flakes, MD     Patient feeling a lot better, normal vital signs, reassuring abdominal exam, appropriate for a trial of outpatient management with home pain regimen, dietary restrictions.  Patient management required discussion with the following services or consulting groups:  None  Complexity of Problems Addressed Acute illness or injury that poses threat of life of bodily function  Additional Data Reviewed and Analyzed Further history obtained from: Recent discharge summary  Additional Factors Impacting ED Encounter Risk Use of parenteral controlled substances and Consideration of hospitalization  Barth Kirks. [place] Small, [place]  [facility] [email or web address]  Final Clinical Impressions(s) / ED Diagnoses     ICD-10-CM   1. Right upper quadrant abdominal pain  R10.11     2. Duodenitis  K29.80       ED Discharge Orders          Ordered    oxyCODONE (ROXICODONE) 5 MG immediate release tablet  Every 4 hours PRN        11/26/22 0425             Discharge Instructions Discussed with and Provided to Patient:     Discharge Instructions      You were evaluated in the Emergency Department and after careful evaluation, we did not find any emergent condition requiring admission or further testing in the hospital.  Your exam/testing today was overall reassuring.  Symptoms seem to be due to some inflammation to your intestines and possibly also your pancreas.  Use Tylenol 1000 mg every 4-6 hours for pain.  Use the oxycodone medication for more significant pain.  Restrict your diet as we discussed.  Plenty fluids and rest.  Recommend follow-up with a gastroenterologist.  Please return to the Emergency Department if you experience any worsening of your condition.  Thank you for allowing [place] to be a part of your care.        Maudie Flakes, MD 11/26/22 [phone]

## 2022-11-26 NOTE — Discharge Instructions (Addendum)
You were evaluated in the Emergency Department and after careful evaluation, we did not find any emergent condition requiring admission or further testing in the hospital.  Your exam/testing today was overall reassuring.  Symptoms seem to be due to some inflammation to your intestines and possibly also your pancreas.  Use Tylenol 1000 mg every 4-6 hours for pain.  Use the oxycodone medication for more significant pain.  Restrict your diet as we discussed.  Plenty fluids and rest.  Recommend follow-up with a gastroenterologist.  Please return to the Emergency Department if you experience any worsening of your condition.  Thank you for allowing [place] to be a part of your care.

## 2022-12-30 ENCOUNTER — Encounter (HOSPITAL_COMMUNITY): Payer: Self-pay

## 2022-12-30 ENCOUNTER — Emergency Department (HOSPITAL_BASED_OUTPATIENT_CLINIC_OR_DEPARTMENT_OTHER): Payer: Medicaid Other

## 2022-12-30 ENCOUNTER — Encounter (HOSPITAL_BASED_OUTPATIENT_CLINIC_OR_DEPARTMENT_OTHER): Payer: Self-pay

## 2022-12-30 ENCOUNTER — Inpatient Hospital Stay (HOSPITAL_BASED_OUTPATIENT_CLINIC_OR_DEPARTMENT_OTHER)
Admission: EM | Admit: 2022-12-30 | Discharge: 2023-01-04 | DRG: 439 | Disposition: A | Discharge status: Home / Self Care | Payer: Medicaid Other | Attending: Internal Medicine | Admitting: Internal Medicine

## 2022-12-30 ENCOUNTER — Other Ambulatory Visit: Payer: Self-pay

## 2022-12-30 DIAGNOSIS — Z8249 Family history of ischemic heart disease and other diseases of the circulatory system: Secondary | ICD-10-CM

## 2022-12-30 DIAGNOSIS — Z833 Family history of diabetes mellitus: Secondary | ICD-10-CM

## 2022-12-30 DIAGNOSIS — Z803 Family history of malignant neoplasm of breast: Secondary | ICD-10-CM | POA: Diagnosis not present

## 2022-12-30 DIAGNOSIS — Z91030 Bee allergy status: Secondary | ICD-10-CM

## 2022-12-30 DIAGNOSIS — Z79899 Other long term (current) drug therapy: Secondary | ICD-10-CM

## 2022-12-30 DIAGNOSIS — K861 Other chronic pancreatitis: Secondary | ICD-10-CM | POA: Diagnosis present

## 2022-12-30 DIAGNOSIS — K5900 Constipation, unspecified: Secondary | ICD-10-CM | POA: Diagnosis not present

## 2022-12-30 DIAGNOSIS — I10 Essential (primary) hypertension: Secondary | ICD-10-CM | POA: Diagnosis present

## 2022-12-30 DIAGNOSIS — R1033 Periumbilical pain: Secondary | ICD-10-CM | POA: Diagnosis present

## 2022-12-30 DIAGNOSIS — K863 Pseudocyst of pancreas: Secondary | ICD-10-CM | POA: Diagnosis present

## 2022-12-30 DIAGNOSIS — K859 Acute pancreatitis without necrosis or infection, unspecified: Secondary | ICD-10-CM | POA: Diagnosis present

## 2022-12-30 DIAGNOSIS — Z91038 Other insect allergy status: Secondary | ICD-10-CM

## 2022-12-30 DIAGNOSIS — Z87891 Personal history of nicotine dependence: Secondary | ICD-10-CM | POA: Diagnosis not present

## 2022-12-30 DIAGNOSIS — F1011 Alcohol abuse, in remission: Secondary | ICD-10-CM | POA: Diagnosis present

## 2022-12-30 LAB — CBC
HCT: 41.7 % (ref 39.0–52.0)
Hemoglobin: 14.1 g/dL (ref 13.0–17.0)
MCH: 29.3 pg (ref 26.0–34.0)
MCHC: 33.8 g/dL (ref 30.0–36.0)
MCV: 86.5 fL (ref 80.0–100.0)
Platelets: 228 10*3/uL (ref 150–400)
RBC: 4.82 MIL/uL (ref 4.22–5.81)
RDW: 12.7 % (ref 11.5–15.5)
WBC: 5.7 10*3/uL (ref 4.0–10.5)
nRBC: 0 % (ref 0.0–0.2)

## 2022-12-30 LAB — COMPREHENSIVE METABOLIC PANEL
ALT: 20 U/L (ref 0–44)
AST: 22 U/L (ref 15–41)
Albumin: 4 g/dL (ref 3.5–5.0)
Alkaline Phosphatase: 65 U/L (ref 38–126)
Anion gap: 10 (ref 5–15)
BUN: 13 mg/dL (ref 6–20)
CO2: 23 mmol/L (ref 22–32)
Calcium: 9.1 mg/dL (ref 8.9–10.3)
Chloride: 105 mmol/L (ref 98–111)
Creatinine, Ser: 0.99 mg/dL (ref 0.61–1.24)
GFR, Estimated: >60 mL/min (ref >60)
Glucose, Bld: 134 mg/dL — ABNORMAL HIGH (ref 70–99)
Potassium: 3.7 mmol/L (ref 3.5–5.1)
Sodium: 138 mmol/L (ref 135–145)
Total Bilirubin: 0.5 mg/dL (ref 0.3–1.2)
Total Protein: 7.6 g/dL (ref 6.5–8.1)

## 2022-12-30 LAB — LIPASE, BLOOD: Lipase: 3455 U/L — ABNORMAL HIGH (ref 11–51)

## 2022-12-30 MED ORDER — ONDANSETRON HCL 4 MG/2ML IJ SOLN
4.0000 mg | Freq: Once | INTRAMUSCULAR | Status: AC
  Administered 2022-12-30: 4 mg via INTRAVENOUS
  Filled 2022-12-30: qty 2

## 2022-12-30 MED ORDER — MORPHINE SULFATE (PF) 4 MG/ML IV SOLN
4.0000 mg | Freq: Once | INTRAVENOUS | Status: AC
  Administered 2022-12-30: 4 mg via INTRAVENOUS
  Filled 2022-12-30: qty 1

## 2022-12-30 MED ORDER — HYDROMORPHONE HCL 1 MG/ML IJ SOLN
1.0000 mg | Freq: Once | INTRAMUSCULAR | Status: AC
  Administered 2022-12-30: 1 mg via INTRAVENOUS
  Filled 2022-12-30: qty 1

## 2022-12-30 MED ORDER — SODIUM CHLORIDE 0.9 % IV BOLUS
1000.0000 mL | Freq: Once | INTRAVENOUS | Status: AC
  Administered 2022-12-30: 1000 mL via INTRAVENOUS

## 2022-12-30 MED ORDER — IOHEXOL 300 MG/ML  SOLN
100.0000 mL | Freq: Once | INTRAMUSCULAR | Status: AC | PRN
  Administered 2022-12-30: 100 mL via INTRAVENOUS

## 2022-12-30 MED ORDER — LACTATED RINGERS IV SOLN: INTRAVENOUS | Status: DC

## 2022-12-30 NOTE — ED Notes (Addendum)
Pt reminded of the need for a urine sample. Pt unable to provide sample at time time but will use call bell when he is able to.

## 2022-12-30 NOTE — ED Provider Notes (Signed)
[facility] Provider Note   CSN: [id number] Arrival date & time: 12/30/22  1425     History  Chief Complaint  Patient presents with   Abdominal Pain    Max Ayers is a 52 y.o. male.  Patient presents the emergency department complaining of periumbilical pain which began this morning.  Patient also complains of nausea with no vomiting.  Patient states he has a history of recent episodes of pancreatitis and initially believes that this may be another pancreatitis flare.  Upon further discussion the patient states that this pain does seem somewhat different as his previous pancreatitis pain has been more epigastric in nature.  The patient denies alcohol use.  He states that in a month or so he has a GI appointment for further evaluation of his epigastric pain.  Patient currently denies diarrhea, constipation, vomiting, chest pain, shortness of breath, urinary symptoms.  Past medical history significant for pancreatitis, hypertension, pancreatic pseudocyst, history of alcohol abuse  HPI     Home Medications Prior to Admission medications   Medication Sig Start Date End Date Taking? Authorizing Provider  hydrALAZINE (APRESOLINE) 100 MG tablet Take 1 tablet (100 mg total) by mouth every 8 (eight) hours. 11/22/22   Little Ishikawa, MD  Hyprom-Naphaz-Polysorb-Zn Sulf (CLEAR EYES COMPLETE) SOLN Place 1 drop into both eyes 3 (three) times daily as needed (for irritation).    [provider]  labetalol (NORMODYNE) 100 MG tablet Take 1 tablet (100 mg total) by mouth 2 (two) times daily. 11/22/22   Little Ishikawa, MD  oxyCODONE (ROXICODONE) 5 MG immediate release tablet Take 1 tablet (5 mg total) by mouth every 4 (four) hours as needed for severe pain. 11/26/22   Maudie Flakes, MD  oxyCODONE-acetaminophen (PERCOCET/ROXICET) 5-325 MG tablet Take by mouth every 8 (eight) hours as needed for severe pain.    [provider]   pantoprazole (PROTONIX) 40 MG tablet Take 1 tablet (40 mg total) by mouth at bedtime. Patient taking differently: Take 40 mg by mouth daily as needed (for reflux). 09/11/22 12/10/22  [place] Story J, DO  TYLENOL 500 MG tablet Take 500-1,000 mg by mouth every 8 (eight) hours as needed for headache or mild pain.    [provider]      Allergies    Yellow jacket venom [bee venom]    Review of Systems   Review of Systems  Gastrointestinal:  Positive for abdominal pain and nausea. Negative for constipation, diarrhea and vomiting.    Physical Exam Updated Vital Signs BP (!) 171/81   Pulse 61   Temp 98 F (36.7 C)   Resp 19   Ht '5\' 11"'$  (1.803 m)   Wt 88.5 kg   SpO2 95%   BMI 27.20 kg/m  Physical Exam Vitals and nursing note reviewed.  Constitutional:      General: He is not in acute distress.    Appearance: He is well-developed.  HENT:     Head: Normocephalic and atraumatic.  Eyes:     Conjunctiva/sclera: Conjunctivae normal.  Cardiovascular:     Rate and Rhythm: Normal rate and regular rhythm.     Heart sounds: No murmur heard. Pulmonary:     Effort: Pulmonary effort is normal. No respiratory distress.     Breath sounds: Normal breath sounds.  Abdominal:     Palpations: Abdomen is soft.     Tenderness: There is abdominal tenderness in the right lower quadrant, periumbilical area, suprapubic area  and left lower quadrant.  Musculoskeletal:        General: No swelling.     Cervical back: Neck supple.  Skin:    General: Skin is warm and dry.     Capillary Refill: Capillary refill takes less than 2 seconds.  Neurological:     Mental Status: He is alert.  Psychiatric:        Mood and Affect: Mood normal.     ED Results / Procedures / Treatments   Labs (all labs ordered are listed, but only abnormal results are displayed) Labs Reviewed  LIPASE, BLOOD - Abnormal; Notable for the following components:      Result Value   Lipase 3,455 (*)    All other  components within normal limits  COMPREHENSIVE METABOLIC PANEL - Abnormal; Notable for the following components:   Glucose, Bld 134 (*)    All other components within normal limits  CBC  URINALYSIS, ROUTINE W REFLEX MICROSCOPIC    EKG None  Radiology CT ABDOMEN PELVIS W CONTRAST  Result Date: 12/30/2022 CLINICAL DATA:  Abdominal pain, acute, nonlocalized. History of pancreatitis. Nausea. EXAM: CT ABDOMEN AND PELVIS WITH CONTRAST TECHNIQUE: Multidetector CT imaging of the abdomen and pelvis was performed using the standard protocol following bolus administration of intravenous contrast. RADIATION DOSE REDUCTION: This exam was performed according to the departmental dose-optimization program which includes automated exposure control, adjustment of the mA and/or kV according to patient size and/or use of iterative reconstruction technique. CONTRAST:  162m OMNIPAQUE IOHEXOL 300 MG/ML  SOLN COMPARISON:  CT abdomen/pelvis 11/26/2022. FINDINGS: Lower chest: No acute abnormality. Hepatobiliary: No focal liver abnormality is seen. No gallstones, gallbladder wall thickening, or biliary dilatation. Pancreas: Extensive peripancreatic inflammation along the head, body and proximal tail. No discrete fluid collection. Unchanged 1.7 by 1.1 cm cystic lesion in the pancreatic head. Spleen: Normal. Adrenals/Urinary Tract: Adrenal glands are unremarkable. Kidneys are normal, without renal calculi, focal lesion, or hydronephrosis. Bladder is unremarkable. Stomach/Bowel: Normal distal esophagus. Secondary inflammation of the distal stomach and proximal duodenum with associated mild distention of the gastric fundus. Small bowel loops are nondilated. Normal appendix is visualized on image 45 series 2. Colonic diverticulosis without surrounding inflammation. Redundant sigmoid colon. Normal rectum. Vascular/Lymphatic: Aortic atherosclerosis. No enlarged abdominal or pelvic lymph nodes. Reproductive: Prostate is unremarkable.  Other: No abdominal wall hernia or abnormality. No abdominopelvic ascites. Musculoskeletal: No suspicious bone lesions. IMPRESSION: 1. Acute, uncomplicated pancreatitis. 2. Unchanged 1.7 x 1.1 cm cystic lesion in the pancreatic head. 3. Colonic diverticulosis without evidence of acute diverticulitis. Electronically Signed   By: WEmmit AlexandersM.D.   On: 12/30/2022 16:18    Procedures Procedures    Medications Ordered in ED Medications  morphine (PF) 4 MG/ML injection 4 mg (4 mg Intravenous Given 12/30/22 1544)  ondansetron (ZOFRAN) injection 4 mg (4 mg Intravenous Given 12/30/22 1543)  iohexol (OMNIPAQUE) 300 MG/ML solution 100 mL (100 mLs Intravenous Contrast Given 12/30/22 1553)  sodium chloride 0.9 % bolus 1,000 mL (1,000 mLs Intravenous New Bag/Given 12/30/22 1732)  HYDROmorphone (DILAUDID) injection 1 mg (1 mg Intravenous Given 12/30/22 1728)    ED Course/ Medical Decision Making/ A&P                             Medical Decision Making Amount and/or Complexity of Data Reviewed Labs: ordered. Radiology: ordered.  Risk Prescription drug management.   This patient presents to the ED for concern of abdominal pain, this  involves an extensive number of treatment options, and is a complaint that carries with it a high risk of complications and morbidity.  The differential diagnosis includes acute on chronic pancreatitis, cholecystitis, appendicitis, colitis, diverticulitis, and others   Co morbidities that complicate the patient evaluation  History of pancreatitis, hypertension   Additional history obtained:  External records from outside source obtained and reviewed including CT scan results from December 16 showing wall thickening and inflammatory stranding around the duodenum concerning for duodenitis.  Questional inflammation surrounding the uncinate process of the pancreas.  Cystic lesion in the head of the pancreas (similar to previous studies)   Lab Tests:  I Ordered, and  personally interpreted labs.  The pertinent results include: Grossly unremarkable CBC, CMP, lipase 3455   Imaging Studies ordered:  I ordered imaging studies including CT abdomen pelvis with contrast I independently visualized and interpreted imaging which showed  1. Acute, uncomplicated pancreatitis.  2. Unchanged 1.7 x 1.1 cm cystic lesion in the pancreatic head.  3. Colonic diverticulosis without evidence of acute diverticulitis   I agree with the radiologist interpretation   Consultations Obtained:  I requested consultation with the hospitalist, Dr.Thomas and discussed lab and imaging findings as well as pertinent plan - they recommend: admission   Problem List / ED Course / Critical interventions / Medication management   I ordered medication including morphine for pain, Zofran for nausea Reevaluation of the patient after these medicines showed that the patient improved I have reviewed the patients home medicines and have made adjustments as needed   Test / Admission - Considered:  Patient's lipase is significantly elevated at 3455.  CT scan shows acute, uncomplicated pancreatitis with an unchanged cystic lesion.  Patient needs admission for continued pain and nausea control along with IV fluids        Final Clinical Impression(s) / ED Diagnoses Final diagnoses:  Acute on chronic pancreatitis [facility])    Rx / DC Orders ED Discharge Orders     None         Ronny Bacon 12/30/22 [place], DO 12/31/22 2148

## 2022-12-30 NOTE — ED Notes (Signed)
Unable to obtain urine specimen, but aware of the need for a sample.

## 2022-12-30 NOTE — ED Triage Notes (Signed)
Pt reports onset of abdominal pain this morning worsening as the day goes on.  Has hx of pancreatitis.  Does not use alcohol or smoke cigarettes.  Last flare was in December.  Nausea, no vomiting or diarrhea.

## 2022-12-31 ENCOUNTER — Encounter (HOSPITAL_COMMUNITY): Payer: Self-pay | Admitting: Internal Medicine

## 2022-12-31 DIAGNOSIS — K861 Other chronic pancreatitis: Secondary | ICD-10-CM

## 2022-12-31 DIAGNOSIS — K859 Acute pancreatitis without necrosis or infection, unspecified: Secondary | ICD-10-CM

## 2022-12-31 DIAGNOSIS — F1011 Alcohol abuse, in remission: Secondary | ICD-10-CM

## 2022-12-31 DIAGNOSIS — K863 Pseudocyst of pancreas: Secondary | ICD-10-CM

## 2022-12-31 DIAGNOSIS — I10 Essential (primary) hypertension: Secondary | ICD-10-CM | POA: Diagnosis not present

## 2022-12-31 LAB — URINALYSIS, ROUTINE W REFLEX MICROSCOPIC
Bilirubin Urine: NEGATIVE
Glucose, UA: NEGATIVE mg/dL
Hgb urine dipstick: NEGATIVE
Ketones, ur: 5 mg/dL — AB
Leukocytes,Ua: NEGATIVE
Nitrite: NEGATIVE
Protein, ur: NEGATIVE mg/dL
Specific Gravity, Urine: >1.046 — ABNORMAL HIGH (ref 1.005–1.030)
pH: 5 (ref 5.0–8.0)

## 2022-12-31 LAB — COMPREHENSIVE METABOLIC PANEL
ALT: 17 U/L (ref 0–44)
AST: 18 U/L (ref 15–41)
Albumin: 3.5 g/dL (ref 3.5–5.0)
Alkaline Phosphatase: 53 U/L (ref 38–126)
Anion gap: 12 (ref 5–15)
BUN: 11 mg/dL (ref 6–20)
CO2: 23 mmol/L (ref 22–32)
Calcium: 9 mg/dL (ref 8.9–10.3)
Chloride: 105 mmol/L (ref 98–111)
Creatinine, Ser: 0.87 mg/dL (ref 0.61–1.24)
GFR, Estimated: >60 mL/min (ref >60)
Glucose, Bld: 117 mg/dL — ABNORMAL HIGH (ref 70–99)
Potassium: 4 mmol/L (ref 3.5–5.1)
Sodium: 140 mmol/L (ref 135–145)
Total Bilirubin: 0.6 mg/dL (ref 0.3–1.2)
Total Protein: 6.4 g/dL — ABNORMAL LOW (ref 6.5–8.1)

## 2022-12-31 LAB — CBC WITH DIFFERENTIAL/PLATELET
Abs Immature Granulocytes: 0.01 10*3/uL (ref 0.00–0.07)
Basophils Absolute: 0 10*3/uL (ref 0.0–0.1)
Basophils Relative: 0 %
Eosinophils Absolute: 0 10*3/uL (ref 0.0–0.5)
Eosinophils Relative: 0 %
HCT: 39.5 % (ref 39.0–52.0)
Hemoglobin: 13 g/dL (ref 13.0–17.0)
Immature Granulocytes: 0 %
Lymphocytes Relative: 31 %
Lymphs Abs: 1.7 10*3/uL (ref 0.7–4.0)
MCH: 29 pg (ref 26.0–34.0)
MCHC: 32.9 g/dL (ref 30.0–36.0)
MCV: 88 fL (ref 80.0–100.0)
Monocytes Absolute: 0.2 10*3/uL (ref 0.1–1.0)
Monocytes Relative: 4 %
Neutro Abs: 3.5 10*3/uL (ref 1.7–7.7)
Neutrophils Relative %: 65 %
Platelets: 203 10*3/uL (ref 150–400)
RBC: 4.49 MIL/uL (ref 4.22–5.81)
RDW: 13.1 % (ref 11.5–15.5)
WBC: 5.4 10*3/uL (ref 4.0–10.5)
nRBC: 0 % (ref 0.0–0.2)

## 2022-12-31 LAB — LIPID PANEL
Cholesterol: 182 mg/dL (ref 0–200)
HDL: 38 mg/dL — ABNORMAL LOW (ref >40)
LDL Cholesterol: 133 mg/dL — ABNORMAL HIGH (ref 0–99)
Total CHOL/HDL Ratio: 4.8 RATIO
Triglycerides: 57 mg/dL (ref <150)
VLDL: 11 mg/dL (ref 0–40)

## 2022-12-31 LAB — RAPID URINE DRUG SCREEN, HOSP PERFORMED
Amphetamines: NOT DETECTED
Barbiturates: NOT DETECTED
Benzodiazepines: NOT DETECTED
Cocaine: NOT DETECTED
Opiates: POSITIVE — AB
Tetrahydrocannabinol: NOT DETECTED

## 2022-12-31 LAB — HIV ANTIBODY (ROUTINE TESTING W REFLEX): HIV Screen 4th Generation wRfx: NONREACTIVE

## 2022-12-31 LAB — LIPASE, BLOOD: Lipase: 770 U/L — ABNORMAL HIGH (ref 11–51)

## 2022-12-31 LAB — ETHANOL: Alcohol, Ethyl (B): <10 mg/dL (ref <10)

## 2022-12-31 MED ORDER — LACTATED RINGERS IV SOLN: INTRAVENOUS | Status: DC

## 2022-12-31 MED ORDER — ONDANSETRON HCL 4 MG/2ML IJ SOLN
4.0000 mg | Freq: Four times a day (QID) | INTRAMUSCULAR | Status: DC | PRN
  Administered 2022-12-31 – 2023-01-04 (×5): 4 mg via INTRAVENOUS
  Filled 2022-12-31 (×5): qty 2

## 2022-12-31 MED ORDER — PANTOPRAZOLE SODIUM 40 MG IV SOLR
40.0000 mg | INTRAVENOUS | Status: DC
  Administered 2022-12-31 – 2023-01-01 (×2): 40 mg via INTRAVENOUS
  Filled 2022-12-31 (×2): qty 10

## 2022-12-31 MED ORDER — ONDANSETRON HCL 4 MG PO TABS: 4.0000 mg | ORAL_TABLET | Freq: Four times a day (QID) | ORAL | Status: DC | PRN

## 2022-12-31 MED ORDER — HYDROMORPHONE HCL 1 MG/ML IJ SOLN
1.0000 mg | INTRAMUSCULAR | Status: DC | PRN
  Administered 2022-12-31 – 2023-01-01 (×8): 1 mg via INTRAVENOUS
  Filled 2022-12-31 (×8): qty 1

## 2022-12-31 MED ORDER — ACETAMINOPHEN 650 MG RE SUPP: 650.0000 mg | Freq: Four times a day (QID) | RECTAL | Status: DC | PRN

## 2022-12-31 MED ORDER — MELATONIN 5 MG PO TABS: 10.0000 mg | ORAL_TABLET | Freq: Every evening | ORAL | Status: DC | PRN

## 2022-12-31 MED ORDER — LABETALOL HCL 200 MG PO TABS
100.0000 mg | ORAL_TABLET | Freq: Two times a day (BID) | ORAL | Status: DC
  Administered 2022-12-31 – 2023-01-04 (×8): 100 mg via ORAL
  Filled 2022-12-31 (×8): qty 1

## 2022-12-31 MED ORDER — ONDANSETRON HCL 4 MG/2ML IJ SOLN
4.0000 mg | Freq: Once | INTRAMUSCULAR | Status: AC
  Administered 2022-12-31: 4 mg via INTRAVENOUS
  Filled 2022-12-31: qty 2

## 2022-12-31 MED ORDER — ACETAMINOPHEN 325 MG PO TABS: 650.0000 mg | ORAL_TABLET | Freq: Four times a day (QID) | ORAL | Status: DC | PRN

## 2022-12-31 MED ORDER — SENNA 8.6 MG PO TABS
1.0000 | ORAL_TABLET | Freq: Two times a day (BID) | ORAL | Status: DC
  Administered 2022-12-31 – 2023-01-03 (×7): 8.6 mg via ORAL
  Filled 2022-12-31 (×7): qty 1

## 2022-12-31 MED ORDER — HEPARIN SODIUM (PORCINE) 5000 UNIT/ML IJ SOLN
5000.0000 [IU] | Freq: Three times a day (TID) | INTRAMUSCULAR | Status: DC
  Administered 2022-12-31 – 2023-01-01 (×5): 5000 [IU] via SUBCUTANEOUS
  Filled 2022-12-31 (×5): qty 1

## 2022-12-31 MED ORDER — HYDROMORPHONE HCL 1 MG/ML IJ SOLN
1.0000 mg | Freq: Once | INTRAMUSCULAR | Status: AC
  Administered 2022-12-31: 1 mg via INTRAVENOUS
  Filled 2022-12-31: qty 1

## 2022-12-31 MED ORDER — HYDRALAZINE HCL 50 MG PO TABS
100.0000 mg | ORAL_TABLET | Freq: Three times a day (TID) | ORAL | Status: DC
  Administered 2022-12-31 – 2023-01-04 (×13): 100 mg via ORAL
  Filled 2022-12-31 (×14): qty 2

## 2022-12-31 NOTE — Progress Notes (Signed)
[facility] PLAN OF CARE NOTE Patient: Max Ayers FOA:[id number]   PCP: Willeen Niece, Elida DOB: 06-Dec-1971   DOA: 12/30/2022   DOS: 12/31/2022    Patient was admitted by my colleague earlier on 12/31/2022. I have reviewed the H&P as well as assessment and plan and agree with the same. Important changes in the plan are listed below.  Plan of care: Principal Problem:   Acute on chronic pancreatitis ([place]) Active Problems:   Benign essential HTN   History of alcohol abuse   Pancreatic pseudocyst Continues to have abdominal pain.  Continues to have some nausea.  Currently remains n.p.o. for now.  Continue with IV fluids for now. Recommend outpatient GI follow-up with possible consideration for EUS versus early MRI pancreatic protocol followed by EUS. Level of care: Telemetry Continue telemetry.  Pancreatitis.  Author: Berle Mull, MD [facility] 12/31/2022 5:57 PM   If 7PM-7AM, please contact night-coverage at [email or web address]

## 2022-12-31 NOTE — Assessment & Plan Note (Signed)
Admit to med telemetry bed.  Continue with IV fluids.  NPO.  IV Dilaudid 1 mg every 2 hours as needed pain.  Continue with IV Protonix.  Repeat lipase.  Given his multiple bouts of pancreatitis without any alcohol ingestion, will need to rule out idiopathic pancreatitis.  Check IgG4.  However it is likely that his greasy food ingestion is likely the cause of his recent bout of pancreatitis.  He will have an EGD performed in March 2024.  It may benefit him from getting endoscopic ultrasound as well to better characterize his pseudocyst.  Defer this to his outpatient GI doctor.

## 2022-12-31 NOTE — Assessment & Plan Note (Signed)
Seen multiple times on CT and MRCP.  Patient has a outpatient GI doctor in [place] (Dr. Eusebio Friendly).  He has a EGD scheduled for March 2024.  He may benefit from endoscopic ultrasound at that time as well.

## 2022-12-31 NOTE — Assessment & Plan Note (Signed)
Patient states that he is on hydralazine 100 mg 3 times daily and labetalol 100 mg twice daily.  He states that this medication makes him feel odd when he takes it.  He wants to follow-up with his primary care doctor to discuss the side effects.

## 2022-12-31 NOTE — Assessment & Plan Note (Signed)
Stable.  Patient states that he has been abstinent from alcohol for about 2 years now.

## 2022-12-31 NOTE — Subjective & Objective (Signed)
CC: abd pain HPI: 52 year old African-American male history of hypertension, prior history of alcohol abuse (abstinent since 2021), history of prior tobacco abuse (abstinent since 2021) presents to the ER today with abdominal pain x 2 days.  He states that on Wednesday evening, he had a rice bowl from [employer].  He states that the chicken was very greasy and the rice was very spicy and greasy.  He states that he was very hungry but he ate it anyway.  Several hours later*developing some abdominal discomfort.  Abdominal pain increased he presented to the ER with worsening abdominal pain.  He states that this has happened before when he is eating greasy food and has had exacerbation of his pancreatitis.  He states that he is not drinking any alcohol.  Lipase was elevated at 3455.  CT abdomen pelvis demonstrated extensive peripancreatic inflammation.  There is an unchanged 1.7 x 1.1 cystic lesion in the pancreatic head.  His MRI of his abdomen back in July 2023 also showed the same pancreatic pseudocyst.  Patient's been seen by Ludwig Lean [facility] [place].  Patient set up for a EGD in March 2024.  [facility] contacted for admission.

## 2022-12-31 NOTE — H&P (Signed)
History and Physical    Max Ayers [id number]:[id number] DOB: 1971/05/11 DOA: 12/30/2022  DOS: the patient was seen and examined on 12/30/2022  PCP: Willeen Niece, PA   Patient coming from: Home  I have personally briefly reviewed patient's old medical records in [place]  CC: abd pain HPI: 52 year old African-American male history of hypertension, prior history of alcohol abuse (abstinent since 2021), history of prior tobacco abuse (abstinent since 2021) presents to the ER today with abdominal pain x 2 days.  He states that on Wednesday evening, he had a rice bowl from [employer].  He states that the chicken was very greasy and the rice was very spicy and greasy.  He states that he was very hungry but he ate it anyway.  Several hours later*developing some abdominal discomfort.  Abdominal pain increased he presented to the ER with worsening abdominal pain.  He states that this has happened before when he is eating greasy food and has had exacerbation of his pancreatitis.  He states that he is not drinking any alcohol.  Lipase was elevated at 3455.  CT abdomen pelvis demonstrated extensive peripancreatic inflammation.  There is an unchanged 1.7 x 1.1 cystic lesion in the pancreatic head.  His MRI of his abdomen back in July 2023 also showed the same pancreatic pseudocyst.  Patient's been seen by Ludwig Lean [facility] in [place].  Patient set up for a EGD in March 2024.  [facility] hospital[facility] contacted for admission.   ED Course: CT abd shows acute pancreatitis  Review of Systems:  Review of Systems  Constitutional: Negative.   HENT: Negative.    Eyes: Negative.   Respiratory: Negative.    Cardiovascular: Negative.   Gastrointestinal:  Positive for abdominal pain, nausea and vomiting. Negative for constipation and diarrhea.  Genitourinary: Negative.   Musculoskeletal: Negative.   Skin: Negative.   Endo/Heme/Allergies: Negative.   Psychiatric/Behavioral:  Negative.    All other systems reviewed and are negative.   Past Medical History:  Diagnosis Date   Acute alcoholic pancreatitis 81/85/6314   Hypertension    Pancreatitis    Tobacco use 12/12/2020    Past Surgical History:  Procedure Laterality Date   NO PAST SURGERIES       reports that he has quit smoking. His smoking use included cigarettes and cigars. He smoked an average of 1 pack per day. He has never used smokeless tobacco. He reports that he does not currently use alcohol. He reports that he does not use drugs.  Allergies  Allergen Reactions   Yellow Jacket Venom [Bee Venom] Anaphylaxis    Family History  Problem Relation Age of Onset   Diabetes Mother    Hypertension Father    Breast cancer Maternal Grandmother    Colon cancer Neg Hx    Stomach cancer Neg Hx    Liver disease Neg Hx    Pancreatic cancer Neg Hx    Esophageal cancer Neg Hx    Rectal cancer Neg Hx     Prior to Admission medications   Medication Sig Start Date End Date Taking? Authorizing Provider  hydrALAZINE (APRESOLINE) 100 MG tablet Take 1 tablet (100 mg total) by mouth every 8 (eight) hours. 11/22/22   Little Ishikawa, MD  Hyprom-Naphaz-Polysorb-Zn Sulf (CLEAR EYES COMPLETE) SOLN Place 1 drop into both eyes 3 (three) times daily as needed (for irritation).    [provider]  labetalol (NORMODYNE) 100 MG tablet Take 1 tablet (100 mg total) by mouth 2 (two)  times daily. 11/22/22   Little Ishikawa, MD  oxyCODONE (ROXICODONE) 5 MG immediate release tablet Take 1 tablet (5 mg total) by mouth every 4 (four) hours as needed for severe pain. 11/26/22   Maudie Flakes, MD  oxyCODONE-acetaminophen (PERCOCET/ROXICET) 5-325 MG tablet Take by mouth every 8 (eight) hours as needed for severe pain.    [provider]  pantoprazole (PROTONIX) 40 MG tablet Take 1 tablet (40 mg total) by mouth at bedtime. Patient taking differently: Take 40 mg by mouth daily as needed (for reflux).  09/11/22 12/10/22  [place] Ladislav J, DO  TYLENOL 500 MG tablet Take 500-1,000 mg by mouth every 8 (eight) hours as needed for headache or mild pain.    [provider]    Physical Exam: Vitals:   12/30/22 1745 12/30/22 1953 12/30/22 2000 12/30/22 2208  BP:  (!) 166/87 (!) 172/84 (!) 154/82  Pulse: 61 66 60 60  Resp:  '13 11 16  '$ Temp:  (!) 97.5 F (36.4 C)  98.7 F (37.1 C)  TempSrc:  Oral  Oral  SpO2: 95% 95% 96% 95%  Weight:      Height:        Physical Exam Vitals and nursing note reviewed.  Constitutional:      General: He is not in acute distress.    Appearance: Normal appearance. He is normal weight. He is not ill-appearing, toxic-appearing or diaphoretic.  HENT:     Head: Normocephalic and atraumatic.     Nose: Nose normal.  Cardiovascular:     Rate and Rhythm: Normal rate and regular rhythm.     Pulses: Normal pulses.  Pulmonary:     Effort: Pulmonary effort is normal.     Breath sounds: Normal breath sounds.  Abdominal:     General: Abdomen is flat. Bowel sounds are normal. There is no distension.     Palpations: Abdomen is soft.     Tenderness: There is abdominal tenderness in the epigastric area. There is guarding. There is no rebound.     Comments: Mild guarding periumbilical  Musculoskeletal:     Right lower leg: No edema.     Left lower leg: No edema.  Skin:    General: Skin is warm and dry.     Capillary Refill: Capillary refill takes less than 2 seconds.  Neurological:     General: No focal deficit present.     Mental Status: He is alert and oriented to person, place, and time.      Labs on Admission: I have personally reviewed following labs and imaging studies  CBC: Recent Labs  Lab 12/30/22 1438  WBC 5.7  HGB 14.1  HCT 41.7  MCV 86.5  PLT 379   Basic Metabolic Panel: Recent Labs  Lab 12/30/22 1438  NA 138  K 3.7  CL 105  CO2 23  GLUCOSE 134*  BUN 13  CREATININE 0.99  CALCIUM 9.1   GFR: Estimated Creatinine Clearance:  95.1 mL/min (by C-G formula based on SCr of 0.99 mg/dL). Liver Function Tests: Recent Labs  Lab 12/30/22 1438  AST 22  ALT 20  ALKPHOS 65  BILITOT 0.5  PROT 7.6  ALBUMIN 4.0   Recent Labs  Lab 12/30/22 1438  LIPASE 3,455*   No results for input(s): "AMMONIA" in the last 168 hours. Coagulation Profile: No results for input(s): "INR", "PROTIME" in the last 168 hours. Cardiac Enzymes: No results for input(s): "CKTOTAL", "CKMB", "CKMBINDEX", "TROPONINI", "TROPONINIHS" in the last 168 hours. BNP (  last 3 results) No results for input(s): "PROBNP" in the last 8760 hours. HbA1C: No results for input(s): "HGBA1C" in the last 72 hours. CBG: No results for input(s): "GLUCAP" in the last 168 hours. Lipid Profile: No results for input(s): "CHOL", "HDL", "LDLCALC", "TRIG", "CHOLHDL", "LDLDIRECT" in the last 72 hours. Thyroid Function Tests: No results for input(s): "TSH", "T4TOTAL", "FREET4", "T3FREE", "THYROIDAB" in the last 72 hours. Anemia Panel: No results for input(s): "VITAMINB12", "FOLATE", "FERRITIN", "TIBC", "IRON", "RETICCTPCT" in the last 72 hours. Urine analysis:    Component Value Date/Time   COLORURINE YELLOW 12/31/2022 0008   APPEARANCEUR CLEAR 12/31/2022 0008   LABSPEC >1.046 (H) 12/31/2022 0008   PHURINE 5.0 12/31/2022 0008   GLUCOSEU NEGATIVE 12/31/2022 0008   HGBUR NEGATIVE 12/31/2022 0008   BILIRUBINUR NEGATIVE 12/31/2022 0008   KETONESUR 5 (A) 12/31/2022 0008   PROTEINUR NEGATIVE 12/31/2022 0008   UROBILINOGEN 0.2 10/27/2011 0957   NITRITE NEGATIVE 12/31/2022 0008   LEUKOCYTESUR NEGATIVE 12/31/2022 0008    Radiological Exams on Admission: I have personally reviewed images CT ABDOMEN PELVIS W CONTRAST  Result Date: 12/30/2022 CLINICAL DATA:  Abdominal pain, acute, nonlocalized. History of pancreatitis. Nausea. EXAM: CT ABDOMEN AND PELVIS WITH CONTRAST TECHNIQUE: Multidetector CT imaging of the abdomen and pelvis was performed using the standard protocol  following bolus administration of intravenous contrast. RADIATION DOSE REDUCTION: This exam was performed according to the departmental dose-optimization program which includes automated exposure control, adjustment of the mA and/or kV according to patient size and/or use of iterative reconstruction technique. CONTRAST:  157m OMNIPAQUE IOHEXOL 300 MG/ML  SOLN COMPARISON:  CT abdomen/pelvis 11/26/2022. FINDINGS: Lower chest: No acute abnormality. Hepatobiliary: No focal liver abnormality is seen. No gallstones, gallbladder wall thickening, or biliary dilatation. Pancreas: Extensive peripancreatic inflammation along the head, body and proximal tail. No discrete fluid collection. Unchanged 1.7 by 1.1 cm cystic lesion in the pancreatic head. Spleen: Normal. Adrenals/Urinary Tract: Adrenal glands are unremarkable. Kidneys are normal, without renal calculi, focal lesion, or hydronephrosis. Bladder is unremarkable. Stomach/Bowel: Normal distal esophagus. Secondary inflammation of the distal stomach and proximal duodenum with associated mild distention of the gastric fundus. Small bowel loops are nondilated. Normal appendix is visualized on image 45 series 2. Colonic diverticulosis without surrounding inflammation. Redundant sigmoid colon. Normal rectum. Vascular/Lymphatic: Aortic atherosclerosis. No enlarged abdominal or pelvic lymph nodes. Reproductive: Prostate is unremarkable. Other: No abdominal wall hernia or abnormality. No abdominopelvic ascites. Musculoskeletal: No suspicious bone lesions. IMPRESSION: 1. Acute, uncomplicated pancreatitis. 2. Unchanged 1.7 x 1.1 cm cystic lesion in the pancreatic head. 3. Colonic diverticulosis without evidence of acute diverticulitis. Electronically Signed   By: WEmmit AlexandersM.D.   On: 12/30/2022 16:18    EKG: My personal interpretation of EKG shows: no EKG to review  Assessment/Plan Principal Problem:   Acute on chronic pancreatitis ([facility] Active Problems:   Benign  essential HTN   History of alcohol abuse   Pancreatic pseudocyst   Assessment and Plan: * Acute on chronic pancreatitis ([facility] Admit to med telemetry bed.  Continue with IV fluids.  NPO.  IV Dilaudid 1 mg every 2 hours as needed pain.  Continue with IV Protonix.  Repeat lipase.  Given his multiple bouts of pancreatitis without any alcohol ingestion, will need to rule out idiopathic pancreatitis.  Check IgG4.  However it is likely that his greasy food ingestion is likely the cause of his recent bout of pancreatitis.  He will have an EGD performed in March 2024.  It may benefit him from  getting endoscopic ultrasound as well to better characterize his pseudocyst.  Defer this to his outpatient GI doctor.  Pancreatic pseudocyst Seen multiple times on CT and MRCP.  Patient has a outpatient GI doctor in [place] (Dr. Eusebio Friendly).  He has a EGD scheduled for March 2024.  He may benefit from endoscopic ultrasound at that time as well.  History of alcohol abuse Stable.  Patient states that he has been abstinent from alcohol for about 2 years now.  Benign essential HTN Patient states that he is on hydralazine 100 mg 3 times daily and labetalol 100 mg twice daily.  He states that this medication makes him feel odd when he takes it.  He wants to follow-up with his primary care doctor to discuss the side effects.   DVT prophylaxis: SQ Heparin Code Status: Full Code Family Communication: no family at bedside  Disposition Plan: return home  Consults called: none  Admission status: Observation, Telemetry bed   Kristopher Oppenheim, DO [facility] 12/31/2022, 1:36 AM

## 2023-01-01 DIAGNOSIS — K859 Acute pancreatitis without necrosis or infection, unspecified: Secondary | ICD-10-CM | POA: Diagnosis not present

## 2023-01-01 DIAGNOSIS — K861 Other chronic pancreatitis: Secondary | ICD-10-CM | POA: Diagnosis not present

## 2023-01-01 LAB — COMPREHENSIVE METABOLIC PANEL
ALT: 13 U/L (ref 0–44)
AST: 16 U/L (ref 15–41)
Albumin: 3.3 g/dL — ABNORMAL LOW (ref 3.5–5.0)
Alkaline Phosphatase: 58 U/L (ref 38–126)
Anion gap: 6 (ref 5–15)
BUN: 6 mg/dL (ref 6–20)
CO2: 28 mmol/L (ref 22–32)
Calcium: 8.5 mg/dL — ABNORMAL LOW (ref 8.9–10.3)
Chloride: 106 mmol/L (ref 98–111)
Creatinine, Ser: 0.78 mg/dL (ref 0.61–1.24)
GFR, Estimated: >60 mL/min (ref >60)
Glucose, Bld: 93 mg/dL (ref 70–99)
Potassium: 3.3 mmol/L — ABNORMAL LOW (ref 3.5–5.1)
Sodium: 140 mmol/L (ref 135–145)
Total Bilirubin: 0.5 mg/dL (ref 0.3–1.2)
Total Protein: 6.3 g/dL — ABNORMAL LOW (ref 6.5–8.1)

## 2023-01-01 LAB — CBC
HCT: 38 % — ABNORMAL LOW (ref 39.0–52.0)
Hemoglobin: 12.7 g/dL — ABNORMAL LOW (ref 13.0–17.0)
MCH: 29.1 pg (ref 26.0–34.0)
MCHC: 33.4 g/dL (ref 30.0–36.0)
MCV: 87.2 fL (ref 80.0–100.0)
Platelets: 186 10*3/uL (ref 150–400)
RBC: 4.36 MIL/uL (ref 4.22–5.81)
RDW: 13 % (ref 11.5–15.5)
WBC: 4.9 10*3/uL (ref 4.0–10.5)
nRBC: 0 % (ref 0.0–0.2)

## 2023-01-01 LAB — LIPASE, BLOOD: Lipase: 298 U/L — ABNORMAL HIGH (ref 11–51)

## 2023-01-01 LAB — MAGNESIUM: Magnesium: 1.9 mg/dL (ref 1.7–2.4)

## 2023-01-01 MED ORDER — PANTOPRAZOLE SODIUM 40 MG PO TBEC
40.0000 mg | DELAYED_RELEASE_TABLET | Freq: Every day | ORAL | Status: DC
  Administered 2023-01-02 – 2023-01-04 (×3): 40 mg via ORAL
  Filled 2023-01-01 (×3): qty 1

## 2023-01-01 MED ORDER — ENOXAPARIN SODIUM 40 MG/0.4ML IJ SOSY
40.0000 mg | PREFILLED_SYRINGE | INTRAMUSCULAR | Status: DC
  Administered 2023-01-01 – 2023-01-03 (×3): 40 mg via SUBCUTANEOUS
  Filled 2023-01-01 (×3): qty 0.4

## 2023-01-01 MED ORDER — OXYCODONE HCL 5 MG PO TABS
5.0000 mg | ORAL_TABLET | ORAL | Status: DC | PRN
  Administered 2023-01-01: 5 mg via ORAL
  Filled 2023-01-01: qty 1

## 2023-01-01 MED ORDER — POTASSIUM CHLORIDE CRYS ER 20 MEQ PO TBCR
40.0000 meq | EXTENDED_RELEASE_TABLET | ORAL | Status: AC
  Administered 2023-01-01 (×2): 40 meq via ORAL
  Filled 2023-01-01 (×2): qty 2

## 2023-01-01 MED ORDER — HYDROMORPHONE HCL 1 MG/ML IJ SOLN
0.5000 mg | INTRAMUSCULAR | Status: DC | PRN
  Administered 2023-01-01 – 2023-01-03 (×15): 0.5 mg via INTRAVENOUS
  Filled 2023-01-01 (×15): qty 0.5

## 2023-01-01 NOTE — Progress Notes (Signed)
[facility] Progress Note Patient: Max Ayers TZG:[id number] DOB: 01-Mar-1971 DOA: 12/30/2022  DOS: the patient was seen and examined on 01/01/2023  Brief hospital course: PMH of HTN, recurrent pancreatitis, remote history of alcohol and tobacco abuse presented to hospital with complaints of abdominal pain.  Found to have acute pancreatitis. Assessment and Plan: Acute on chronic pancreatitis. Lipase level was 3000 at the time of admission. Now improving. Treated with IV fluid IV pain medication. Will advance to full liquid diet for now as the patient tolerated clear liquids well. Lipase level improving. IgG4 pending. Patient had an MRCP in the past which showed possible pancreatic cyst. Due to recurrent pancreatitis patient may benefit from EUS with biopsy outpatient. Follows up with outpatient GI and was actually scheduled for an EGD. Management outpatient.  History of alcohol abuse. Patient claims he is abstained from alcohol for almost 2 years now. Monitor.  HTN. Blood pressure stable on home regimen. Monitor    Subjective: Abdominal pain still present but improving.  Passing gas.  No BM.  No fever no chills.  No nausea no vomiting.  Physical Exam: General: in Mild distress, No Rash Cardiovascular: S1 and S2 Present, No Murmur Respiratory: Good respiratory effort, Bilateral Air entry present. No Crackles, No wheezes Abdomen: Bowel Sound present, diffusely tenderness Extremities: No edema Neuro: Alert and oriented x3, no new focal deficit  Data Reviewed: I have Reviewed nursing notes, Vitals, and Lab results. Since last encounter, pertinent lab results CBC and BMP   . I have ordered test including CBC and BMP  .   Disposition: Status is: Inpatient Remains inpatient appropriate because: Need for pain control and monitor for diet tolerance.  enoxaparin (LOVENOX) injection 40 mg Start: 01/01/23 2200 SCDs Start: 12/31/22 0408   Family Communication: No one at  bedside Level of care: Telemetry Continue telemetry for pancreatitis.  Switch to MedSurg tomorrow. Vitals:   12/31/22 1827 12/31/22 1857 12/31/22 2036 01/01/23 0451  BP:   (!) 150/81 135/75  Pulse:   91 78  Resp: '20 15 15 '$ (!) 21  Temp:   98.9 F (37.2 C) 98.4 F (36.9 C)  TempSrc:   Oral Oral  SpO2:   97% 98%  Weight:    90.8 kg  Height:         Author: Berle Mull, MD 01/01/2023 2:12 PM  Please look on [email or web address] to find out who is on call.

## 2023-01-01 NOTE — Hospital Course (Addendum)
PMH of HTN, recurrent pancreatitis, remote history of alcohol and tobacco abuse presented to hospital with complaints of abdominal pain.  Found to have acute pancreatitis.  GI consulted due to ongoing pain.

## 2023-01-02 DIAGNOSIS — K859 Acute pancreatitis without necrosis or infection, unspecified: Secondary | ICD-10-CM | POA: Diagnosis not present

## 2023-01-02 DIAGNOSIS — K861 Other chronic pancreatitis: Secondary | ICD-10-CM | POA: Diagnosis not present

## 2023-01-02 LAB — COMPREHENSIVE METABOLIC PANEL
ALT: 14 U/L (ref 0–44)
AST: 17 U/L (ref 15–41)
Albumin: 3.5 g/dL (ref 3.5–5.0)
Alkaline Phosphatase: 58 U/L (ref 38–126)
Anion gap: 8 (ref 5–15)
BUN: <5 mg/dL — ABNORMAL LOW (ref 6–20)
CO2: 25 mmol/L (ref 22–32)
Calcium: 8.7 mg/dL — ABNORMAL LOW (ref 8.9–10.3)
Chloride: 105 mmol/L (ref 98–111)
Creatinine, Ser: 0.85 mg/dL (ref 0.61–1.24)
GFR, Estimated: >60 mL/min (ref >60)
Glucose, Bld: 94 mg/dL (ref 70–99)
Potassium: 3.5 mmol/L (ref 3.5–5.1)
Sodium: 138 mmol/L (ref 135–145)
Total Bilirubin: 0.4 mg/dL (ref 0.3–1.2)
Total Protein: 6.5 g/dL (ref 6.5–8.1)

## 2023-01-02 LAB — MAGNESIUM: Magnesium: 1.9 mg/dL (ref 1.7–2.4)

## 2023-01-02 LAB — CBC
HCT: 36.5 % — ABNORMAL LOW (ref 39.0–52.0)
Hemoglobin: 12.2 g/dL — ABNORMAL LOW (ref 13.0–17.0)
MCH: 29.1 pg (ref 26.0–34.0)
MCHC: 33.4 g/dL (ref 30.0–36.0)
MCV: 87.1 fL (ref 80.0–100.0)
Platelets: 186 10*3/uL (ref 150–400)
RBC: 4.19 MIL/uL — ABNORMAL LOW (ref 4.22–5.81)
RDW: 13.2 % (ref 11.5–15.5)
WBC: 4.8 10*3/uL (ref 4.0–10.5)
nRBC: 0 % (ref 0.0–0.2)

## 2023-01-02 LAB — LIPASE, BLOOD: Lipase: 113 U/L — ABNORMAL HIGH (ref 11–51)

## 2023-01-02 NOTE — Progress Notes (Signed)
[facility] Progress Note Patient: Max Ayers ZOX:[id number] DOB: 12-16-1970 DOA: 12/30/2022  DOS: the patient was seen and examined on 01/02/2023  Brief hospital course: PMH of HTN, recurrent pancreatitis, remote history of alcohol and tobacco abuse presented to hospital with complaints of abdominal pain.  Found to have acute pancreatitis. Assessment and Plan: Acute on chronic pancreatitis. Lipase level was 3000 at the time of admission. Now improving. Treated with IV fluid IV pain medication. Continue full liquid diet for now. Lipase level improving. IgG4 pending. Patient had an MRCP in the past which showed possible pancreatic cyst. Due to recurrent pancreatitis patient may benefit from EUS with biopsy outpatient. Abdominal pain still present.  Discussed with [facility].  Appreciate consultation.  MRCP ordered.  History of alcohol abuse. Patient claims he is abstained from alcohol for almost 2 years now. Monitor.  HTN. Blood pressure stable on home regimen. Monitor    Subjective: No change abdominal pain.  No nausea no vomiting.  No fever no chills.  Passing gas but no BM.  Physical Exam: General: in Mild distress, No Rash Cardiovascular: S1 and S2 Present, No Murmur Respiratory: Good respiratory effort, Bilateral Air entry present. No Crackles, No wheezes Abdomen: Bowel Sound present, mild central abdominal tenderness Extremities: No edema Neuro: Alert and oriented x3, no new focal deficit  Data Reviewed: I have Reviewed nursing notes, Vitals, and Lab results. Since last encounter, pertinent lab results CBC and CMP   . I have ordered test including CBC and CMP  .    Disposition: Status is: Inpatient Remains inpatient appropriate because: Improvement in abdominal pain and diet tolerance enoxaparin (LOVENOX) injection 40 mg Start: 01/01/23 2200 SCDs Start: 12/31/22 0408   Family Communication: No one at bedside Level of care: Telemetry Continue telemetry for  pancreatitis.   Vitals:   01/01/23 2103 01/02/23 0500 01/02/23 0540 01/02/23 1153  BP: (!) 150/86  (!) 142/86 (!) 153/96  Pulse: 83  67 76  Resp: (!) 22  (!) 22 19  Temp: 99.1 F (37.3 C)  98 F (36.7 C) 98.3 F (36.8 C)  TempSrc: Oral  Oral Oral  SpO2: 97%  98% 97%  Weight:  93 kg    Height:         Author: Berle Mull, MD 01/02/2023 6:21 PM  Please look on [email or web address] to find out who is on call.

## 2023-01-02 NOTE — Consult Note (Signed)
[facility] Gastroenterology Consult  Referring Provider: No ref. provider found Primary Care Physician:  Willeen Niece, [place] Primary Gastroenterologist: [facility], unassigned  Reason for Consultation: recurrent pancreatitis  SUBJECTIVE:   HPI: Max Ayers is a 52 y.o. male with past medical history significant for hypertension. He noted having episodes of pancreatitis that began in 2021, initially these were ascribed to being from alcohol use. He previously consumed roughly 1-2 beers per day. He has discontinued all alcohol use roughly 8 months prior, though he has had recurrent pancreatitis episodes. He previously smoked, though discontinued use roughly 2 years prior.   On presentation, labs showed Na 138, K 3.7, AST/ALT 22/20, ALP 65, total bilirubin 0.5, lipase 3,455, triglyceride 57, WBC 5.7, Hgb 14.1, PLT 228. CT scan of abdomen and pelvis with IV contrast on 12/30/22 showed no gallstones, extensive peripancreatic inflammation along the head/body/tail, no discrete fluid collection, 1.7 x 1.1 cm cystic lesion in pancreatic head, colonic diverticulosis.  Last colonoscopy completed on 06/02/21 by Dr. Rush Landmark for colorectal cancer screening with findings of 2-7 mm transverse and cecum polyp (pathology returned as lyomyoma), ascending colon and cecum diverticulosis, internal hemorrhoids.   An outpatient MRCP was ordered in July 2023, though not completed. He denied metal in his body, no claustrophobia. IgG4 testing is pending. He noted having outpatient endoscopic ultrasound scheduled with [facility] on 01/18/23.  Past Medical History:  Diagnosis Date   Acute alcoholic pancreatitis 89/38/1017   Hypertension    Pancreatitis    Tobacco use 12/12/2020   Past Surgical History:  Procedure Laterality Date   NO PAST SURGERIES     Prior to Admission medications   Medication Sig Start Date End Date Taking? Authorizing Provider  hydrALAZINE (APRESOLINE) 100 MG tablet Take 1 tablet (100 mg  total) by mouth every 8 (eight) hours. Patient taking differently: Take 100 mg by mouth 3 (three) times daily. 11/22/22  Yes Little Ishikawa, MD  Hyprom-Naphaz-Polysorb-Zn Sulf (CLEAR EYES COMPLETE) SOLN Place 2 drops into both eyes as needed (for irritation).   Yes [provider]  labetalol (NORMODYNE) 100 MG tablet Take 1 tablet (100 mg total) by mouth 2 (two) times daily. 11/22/22  Yes Little Ishikawa, MD  pantoprazole (PROTONIX) 40 MG tablet Take 1 tablet (40 mg total) by mouth at bedtime. Patient taking differently: Take 40 mg by mouth daily as needed (for reflux). 09/11/22 12/31/22 Yes [place] Eddi J, DO  TYLENOL 500 MG tablet Take 1,000 mg by mouth as needed for headache or mild pain.   Yes [provider]  oxyCODONE (ROXICODONE) 5 MG immediate release tablet Take 1 tablet (5 mg total) by mouth every 4 (four) hours as needed for severe pain. Patient not taking: Reported on 12/31/2022 11/26/22   Maudie Flakes, MD  oxyCODONE-acetaminophen (PERCOCET/ROXICET) 5-325 MG tablet Take by mouth every 8 (eight) hours as needed for severe pain. Patient not taking: Reported on 12/31/2022    [provider]   Current Facility-Administered Medications  Medication Dose Route Frequency Provider Last Rate Last Admin   acetaminophen (TYLENOL) tablet 650 mg  650 mg Oral Q6H PRN Kristopher Oppenheim, DO       Or   acetaminophen (TYLENOL) suppository 650 mg  650 mg Rectal Q6H PRN Kristopher Oppenheim, DO       enoxaparin (LOVENOX) injection 40 mg  40 mg Subcutaneous Q24H Lavina Hamman, MD   40 mg at 01/01/23 2156   hydrALAZINE (APRESOLINE) tablet 100 mg  100 mg Oral Q8H Kristopher Oppenheim,  DO   100 mg at 01/02/23 1428   HYDROmorphone (DILAUDID) injection 0.5 mg  0.5 mg Intravenous Q2H PRN Lavina Hamman, MD   0.5 mg at 01/02/23 1428   labetalol (NORMODYNE) tablet 100 mg  100 mg Oral BID Kristopher Oppenheim, DO   100 mg at 01/02/23 1950   melatonin tablet 10 mg  10 mg Oral QHS PRN Kristopher Oppenheim, DO        ondansetron [facility]) tablet 4 mg  4 mg Oral Q6H PRN Kristopher Oppenheim, DO       Or   ondansetron [facility]) injection 4 mg  4 mg Intravenous Q6H PRN Kristopher Oppenheim, DO   4 mg at 01/01/23 1101   oxyCODONE (Oxy IR/ROXICODONE) immediate release tablet 5 mg  5 mg Oral Q4H PRN Lavina Hamman, MD   5 mg at 01/01/23 1930   pantoprazole (PROTONIX) EC tablet 40 mg  40 mg Oral Daily Lavina Hamman, MD   40 mg at 01/02/23 9326   senna (SENOKOT) tablet 8.6 mg  1 tablet Oral BID Kristopher Oppenheim, DO   8.6 mg at 01/02/23 0734   Allergies as of 12/30/2022 - Review Complete 12/30/2022  Allergen Reaction Noted   Yellow jacket venom [bee venom] Anaphylaxis 08/16/2020   Family History  Problem Relation Age of Onset   Diabetes Mother    Hypertension Father    Breast cancer Maternal Grandmother    Colon cancer Neg Hx    Stomach cancer Neg Hx    Liver disease Neg Hx    Pancreatic cancer Neg Hx    Esophageal cancer Neg Hx    Rectal cancer Neg Hx    Social History   Socioeconomic History   Marital status: Married    Spouse name: Not on file   Number of children: Not on file   Years of education: Not on file   Highest education level: Not on file  Occupational History   Not on file  Tobacco Use   Smoking status: Former    Packs/day: 1.00    Types: Cigarettes, Cigars   Smokeless tobacco: Never  Vaping Use   Vaping Use: Every day   Substances: Flavoring  Substance and Sexual Activity   Alcohol use: Not Currently    Comment: occ   Drug use: No   Sexual activity: Yes    Birth control/protection: Condom  Other Topics Concern   Not on file  Social History Narrative   Not on file   Social Determinants of Health   Financial Resource Strain: Not on file  Food Insecurity: No Food Insecurity (12/30/2022)   Hunger Vital Sign    Worried About Running Out of Food in the Last Year: Never true    Ran Out of Food in the Last Year: Never true  Transportation Needs: No Transportation Needs (12/30/2022)   PRAPARE -  Hydrologist (Medical): No    Lack of Transportation (Non-Medical): No  Physical Activity: Not on file  Stress: Not on file  Social Connections: Not on file  Intimate Partner Violence: Not At Risk (12/30/2022)   Humiliation, Afraid, Rape, and Kick questionnaire    Fear of Current or Ex-Partner: No    Emotionally Abused: No    Physically Abused: No    Sexually Abused: No   Review of Systems:  Review of Systems  Respiratory:  Positive for shortness of breath (2/2 abdominal pain when taking deep breath).   Cardiovascular:  Negative for chest pain.  Gastrointestinal:  Positive for abdominal pain, nausea and vomiting. Negative for blood in stool, constipation and diarrhea.    OBJECTIVE:   Temp:  [98 F (36.7 C)-99.1 F (37.3 C)] 98.3 F (36.8 C) (01/22 1153) Pulse Rate:  [67-86] 76 (01/22 1153) Resp:  [19-22] 19 (01/22 1153) BP: (142-153)/(80-96) 153/96 (01/22 1153) SpO2:  [96 %-98 %] 97 % (01/22 1153) Weight:  [93 kg] 93 kg (01/22 0500) Last BM Date : 12/30/22 Physical Exam Constitutional:      General: He is not in acute distress.    Appearance: He is not ill-appearing, toxic-appearing or diaphoretic.  Cardiovascular:     Rate and Rhythm: Normal rate and regular rhythm.  Pulmonary:     Effort: No respiratory distress.     Breath sounds: Normal breath sounds.  Abdominal:     General: Bowel sounds are normal. There is no distension.     Palpations: Abdomen is soft.     Tenderness: There is abdominal tenderness. There is no guarding.  Musculoskeletal:     Right lower leg: No edema.     Left lower leg: No edema.  Skin:    General: Skin is warm and dry.  Neurological:     Mental Status: He is alert.     Labs: Recent Labs    12/31/22 0506 01/01/23 0536 01/02/23 0428  WBC 5.4 4.9 4.8  HGB 13.0 12.7* 12.2*  HCT 39.5 38.0* 36.5*  PLT 203 186 186   BMET Recent Labs    12/31/22 0506 01/01/23 0536 01/02/23 0428  NA 140 140 138  K 4.0  3.3* 3.5  CL 105 106 105  CO2 '23 28 25  '$ GLUCOSE 117* 93 94  BUN 11 6 <5*  CREATININE 0.87 0.78 0.85  CALCIUM 9.0 8.5* 8.7*   LFT Recent Labs    01/02/23 0428  PROT 6.5  ALBUMIN 3.5  AST 17  ALT 14  ALKPHOS 58  BILITOT 0.4   PT/INR No results for input(s): "LABPROT", "INR" in the last 72 hours.  Diagnostic imaging: No results found.  IMPRESSION: Recurrent pancreatitis Pancreatic head cystic lesion EtOH use, previous, abstinent x 8 months Tobacco use, previous, abstinent x 2 years Personal history colon polyp  PLAN: -Recommend MRCP with and without contrast -Follow up IgG4 testing -Ok for low fat diet as tolerated -Keep appointment with [facility] for endoscopic ultrasound on 01/18/23 -Supportive care for acute pancreatitis including IV fluids, pain control -[facility] will follow   LOS: 3 days   Danton Clap, [facility] Gastroenterology

## 2023-01-03 ENCOUNTER — Inpatient Hospital Stay (HOSPITAL_COMMUNITY): Payer: Medicaid Other

## 2023-01-03 DIAGNOSIS — K859 Acute pancreatitis without necrosis or infection, unspecified: Secondary | ICD-10-CM | POA: Diagnosis not present

## 2023-01-03 DIAGNOSIS — K861 Other chronic pancreatitis: Secondary | ICD-10-CM | POA: Diagnosis not present

## 2023-01-03 LAB — CBC
HCT: 38 % — ABNORMAL LOW (ref 39.0–52.0)
Hemoglobin: 12.4 g/dL — ABNORMAL LOW (ref 13.0–17.0)
MCH: 28.5 pg (ref 26.0–34.0)
MCHC: 32.6 g/dL (ref 30.0–36.0)
MCV: 87.4 fL (ref 80.0–100.0)
Platelets: 191 10*3/uL (ref 150–400)
RBC: 4.35 MIL/uL (ref 4.22–5.81)
RDW: 13.2 % (ref 11.5–15.5)
WBC: 4.6 10*3/uL (ref 4.0–10.5)
nRBC: 0 % (ref 0.0–0.2)

## 2023-01-03 LAB — COMPREHENSIVE METABOLIC PANEL
ALT: 20 U/L (ref 0–44)
AST: 23 U/L (ref 15–41)
Albumin: 3.5 g/dL (ref 3.5–5.0)
Alkaline Phosphatase: 61 U/L (ref 38–126)
Anion gap: 7 (ref 5–15)
BUN: 7 mg/dL (ref 6–20)
CO2: 26 mmol/L (ref 22–32)
Calcium: 8.7 mg/dL — ABNORMAL LOW (ref 8.9–10.3)
Chloride: 103 mmol/L (ref 98–111)
Creatinine, Ser: 0.92 mg/dL (ref 0.61–1.24)
GFR, Estimated: >60 mL/min (ref >60)
Glucose, Bld: 113 mg/dL — ABNORMAL HIGH (ref 70–99)
Potassium: 3.5 mmol/L (ref 3.5–5.1)
Sodium: 136 mmol/L (ref 135–145)
Total Bilirubin: 0.3 mg/dL (ref 0.3–1.2)
Total Protein: 6.8 g/dL (ref 6.5–8.1)

## 2023-01-03 LAB — MAGNESIUM: Magnesium: 2 mg/dL (ref 1.7–2.4)

## 2023-01-03 LAB — IGG 4: IgG, Subclass 4: 23 mg/dL (ref 2–96)

## 2023-01-03 MED ORDER — AMLODIPINE BESYLATE 10 MG PO TABS
5.0000 mg | ORAL_TABLET | Freq: Every day | ORAL | Status: DC
  Administered 2023-01-03 – 2023-01-04 (×2): 5 mg via ORAL
  Filled 2023-01-03 (×2): qty 1

## 2023-01-03 MED ORDER — OXYCODONE HCL 5 MG PO TABS
10.0000 mg | ORAL_TABLET | ORAL | Status: DC | PRN
  Administered 2023-01-03 – 2023-01-04 (×3): 10 mg via ORAL
  Filled 2023-01-03 (×3): qty 2

## 2023-01-03 MED ORDER — GADOBUTROL 1 MMOL/ML IV SOLN
8.0000 mL | Freq: Once | INTRAVENOUS | Status: AC | PRN
  Administered 2023-01-03: 8 mL via INTRAVENOUS

## 2023-01-03 MED ORDER — SENNOSIDES-DOCUSATE SODIUM 8.6-50 MG PO TABS
2.0000 | ORAL_TABLET | Freq: Two times a day (BID) | ORAL | Status: DC
  Administered 2023-01-03 – 2023-01-04 (×2): 2 via ORAL
  Filled 2023-01-03 (×2): qty 2

## 2023-01-03 MED ORDER — POLYETHYLENE GLYCOL 3350 17 G PO PACK
17.0000 g | PACK | Freq: Every day | ORAL | Status: DC
  Administered 2023-01-03: 17 g via ORAL
  Filled 2023-01-03 (×2): qty 1

## 2023-01-03 MED ORDER — LACTULOSE 10 GM/15ML PO SOLN
10.0000 g | Freq: Three times a day (TID) | ORAL | Status: AC
  Administered 2023-01-03 (×2): 10 g via ORAL
  Filled 2023-01-03 (×2): qty 30

## 2023-01-03 MED ORDER — OXYCODONE HCL 5 MG PO TABS
5.0000 mg | ORAL_TABLET | ORAL | Status: DC | PRN
  Administered 2023-01-04: 5 mg via ORAL

## 2023-01-03 MED ORDER — HYDROMORPHONE HCL 1 MG/ML IJ SOLN: 0.5000 mg | Freq: Two times a day (BID) | INTRAMUSCULAR | Status: DC | PRN

## 2023-01-03 NOTE — Progress Notes (Signed)
[facility] Progress Note Patient: Max Ayers GHW:[id number] DOB: 1971-10-01 DOA: 12/30/2022  DOS: the patient was seen and examined on 01/03/2023  Brief hospital course: PMH of HTN, recurrent pancreatitis, remote history of alcohol and tobacco abuse presented to hospital with complaints of abdominal pain.  Found to have acute pancreatitis. Assessment and Plan: Acute on chronic pancreatitis. Lipase level was 3000 at the time of admission. Now improving. Treated with IV fluid IV pain medication. Switching from full liquid diet to heart healthy diet. Lipase level improving. IgG4 negative. Appreciate [facility] consultation.  Outpatient EUS will be scheduled. Switching patient from IV Dilaudid which he is using extensively to pills only for pain control.  Constipation. Treating aggressively with medications for bowel regimen.  History of alcohol abuse. Patient claims he is abstained from alcohol for almost 2 years now. Monitor.   HTN. Blood pressure stable on home regimen. Monitor    Subjective: Reports nausea.  Passing gas but no BM.  No fever no chills.  Abdominal pain still present but improving.  Physical Exam: General: in Mild distress, No Rash Cardiovascular: S1 and S2 Present, No Murmur Respiratory: Good respiratory effort, Bilateral Air entry present. No Crackles, No wheezes Abdomen: Bowel Sound present, diffusely tender in the central abdominal area Extremities: No edema Neuro: Alert and oriented x3, no new focal deficit  Data Reviewed: I have Reviewed nursing notes, Vitals, and Lab results. Since last encounter, pertinent lab results BMP   . I have ordered test including BMP  . I have discussed pt's care plan and test results with GI  .   Disposition: Status is: Inpatient Remains inpatient appropriate because: Need for pain control  enoxaparin (LOVENOX) injection 40 mg Start: 01/01/23 2200 SCDs Start: 12/31/22 0408   Family Communication: No one at  bedside Level of care: Telemetry Switch to MedSurg. Vitals:   01/02/23 2033 01/03/23 0500 01/03/23 0516 01/03/23 1217  BP: (!) 155/89  (!) 144/69 (!) 157/83  Pulse: 82  69 76  Resp: '20  17 16  '$ Temp: 98.4 F (36.9 C)  98.1 F (36.7 C) 98.4 F (36.9 C)  TempSrc: Oral  Oral Oral  SpO2: 93%  96% 97%  Weight:  90.9 kg    Height:         Author: Berle Mull, MD 01/03/2023 5:35 PM  Please look on [email or web address] to find out who is on call.

## 2023-01-03 NOTE — Progress Notes (Signed)
[facility] Progress Note  SUBJECTIVE:   Interval history: Max Ayers was seen and evaluated today at bedside.  Resting comfortably in bed, having returned from MRCP.  Noted that his abdominal pain continues though is reduced in intensity.  He was able to tolerate some oral intake last evening.  Has not had a bowel movement despite MiraLAX use.  Past Medical History:  Diagnosis Date   Acute alcoholic pancreatitis 85/63/1497   Hypertension    Pancreatitis    Tobacco use 12/12/2020   Past Surgical History:  Procedure Laterality Date   NO PAST SURGERIES     Current Facility-Administered Medications  Medication Dose Route Frequency Provider Last Rate Last Admin   acetaminophen (TYLENOL) tablet 650 mg  650 mg Oral Q6H PRN Kristopher Oppenheim, DO       Or   acetaminophen (TYLENOL) suppository 650 mg  650 mg Rectal Q6H PRN Kristopher Oppenheim, DO       enoxaparin (LOVENOX) injection 40 mg  40 mg Subcutaneous Q24H Lavina Hamman, MD   40 mg at 01/02/23 2100   hydrALAZINE (APRESOLINE) tablet 100 mg  100 mg Oral Q8H Kristopher Oppenheim, DO   100 mg at 01/03/23 0263   HYDROmorphone (DILAUDID) injection 0.5 mg  0.5 mg Intravenous Q2H PRN Lavina Hamman, MD   0.5 mg at 01/03/23 1240   labetalol (NORMODYNE) tablet 100 mg  100 mg Oral BID Kristopher Oppenheim, DO   100 mg at 01/03/23 0915   melatonin tablet 10 mg  10 mg Oral QHS PRN Kristopher Oppenheim, DO       ondansetron [facility] tablet 4 mg  4 mg Oral Q6H PRN Kristopher Oppenheim, DO       Or   ondansetron [facility]) injection 4 mg  4 mg Intravenous Q6H PRN Kristopher Oppenheim, DO   4 mg at 01/03/23 7858   oxyCODONE (Oxy IR/ROXICODONE) immediate release tablet 5 mg  5 mg Oral Q4H PRN Lavina Hamman, MD   5 mg at 01/01/23 1930   pantoprazole (PROTONIX) EC tablet 40 mg  40 mg Oral Daily Lavina Hamman, MD   40 mg at 01/03/23 0915   senna (SENOKOT) tablet 8.6 mg  1 tablet Oral BID Kristopher Oppenheim, DO   8.6 mg at 01/03/23 0915   Allergies as of 12/30/2022 - Review Complete 12/30/2022  Allergen  Reaction Noted   Yellow jacket venom [bee venom] Anaphylaxis 08/16/2020   Review of Systems:  Review of Systems  Gastrointestinal:  Positive for abdominal pain and constipation.    OBJECTIVE:   Temp:  [98.1 F (36.7 C)-98.4 F (36.9 C)] 98.4 F (36.9 C) (01/23 1217) Pulse Rate:  [69-82] 76 (01/23 1217) Resp:  [16-20] 16 (01/23 1217) BP: (144-157)/(69-89) 157/83 (01/23 1217) SpO2:  [93 %-97 %] 97 % (01/23 1217) Weight:  [90.9 kg] 90.9 kg (01/23 0500) Last BM Date : 12/30/22 Physical Exam Constitutional:      General: He is not in acute distress.    Appearance: He is not ill-appearing, toxic-appearing or diaphoretic.  Cardiovascular:     Rate and Rhythm: Normal rate and regular rhythm.  Pulmonary:     Effort: No respiratory distress.     Breath sounds: Normal breath sounds.  Abdominal:     General: Bowel sounds are normal. There is no distension.     Palpations: Abdomen is soft.     Tenderness: There is abdominal tenderness (Diffuse).  Neurological:     Mental Status: He is alert.  Labs: Recent Labs    01/01/23 0536 01/02/23 0428 01/03/23 0417  WBC 4.9 4.8 4.6  HGB 12.7* 12.2* 12.4*  HCT 38.0* 36.5* 38.0*  PLT 186 186 191   BMET Recent Labs    01/01/23 0536 01/02/23 0428 01/03/23 0417  NA 140 138 136  K 3.3* 3.5 3.5  CL 106 105 103  CO2 '28 25 26  '$ GLUCOSE 93 94 113*  BUN 6 <5* 7  CREATININE 0.78 0.85 0.92  CALCIUM 8.5* 8.7* 8.7*   LFT Recent Labs    01/03/23 0417  PROT 6.8  ALBUMIN 3.5  AST 23  ALT 20  ALKPHOS 61  BILITOT 0.3   PT/INR No results for input(s): "LABPROT", "INR" in the last 72 hours. Diagnostic imaging: MR ABDOMEN MRCP W WO CONTAST  Result Date: 01/03/2023 CLINICAL DATA:  Chronic pancreatitis and pancreatic cystic lesion. Acute pancreatitis. EXAM: MRI ABDOMEN WITHOUT AND WITH CONTRAST (INCLUDING MRCP) TECHNIQUE: Multiplanar multisequence MR imaging of the abdomen was performed both before and after the administration of  intravenous contrast. Heavily T2-weighted images of the biliary and pancreatic ducts were obtained, and three-dimensional MRCP images were rendered by post processing. Patient experience nausea after the contrast injection. CONTRAST:  22m GADAVIST GADOBUTROL 1 MMOL/ML IV SOLN COMPARISON:  Multiple exams, including CT abdomen 12/30/2022 and MRI 07/04/2022 FINDINGS: Lower chest: Mild cardiomegaly. Hepatobiliary: Unremarkable Pancreas: Subtle peripancreatic edema although accounting for the difference in modality, this appears distention improved compared to 12/30/2022. No findings of pancreatic necrosis or abscess. Complex cystic lesion of the pancreatic head measuring 1.7 by 1.3 by 1.6 cm (volume = 1.9 cm^3) as on image 52 series 17 and image 14 series 4. There is internal debris as shown on image 14 series 4, but no internal enhancement. 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 No new lesions identified. Spleen:  Unremarkable Adrenals/Urinary Tract:  Unremarkable Stomach/Bowel: Unremarkable Vascular/Lymphatic:  Unremarkable Other:  No supplemental non-categorized findings. Musculoskeletal: Unremarkable IMPRESSION: 1. Complex cystic lesion of the pancreatic head measuring 1.7 by 1.3 by 1.6 cm, roughly similar in size back through earliest available comparison of 08/16/2020. The presence of internal debris as well as the patient's history of pancreatitis strongly favor pseudocyst over intraductal papillary mucinous neoplasm. That said, given the patient's age, surveillance would be prudent. In light of the patient's 3 year history of relative stability, follow up pancreatic protocol CT or MRI would be recommended in 2 years time. This recommendation follows ACR consensus guidelines: Management of  Incidental Pancreatic Cysts: A White Paper of the ACR Incidental Findings Committee. J Am Coll Radiol 27622;63:335-456 2. Subtle peripancreatic edema although accounting for the difference in modality, this appears improved compared to 12/30/2022. No findings of pancreatic necrosis or abscess. 3. Mild cardiomegaly. Electronically Signed   By: WVan ClinesM.D.   On: 01/03/2023 12:32   MR 3D Recon At Scanner  Result Date: 01/03/2023 CLINICAL DATA:  Chronic pancreatitis and pancreatic cystic lesion. Acute pancreatitis. EXAM: MRI ABDOMEN WITHOUT AND WITH CONTRAST (INCLUDING MRCP) TECHNIQUE: Multiplanar multisequence MR imaging of the abdomen was performed both before and after the administration of intravenous contrast. Heavily T2-weighted images of the biliary and pancreatic ducts were obtained, and three-dimensional MRCP images were rendered by post processing. Patient experience nausea after the contrast injection. CONTRAST:  839mGADAVIST GADOBUTROL 1 MMOL/ML  IV SOLN COMPARISON:  Multiple exams, including CT abdomen 12/30/2022 and MRI 07/04/2022 FINDINGS: Lower chest: Mild cardiomegaly. Hepatobiliary: Unremarkable Pancreas: Subtle peripancreatic edema although accounting for the difference in modality, this appears distention improved compared to 12/30/2022. No findings of pancreatic necrosis or abscess. Complex cystic lesion of the pancreatic head measuring 1.7 by 1.3 by 1.6 cm (volume = 1.9 cm^3) as on image 52 series 17 and image 14 series 4. There is internal debris as shown on image 14 series 4, but no internal enhancement. 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 No new lesions identified. Spleen:  Unremarkable Adrenals/Urinary Tract:  Unremarkable Stomach/Bowel: Unremarkable  Vascular/Lymphatic:  Unremarkable Other:  No supplemental non-categorized findings. Musculoskeletal: Unremarkable IMPRESSION: 1. Complex cystic lesion of the pancreatic head measuring 1.7 by 1.3 by 1.6 cm, roughly similar in size back through earliest available comparison of 08/16/2020. The presence of internal debris as well as the patient's history of pancreatitis strongly favor pseudocyst over intraductal papillary mucinous neoplasm. That said, given the patient's age, surveillance would be prudent. In light of the patient's 3 year history of relative stability, follow up pancreatic protocol CT or MRI would be recommended in 2 years time. This recommendation follows ACR consensus guidelines: Management of Incidental Pancreatic Cysts: A White Paper of the ACR Incidental Findings Committee. J Am Coll Radiol 2122;48:250-037. 2. Subtle peripancreatic edema although accounting for the difference in modality, this appears improved compared to 12/30/2022. No findings of pancreatic necrosis or abscess. 3. Mild cardiomegaly. Electronically Signed   By: Van Clines M.D.   On: 01/03/2023 12:32    IMPRESSION: Recurrent pancreatitis Pancreatic head cystic lesion  -MRCP 01/03/2023 showed complex cystic lesion in the pancreatic head 1.7 x 1.3 x 1.6, suggestive of pseudocyst EtOH use, previous, abstinent x 8 months Tobacco use, previous, abstinent x 2 years Personal history colon polyp Normocytic anemia  PLAN: -Patient's pain appears to be clinically improved, he is able to tolerate some oral intake -Etiology of acute pancreatitis undetermined, IgG4 pending -Recommend outpatient endoscopic ultrasound to further evaluate pancreatic head cyst, patient in agreement, will coordinate outpatient follow-up with Dr. Arta Silence with [facility] to discuss further -Low-fat diet as tolerated -Patient appears appropriate for discharge, discussed with internal medicine   LOS: 4 days   Danton Clap,  [facility]astroenterology

## 2023-01-04 DIAGNOSIS — K859 Acute pancreatitis without necrosis or infection, unspecified: Secondary | ICD-10-CM | POA: Diagnosis not present

## 2023-01-04 DIAGNOSIS — K861 Other chronic pancreatitis: Secondary | ICD-10-CM | POA: Diagnosis not present

## 2023-01-04 MED ORDER — AMLODIPINE BESYLATE 5 MG PO TABS: 5.0000 mg | ORAL_TABLET | Freq: Every day | ORAL | 0 refills | Status: DC

## 2023-01-04 MED ORDER — OXYCODONE-ACETAMINOPHEN 5-325 MG PO TABS: 1.0000 | ORAL_TABLET | Freq: Four times a day (QID) | ORAL | 0 refills | Status: DC | PRN

## 2023-01-04 MED ORDER — PANTOPRAZOLE SODIUM 40 MG PO TBEC: 40.0000 mg | DELAYED_RELEASE_TABLET | Freq: Every day | ORAL | 2 refills | Status: DC

## 2023-01-04 MED ORDER — POLYETHYLENE GLYCOL 3350 17 G PO PACK: 17.0000 g | PACK | Freq: Every day | ORAL | 0 refills | Status: DC

## 2023-01-04 MED ORDER — ONDANSETRON HCL 4 MG PO TABS: 4.0000 mg | ORAL_TABLET | Freq: Three times a day (TID) | ORAL | 0 refills | Status: DC | PRN

## 2023-01-04 NOTE — Progress Notes (Signed)
.   Transition of Care [facility]) Screening Note   Patient Details  Name: Max Ayers Date of Birth: 04-13-1971   Transition of Care [facility]) CM/SW Contact:    Illene Regulus, LCSW Phone Number: 01/04/2023, 2:28 PM    Transition of Care Department [facility]) has reviewed patient and no TOC needs have been identified at this time. We will continue to monitor patient advancement through interdisciplinary progression rounds. If new patient transition needs arise, please place a TOC consult.

## 2023-01-04 NOTE — Plan of Care (Signed)
  Problem: Education: Goal: Knowledge of General Education information will improve Description: Including pain rating scale, medication(s)/side effects and non-pharmacologic comfort measures 01/04/2023 1407 by Jerene Pitch, RN Outcome: Adequate for Discharge 01/04/2023 1359 by Jerene Pitch, RN Outcome: Progressing   Problem: Health Behavior/Discharge Planning: Goal: Ability to manage health-related needs will improve 01/04/2023 1407 by Jerene Pitch, RN Outcome: Adequate for Discharge 01/04/2023 1359 by Jerene Pitch, RN Outcome: Progressing   Problem: Clinical Measurements: Goal: Ability to maintain clinical measurements within normal limits will improve Outcome: Adequate for Discharge Goal: Will remain free from infection Outcome: Adequate for Discharge Goal: Diagnostic test results will improve Outcome: Adequate for Discharge Goal: Respiratory complications will improve Outcome: Adequate for Discharge Goal: Cardiovascular complication will be avoided Outcome: Adequate for Discharge   Problem: Activity: Goal: Risk for activity intolerance will decrease Outcome: Adequate for Discharge   Problem: Nutrition: Goal: Adequate nutrition will be maintained Outcome: Adequate for Discharge   Problem: Coping: Goal: Level of anxiety will decrease Outcome: Adequate for Discharge   Problem: Elimination: Goal: Will not experience complications related to bowel motility Outcome: Adequate for Discharge Goal: Will not experience complications related to urinary retention Outcome: Adequate for Discharge   Problem: Pain Managment: Goal: General experience of comfort will improve Outcome: Adequate for Discharge   Problem: Safety: Goal: Ability to remain free from injury will improve Outcome: Adequate for Discharge   Problem: Skin Integrity: Goal: Risk for impaired skin integrity will decrease Outcome: Adequate for Discharge   Problem: Food- and Nutrition-Related  Knowledge Deficit (NB-1.1) Goal: Nutrition education Description: Formal process to instruct or train a patient/client in a skill or to impart knowledge to help patients/clients voluntarily manage or modify food choices and eating behavior to maintain or improve health. Outcome: Adequate for Discharge

## 2023-01-04 NOTE — Plan of Care (Signed)
  Problem: Education: Goal: Knowledge of General Education information will improve Description: Including pain rating scale, medication(s)/side effects and non-pharmacologic comfort measures Outcome: Progressing   Problem: Health Behavior/Discharge Planning: Goal: Ability to manage health-related needs will improve Outcome: Progressing

## 2023-01-04 NOTE — Plan of Care (Signed)
Nutrition Education Note  RD consulted for nutrition education regarding a Low Fat diet for pancreatitis.   Reviewed patient's dietary recall.  Breakfast: coffee Lunch: fast food Dinner: fast food (often McDonald's or Brendolyn Patty)  RD provided "Pancreatitis Nutrition Therapy" handout from the Academy of Nutrition and Dietetics.  Provided examples on ways to decrease fat intake in diet. Discussed importance of protein in the diet. Discouraged intake of processed foods and fast food. Reviewed foods recommended and those not recommended in each food group. Encouraged fresh fruits and vegetables as well as lean protein. Teach back method used.  Expect good compliance.  Body mass index is 27.95 kg/m.   Current diet order is Heart Healthy, patient is consuming approximately 75-100% of meals at this time. Labs and medications reviewed. No further nutrition interventions warranted at this time. RD contact information provided. If additional nutrition issues arise, please re-consult RD.  Samson Frederic RD, LDN For contact information, refer to [facility].

## 2023-01-04 NOTE — Progress Notes (Signed)
Patient d/c paperwork explained to patient. All questions answered regarding d/c medications, MD appointment, and d/c transportation. PIV removed.

## 2023-01-04 NOTE — Discharge Instructions (Signed)
Pancreatitis Nutrition Therapy  The pancreas helps your body digest and absorb nutrients in food. Pancreatitis prevents the body from digesting food well, especially if the food is high in fat.  This nutrition therapy limits the fat in your diet while providing nutrients you need. Your goal is to eat as near to a normal diet as possible without experiencing gastrointestinal (GI) symptoms. These symptoms include stomach pain, bloating, weight loss or difficulty maintaining weight, vomiting, burping, loose stools, and steatorrhea. The effects of pancreatitis are different for all individuals, so it is important to work with your registered dietitian nutritionist (RDN) to determine which foods trigger your GI symptoms. Your RDN can also help you figure out your tolerance to fat in foods and how to manage your symptoms with your diet.  Tips You may need to take pancreatic enzymes if you have frequent, loose stools after mealtimes. Individuals who take pancreatic enzymes will need to take the prescribed dosage at the start of a meal or snack. Individuals who are prescribed a low-fat diet will need to limit fats and oils to no more than 6 teaspoons (30 grams) daily. Up to 1 ounce of avocado can also be substituted for 1 teaspoon of fat. A low-fat diet may not be needed if you are taking pancreatic enzymes. Avoid drinking alcohol. Keep a bottle of water with you at all times to stay hydrated and ensure you are getting in enough fluid each day. The general recommendation is to drink 8 cups (64 ounces) of fluids daily. Eat small, frequent meals (4-6) throughout the day to help you recover from pancreatitis or to maintain your normal body weight. Eat more whole fruits and vegetables rather than drinking fruit and vegetable juices.  Fiber is found in whole grain foods and slows digestion. You may need to choose whole grain foods less often if you feel full quickly after eating. Ask your RDN for recommendations on  managing your diet if you also have other conditions, such as diabetes mellitus. Your RDN might recommend a vitamin and mineral supplement or fat-soluble vitamin supplements if you require higher amounts of these nutrients.  Foods Recommended and Foods Not Recommended The following list of foods may be helpful if you need to limit your fat intake. Food Group Foods Recommended Foods Not Recommended  Grains Breads: Bagels, buns, English muffins Hot/cold cereals Couscous Low-fat crackers Pancakes Pasta Popcorn, air popped Rice Corn or flour tortilla Products made with added fat (such as biscuits, waffles, and regular crackers) High-fat bakery products such as doughnuts, biscuits, croissants, Danish pastries, pies, cookies Snacks made with partially hydrogenated oils including chips, cheese puffs, snack mixes, regular crackers, butter-flavored popcorn  Protein Foods Lean cuts of poultry (without skin) such as chicken or turkey Low-fat hamburger (for example, 7% fat) Lean cuts of fish (white fish) Canned tuna in water Egg whites or egg substitute Lean deli meats such as turkey, chicken, lean beef Non-animal protein sources (tofu, legumes, beans, lentils) Smooth nut butters     Higher-fat cuts of meats such as ribs, T-bone steak, regular hamburger (15% to 20% fat) Full-fat processed meats (hot dogs, bologna, salami, sausage, bacon, etc) Red meats Organ meats (liver, brains, sweetbreads) Poultry with skin Fried meat, poultry, tofu, and fish Whole eggs and egg yolks Full-fat refried beans Tree nuts and peanuts  Dairy and Dairy Alternatives 1% or fat-free dairy (milk, yogurt, cheese, cottage cheese, sour cream) Frozen yogurt Fortified non-dairy milk (almond, rice, soy, etc.) Creamy/cheesy sauces Cream Whole-fat or reduced-fat (2%) dairy (  milk, yogurt, ice cream, cheese) Milkshakes Half-and-half Cream cheese Sour cream Coconut milk  Vegetables All fresh, frozen, or canned  vegetables Fried or stir-fried vegetables Vegetables prepared with butter, cheese, or cream sauce  Fruit All fresh, frozen, or canned fruit Fried fruits Fruit served with butter or cream  Fats and Oils All vegetable oils   Butter, stick margarine, shortening, partially hydrogenated oils, tropical oils (coconut, palm, and palm kernel oils)

## 2023-01-05 NOTE — Discharge Summary (Signed)
Physician Discharge Summary   Patient: Max Ayers MRN: [id number] DOB: 1971/01/04  Admit date:     12/30/2022  Discharge date: 01/04/2023  Discharge Physician: Berle Mull  PCP: Willeen Niece, PA  Recommendations at discharge: Follow-up with PCP in 1 week.  Follow-up with GI as recommended.   Follow-up Information     [place], [place]. Schedule an appointment as soon as possible for a visit in 1 week(s).   Specialty: Physician Assistant Contact information: [address] [place] [address] [phone]         Gastroenterology, [facility]. Schedule an appointment as soon as possible for a visit in 1 month(s).   Contact information: [place] [address] [phone]                Discharge Diagnoses: Principal Problem:   Acute on chronic pancreatitis [facility]) Active Problems:   Benign essential HTN   History of alcohol abuse   Pancreatic pseudocyst  Hospital Course: PMH of HTN, recurrent pancreatitis, remote history of alcohol and tobacco abuse presented to hospital with complaints of abdominal pain.  Found to have acute pancreatitis.  GI consulted due to ongoing pain. Assessment and Plan  Acute on chronic pancreatitis. Lipase level was 3000 at the time of admission. Treated with IV fluid IV pain medication. Tolerating heart healthy diet. Lipase level improving. IgG4 negative. Appreciate [facility] consultation.  Outpatient EUS will be scheduled. Switching patient from IV Dilaudid which he is using extensively to pills only for pain control.   Constipation. Treating aggressively with medications for bowel regimen.   History of alcohol abuse. Patient claims he is abstained from alcohol for almost 2 years now. Monitor.   HTN. Blood pressure stable on current regimen. Monitor   Pain control - [employer] Controlled Substance Reporting System database was reviewed. and patient was instructed, not to  drive, operate heavy machinery, perform activities at heights, swimming or participation in water activities or provide baby-sitting services while on Pain, Sleep and Anxiety Medications; until their outpatient Physician has advised to do so again. Also recommended to not to take more than prescribed Pain, Sleep and Anxiety Medications.  Consultants:  [facility]  Procedures performed:  none  DISCHARGE MEDICATION: Allergies as of 01/04/2023       Reactions   Yellow Jacket Venom [bee Venom] Anaphylaxis, Hives   Gadolinium Derivatives Nausea Only   2nd time in a row. Pt received MRI contrast and immediately experienced nausea.         Medication List     STOP taking these medications    oxyCODONE 5 MG immediate release tablet Commonly known as: Roxicodone       TAKE these medications    amLODipine 5 MG tablet Commonly known as: NORVASC Take 1 tablet (5 mg total) by mouth daily.   Clear Eyes Complete Soln Place 2 drops into both eyes as needed (for irritation).   hydrALAZINE 100 MG tablet Commonly known as: APRESOLINE Take 1 tablet (100 mg total) by mouth every 8 (eight) hours. What changed: when to take this   labetalol 100 MG tablet Commonly known as: NORMODYNE Take 1 tablet (100 mg total) by mouth 2 (two) times daily.   ondansetron 4 MG tablet Commonly known as: Zofran Take 1 tablet (4 mg total) by mouth every 8 (eight) hours as needed for nausea or vomiting.   oxyCODONE-acetaminophen 5-325 MG tablet Commonly known as: PERCOCET/ROXICET Take 1 tablet by mouth every 6 (  six) hours as needed for severe pain or moderate pain. What changed:  how much to take when to take this reasons to take this   pantoprazole 40 MG tablet Commonly known as: PROTONIX Take 1 tablet (40 mg total) by mouth at bedtime. What changed:  when to take this reasons to take this   polyethylene glycol 17 g packet Commonly known as: MIRALAX / GLYCOLAX Take 17 g by mouth daily.    TYLENOL 500 MG tablet Generic drug: acetaminophen Take 1,000 mg by mouth as needed for headache or mild pain.       Disposition: Home Diet recommendation: Cardiac diet  Discharge Exam: Vitals:   01/03/23 1217 01/03/23 1954 01/04/23 0428 01/04/23 0946  BP: (!) 157/83 127/71 135/74 130/86  Pulse: 76 (!) 56 60 63  Resp: '16 16 16   '$ Temp: 98.4 F (36.9 C) 98.1 F (36.7 C) (!) 97.5 F (36.4 C)   TempSrc: Oral Oral Oral   SpO2: 97% 95% 98%   Weight:      Height:       General: Appear in mild distress; no visible Abnormal Neck Mass Or lumps, Conjunctiva normal Cardiovascular: S1 and S2 Present, no Murmur, Respiratory: good respiratory effort, Bilateral Air entry present and CTA, no Crackles, no wheezes Abdomen: Bowel Sound present, diffuse tenderness mild Extremities: no Pedal edema Neurology: alert and oriented to time, place, and person  Filed Weights   01/01/23 0451 01/02/23 0500 01/03/23 0500  Weight: 90.8 kg 93 kg 90.9 kg   Condition at discharge: stable  The results of significant diagnostics from this hospitalization (including imaging, microbiology, ancillary and laboratory) are listed below for reference.   Imaging Studies: MR ABDOMEN MRCP W WO CONTAST  Result Date: 01/03/2023 CLINICAL DATA:  Chronic pancreatitis and pancreatic cystic lesion. Acute pancreatitis. EXAM: MRI ABDOMEN WITHOUT AND WITH CONTRAST (INCLUDING MRCP) TECHNIQUE: Multiplanar multisequence MR imaging of the abdomen was performed both before and after the administration of intravenous contrast. Heavily T2-weighted images of the biliary and pancreatic ducts were obtained, and three-dimensional MRCP images were rendered by post processing. Patient experience nausea after the contrast injection. CONTRAST:  26m GADAVIST GADOBUTROL 1 MMOL/ML IV SOLN COMPARISON:  Multiple exams, including CT abdomen 12/30/2022 and MRI 07/04/2022 FINDINGS: Lower chest: Mild cardiomegaly. Hepatobiliary: Unremarkable Pancreas:  Subtle peripancreatic edema although accounting for the difference in modality, this appears distention improved compared to 12/30/2022. No findings of pancreatic necrosis or abscess. Complex cystic lesion of the pancreatic head measuring 1.7 by 1.3 by 1.6 cm (volume = 1.9 cm^3) as on image 52 series 17 and image 14 series 4. There is internal debris as shown on image 14 series 4, but no internal enhancement. 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 No new lesions identified. Spleen:  Unremarkable Adrenals/Urinary Tract:  Unremarkable Stomach/Bowel: Unremarkable Vascular/Lymphatic:  Unremarkable Other:  No supplemental non-categorized findings. Musculoskeletal: Unremarkable IMPRESSION: 1. Complex cystic lesion of the pancreatic head measuring 1.7 by 1.3 by 1.6 cm, roughly similar in size back through earliest available comparison of 08/16/2020. The presence of internal debris as well as the patient's history of pancreatitis strongly favor pseudocyst over intraductal papillary mucinous neoplasm. That said, given the patient's age, surveillance would be prudent. In light of the patient's 3 year history of relative stability, follow up pancreatic protocol CT or  MRI would be recommended in 2 years time. This recommendation follows ACR consensus guidelines: Management of Incidental Pancreatic Cysts: A White Paper of the ACR Incidental Findings Committee. J Am Coll Radiol 4259;56:387-564. 2. Subtle peripancreatic edema although accounting for the difference in modality, this appears improved compared to 12/30/2022. No findings of pancreatic necrosis or abscess. 3. Mild cardiomegaly. Electronically Signed   By: Van Clines M.D.   On: 01/03/2023 12:32   MR 3D Recon At Scanner  Result Date:  01/03/2023 CLINICAL DATA:  Chronic pancreatitis and pancreatic cystic lesion. Acute pancreatitis. EXAM: MRI ABDOMEN WITHOUT AND WITH CONTRAST (INCLUDING MRCP) TECHNIQUE: Multiplanar multisequence MR imaging of the abdomen was performed both before and after the administration of intravenous contrast. Heavily T2-weighted images of the biliary and pancreatic ducts were obtained, and three-dimensional MRCP images were rendered by post processing. Patient experience nausea after the contrast injection. CONTRAST:  43m GADAVIST GADOBUTROL 1 MMOL/ML IV SOLN COMPARISON:  Multiple exams, including CT abdomen 12/30/2022 and MRI 07/04/2022 FINDINGS: Lower chest: Mild cardiomegaly. Hepatobiliary: Unremarkable Pancreas: Subtle peripancreatic edema although accounting for the difference in modality, this appears distention improved compared to 12/30/2022. No findings of pancreatic necrosis or abscess. Complex cystic lesion of the pancreatic head measuring 1.7 by 1.3 by 1.6 cm (volume = 1.9 cm^3) as on image 52 series 17 and image 14 series 4. There is internal debris as shown on image 14 series 4, but no internal enhancement. 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 No new lesions identified. Spleen:  Unremarkable Adrenals/Urinary Tract:  Unremarkable Stomach/Bowel: Unremarkable Vascular/Lymphatic:  Unremarkable Other:  No supplemental non-categorized findings. Musculoskeletal: Unremarkable IMPRESSION: 1. Complex cystic lesion of the pancreatic head measuring 1.7 by 1.3 by 1.6 cm, roughly similar in size back through earliest available comparison of 08/16/2020. The presence of internal debris as well as the patient's history of pancreatitis strongly favor pseudocyst over intraductal papillary mucinous neoplasm. That  said, given the patient's age, surveillance would be prudent. In light of the patient's 3 year history of relative stability, follow up pancreatic protocol CT or MRI would be recommended in 2 years time. This recommendation follows ACR consensus guidelines: Management of Incidental Pancreatic Cysts: A White Paper of the ACR Incidental Findings Committee. J Am Coll Radiol 23329;51:884-166 2. Subtle peripancreatic edema although accounting for the difference in modality, this appears improved compared to 12/30/2022. No findings of pancreatic necrosis or abscess. 3. Mild cardiomegaly. Electronically Signed   By: WVan ClinesM.D.   On: 01/03/2023 12:32   CT ABDOMEN PELVIS W CONTRAST  Result Date: 12/30/2022 CLINICAL DATA:  Abdominal pain, acute, nonlocalized. History of pancreatitis. Nausea. EXAM: CT ABDOMEN AND PELVIS WITH CONTRAST TECHNIQUE: Multidetector CT imaging of the abdomen and pelvis was performed using the standard protocol following bolus administration of intravenous contrast. RADIATION DOSE REDUCTION: This exam was performed according to the departmental dose-optimization program which includes automated exposure control, adjustment of the mA and/or kV according to patient size and/or use of iterative reconstruction technique. CONTRAST:  1037mOMNIPAQUE IOHEXOL 300 MG/ML  SOLN COMPARISON:  CT abdomen/pelvis 11/26/2022. FINDINGS: Lower chest: No acute abnormality. Hepatobiliary: No focal liver abnormality is seen. No gallstones, gallbladder wall thickening, or biliary dilatation. Pancreas: Extensive peripancreatic inflammation along the head, body and proximal tail. No discrete fluid collection. Unchanged 1.7 by 1.1 cm  cystic lesion in the pancreatic head. Spleen: Normal. Adrenals/Urinary Tract: Adrenal glands are unremarkable. Kidneys are normal, without renal calculi, focal lesion, or hydronephrosis. Bladder is unremarkable. Stomach/Bowel: Normal distal esophagus. Secondary inflammation of the  distal stomach and proximal duodenum with associated mild distention of the gastric fundus. Small bowel loops are nondilated. Normal appendix is visualized on image 45 series 2. Colonic diverticulosis without surrounding inflammation. Redundant sigmoid colon. Normal rectum. Vascular/Lymphatic: Aortic atherosclerosis. No enlarged abdominal or pelvic lymph nodes. Reproductive: Prostate is unremarkable. Other: No abdominal wall hernia or abnormality. No abdominopelvic ascites. Musculoskeletal: No suspicious bone lesions. IMPRESSION: 1. Acute, uncomplicated pancreatitis. 2. Unchanged 1.7 x 1.1 cm cystic lesion in the pancreatic head. 3. Colonic diverticulosis without evidence of acute diverticulitis. Electronically Signed   By: Emmit Alexanders M.D.   On: 12/30/2022 16:18    Microbiology: Results for orders placed or performed during the hospital encounter of 12/13/21  Resp Panel by RT-PCR (Flu A&B, Covid) Nasopharyngeal Swab     Status: None   Collection Time: 12/13/21 10:54 PM   Specimen: Nasopharyngeal Swab; Nasopharyngeal(NP) swabs in vial transport medium  Result Value Ref Range Status   SARS Coronavirus 2 by RT PCR NEGATIVE NEGATIVE Final    Comment: (NOTE) SARS-CoV-2 target nucleic acids are NOT DETECTED.  The SARS-CoV-2 RNA is generally detectable in upper respiratory specimens during the acute phase of infection. The lowest concentration of SARS-CoV-2 viral copies this assay can detect is 138 copies/mL. A negative result does not preclude SARS-Cov-2 infection and should not be used as the sole basis for treatment or other patient management decisions. A negative result may occur with  improper specimen collection/handling, submission of specimen other than nasopharyngeal swab, presence of viral mutation(s) within the areas targeted by this assay, and inadequate number of viral copies(<138 copies/mL). A negative result must be combined with clinical observations, patient history, and  epidemiological information. The expected result is Negative.  Fact Sheet for Patients:  EntrepreneurPulse.com.au  Fact Sheet for Healthcare Providers:  IncredibleEmployment.be  This test is no t yet approved or cleared by the [place] FDA and  has been authorized for detection and/or diagnosis of SARS-CoV-2 by FDA under an Emergency Use Authorization (EUA). This EUA will remain  in effect (meaning this test can be used) for the duration of the COVID-19 declaration under Section 564(b)(1) of the Act, 21 U.S.C.section 360bbb-3(b)(1), unless the authorization is terminated  or revoked sooner.       Influenza A by PCR NEGATIVE NEGATIVE Final   Influenza B by PCR NEGATIVE NEGATIVE Final    Comment: (NOTE) The Xpert Xpress SARS-CoV-2/FLU/RSV plus assay is intended as an aid in the diagnosis of influenza from Nasopharyngeal swab specimens and should not be used as a sole basis for treatment. Nasal washings and aspirates are unacceptable for Xpert Xpress SARS-CoV-2/FLU/RSV testing.  Fact Sheet for Patients: EntrepreneurPulse.com.au  Fact Sheet for Healthcare Providers: IncredibleEmployment.be  This test is not yet approved or cleared by the [place] FDA and has been authorized for detection and/or diagnosis of SARS-CoV-2 by FDA under an Emergency Use Authorization (EUA). This EUA will remain in effect (meaning this test can be used) for the duration of the COVID-19 declaration under Section 564(b)(1) of the Act, 21 U.S.C. section 360bbb-3(b)(1), unless the authorization is terminated or revoked.  Performed at [facility], [address]elaine Bhat [place], [place] [address]    Labs: CBC: Recent Labs  Lab 12/30/22 1438 12/31/22 0506 01/01/23 0536 01/02/23 0428 01/03/23  0417  WBC 5.7 5.4 4.9 4.8 4.6  NEUTROABS  --  3.5  --   --   --   HGB 14.1 13.0 12.7* 12.2* 12.4*  HCT 41.7 39.5 38.0*  36.5* 38.0*  MCV 86.5 88.0 87.2 87.1 87.4  PLT 228 203 186 186 381   Basic Metabolic Panel: Recent Labs  Lab 12/30/22 1438 12/31/22 0506 01/01/23 0536 01/02/23 0428 01/03/23 0417  NA 138 140 140 138 136  K 3.7 4.0 3.3* 3.5 3.5  CL 105 105 106 105 103  CO2 '23 23 28 25 26  '$ GLUCOSE 134* 117* 93 94 113*  BUN '13 11 6 '$ <5* 7  CREATININE 0.99 0.87 0.78 0.85 0.92  CALCIUM 9.1 9.0 8.5* 8.7* 8.7*  MG  --   --  1.9 1.9 2.0   Liver Function Tests: Recent Labs  Lab 12/30/22 1438 12/31/22 0506 01/01/23 0536 01/02/23 0428 01/03/23 0417  AST '22 18 16 17 23  '$ ALT '20 17 13 14 20  '$ ALKPHOS 65 53 58 58 61  BILITOT 0.5 0.6 0.5 0.4 0.3  PROT 7.6 6.4* 6.3* 6.5 6.8  ALBUMIN 4.0 3.5 3.3* 3.5 3.5   CBG: No results for input(s): "GLUCAP" in the last 168 hours.  Discharge time spent: greater than 30 minutes.  Signed: Berle Mull, MD [facility] 01/04/2023

## 2023-02-12 ENCOUNTER — Emergency Department (HOSPITAL_BASED_OUTPATIENT_CLINIC_OR_DEPARTMENT_OTHER): Payer: Medicaid Other

## 2023-02-12 ENCOUNTER — Other Ambulatory Visit: Payer: Self-pay

## 2023-02-12 ENCOUNTER — Encounter (HOSPITAL_BASED_OUTPATIENT_CLINIC_OR_DEPARTMENT_OTHER): Payer: Self-pay

## 2023-02-12 ENCOUNTER — Inpatient Hospital Stay (HOSPITAL_BASED_OUTPATIENT_CLINIC_OR_DEPARTMENT_OTHER)
Admission: EM | Admit: 2023-02-12 | Discharge: 2023-02-16 | DRG: 439 | Disposition: A | Discharge status: Home / Self Care | Payer: Medicaid Other | Attending: Internal Medicine | Admitting: Internal Medicine

## 2023-02-12 DIAGNOSIS — K859 Acute pancreatitis without necrosis or infection, unspecified: Principal | ICD-10-CM | POA: Diagnosis present

## 2023-02-12 DIAGNOSIS — Z833 Family history of diabetes mellitus: Secondary | ICD-10-CM

## 2023-02-12 DIAGNOSIS — Z888 Allergy status to other drugs, medicaments and biological substances status: Secondary | ICD-10-CM

## 2023-02-12 DIAGNOSIS — F1091 Alcohol use, unspecified, in remission: Secondary | ICD-10-CM | POA: Diagnosis present

## 2023-02-12 DIAGNOSIS — Z79899 Other long term (current) drug therapy: Secondary | ICD-10-CM

## 2023-02-12 DIAGNOSIS — Z91030 Bee allergy status: Secondary | ICD-10-CM

## 2023-02-12 DIAGNOSIS — K861 Other chronic pancreatitis: Secondary | ICD-10-CM | POA: Diagnosis present

## 2023-02-12 DIAGNOSIS — H0520 Unspecified exophthalmos: Secondary | ICD-10-CM | POA: Diagnosis present

## 2023-02-12 DIAGNOSIS — R001 Bradycardia, unspecified: Secondary | ICD-10-CM | POA: Diagnosis present

## 2023-02-12 DIAGNOSIS — E876 Hypokalemia: Secondary | ICD-10-CM | POA: Diagnosis present

## 2023-02-12 DIAGNOSIS — Z803 Family history of malignant neoplasm of breast: Secondary | ICD-10-CM

## 2023-02-12 DIAGNOSIS — E785 Hyperlipidemia, unspecified: Secondary | ICD-10-CM | POA: Diagnosis present

## 2023-02-12 DIAGNOSIS — K863 Pseudocyst of pancreas: Secondary | ICD-10-CM | POA: Diagnosis present

## 2023-02-12 DIAGNOSIS — Z91038 Other insect allergy status: Secondary | ICD-10-CM | POA: Diagnosis not present

## 2023-02-12 DIAGNOSIS — I10 Essential (primary) hypertension: Secondary | ICD-10-CM | POA: Diagnosis present

## 2023-02-12 DIAGNOSIS — Z8249 Family history of ischemic heart disease and other diseases of the circulatory system: Secondary | ICD-10-CM | POA: Diagnosis not present

## 2023-02-12 DIAGNOSIS — R52 Pain, unspecified: Secondary | ICD-10-CM

## 2023-02-12 DIAGNOSIS — Z87891 Personal history of nicotine dependence: Secondary | ICD-10-CM

## 2023-02-12 LAB — CBC WITH DIFFERENTIAL/PLATELET
Abs Immature Granulocytes: 0 10*3/uL (ref 0.00–0.07)
Basophils Absolute: 0 10*3/uL (ref 0.0–0.1)
Basophils Relative: 0 %
Eosinophils Absolute: 0.1 10*3/uL (ref 0.0–0.5)
Eosinophils Relative: 1 %
HCT: 41 % (ref 39.0–52.0)
Hemoglobin: 13.8 g/dL (ref 13.0–17.0)
Immature Granulocytes: 0 %
Lymphocytes Relative: 32 %
Lymphs Abs: 1.7 10*3/uL (ref 0.7–4.0)
MCH: 29.2 pg (ref 26.0–34.0)
MCHC: 33.7 g/dL (ref 30.0–36.0)
MCV: 86.7 fL (ref 80.0–100.0)
Monocytes Absolute: 0.3 10*3/uL (ref 0.1–1.0)
Monocytes Relative: 6 %
Neutro Abs: 3.2 10*3/uL (ref 1.7–7.7)
Neutrophils Relative %: 61 %
Platelets: 219 10*3/uL (ref 150–400)
RBC: 4.73 MIL/uL (ref 4.22–5.81)
RDW: 13 % (ref 11.5–15.5)
WBC: 5.2 10*3/uL (ref 4.0–10.5)
nRBC: 0 % (ref 0.0–0.2)

## 2023-02-12 LAB — COMPREHENSIVE METABOLIC PANEL
ALT: 14 U/L (ref 0–44)
AST: 20 U/L (ref 15–41)
Albumin: 3.7 g/dL (ref 3.5–5.0)
Alkaline Phosphatase: 62 U/L (ref 38–126)
Anion gap: 5 (ref 5–15)
BUN: 16 mg/dL (ref 6–20)
CO2: 23 mmol/L (ref 22–32)
Calcium: 8.6 mg/dL — ABNORMAL LOW (ref 8.9–10.3)
Chloride: 107 mmol/L (ref 98–111)
Creatinine, Ser: 1.03 mg/dL (ref 0.61–1.24)
GFR, Estimated: >60 mL/min (ref >60)
Glucose, Bld: 154 mg/dL — ABNORMAL HIGH (ref 70–99)
Potassium: 3.7 mmol/L (ref 3.5–5.1)
Sodium: 135 mmol/L (ref 135–145)
Total Bilirubin: 0.7 mg/dL (ref 0.3–1.2)
Total Protein: 7.2 g/dL (ref 6.5–8.1)

## 2023-02-12 LAB — URINALYSIS, ROUTINE W REFLEX MICROSCOPIC
Bilirubin Urine: NEGATIVE
Glucose, UA: NEGATIVE mg/dL
Hgb urine dipstick: NEGATIVE
Ketones, ur: NEGATIVE mg/dL
Leukocytes,Ua: NEGATIVE
Nitrite: NEGATIVE
Protein, ur: NEGATIVE mg/dL
Specific Gravity, Urine: 1.02 (ref 1.005–1.030)
pH: 7 (ref 5.0–8.0)

## 2023-02-12 LAB — TROPONIN I (HIGH SENSITIVITY): Troponin I (High Sensitivity): 4 ng/L (ref <18)

## 2023-02-12 LAB — LIPASE, BLOOD: Lipase: 610 U/L — ABNORMAL HIGH (ref 11–51)

## 2023-02-12 MED ORDER — MORPHINE SULFATE (PF) 4 MG/ML IV SOLN
4.0000 mg | Freq: Once | INTRAVENOUS | Status: AC
  Administered 2023-02-12: 4 mg via INTRAVENOUS
  Filled 2023-02-12: qty 1

## 2023-02-12 MED ORDER — ONDANSETRON HCL 4 MG PO TABS
4.0000 mg | ORAL_TABLET | Freq: Four times a day (QID) | ORAL | Status: DC | PRN
  Administered 2023-02-13 – 2023-02-14 (×2): 4 mg via ORAL
  Filled 2023-02-12 (×3): qty 1

## 2023-02-12 MED ORDER — SODIUM CHLORIDE 0.9 % IV BOLUS
1000.0000 mL | Freq: Once | INTRAVENOUS | Status: AC
  Administered 2023-02-12: 1000 mL via INTRAVENOUS

## 2023-02-12 MED ORDER — LACTATED RINGERS IV SOLN: INTRAVENOUS | Status: AC

## 2023-02-12 MED ORDER — SODIUM CHLORIDE 0.9 % IV SOLN: INTRAVENOUS | Status: AC

## 2023-02-12 MED ORDER — ONDANSETRON HCL 4 MG/2ML IJ SOLN
4.0000 mg | Freq: Once | INTRAMUSCULAR | Status: AC
  Administered 2023-02-12: 4 mg via INTRAVENOUS
  Filled 2023-02-12: qty 2

## 2023-02-12 MED ORDER — AMLODIPINE BESYLATE 5 MG PO TABS
5.0000 mg | ORAL_TABLET | Freq: Every day | ORAL | Status: DC
  Administered 2023-02-13 – 2023-02-16 (×4): 5 mg via ORAL
  Filled 2023-02-12 (×4): qty 1

## 2023-02-12 MED ORDER — ACETAMINOPHEN 325 MG PO TABS
650.0000 mg | ORAL_TABLET | Freq: Four times a day (QID) | ORAL | Status: DC | PRN
  Administered 2023-02-13: 650 mg via ORAL
  Filled 2023-02-12: qty 2

## 2023-02-12 MED ORDER — ENOXAPARIN SODIUM 40 MG/0.4ML IJ SOSY
40.0000 mg | PREFILLED_SYRINGE | INTRAMUSCULAR | Status: DC
  Administered 2023-02-12 – 2023-02-15 (×4): 40 mg via SUBCUTANEOUS
  Filled 2023-02-12 (×4): qty 0.4

## 2023-02-12 MED ORDER — IOHEXOL 300 MG/ML  SOLN
100.0000 mL | Freq: Once | INTRAMUSCULAR | Status: AC | PRN
  Administered 2023-02-12: 100 mL via INTRAVENOUS

## 2023-02-12 MED ORDER — ONDANSETRON HCL 4 MG/2ML IJ SOLN
4.0000 mg | Freq: Four times a day (QID) | INTRAMUSCULAR | Status: DC | PRN
  Administered 2023-02-12 – 2023-02-14 (×3): 4 mg via INTRAVENOUS
  Filled 2023-02-12 (×3): qty 2

## 2023-02-12 MED ORDER — ACETAMINOPHEN 650 MG RE SUPP: 650.0000 mg | Freq: Four times a day (QID) | RECTAL | Status: DC | PRN

## 2023-02-12 MED ORDER — HYDROCODONE-ACETAMINOPHEN 5-325 MG PO TABS
1.0000 | ORAL_TABLET | ORAL | Status: DC | PRN
  Administered 2023-02-13: 1 via ORAL
  Administered 2023-02-15: 2 via ORAL
  Filled 2023-02-12: qty 2
  Filled 2023-02-12: qty 1
  Filled 2023-02-12: qty 2

## 2023-02-12 MED ORDER — HYDRALAZINE HCL 50 MG PO TABS
100.0000 mg | ORAL_TABLET | Freq: Three times a day (TID) | ORAL | Status: DC
  Administered 2023-02-12 – 2023-02-16 (×11): 100 mg via ORAL
  Filled 2023-02-12 (×11): qty 2

## 2023-02-12 MED ORDER — HYDROMORPHONE HCL 1 MG/ML IJ SOLN
0.5000 mg | Freq: Once | INTRAMUSCULAR | Status: AC
  Administered 2023-02-12: 0.5 mg via INTRAVENOUS
  Filled 2023-02-12: qty 1

## 2023-02-12 MED ORDER — HYDROMORPHONE HCL 1 MG/ML IJ SOLN
1.0000 mg | INTRAMUSCULAR | Status: DC | PRN
  Administered 2023-02-12 – 2023-02-13 (×2): 1 mg via INTRAVENOUS
  Filled 2023-02-12 (×2): qty 1

## 2023-02-12 MED ORDER — FENTANYL CITRATE PF 50 MCG/ML IJ SOSY
50.0000 ug | PREFILLED_SYRINGE | Freq: Once | INTRAMUSCULAR | Status: AC
  Administered 2023-02-12: 50 ug via INTRAVENOUS
  Filled 2023-02-12: qty 1

## 2023-02-12 NOTE — ED Notes (Signed)
Called [facility] for transport at 4:23.

## 2023-02-12 NOTE — ED Provider Notes (Signed)
[facility] Provider Note   CSN: [id number] Arrival date & time: 02/12/23  [id number]     History  Chief Complaint  Patient presents with   Abdominal Pain    Max Ayers is a 52 y.o. male, history of pancreatitis with pancreatic pseudocyst,, who presents to the ED secondary to epigastric discomfort has been going on for the last day with associated with nausea, no vomiting.  No fevers, chills, diarrhea.  States since being on for the last day, and described as cramping in nature, and feels like his last bout of pancreatitis, states that it radiates to his back, and a history moderate to severe in nature.  Has not tried anything for the pain.  Denies any alcohol use has not had alcohol for the last year.  Reports that he used to use heavily, and drink daily, but states he was typically only about 1-2 beers today.     Home Medications Prior to Admission medications   Medication Sig Start Date End Date Taking? Authorizing Provider  amLODipine (NORVASC) 5 MG tablet Take 1 tablet (5 mg total) by mouth daily. 01/05/23   Lavina Hamman, MD  hydrALAZINE (APRESOLINE) 100 MG tablet Take 1 tablet (100 mg total) by mouth every 8 (eight) hours. Patient taking differently: Take 100 mg by mouth 3 (three) times daily. 11/22/22   Little Ishikawa, MD  Hyprom-Naphaz-Polysorb-Zn Sulf (CLEAR EYES COMPLETE) SOLN Place 2 drops into both eyes as needed (for irritation).    [provider]  labetalol (NORMODYNE) 100 MG tablet Take 1 tablet (100 mg total) by mouth 2 (two) times daily. 11/22/22   Little Ishikawa, MD  ondansetron (ZOFRAN) 4 MG tablet Take 1 tablet (4 mg total) by mouth every 8 (eight) hours as needed for nausea or vomiting. 01/04/23 01/04/24  Lavina Hamman, MD  oxyCODONE-acetaminophen (PERCOCET/ROXICET) 5-325 MG tablet Take 1 tablet by mouth every 6 (six) hours as needed for severe pain or moderate pain. 01/04/23   Lavina Hamman, MD   pantoprazole (PROTONIX) 40 MG tablet Take 1 tablet (40 mg total) by mouth at bedtime. 01/04/23 04/04/23  Lavina Hamman, MD  polyethylene glycol (MIRALAX / GLYCOLAX) 17 g packet Take 17 g by mouth daily. 01/05/23   Lavina Hamman, MD  TYLENOL 500 MG tablet Take 1,000 mg by mouth as needed for headache or mild pain.    [provider]      Allergies    Yellow jacket venom [bee venom] and Gadolinium derivatives    Review of Systems   Review of Systems  Gastrointestinal:  Positive for abdominal pain and nausea. Negative for diarrhea.    Physical Exam Updated Vital Signs BP (!) 157/69 (BP Location: Right Arm)   Pulse (!) 52   Temp 98.1 F (36.7 C) (Oral)   Resp 18   Ht '5\' 11"'$  (1.803 m)   Wt 89.8 kg   SpO2 97%   BMI 27.62 kg/m  Physical Exam Vitals and nursing note reviewed.  Constitutional:      General: He is not in acute distress.    Appearance: He is well-developed.  HENT:     Head: Normocephalic and atraumatic.  Eyes:     Conjunctiva/sclera: Conjunctivae normal.  Cardiovascular:     Rate and Rhythm: Normal rate and regular rhythm.     Heart sounds: No murmur heard. Pulmonary:     Effort: Pulmonary effort is normal. No respiratory distress.  Breath sounds: Normal breath sounds.  Abdominal:     Palpations: Abdomen is soft.     Tenderness: There is abdominal tenderness in the epigastric area and periumbilical area. There is no right CVA tenderness or left CVA tenderness.  Musculoskeletal:        General: No swelling.     Cervical back: Neck supple.  Skin:    General: Skin is warm and dry.     Capillary Refill: Capillary refill takes less than 2 seconds.  Neurological:     Mental Status: He is alert.  Psychiatric:        Mood and Affect: Mood normal.     ED Results / Procedures / Treatments   Labs (all labs ordered are listed, but only abnormal results are displayed) Labs Reviewed  COMPREHENSIVE METABOLIC PANEL - Abnormal; Notable for the following  components:      Result Value   Glucose, Bld 154 (*)    Calcium 8.6 (*)    All other components within normal limits  LIPASE, BLOOD - Abnormal; Notable for the following components:   Lipase 610 (*)    All other components within normal limits  URINALYSIS, ROUTINE W REFLEX MICROSCOPIC  CBC WITH DIFFERENTIAL/PLATELET  TROPONIN I (HIGH SENSITIVITY)    EKG None  Radiology DG Chest 2 View  Result Date: 02/12/2023 CLINICAL DATA:  Epigastric pain. EXAM: CHEST - 2 VIEW COMPARISON:  None Available. FINDINGS: The cardiopericardial silhouette is within normal limits for size. The nodular density at the left base may be related to superimposition of soft tissue or double shadow The cardiopericardial silhouette is within normal limits for size. The visualized bony structures of the thorax are unremarkable. IMPRESSION: Nodular density at the left base may be related to superimposition of soft tissue or double shadow. Repeat frontal radiograph with nipple markers could be used to further evaluate. Alternatively CT chest without contrast could be performed as clinically warranted. Electronically Signed   By: Misty Stanley M.D.   On: 02/12/2023 09:54    Procedures Procedures   Medications Ordered in ED Medications  sodium chloride 0.9 % bolus 1,000 mL (0 mLs Intravenous Stopped 02/12/23 1050)  fentaNYL (SUBLIMAZE) injection 50 mcg (50 mcg Intravenous Given 02/12/23 0949)  ondansetron (ZOFRAN) injection 4 mg (4 mg Intravenous Given 02/12/23 0947)  HYDROmorphone (DILAUDID) injection 0.5 mg (0.5 mg Intravenous Given 02/12/23 1341)  morphine (PF) 4 MG/ML injection 4 mg (4 mg Intravenous Given 02/12/23 1457)  iohexol (OMNIPAQUE) 300 MG/ML solution 100 mL (100 mLs Intravenous Contrast Given 02/12/23 1609)    ED Course/ Medical Decision Making/ A&P                           Medical Decision Making Patient is a 52 year old, here for severe of epigastric pain that is been going on for the past day, states it Lanora Manis  is back, endorses nausea no vomiting.  Has not been able to eat/secondary to his nausea.  We will obtain lipase, troponin, chest x-ray, labs for further evaluation.  He just had a CT abdomen pelvis last month, thus we will hold off for now.  Amount and/or Complexity of Data Reviewed Labs: ordered.    Details: Lipase is 610, troponin 4 Radiology: ordered.    Details: Chest x-ray clear Discussion of management or test interpretation with external provider(s): Attempted to get patient's pain under control, did 3 IV pain meds still in a lot of pain, continues to have pain,  complaining of this, discussed with Dr. Cathlean Sauer, [facility] hospitalist, he agrees to admit patient, but would like a CT abdomen pelvis, and just to make sure that nothing is being missed.  CT abdomen pelvis ordered. Hospitalist to f/u on results.   Risk Prescription drug management. Decision regarding hospitalization.   Final Clinical Impression(s) / ED Diagnoses Final diagnoses:  Acute on chronic pancreatitis ([place])  Intractable pain    Rx / DC Orders ED Discharge Orders     None         Vastie Douty, Si Gaul, PA 02/12/23 1626    Charlesetta Shanks, MD 02/15/23 [phone]

## 2023-02-12 NOTE — ED Notes (Signed)
Pt states has hx of pancreatis and he states he has adjusted his habits but it keeps coming back c/o mid ot up diffuse abd pain that started a few days ago , has app with GI dr later this month

## 2023-02-12 NOTE — ED Notes (Signed)
Patient transported to CT 

## 2023-02-12 NOTE — Assessment & Plan Note (Signed)
Continue amlodipine and hydralazine.  Holding labetalol given his sinus bradycardia which appears chronic.

## 2023-02-12 NOTE — Assessment & Plan Note (Signed)
Still having pain and nausea.  Lipase 610.  CT again shows findings suspicious for pancreatic pseudocyst which was also evaluated by MRCP 01/03/2023.  He reports abstinence from alcohol for at least 1 year. -Keep n.p.o. tonight except sips with meds -Continue IV fluid hydration overnight -Antiemetics and analgesics as needed

## 2023-02-12 NOTE — ED Triage Notes (Signed)
C/o abdominal pain radiating to back. Hx of pancreatitis and feels similar. C/o nausea, no vomiting.

## 2023-02-12 NOTE — ED Notes (Signed)
ED TO INPATIENT HANDOFF REPORT  ED Nurse Name and Phone #: Kayley Zeiders,RN  S Name/Age/Gender Max Ayers 52 y.o. male Room/Bed: MH05/MH05  Code Status   Code Status: Prior  Home/SNF/Other Home Patient oriented to: self, place, time, and situation Is this baseline? Yes   Triage Complete: Triage complete  Chief Complaint Pancreatitis [K85.90]  Triage Note C/o abdominal pain radiating to back. Hx of pancreatitis and feels similar. C/o nausea, no vomiting.   Allergies Allergies  Allergen Reactions   Yellow Jacket Venom [Bee Venom] Anaphylaxis and Hives   Gadolinium Derivatives Nausea Only    2nd time in a row. Pt received MRI contrast and immediately experienced nausea.     Level of Care/Admitting Diagnosis ED Disposition     ED Disposition  Admit   Condition  --   Comment  Hospital Area: [facility] [[id number]]  Level of Care: Med-Surg [16]  May admit patient to [facility] or [facility] if equivalent level of care is available:: Yes  Interfacility transfer: Yes  Covid Evaluation: Asymptomatic - no recent exposure (last 10 days) testing not required  Diagnosis: Pancreatitis [202663]  Admitting Physician: Tawni Millers [id number]  Attending Physician: Tawni Millers [id number]  Certification:: I certify this patient will need inpatient services for at least 2 midnights  Estimated Length of Stay: 3          B Medical/Surgery History Past Medical History:  Diagnosis Date   Acute alcoholic pancreatitis 0000000   Hypertension    Pancreatitis    Tobacco use 12/12/2020   Past Surgical History:  Procedure Laterality Date   NO PAST SURGERIES       A IV Location/Drains/Wounds Patient Lines/Drains/Airways Status     Active Line/Drains/Airways     Name Placement date Placement time Site Days   Peripheral IV 02/12/23 20 G Anterior;Distal;Right;Upper Arm 02/12/23  0931  Arm  less than 1            Intake/Output  Last 24 hours  Intake/Output Summary (Last 24 hours) at 02/12/2023 1624 Last data filed at 02/12/2023 1050 Gross per 24 hour  Intake 1000.15 ml  Output --  Net 1000.15 ml    Labs/Imaging Results for orders placed or performed during the hospital encounter of 02/12/23 (from the past 48 hour(s))  Comprehensive metabolic panel     Status: Abnormal   Collection Time: 02/12/23  9:30 AM  Result Value Ref Range   Sodium 135 135 - 145 mmol/L   Potassium 3.7 3.5 - 5.1 mmol/L   Chloride 107 98 - 111 mmol/L   CO2 23 22 - 32 mmol/L   Glucose, Bld 154 (H) 70 - 99 mg/dL    Comment: Glucose reference range applies only to samples taken after fasting for at least 8 hours.   BUN 16 6 - 20 mg/dL   Creatinine, Ser 1.03 0.61 - 1.24 mg/dL   Calcium 8.6 (L) 8.9 - 10.3 mg/dL   Total Protein 7.2 6.5 - 8.1 g/dL   Albumin 3.7 3.5 - 5.0 g/dL   AST 20 15 - 41 U/L   ALT 14 0 - 44 U/L   Alkaline Phosphatase 62 38 - 126 U/L   Total Bilirubin 0.7 0.3 - 1.2 mg/dL   GFR, Estimated >60 >60 mL/min    Comment: (NOTE) Calculated using the CKD-EPI Creatinine Equation (2021)    Anion gap 5 5 - 15    Comment: Performed at [facility], [address] [place],  [place] [address]  CBC with Differential     Status: None   Collection Time: 02/12/23  9:30 AM  Result Value Ref Range   WBC 5.2 4.0 - 10.5 K/uL   RBC 4.73 4.22 - 5.81 MIL/uL   Hemoglobin 13.8 13.0 - 17.0 g/dL   HCT 41.0 39.0 - 52.0 %   MCV 86.7 80.0 - 100.0 fL   MCH 29.2 26.0 - 34.0 pg   MCHC 33.7 30.0 - 36.0 g/dL   RDW 13.0 11.5 - 15.5 %   Platelets 219 150 - 400 K/uL   nRBC 0.0 0.0 - 0.2 %   Neutrophils Relative % 61 %   Neutro Abs 3.2 1.7 - 7.7 K/uL   Lymphocytes Relative 32 %   Lymphs Abs 1.7 0.7 - 4.0 K/uL   Monocytes Relative 6 %   Monocytes Absolute 0.3 0.1 - 1.0 K/uL   Eosinophils Relative 1 %   Eosinophils Absolute 0.1 0.0 - 0.5 K/uL   Basophils Relative 0 %   Basophils Absolute 0.0 0.0 - 0.1 K/uL   Immature Granulocytes 0 %    Abs Immature Granulocytes 0.00 0.00 - 0.07 K/uL    Comment: Performed at [facility], [address] [place], [place] [address]  Lipase, blood     Status: Abnormal   Collection Time: 02/12/23  9:30 AM  Result Value Ref Range   Lipase 610 (H) 11 - 51 U/L    Comment: RESULTS CONFIRMED BY MANUAL DILUTION Performed at [facility], [address] [place], [place] [address]   Troponin I (High Sensitivity)     Status: None   Collection Time: 02/12/23  9:30 AM  Result Value Ref Range   Troponin I (High Sensitivity) 4 <18 ng/L    Comment: (NOTE) Elevated high sensitivity troponin I (hsTnI) values and significant  changes across serial measurements may suggest ACS but many other  chronic and acute conditions are known to elevate hsTnI results.  Refer to the "Links" section for chest pain algorithms and additional  guidance. Performed at [facility], [address] [place], [place] [address]   Urinalysis, Routine w reflex microscopic -Urine, Clean Catch     Status: None   Collection Time: 02/12/23  1:12 PM  Result Value Ref Range   Color, Urine YELLOW YELLOW   APPearance CLEAR CLEAR   Specific Gravity, Urine 1.020 1.005 - 1.030   pH 7.0 5.0 - 8.0   Glucose, UA NEGATIVE NEGATIVE mg/dL   Hgb urine dipstick NEGATIVE NEGATIVE   Bilirubin Urine NEGATIVE NEGATIVE   Ketones, ur NEGATIVE NEGATIVE mg/dL   Protein, ur NEGATIVE NEGATIVE mg/dL   Nitrite NEGATIVE NEGATIVE   Leukocytes,Ua NEGATIVE NEGATIVE    Comment: Microscopic not done on urines with negative protein, blood, leukocytes, nitrite, or glucose < 500 mg/dL. Performed at [facility], [address] [place], [place] [address]    DG Chest 2 View  Result Date: 02/12/2023 CLINICAL DATA:  Epigastric pain. EXAM: CHEST - 2 VIEW COMPARISON:  None Available. FINDINGS: The cardiopericardial silhouette is within normal limits for size. The nodular density at the left base may be related to  superimposition of soft tissue or double shadow The cardiopericardial silhouette is within normal limits for size. The visualized bony structures of the thorax are unremarkable. IMPRESSION: Nodular density at the left base may be related to superimposition of soft tissue or double shadow. Repeat frontal radiograph with nipple markers could be  used to further evaluate. Alternatively CT chest without contrast could be performed as clinically warranted. Electronically Signed   By: Misty Stanley M.D.   On: 02/12/2023 09:54    Pending Labs Unresulted Labs (From admission, onward)    None       Vitals/Pain Today's Vitals   02/12/23 1233 02/12/23 1417 02/12/23 1552 02/12/23 1621  BP:  136/60  (!) 157/69  Pulse:  60  (!) 52  Resp:  20  18  Temp: 98.1 F (36.7 C)   98.1 F (36.7 C)  TempSrc: Oral   Oral  SpO2:  100%  97%  Weight:      Height:      PainSc:   6      Isolation Precautions No active isolations  Medications Medications  sodium chloride 0.9 % bolus 1,000 mL (0 mLs Intravenous Stopped 02/12/23 1050)  fentaNYL (SUBLIMAZE) injection 50 mcg (50 mcg Intravenous Given 02/12/23 0949)  ondansetron (ZOFRAN) injection 4 mg (4 mg Intravenous Given 02/12/23 0947)  HYDROmorphone (DILAUDID) injection 0.5 mg (0.5 mg Intravenous Given 02/12/23 1341)  morphine (PF) 4 MG/ML injection 4 mg (4 mg Intravenous Given 02/12/23 1457)  iohexol (OMNIPAQUE) 300 MG/ML solution 100 mL (100 mLs Intravenous Contrast Given 02/12/23 1609)    Mobility walks     Focused Assessments     R Recommendations: See Admitting Provider Note  Report given to:   Additional Notes:

## 2023-02-12 NOTE — H&P (Signed)
History and Physical    Max Ayers [id number] DOB: Dec 08, 1971 DOA: 02/12/2023  PCP: Willeen Niece, PA  Patient coming from: Home  I have personally briefly reviewed patient's old medical records in [place]  Chief Complaint: Epigastric pain  HPI: Max Ayers is a 52 y.o. male with medical history significant for recurrent pancreatitis, pancreatic pseudocyst, history of alcohol use in remission, HTN who presented to the ED for evaluation of epigastric pain.  Patient states that he ate fried fish last night (3/2) for dinner.  A few hours later he developed epigastric pain.  Initially he thought this might have been gas but over the night he developed severe epigastric pain radiating to his back which was consistent with his prior pancreatitis episodes.  He has had nausea but no emesis.  Denies fevers, chills, diarrhea.  He reports he has abstained from alcohol use for at least 1 year.  He is not smoking anymore.  [facility] ED Course  Labs/Imaging on admission: I have personally reviewed following labs and imaging studies.  Initial vitals showed BP 143/84, pulse 81, RR 18, temp 98.3 F, SpO2 90% on room air.  Labs show WBC 5.2, hemoglobin 13.8, platelets 219,000, sodium 135, potassium 3.7, bicarb 23, BUN 16, creatinine 1.03, serum glucose 154, LFTs within normal limits, lipase 610.  Troponin 4.  UA negative.  2 view chest x-ray showed nodular density at the left base which may be related to superimposition of soft tissue or double shadow..  CT abdomen/pelvis with contrast showed interval improvement in peripancreatic and mesenteric fat stranding with some residual.  Persistent pancreatic ductal dilatation with a cystic lesion in the uncinate process measuring 17 x 12 mm noted.  Possible pseudocyst.  Patient was given 1 L normal saline, IV fentanyl 50 mcg, IV Dilaudid 0.5 mg, IV morphine 4 mg, Zofran.  The hospitalist service was consulted to admit for further  evaluation and management.  Review of Systems: All systems reviewed and are negative except as documented in history of present illness above.   Past Medical History:  Diagnosis Date   Acute alcoholic pancreatitis 0000000   Hypertension    Pancreatitis    Tobacco use 12/12/2020    Past Surgical History:  Procedure Laterality Date   NO PAST SURGERIES      Social History:  reports that he has quit smoking. His smoking use included cigarettes and cigars. He smoked an average of 1 pack per day. He has never used smokeless tobacco. He reports that he does not currently use alcohol. He reports that he does not use drugs.  Allergies  Allergen Reactions   Yellow Jacket Venom [Bee Venom] Anaphylaxis and Hives   Gadolinium Derivatives Nausea Only    2nd time in a row. Pt received MRI contrast and immediately experienced nausea.     Family History  Problem Relation Age of Onset   Diabetes Mother    Hypertension Father    Breast cancer Maternal Grandmother    Colon cancer Neg Hx    Stomach cancer Neg Hx    Liver disease Neg Hx    Pancreatic cancer Neg Hx    Esophageal cancer Neg Hx    Rectal cancer Neg Hx      Prior to Admission medications   Medication Sig Start Date End Date Taking? Authorizing Provider  amLODipine (NORVASC) 5 MG tablet Take 1 tablet (5 mg total) by mouth daily. 01/05/23   Lavina Hamman, MD  hydrALAZINE (APRESOLINE) 100 MG tablet  Take 1 tablet (100 mg total) by mouth every 8 (eight) hours. Patient taking differently: Take 100 mg by mouth 3 (three) times daily. 11/22/22   Little Ishikawa, MD  Hyprom-Naphaz-Polysorb-Zn Sulf (CLEAR EYES COMPLETE) SOLN Place 2 drops into both eyes as needed (for irritation).    [provider]  labetalol (NORMODYNE) 100 MG tablet Take 1 tablet (100 mg total) by mouth 2 (two) times daily. 11/22/22   Little Ishikawa, MD  ondansetron (ZOFRAN) 4 MG tablet Take 1 tablet (4 mg total) by mouth every 8 (eight) hours  as needed for nausea or vomiting. 01/04/23 01/04/24  Lavina Hamman, MD  oxyCODONE-acetaminophen (PERCOCET/ROXICET) 5-325 MG tablet Take 1 tablet by mouth every 6 (six) hours as needed for severe pain or moderate pain. 01/04/23   Lavina Hamman, MD  pantoprazole (PROTONIX) 40 MG tablet Take 1 tablet (40 mg total) by mouth at bedtime. 01/04/23 04/04/23  Lavina Hamman, MD  polyethylene glycol (MIRALAX / GLYCOLAX) 17 g packet Take 17 g by mouth daily. 01/05/23   Lavina Hamman, MD  TYLENOL 500 MG tablet Take 1,000 mg by mouth as needed for headache or mild pain.    [provider]    Physical Exam: Vitals:   02/12/23 1417 02/12/23 1621 02/12/23 1645 02/12/23 1842  BP: 136/60 (!) 157/69 (!) 150/82 (!) 157/78  Pulse: 60 (!) 52 (!) 58 (!) 54  Resp: '20 18  14  '$ Temp:  98.1 F (36.7 C)  97.6 F (36.4 C)  TempSrc:  Oral    SpO2: 100% 97% 97% 99%  Weight:      Height:       Constitutional: Resting in bed, NAD, calm, comfortable Eyes: EOMI, lids and conjunctivae normal ENMT: Mucous membranes are moist. Posterior pharynx clear of any exudate or lesions.Normal dentition.  Neck: normal, supple, no masses. Respiratory: clear to auscultation bilaterally, no wheezing, no crackles. Normal respiratory effort. No accessory muscle use.  Cardiovascular: Regular rate and rhythm, no murmurs / rubs / gallops. No extremity edema. 2+ pedal pulses. Abdomen: Epigastric and RLQ tenderness, no masses palpated.  Musculoskeletal: no clubbing / cyanosis. No joint deformity upper and lower extremities. Good ROM, no contractures. Normal muscle tone.  Skin: no rashes, lesions, ulcers. No induration Neurologic: Sensation intact. Strength 5/5 in all 4.  Psychiatric: Normal judgment and insight. Alert and oriented x 3. Normal mood.   EKG: Ordered and pending.  Assessment/Plan Principal Problem:   Acute recurrent pancreatitis Active Problems:   Benign essential HTN   Pancreatic pseudocyst   Max Ayers is a  52 y.o. male with medical history significant for recurrent pancreatitis, pancreatic pseudocyst, history of alcohol use in remission, HTN who is admitted with acute recurrent pancreatitis.  Assessment and Plan: * Acute recurrent pancreatitis Still having pain and nausea.  Lipase 610.  CT again shows findings suspicious for pancreatic pseudocyst which was also evaluated by MRCP 01/03/2023.  He reports abstinence from alcohol for at least 1 year. -Keep n.p.o. tonight except sips with meds -Continue IV fluid hydration overnight -Antiemetics and analgesics as needed  Benign essential HTN Continue amlodipine and hydralazine.  Holding labetalol given his sinus bradycardia which appears chronic.  DVT prophylaxis: enoxaparin (LOVENOX) injection 40 mg Start: 02/12/23 2200 Code Status: Full code, confirmed with patient on admission Family Communication: Discussed with patient, he has discussed with family Disposition Plan: From home and likely discharge to home pending pain control and ability maintain adequate oral intake Consults called: None Severity  of Illness: The appropriate patient status for this patient is INPATIENT. Inpatient status is judged to be reasonable and necessary in order to provide the required intensity of service to ensure the patient's safety. The patient's presenting symptoms, physical exam findings, and initial radiographic and laboratory data in the context of their chronic comorbidities is felt to place them at high risk for further clinical deterioration. Furthermore, it is not anticipated that the patient will be medically stable for discharge from the hospital within 2 midnights of admission.   * I certify that at the point of admission it is my clinical judgment that the patient will require inpatient hospital care spanning beyond 2 midnights from the point of admission due to high intensity of service, high risk for further deterioration and high frequency of surveillance  required.Zada Finders MD [facility]  If 7PM-7AM, please contact night-coverage [email or web address]  02/12/2023, 7:40 PM

## 2023-02-12 NOTE — Hospital Course (Signed)
Max Ayers is a 52 y.o. male with medical history significant for recurrent pancreatitis, pancreatic pseudocyst, history of alcohol use in remission, HTN who is admitted with acute recurrent pancreatitis.

## 2023-02-13 DIAGNOSIS — K859 Acute pancreatitis without necrosis or infection, unspecified: Secondary | ICD-10-CM | POA: Diagnosis not present

## 2023-02-13 LAB — BASIC METABOLIC PANEL
Anion gap: 8 (ref 5–15)
BUN: 9 mg/dL (ref 6–20)
CO2: 24 mmol/L (ref 22–32)
Calcium: 8.8 mg/dL — ABNORMAL LOW (ref 8.9–10.3)
Chloride: 107 mmol/L (ref 98–111)
Creatinine, Ser: 0.82 mg/dL (ref 0.61–1.24)
GFR, Estimated: >60 mL/min (ref >60)
Glucose, Bld: 113 mg/dL — ABNORMAL HIGH (ref 70–99)
Potassium: 3.6 mmol/L (ref 3.5–5.1)
Sodium: 139 mmol/L (ref 135–145)

## 2023-02-13 LAB — LIPID PANEL
Cholesterol: 210 mg/dL — ABNORMAL HIGH (ref 0–200)
HDL: 34 mg/dL — ABNORMAL LOW (ref >40)
LDL Cholesterol: 153 mg/dL — ABNORMAL HIGH (ref 0–99)
Total CHOL/HDL Ratio: 6.2 RATIO
Triglycerides: 114 mg/dL (ref <150)
VLDL: 23 mg/dL (ref 0–40)

## 2023-02-13 LAB — CBC
HCT: 40.7 % (ref 39.0–52.0)
Hemoglobin: 13.5 g/dL (ref 13.0–17.0)
MCH: 29.5 pg (ref 26.0–34.0)
MCHC: 33.2 g/dL (ref 30.0–36.0)
MCV: 89.1 fL (ref 80.0–100.0)
Platelets: 168 10*3/uL (ref 150–400)
RBC: 4.57 MIL/uL (ref 4.22–5.81)
RDW: 13.1 % (ref 11.5–15.5)
WBC: 4.7 10*3/uL (ref 4.0–10.5)
nRBC: 0 % (ref 0.0–0.2)

## 2023-02-13 MED ORDER — HYDROMORPHONE HCL 1 MG/ML IJ SOLN
1.0000 mg | INTRAMUSCULAR | Status: AC | PRN
  Administered 2023-02-13 – 2023-02-15 (×8): 1 mg via INTRAVENOUS
  Filled 2023-02-13 (×8): qty 1

## 2023-02-13 MED ORDER — HYDRALAZINE HCL 20 MG/ML IJ SOLN
10.0000 mg | Freq: Four times a day (QID) | INTRAMUSCULAR | Status: DC | PRN
  Filled 2023-02-13: qty 1

## 2023-02-13 MED ORDER — LACTATED RINGERS IV SOLN: INTRAVENOUS | Status: AC

## 2023-02-13 NOTE — Plan of Care (Signed)

## 2023-02-13 NOTE — TOC Initial Note (Signed)
Transition of Care [facility]) - Initial/Assessment Note    Patient Details  Name: Max Ayers MRN: [id number] Date of Birth: 22-Apr-1971  Transition of Care [facility]) CM/SW Contact:    Ninfa Meeker, RN Phone Number: 02/13/2023, 11:42 AM  Clinical Narrative:                  Transition of Care [facility]) Department has reviewed patient and no TOC needs have been identified at this time. We will continue to monitor patient advancement through Interdisciplinary progressions and if new patient needs arise, please place a consult.       Patient Goals and CMS Choice            Expected Discharge Plan and Services                                              Prior Living Arrangements/Services                       Activities of Daily Living Home Assistive Devices/Equipment: None ADL Screening (condition at time of admission) Patient's cognitive ability adequate to safely complete daily activities?: Yes Is the patient deaf or have difficulty hearing?: No Does the patient have difficulty seeing, even when wearing glasses/contacts?: No Does the patient have difficulty concentrating, remembering, or making decisions?: No Patient able to express need for assistance with ADLs?: No Does the patient have difficulty dressing or bathing?: No Independently performs ADLs?: Yes (appropriate for developmental age) Does the patient have difficulty walking or climbing stairs?: No Weakness of Legs: None Weakness of Arms/Hands: None  Permission Sought/Granted                  Emotional Assessment              Admission diagnosis:  Pancreatitis [K85.90] Intractable pain [R52] Acute on chronic pancreatitis ([place]) [K85.90, K86.1] Patient Active Problem List   Diagnosis Date Noted   Acute recurrent pancreatitis 02/12/2023   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   Acute on chronic pancreatitis ([place]) 08/05/2022   Constipation 12/17/2021   HLD (hyperlipidemia) 12/14/2021    Pancreatic cyst 12/14/2021   Aortic atherosclerosis ([place]) 09/17/2021   Sinus bradycardia 09/17/2021   Overweight with a BMI of 28.59 kg/m 09/17/2021   Benign essential HTN 12/12/2020   History of alcohol abuse 12/12/2020   PCP:  Willeen Niece, PA Pharmacy:   [facility] [place] [place] [address] Phone: [phone] Fax: [phone]     Social Determinants of Health (SDOH) Social History: SDOH Screenings   Food Insecurity: No Food Insecurity (02/13/2023)  Housing: Low Risk  (02/13/2023)  Transportation Needs: No Transportation Needs (02/13/2023)  Utilities: At Risk (02/13/2023)  Tobacco Use: Medium Risk (02/12/2023)   SDOH Interventions:     Readmission Risk Interventions    09/08/2022   12:46 PM 07/05/2022    1:22 PM  Readmission Risk Prevention Plan  Post Dischage Appt  Complete  Medication Screening  Complete  Transportation Screening Complete Complete  PCP or Specialist Appt within 3-5 Days Complete   HRI or [place] Complete   Social Work Consult for [place] Planning/Counseling Complete   Palliative Care Screening Not Applicable   Medication Review Press photographer) Complete

## 2023-02-13 NOTE — Progress Notes (Signed)
PROGRESS NOTE   Max Ayers  [id number] DOB: [birth year] DOA: 02/12/2023 PCP: Willeen Niece, PA  Brief Narrative:  52 year old black male Prior EtOH quit April 2023--first diagnosed with pancreatitis 08/2020 and admitted for the same several times Is known to have a pancreatic pseudocyst that has been stable He was last seen at [facility] health 09/13/2022 as an inpatient consult and recommended to avoid vaping Zetia and was told to follow-up in the outpatient setting for EGD  Night before coming in on 6 3/2 he ate fried fish for dinner developed epigastric pain thought it was gas it became severe he came to the emergency room  CT abdomen pelvis interval improvement peripancreatic and mesenteric fat stranding with residual issues Cystic lesion in the uncinate process 17 X 12 mm noted Rx saline fentanyl Dilaudid morphine Zofran  Hospital-Problem based course  Acute superimposed on chronic pancreatitis Known pancreatic pseudocyst 12 X 17 mm - Cut back saline, soft diet, force fluids -Pain control Norco 1-2 every 4 as needed Dilaudid 1 mg every 4 as needed severe pain but watch heart rate  HTN - Continue amlodipine 5 monitor pressures   DVT prophylaxis: Lovenox Code Status: Full Family Communication: Wife present at the bedside Disposition:  Status is: Inpatient Remains inpatient appropriate because:   Not ready for discharge   Consultants:  None   Subjective: It is moderate today he thinks that he can manage a soft diet as he is tolerating oral liquids He has no chest pain he rates his pain at 5/10 he is passing stool he is passing gas he has not vomited since yesterday  Objective: Vitals:   02/13/23 0436 02/13/23 0900 02/13/23 1232 02/13/23 1310  BP: 124/73   125/69  Pulse: (!) 51 (!) 59 79 62  Resp: 15   18  Temp: 97.9 F (36.6 C)   98.6 F (37 C)  TempSrc: Oral   Oral  SpO2: 97%   98%  Weight:      Height:        Intake/Output Summary (Last 24 hours) at  02/13/2023 1331 Last data filed at 02/13/2023 0806 Gross per 24 hour  Intake 0 ml  Output 1000 ml  Net -1000 ml   Filed Weights   02/12/23 0824  Weight: 89.8 kg    Examination:  EOMI NCAT no focal deficit no icterus no pallor no rales no rhonchi no wheeze Mild proptosis Chest is clear no added sound no rales rhonchi Abdomen is slightly tender in the epigastrium somewhat distended none tympanitic no rebound No lower extremity edema GU deferred Rectal deferred Neurologically intact to gross motor  Data Reviewed: personally reviewed   CBC    Component Value Date/Time   WBC 4.7 02/13/2023 0353   RBC 4.57 02/13/2023 0353   HGB 13.5 02/13/2023 0353   HCT 40.7 02/13/2023 0353   PLT 168 02/13/2023 0353   MCV 89.1 02/13/2023 0353   MCH 29.5 02/13/2023 0353   MCHC 33.2 02/13/2023 0353   RDW 13.1 02/13/2023 0353   LYMPHSABS 1.7 02/12/2023 0930   MONOABS 0.3 02/12/2023 0930   EOSABS 0.1 02/12/2023 0930   BASOSABS 0.0 02/12/2023 0930      Latest Ref Rng & Units 02/13/2023    3:53 AM 02/12/2023    9:30 AM 01/03/2023    4:17 AM  CMP  Glucose 70 - 99 mg/dL 113  154  113   BUN 6 - 20 mg/dL '9  16  7   '$ Creatinine 0.61 - 1.24 mg/dL  0.82  1.03  0.92   Sodium 135 - 145 mmol/L 139  135  136   Potassium 3.5 - 5.1 mmol/L 3.6  3.7  3.5   Chloride 98 - 111 mmol/L 107  107  103   CO2 22 - 32 mmol/L '24  23  26   '$ Calcium 8.9 - 10.3 mg/dL 8.8  8.6  8.7   Total Protein 6.5 - 8.1 g/dL  7.2  6.8   Total Bilirubin 0.3 - 1.2 mg/dL  0.7  0.3   Alkaline Phos 38 - 126 U/L  62  61   AST 15 - 41 U/L  20  23   ALT 0 - 44 U/L  14  20      Radiology Studies: CT ABDOMEN PELVIS W CONTRAST  Result Date: 02/12/2023 CLINICAL DATA:  Epigastric pain radiating to the back. Previous history of pancreatitis EXAM: CT ABDOMEN AND PELVIS WITH CONTRAST TECHNIQUE: Multidetector CT imaging of the abdomen and pelvis was performed using the standard protocol following bolus administration of intravenous contrast.  RADIATION DOSE REDUCTION: This exam was performed according to the departmental dose-optimization program which includes automated exposure control, adjustment of the mA and/or kV according to patient size and/or use of iterative reconstruction technique. CONTRAST:  168m OMNIPAQUE IOHEXOL 300 MG/ML  SOLN COMPARISON:  MRI 01/03/2023.  CT scan 12/30/2022 FINDINGS: Lower chest: There is some linear opacity lung bases likely scar or atelectasis. No pleural effusion. Hepatobiliary: No space-occupying liver lesion. Patent portal vein. Gallbladder is nondilated. Pancreas: Once again there is severe pancreatic ductal dilatation in the midbody. There continues to be some peripancreatic fat stranding particularly towards the head and neck region but the level of stranding is less in the study of January 2024. In addition there is a cystic lesion identified in the uncinate process of the pancreas. Previously this measured 17 x 11 mm by CT and non MRI 17 x 13 mm. Today this area measures 17 by 12 mm. Pseudocysts. This does not clearly communicate with pancreatic duct on coronal imaging. No new fluid collection identified. Preserved pancreatic parenchymal enhancement. Spleen: Normal in size without focal abnormality. The splenic vein is intact. Adrenals/Urinary Tract: Adrenal glands are preserved. No enhancing renal mass or collecting system dilatation. The ureters have normal course and caliber extending down to the bladder. Preserved contours of the urinary bladder. Stomach/Bowel: Moderate colonic stool. Few scattered colonic diverticula are seen including along the right side of the colon. Normal retrocecal appendix. Stomach is mildly distended with fluid and some air. Vascular/Lymphatic: Normal caliber aorta and IVC. Mild atherosclerotic partially calcified plaque along the aorta and branch vessels. There are some small nodes identified in the retroperitoneum which are not pathologic by size criteria. Similar along the  mesentery Reproductive: Prostate is unremarkable. Other: No free air.  No frank ascites. Musculoskeletal: No acute or significant osseous findings. IMPRESSION: Interval improvement in the peripancreatic and mesenteric fat stranding with some residual. Persistent pancreatic ductal dilatation with a cystic lesion in the uncinate process measuring 17 x 12 mm. Possible pseudocyst. Preserved pancreatic parenchyma. Please correlate for any persistent clinical evidence of acute on subacute pancreatitis Scattered colonic stool with some diverticula.  Normal appendix. Electronically Signed   By: AJill SideM.D.   On: 02/12/2023 16:29   DG Chest 2 View  Result Date: 02/12/2023 CLINICAL DATA:  Epigastric pain. EXAM: CHEST - 2 VIEW COMPARISON:  None Available. FINDINGS: The cardiopericardial silhouette is within normal limits for size. The nodular density at the  left base may be related to superimposition of soft tissue or double shadow The cardiopericardial silhouette is within normal limits for size. The visualized bony structures of the thorax are unremarkable. IMPRESSION: Nodular density at the left base may be related to superimposition of soft tissue or double shadow. Repeat frontal radiograph with nipple markers could be used to further evaluate. Alternatively CT chest without contrast could be performed as clinically warranted. Electronically Signed   By: Misty Stanley M.D.   On: 02/12/2023 09:54     Scheduled Meds:  amLODipine  5 mg Oral Daily   enoxaparin (LOVENOX) injection  40 mg Subcutaneous Q24H   hydrALAZINE  100 mg Oral TID   Continuous Infusions:  lactated ringers 50 mL/hr at 02/13/23 1237     LOS: 1 day   Time spent: 65  Nita Sells, MD [facility] To contact the attending provider between 7A-7P or the covering provider during after hours 7P-7A, please log into the web site [email or web address] and access using universal [place] password for that web site. If you do not have  the password, please call the hospital operator.  02/13/2023, 1:31 PM

## 2023-02-14 LAB — COMPREHENSIVE METABOLIC PANEL
ALT: 12 U/L (ref 0–44)
AST: 15 U/L (ref 15–41)
Albumin: 3.4 g/dL — ABNORMAL LOW (ref 3.5–5.0)
Alkaline Phosphatase: 57 U/L (ref 38–126)
Anion gap: 7 (ref 5–15)
BUN: 7 mg/dL (ref 6–20)
CO2: 24 mmol/L (ref 22–32)
Calcium: 8.7 mg/dL — ABNORMAL LOW (ref 8.9–10.3)
Chloride: 106 mmol/L (ref 98–111)
Creatinine, Ser: 0.89 mg/dL (ref 0.61–1.24)
GFR, Estimated: >60 mL/min (ref >60)
Glucose, Bld: 133 mg/dL — ABNORMAL HIGH (ref 70–99)
Potassium: 3.3 mmol/L — ABNORMAL LOW (ref 3.5–5.1)
Sodium: 137 mmol/L (ref 135–145)
Total Bilirubin: 0.4 mg/dL (ref 0.3–1.2)
Total Protein: 6.5 g/dL (ref 6.5–8.1)

## 2023-02-14 MED ORDER — POTASSIUM CHLORIDE CRYS ER 20 MEQ PO TBCR
40.0000 meq | EXTENDED_RELEASE_TABLET | Freq: Every day | ORAL | Status: DC
  Administered 2023-02-14 – 2023-02-16 (×3): 40 meq via ORAL
  Filled 2023-02-14 (×3): qty 2

## 2023-02-14 NOTE — Plan of Care (Signed)

## 2023-02-14 NOTE — Progress Notes (Signed)
PROGRESS NOTE   Max Ayers  [id number] DOB: 02/05/1971 DOA: 02/12/2023 PCP: Willeen Niece, PA  Brief Narrative:  52 year old black male Prior EtOH quit April 2023--first diagnosed with pancreatitis 08/2020 and admitted for the same several times Is known to have a pancreatic pseudocyst that has been stable He was last seen at [facility] health 09/13/2022 as an inpatient consult and recommended to avoid vaping Zetia and was told to follow-up in the outpatient setting for EGD  Night before coming in on 6 3/2 he ate fried fish for dinner developed epigastric pain thought it was gas it became severe he came to the emergency room  CT abdomen pelvis interval improvement peripancreatic and mesenteric fat stranding with residual issues Cystic lesion in the uncinate process 17 X 12 mm noted Rx saline fentanyl Dilaudid morphine Zofran  Hospital-Problem based course  Acute superimposed on chronic pancreatitis Known pancreatic pseudocyst 12 X 17 mm - Could not tolerate soft diet today felt nauseous, please give oral pain meds first and if only absolutely necessary use IV as he had some mild constitutional bradycardia - Back down to full liquid diet - If no better in a.m. please call unassigned GI-hopefully will resolve  Mild hypokalemia - Replace 40 mEq daily - Recheck labs a.m.  HTN - Continue amlodipine 5 monitor pressures   DVT prophylaxis: Lovenox Code Status: Full Family Communication: Wife present at the bedside discussed with her plan of care Disposition:  Status is: Inpatient Remains inpatient appropriate because:   Not ready for discharge   Consultants:  None   Subjective: It is moderate today he thinks that he can manage a soft diet as he is tolerating oral liquids He has no chest pain he rates his pain at 5/10 he is passing stool he is passing gas he has not vomited since yesterday  Objective: Vitals:   02/13/23 1703 02/13/23 2033 02/14/23 0453 02/14/23 0526  BP:   132/71 (!) 142/85 137/84  Pulse: 68 66 (!) 59 (!) 58  Resp:  '18 16 17  '$ Temp:  98.4 F (36.9 C) 98.7 F (37.1 C) 98.4 F (36.9 C)  TempSrc:   Oral   SpO2:  97% 98% 98%  Weight:      Height:        Intake/Output Summary (Last 24 hours) at 02/14/2023 1129 Last data filed at 02/14/2023 0600 Gross per 24 hour  Intake 480 ml  Output --  Net 480 ml    Filed Weights   02/12/23 0824  Weight: 89.8 kg    Examination:  EOMI NCAT no focal deficit no icterus no pallor no rales no rhonchi no wheeze Chest is clear no added sound no rales rhonchi Abdomen is slightly tender in the epigastrium somewhat distended non tympanitic no rebound No lower extremity edema GU deferred Rectal deferred Neurologically intact to gross motor  Data Reviewed: personally reviewed   CBC    Component Value Date/Time   WBC 4.7 02/13/2023 0353   RBC 4.57 02/13/2023 0353   HGB 13.5 02/13/2023 0353   HCT 40.7 02/13/2023 0353   PLT 168 02/13/2023 0353   MCV 89.1 02/13/2023 0353   MCH 29.5 02/13/2023 0353   MCHC 33.2 02/13/2023 0353   RDW 13.1 02/13/2023 0353   LYMPHSABS 1.7 02/12/2023 0930   MONOABS 0.3 02/12/2023 0930   EOSABS 0.1 02/12/2023 0930   BASOSABS 0.0 02/12/2023 0930      Latest Ref Rng & Units 02/14/2023    4:04 AM 02/13/2023    3:53  AM 02/12/2023    9:30 AM  CMP  Glucose 70 - 99 mg/dL 133  113  154   BUN 6 - 20 mg/dL '7  9  16   '$ Creatinine 0.61 - 1.24 mg/dL 0.89  0.82  1.03   Sodium 135 - 145 mmol/L 137  139  135   Potassium 3.5 - 5.1 mmol/L 3.3  3.6  3.7   Chloride 98 - 111 mmol/L 106  107  107   CO2 22 - 32 mmol/L '24  24  23   '$ Calcium 8.9 - 10.3 mg/dL 8.7  8.8  8.6   Total Protein 6.5 - 8.1 g/dL 6.5   7.2   Total Bilirubin 0.3 - 1.2 mg/dL 0.4   0.7   Alkaline Phos 38 - 126 U/L 57   62   AST 15 - 41 U/L 15   20   ALT 0 - 44 U/L 12   14      Radiology Studies: CT ABDOMEN PELVIS W CONTRAST  Result Date: 02/12/2023 CLINICAL DATA:  Epigastric pain radiating to the back. Previous  history of pancreatitis EXAM: CT ABDOMEN AND PELVIS WITH CONTRAST TECHNIQUE: Multidetector CT imaging of the abdomen and pelvis was performed using the standard protocol following bolus administration of intravenous contrast. RADIATION DOSE REDUCTION: This exam was performed according to the departmental dose-optimization program which includes automated exposure control, adjustment of the mA and/or kV according to patient size and/or use of iterative reconstruction technique. CONTRAST:  142m OMNIPAQUE IOHEXOL 300 MG/ML  SOLN COMPARISON:  MRI 01/03/2023.  CT scan 12/30/2022 FINDINGS: Lower chest: There is some linear opacity lung bases likely scar or atelectasis. No pleural effusion. Hepatobiliary: No space-occupying liver lesion. Patent portal vein. Gallbladder is nondilated. Pancreas: Once again there is severe pancreatic ductal dilatation in the midbody. There continues to be some peripancreatic fat stranding particularly towards the head and neck region but the level of stranding is less in the study of January 2024. In addition there is a cystic lesion identified in the uncinate process of the pancreas. Previously this measured 17 x 11 mm by CT and non MRI 17 x 13 mm. Today this area measures 17 by 12 mm. Pseudocysts. This does not clearly communicate with pancreatic duct on coronal imaging. No new fluid collection identified. Preserved pancreatic parenchymal enhancement. Spleen: Normal in size without focal abnormality. The splenic vein is intact. Adrenals/Urinary Tract: Adrenal glands are preserved. No enhancing renal mass or collecting system dilatation. The ureters have normal course and caliber extending down to the bladder. Preserved contours of the urinary bladder. Stomach/Bowel: Moderate colonic stool. Few scattered colonic diverticula are seen including along the right side of the colon. Normal retrocecal appendix. Stomach is mildly distended with fluid and some air. Vascular/Lymphatic: Normal caliber  aorta and IVC. Mild atherosclerotic partially calcified plaque along the aorta and branch vessels. There are some small nodes identified in the retroperitoneum which are not pathologic by size criteria. Similar along the mesentery Reproductive: Prostate is unremarkable. Other: No free air.  No frank ascites. Musculoskeletal: No acute or significant osseous findings. IMPRESSION: Interval improvement in the peripancreatic and mesenteric fat stranding with some residual. Persistent pancreatic ductal dilatation with a cystic lesion in the uncinate process measuring 17 x 12 mm. Possible pseudocyst. Preserved pancreatic parenchyma. Please correlate for any persistent clinical evidence of acute on subacute pancreatitis Scattered colonic stool with some diverticula.  Normal appendix. Electronically Signed   By: AJill SideM.D.   On: 02/12/2023 16:29  Scheduled Meds:  amLODipine  5 mg Oral Daily   enoxaparin (LOVENOX) injection  40 mg Subcutaneous Q24H   hydrALAZINE  100 mg Oral TID   potassium chloride  40 mEq Oral Daily   Continuous Infusions:     LOS: 2 days   Time spent: 20  Nita Sells, MD [facility] To contact the attending provider between 7A-7P or the covering provider during after hours 7P-7A, please log into the web site [email or web address] and access using universal [place] password for that web site. If you do not have the password, please call the hospital operator.  02/14/2023, 11:29 AM

## 2023-02-15 LAB — CBC WITH DIFFERENTIAL/PLATELET
Abs Immature Granulocytes: 0 10*3/uL (ref 0.00–0.07)
Basophils Absolute: 0 10*3/uL (ref 0.0–0.1)
Basophils Relative: 0 %
Eosinophils Absolute: 0.1 10*3/uL (ref 0.0–0.5)
Eosinophils Relative: 2 %
HCT: 41.9 % (ref 39.0–52.0)
Hemoglobin: 14 g/dL (ref 13.0–17.0)
Immature Granulocytes: 0 %
Lymphocytes Relative: 51 %
Lymphs Abs: 2.4 10*3/uL (ref 0.7–4.0)
MCH: 29.5 pg (ref 26.0–34.0)
MCHC: 33.4 g/dL (ref 30.0–36.0)
MCV: 88.2 fL (ref 80.0–100.0)
Monocytes Absolute: 0.5 10*3/uL (ref 0.1–1.0)
Monocytes Relative: 11 %
Neutro Abs: 1.7 10*3/uL (ref 1.7–7.7)
Neutrophils Relative %: 36 %
Platelets: 212 10*3/uL (ref 150–400)
RBC: 4.75 MIL/uL (ref 4.22–5.81)
RDW: 13.1 % (ref 11.5–15.5)
WBC: 4.7 10*3/uL (ref 4.0–10.5)
nRBC: 0 % (ref 0.0–0.2)

## 2023-02-15 LAB — BASIC METABOLIC PANEL
Anion gap: 6 (ref 5–15)
BUN: 6 mg/dL (ref 6–20)
CO2: 26 mmol/L (ref 22–32)
Calcium: 9.1 mg/dL (ref 8.9–10.3)
Chloride: 106 mmol/L (ref 98–111)
Creatinine, Ser: 0.98 mg/dL (ref 0.61–1.24)
GFR, Estimated: >60 mL/min (ref >60)
Glucose, Bld: 97 mg/dL (ref 70–99)
Potassium: 3.4 mmol/L — ABNORMAL LOW (ref 3.5–5.1)
Sodium: 138 mmol/L (ref 135–145)

## 2023-02-15 MED ORDER — OXYCODONE-ACETAMINOPHEN 5-325 MG PO TABS
1.0000 | ORAL_TABLET | ORAL | Status: DC | PRN
  Administered 2023-02-15 – 2023-02-16 (×3): 1 via ORAL
  Filled 2023-02-15 (×3): qty 1

## 2023-02-15 NOTE — Plan of Care (Signed)

## 2023-02-15 NOTE — Progress Notes (Signed)
Mobility Specialist - Progress Note   02/15/23 0945  Mobility  Activity Ambulated with assistance in hallway  Level of Assistance Independent after set-up  Assistive Device None  Distance Ambulated (ft) 350 ft  Activity Response Tolerated well  Mobility Referral Yes  $Mobility charge 1 Mobility   Pt received in bed and agreed to mobility. Had no issues during session. Pt returned to bed with all needs met.  Roderick Pee Mobility Specialist

## 2023-02-15 NOTE — Progress Notes (Signed)
PROGRESS NOTE  Max Ayers  [id number] DOB: [birth year] DOA: 02/12/2023 PCP: Willeen Niece, PA   Brief Narrative: Patient is a 52 year old male with history of prior alcohol abuse, pancreatitis, pancreatic pseudocyst, numerous admissions for pancreatitis presented with severe epigastric pain.  CT abdomen/pelvis on admission showed interval improvement in peripancreatic and mesenteric fat stranding with residual pseudocyst/cystic lesion in the unclear process.  Patient was started on management for pancreatitis.  Hospital course remarkable for severe persistent pain.  Assessment & Plan:  Principal Problem:   Acute recurrent pancreatitis Active Problems:   Benign essential HTN   Pancreatic pseudocyst   Acute on chronic pancreatitis: Presented with severe epigastric pain .  Numerous admissions in the past for the same.  Started on bowel rest, IV fluids.  Currently on full liquid diet. He was last admitted for the same on 01/04/23.  On discharge, outpatient endoscopic ultrasound was a scheduled with [facility].  He says he has an appointment with [facility] on 3/18. Abdominal pain is better today.  He wants to advance diet to soft.No nausea or vomiting  Pancreatic pseudocyst:known finding.  Found to be around size of 12 X 17 mm.  Chronic finding.  Recommend to follow-up with gastroenterology.Dr Paulita Fujita as an outpatient  Hypertension: Currently blood pressure stable.  Continue amlodipine  Hypokalemia: Currently being monitored and supplemented as needed  Hyperlipidemia: LDL of 153.  We might consider Crestor on discharge.  History of alcohol use: Claims that he abstain from alcohol for almost 2 years now.        DVT prophylaxis:enoxaparin (LOVENOX) injection 40 mg Start: 02/12/23 2200     Code Status: Full Code  Family Communication: None at bedside  Patient status:Inpatient  Patient is from :Home  Anticipated discharge RC:[id number]  Estimated DC  date:tomorrow   Consultants:None  Procedures:None  Antimicrobials:  Anti-infectives (From admission, onward)    None       Subjective: Patient seen and examined at bedside today.  Comfortable lying on the bed. Admits  improvement in the abdominal pain.  Denies any nausea or vomiting  .  Wants to try soft diet today.  Objective: Vitals:   02/14/23 0526 02/14/23 1337 02/14/23 1940 02/15/23 0607  BP: 137/84 (!) 161/84 (!) 148/85 139/84  Pulse: (!) 58 (!) 54 67 87  Resp: '17 19 18 18  '$ Temp: 98.4 F (36.9 C) 98.2 F (36.8 C) 98.7 F (37.1 C) 98.9 F (37.2 C)  TempSrc:  Oral Oral Oral  SpO2: 98% 96% 92% 96%  Weight:      Height:       No intake or output data in the 24 hours ending 02/15/23 1140 Filed Weights   02/12/23 0824  Weight: 89.8 kg    Examination:  General exam: Overall comfortable, not in distress HEENT: PERRL Respiratory system:  no wheezes or crackles  Cardiovascular system: S1 & S2 heard, RRR.  Gastrointestinal system: Abdomen is mildly distended, soft and mostly nontender.Bowel sounds heard Central nervous system: Alert and oriented Extremities: No edema, no clubbing ,no cyanosis Skin: No rashes, no ulcers,no icterus     Data Reviewed: I have personally reviewed following labs and imaging studies  CBC: Recent Labs  Lab 02/12/23 0930 02/13/23 0353 02/15/23 0410  WBC 5.2 4.7 4.7  NEUTROABS 3.2  --  1.7  HGB 13.8 13.5 14.0  HCT 41.0 40.7 41.9  MCV 86.7 89.1 88.2  PLT 219 168 [id number]   Basic Metabolic Panel: Recent Labs  Lab 02/12/23 0930 02/13/23 0353 02/14/23  0404 02/15/23 0410  NA 135 139 137 138  K 3.7 3.6 3.3* 3.4*  CL 107 107 106 106  CO2 '23 24 24 26  '$ GLUCOSE 154* 113* 133* 97  BUN '16 9 7 6  '$ CREATININE 1.03 0.82 0.89 0.98  CALCIUM 8.6* 8.8* 8.7* 9.1     No results found for this or any previous visit (from the past 240 hour(s)).   Radiology Studies: No results found.  Scheduled Meds:  amLODipine  5 mg Oral Daily    enoxaparin (LOVENOX) injection  40 mg Subcutaneous Q24H   hydrALAZINE  100 mg Oral TID   potassium chloride  40 mEq Oral Daily   Continuous Infusions:   LOS: 3 days   Shelly Coss, MD [facility] P3/05/2023, 11:40 AM

## 2023-02-16 LAB — BASIC METABOLIC PANEL
Anion gap: 7 (ref 5–15)
BUN: 13 mg/dL (ref 6–20)
CO2: 25 mmol/L (ref 22–32)
Calcium: 9 mg/dL (ref 8.9–10.3)
Chloride: 106 mmol/L (ref 98–111)
Creatinine, Ser: 1.07 mg/dL (ref 0.61–1.24)
GFR, Estimated: >60 mL/min (ref >60)
Glucose, Bld: 100 mg/dL — ABNORMAL HIGH (ref 70–99)
Potassium: 3.6 mmol/L (ref 3.5–5.1)
Sodium: 138 mmol/L (ref 135–145)

## 2023-02-16 MED ORDER — OXYCODONE-ACETAMINOPHEN 5-325 MG PO TABS: 1.0000 | ORAL_TABLET | Freq: Four times a day (QID) | ORAL | 0 refills | Status: DC | PRN

## 2023-02-16 MED ORDER — POTASSIUM CHLORIDE CRYS ER 20 MEQ PO TBCR: 40.0000 meq | EXTENDED_RELEASE_TABLET | Freq: Every day | ORAL | 0 refills | Status: DC

## 2023-02-16 NOTE — Plan of Care (Signed)
Pt d/c'd home with wife. Problem: Education: Goal: Knowledge of General Education information will improve Description: Including pain rating scale, medication(s)/side effects and non-pharmacologic comfort measures Outcome: Adequate for Discharge   Problem: Health Behavior/Discharge Planning: Goal: Ability to manage health-related needs will improve Outcome: Adequate for Discharge   Problem: Clinical Measurements: Goal: Ability to maintain clinical measurements within normal limits will improve Outcome: Adequate for Discharge Goal: Will remain free from infection Outcome: Adequate for Discharge Goal: Diagnostic test results will improve Outcome: Adequate for Discharge Goal: Respiratory complications will improve Outcome: Adequate for Discharge Goal: Cardiovascular complication will be avoided Outcome: Adequate for Discharge   Problem: Activity: Goal: Risk for activity intolerance will decrease Outcome: Adequate for Discharge   Problem: Nutrition: Goal: Adequate nutrition will be maintained Outcome: Adequate for Discharge   Problem: Coping: Goal: Level of anxiety will decrease Outcome: Adequate for Discharge   Problem: Elimination: Goal: Will not experience complications related to bowel motility Outcome: Adequate for Discharge Goal: Will not experience complications related to urinary retention Outcome: Adequate for Discharge   Problem: Pain Managment: Goal: General experience of comfort will improve Outcome: Adequate for Discharge   Problem: Safety: Goal: Ability to remain free from injury will improve Outcome: Adequate for Discharge   Problem: Skin Integrity: Goal: Risk for impaired skin integrity will decrease Outcome: Adequate for Discharge

## 2023-02-16 NOTE — Plan of Care (Signed)

## 2023-02-16 NOTE — Discharge Summary (Signed)
Physician Discharge Summary  Max Ayers [id number] DOB: June 08, 1971 DOA: 02/12/2023  PCP: Willeen Niece, PA  Admit date: 02/12/2023 Discharge date: 02/16/2023  Admitted From: Home Disposition:  Home  Discharge Condition:Stable CODE STATUS:FULL Diet recommendation: Heart Healthy  Brief/Interim Summary: Patient is a 52 year old male with history of prior alcohol abuse, pancreatitis, pancreatic pseudocyst, numerous admissions for pancreatitis presented with severe epigastric pain.  CT abdomen/pelvis on admission showed interval improvement in peripancreatic and mesenteric fat stranding with residual pseudocyst/cystic lesion in the unclear process. Patient was started on management for pancreatitis.  Hospital course remarkable for severe persistent pain now improved.  Is tolerating soft diet since yesterday.  Patient has appointment with [facility] gastroenterology on 3/18, strongly encouraged to keep that appointment.  Medically stable for discharge home today.  Following problems were addressed during the hospitalization:  Acute on chronic pancreatitis: Presented with severe epigastric pain .  Numerous admissions in the past for the same.  Started on bowel rest, IV fluids.  Currently on full liquid diet. He was last admitted for the same on 01/04/23.  On discharge, outpatient endoscopic ultrasound was a scheduled with [facility].  He  he has an appointment with [facility] on 3/18. Abdominal pain is better today.  No nausea or vomiting.  Tolerating soft diet.  Abdomen is soft and nontender.   Pancreatic pseudocyst:known finding.  Found to be around size of 12 X 17 mm.  Chronic finding.  Recommend to follow-up with gastroenterology.Dr Paulita Fujita as an outpatient   Hypertension: Currently blood pressure stable.  Continue amlodipine, hydralazine   Hypokalemia: Supplemented  Want to avoid hyperlipidemia: LDL of 153.  We want to avoid statin for now due to history of pancreatitis.  We recommend him to  follow-up with PCP and discuss about treatment plan, repeat lipid panel in 3 months   History of alcohol use: Claims that he abstain from alcohol for almost 2 years now.   Discharge Diagnoses:  Principal Problem:   Acute recurrent pancreatitis Active Problems:   Benign essential HTN   Pancreatic pseudocyst    Discharge Instructions  Discharge Instructions     Diet - low sodium heart healthy   Complete by: As directed    Discharge instructions   Complete by: As directed    1) Please take prescribed medications as instructed 2)Follow up with gastroenterology on 3/18 3)Follow up with your PCP in 1 to 2 weeks.  Discuss with her PCP about management of your high cholesterol level.  Do a lipid panel testing 3 months.   Increase activity slowly   Complete by: As directed       Allergies as of 02/16/2023       Reactions   Yellow Jacket Venom [bee Venom] Anaphylaxis, Hives   Gadolinium Derivatives Nausea Only   2nd time in a row. Pt received MRI contrast and immediately experienced nausea.         Medication List     STOP taking these medications    labetalol 100 MG tablet Commonly known as: NORMODYNE       TAKE these medications    amLODipine 5 MG tablet Commonly known as: NORVASC Take 1 tablet (5 mg total) by mouth daily.   Clear Eyes Complete Soln Place 2 drops into both eyes as needed (for irritation).   hydrALAZINE 100 MG tablet Commonly known as: APRESOLINE Take 1 tablet (100 mg total) by mouth every 8 (eight) hours. What changed: when to take this   ondansetron 4 MG tablet Commonly  known as: Zofran Take 1 tablet (4 mg total) by mouth every 8 (eight) hours as needed for nausea or vomiting.   oxyCODONE-acetaminophen 5-325 MG tablet Commonly known as: PERCOCET/ROXICET Take 1 tablet by mouth every 6 (six) hours as needed for severe pain or moderate pain.   pantoprazole 40 MG tablet Commonly known as: PROTONIX Take 1 tablet (40 mg total) by mouth at  bedtime. What changed:  when to take this reasons to take this   polyethylene glycol 17 g packet Commonly known as: MIRALAX / GLYCOLAX Take 17 g by mouth daily. What changed:  when to take this reasons to take this   potassium chloride SA 20 MEQ tablet Commonly known as: KLOR-CON M Take 2 tablets (40 mEq total) by mouth daily for 5 days. Start taking on: February 17, 2023   TYLENOL 500 MG tablet Generic drug: acetaminophen Take 1,000 mg by mouth as needed for headache or mild pain.        Follow-up Information     [place], [place]. Schedule an appointment as soon as possible for a visit in 1 week(s).   Specialty: Physician Assistant Contact information: [address] [place] [address] [phone]                Allergies  Allergen Reactions   Yellow Jacket Venom [Bee Venom] Anaphylaxis and Hives   Gadolinium Derivatives Nausea Only    2nd time in a row. Pt received MRI contrast and immediately experienced nausea.     Consultations: None   Procedures/Studies: CT ABDOMEN PELVIS W CONTRAST  Result Date: 02/12/2023 CLINICAL DATA:  Epigastric pain radiating to the back. Previous history of pancreatitis EXAM: CT ABDOMEN AND PELVIS WITH CONTRAST TECHNIQUE: Multidetector CT imaging of the abdomen and pelvis was performed using the standard protocol following bolus administration of intravenous contrast. RADIATION DOSE REDUCTION: This exam was performed according to the departmental dose-optimization program which includes automated exposure control, adjustment of the mA and/or kV according to patient size and/or use of iterative reconstruction technique. CONTRAST:  127m OMNIPAQUE IOHEXOL 300 MG/ML  SOLN COMPARISON:  MRI 01/03/2023.  CT scan 12/30/2022 FINDINGS: Lower chest: There is some linear opacity lung bases likely scar or atelectasis. No pleural effusion. Hepatobiliary: No space-occupying liver lesion. Patent portal vein. Gallbladder  is nondilated. Pancreas: Once again there is severe pancreatic ductal dilatation in the midbody. There continues to be some peripancreatic fat stranding particularly towards the head and neck region but the level of stranding is less in the study of January 2024. In addition there is a cystic lesion identified in the uncinate process of the pancreas. Previously this measured 17 x 11 mm by CT and non MRI 17 x 13 mm. Today this area measures 17 by 12 mm. Pseudocysts. This does not clearly communicate with pancreatic duct on coronal imaging. No new fluid collection identified. Preserved pancreatic parenchymal enhancement. Spleen: Normal in size without focal abnormality. The splenic vein is intact. Adrenals/Urinary Tract: Adrenal glands are preserved. No enhancing renal mass or collecting system dilatation. The ureters have normal course and caliber extending down to the bladder. Preserved contours of the urinary bladder. Stomach/Bowel: Moderate colonic stool. Few scattered colonic diverticula are seen including along the right side of the colon. Normal retrocecal appendix. Stomach is mildly distended with fluid and some air. Vascular/Lymphatic: Normal caliber aorta and IVC. Mild atherosclerotic partially calcified plaque along the aorta and branch vessels. There are some small nodes identified in the retroperitoneum which are  not pathologic by size criteria. Similar along the mesentery Reproductive: Prostate is unremarkable. Other: No free air.  No frank ascites. Musculoskeletal: No acute or significant osseous findings. IMPRESSION: Interval improvement in the peripancreatic and mesenteric fat stranding with some residual. Persistent pancreatic ductal dilatation with a cystic lesion in the uncinate process measuring 17 x 12 mm. Possible pseudocyst. Preserved pancreatic parenchyma. Please correlate for any persistent clinical evidence of acute on subacute pancreatitis Scattered colonic stool with some diverticula.   Normal appendix. Electronically Signed   By: Jill Side M.D.   On: 02/12/2023 16:29   DG Chest 2 View  Result Date: 02/12/2023 CLINICAL DATA:  Epigastric pain. EXAM: CHEST - 2 VIEW COMPARISON:  None Available. FINDINGS: The cardiopericardial silhouette is within normal limits for size. The nodular density at the left base may be related to superimposition of soft tissue or double shadow The cardiopericardial silhouette is within normal limits for size. The visualized bony structures of the thorax are unremarkable. IMPRESSION: Nodular density at the left base may be related to superimposition of soft tissue or double shadow. Repeat frontal radiograph with nipple markers could be used to further evaluate. Alternatively CT chest without contrast could be performed as clinically warranted. Electronically Signed   By: Misty Stanley M.D.   On: 02/12/2023 09:54      Subjective: Patient seen and examined the bedside today.  Hemodynamically stable.  Abdominal pain well-controlled.  No nausea or vomiting.  Tolerating soft diet.  Eager to go home  Discharge Exam: Vitals:   02/15/23 2030 02/16/23 0652  BP: 136/81 139/82  Pulse: 78 (!) 58  Resp: 18 18  Temp: 99 F (37.2 C) 98 F (36.7 C)  SpO2: 95% 99%   Vitals:   02/15/23 0607 02/15/23 1500 02/15/23 2030 02/16/23 0652  BP: 139/84 (!) 142/83 136/81 139/82  Pulse: 87 76 78 (!) 58  Resp: '18 18 18 18  '$ Temp: 98.9 F (37.2 C) 98.8 F (37.1 C) 99 F (37.2 C) 98 F (36.7 C)  TempSrc: Oral Oral Oral Oral  SpO2: 96% 95% 95% 99%  Weight:      Height:        General: Pt is alert, awake, not in acute distress Cardiovascular: RRR, S1/S2 +, no rubs, no gallops Respiratory: CTA bilaterally, no wheezing, no rhonchi Abdominal: Soft, NT, ND, bowel sounds + Extremities: no edema, no cyanosis    The results of significant diagnostics from this hospitalization (including imaging, microbiology, ancillary and laboratory) are listed below for reference.      Microbiology: No results found for this or any previous visit (from the past 240 hour(s)).   Labs: BNP (last 3 results) No results for input(s): "BNP" in the last 8760 hours. Basic Metabolic Panel: Recent Labs  Lab 02/12/23 0930 02/13/23 0353 02/14/23 0404 02/15/23 0410 02/16/23 0416  NA 135 139 137 138 138  K 3.7 3.6 3.3* 3.4* 3.6  CL 107 107 106 106 106  CO2 '23 24 24 26 25  '$ GLUCOSE 154* 113* 133* 97 100*  BUN '16 9 7 6 13  '$ CREATININE 1.03 0.82 0.89 0.98 1.07  CALCIUM 8.6* 8.8* 8.7* 9.1 9.0   Liver Function Tests: Recent Labs  Lab 02/12/23 0930 02/14/23 0404  AST 20 15  ALT 14 12  ALKPHOS 62 57  BILITOT 0.7 0.4  PROT 7.2 6.5  ALBUMIN 3.7 3.4*   Recent Labs  Lab 02/12/23 0930  LIPASE 610*   No results for input(s): "AMMONIA" in the last 168  hours. CBC: Recent Labs  Lab 02/12/23 0930 02/13/23 0353 02/15/23 0410  WBC 5.2 4.7 4.7  NEUTROABS 3.2  --  1.7  HGB 13.8 13.5 14.0  HCT 41.0 40.7 41.9  MCV 86.7 89.1 88.2  PLT 219 168 212   Cardiac Enzymes: No results for input(s): "CKTOTAL", "CKMB", "CKMBINDEX", "TROPONINI" in the last 168 hours. BNP: Invalid input(s): "POCBNP" CBG: No results for input(s): "GLUCAP" in the last 168 hours. D-Dimer No results for input(s): "DDIMER" in the last 72 hours. Hgb A1c No results for input(s): "HGBA1C" in the last 72 hours. Lipid Profile No results for input(s): "CHOL", "HDL", "LDLCALC", "TRIG", "CHOLHDL", "LDLDIRECT" in the last 72 hours. Thyroid function studies No results for input(s): "TSH", "T4TOTAL", "T3FREE", "THYROIDAB" in the last 72 hours.  Invalid input(s): "FREET3" Anemia work up No results for input(s): "VITAMINB12", "FOLATE", "FERRITIN", "TIBC", "IRON", "RETICCTPCT" in the last 72 hours. Urinalysis    Component Value Date/Time   COLORURINE YELLOW 02/12/2023 1312   APPEARANCEUR CLEAR 02/12/2023 1312   LABSPEC 1.020 02/12/2023 1312   PHURINE 7.0 02/12/2023 1312   GLUCOSEU NEGATIVE 02/12/2023  1312   HGBUR NEGATIVE 02/12/2023 1312   BILIRUBINUR NEGATIVE 02/12/2023 1312   KETONESUR NEGATIVE 02/12/2023 1312   PROTEINUR NEGATIVE 02/12/2023 1312   UROBILINOGEN 0.2 10/27/2011 0957   NITRITE NEGATIVE 02/12/2023 1312   LEUKOCYTESUR NEGATIVE 02/12/2023 1312   Sepsis Labs Recent Labs  Lab 02/12/23 0930 02/13/23 0353 02/15/23 0410  WBC 5.2 4.7 4.7   Microbiology No results found for this or any previous visit (from the past 240 hour(s)).  Please note: You were cared for by a hospitalist during your hospital stay. Once you are discharged, your primary care physician will handle any further medical issues. Please note that NO REFILLS for any discharge medications will be authorized once you are discharged, as it is imperative that you return to your primary care physician (or establish a relationship with a primary care physician if you do not have one) for your post hospital discharge needs so that they can reassess your need for medications and monitor your lab values.    Time coordinating discharge: 40 minutes  SIGNED:   Shelly Coss, MD  [facility] 02/16/2023, 10:07 AM Pager [phone]  If 7PM-7AM, please contact night-coverage [email or web address] Password [email or web address]

## 2023-03-08 ENCOUNTER — Inpatient Hospital Stay (HOSPITAL_COMMUNITY)
Admission: EM | Admit: 2023-03-08 | Discharge: 2023-03-12 | DRG: 439 | Disposition: A | Discharge status: Home / Self Care | Payer: Medicaid Other | Attending: Family Medicine | Admitting: Family Medicine

## 2023-03-08 ENCOUNTER — Encounter (HOSPITAL_COMMUNITY): Payer: Self-pay

## 2023-03-08 ENCOUNTER — Other Ambulatory Visit: Payer: Self-pay

## 2023-03-08 ENCOUNTER — Emergency Department (HOSPITAL_COMMUNITY): Payer: Medicaid Other

## 2023-03-08 DIAGNOSIS — R1013 Epigastric pain: Secondary | ICD-10-CM | POA: Diagnosis present

## 2023-03-08 DIAGNOSIS — Z87891 Personal history of nicotine dependence: Secondary | ICD-10-CM

## 2023-03-08 DIAGNOSIS — E785 Hyperlipidemia, unspecified: Secondary | ICD-10-CM | POA: Diagnosis present

## 2023-03-08 DIAGNOSIS — Z8249 Family history of ischemic heart disease and other diseases of the circulatory system: Secondary | ICD-10-CM | POA: Diagnosis not present

## 2023-03-08 DIAGNOSIS — F1011 Alcohol abuse, in remission: Secondary | ICD-10-CM | POA: Diagnosis present

## 2023-03-08 DIAGNOSIS — K861 Other chronic pancreatitis: Secondary | ICD-10-CM | POA: Diagnosis present

## 2023-03-08 DIAGNOSIS — E876 Hypokalemia: Secondary | ICD-10-CM | POA: Diagnosis present

## 2023-03-08 DIAGNOSIS — I10 Essential (primary) hypertension: Secondary | ICD-10-CM | POA: Diagnosis present

## 2023-03-08 DIAGNOSIS — K863 Pseudocyst of pancreas: Secondary | ICD-10-CM | POA: Diagnosis present

## 2023-03-08 DIAGNOSIS — E668 Other obesity: Secondary | ICD-10-CM | POA: Diagnosis present

## 2023-03-08 DIAGNOSIS — Z91041 Radiographic dye allergy status: Secondary | ICD-10-CM

## 2023-03-08 DIAGNOSIS — Z91030 Bee allergy status: Secondary | ICD-10-CM

## 2023-03-08 DIAGNOSIS — Z6827 Body mass index (BMI) 27.0-27.9, adult: Secondary | ICD-10-CM | POA: Diagnosis not present

## 2023-03-08 DIAGNOSIS — Z79899 Other long term (current) drug therapy: Secondary | ICD-10-CM

## 2023-03-08 DIAGNOSIS — K859 Acute pancreatitis without necrosis or infection, unspecified: Secondary | ICD-10-CM | POA: Diagnosis not present

## 2023-03-08 LAB — COMPREHENSIVE METABOLIC PANEL
ALT: 16 U/L (ref 0–44)
AST: 16 U/L (ref 15–41)
Albumin: 4 g/dL (ref 3.5–5.0)
Alkaline Phosphatase: 55 U/L (ref 38–126)
Anion gap: 8 (ref 5–15)
BUN: 16 mg/dL (ref 6–20)
CO2: 25 mmol/L (ref 22–32)
Calcium: 8.8 mg/dL — ABNORMAL LOW (ref 8.9–10.3)
Chloride: 107 mmol/L (ref 98–111)
Creatinine, Ser: 0.93 mg/dL (ref 0.61–1.24)
GFR, Estimated: >60 mL/min (ref >60)
Glucose, Bld: 128 mg/dL — ABNORMAL HIGH (ref 70–99)
Potassium: 3.4 mmol/L — ABNORMAL LOW (ref 3.5–5.1)
Sodium: 140 mmol/L (ref 135–145)
Total Bilirubin: 0.4 mg/dL (ref 0.3–1.2)
Total Protein: 7.1 g/dL (ref 6.5–8.1)

## 2023-03-08 LAB — CBC WITH DIFFERENTIAL/PLATELET
Abs Immature Granulocytes: 0.01 10*3/uL (ref 0.00–0.07)
Basophils Absolute: 0 10*3/uL (ref 0.0–0.1)
Basophils Relative: 0 %
Eosinophils Absolute: 0.1 10*3/uL (ref 0.0–0.5)
Eosinophils Relative: 1 %
HCT: 39.9 % (ref 39.0–52.0)
Hemoglobin: 13.5 g/dL (ref 13.0–17.0)
Immature Granulocytes: 0 %
Lymphocytes Relative: 31 %
Lymphs Abs: 2 10*3/uL (ref 0.7–4.0)
MCH: 29.7 pg (ref 26.0–34.0)
MCHC: 33.8 g/dL (ref 30.0–36.0)
MCV: 87.7 fL (ref 80.0–100.0)
Monocytes Absolute: 0.4 10*3/uL (ref 0.1–1.0)
Monocytes Relative: 7 %
Neutro Abs: 3.9 10*3/uL (ref 1.7–7.7)
Neutrophils Relative %: 61 %
Platelets: 219 10*3/uL (ref 150–400)
RBC: 4.55 MIL/uL (ref 4.22–5.81)
RDW: 13.2 % (ref 11.5–15.5)
WBC: 6.4 10*3/uL (ref 4.0–10.5)
nRBC: 0 % (ref 0.0–0.2)

## 2023-03-08 LAB — TRIGLYCERIDES: Triglycerides: 129 mg/dL (ref <150)

## 2023-03-08 LAB — LIPASE, BLOOD: Lipase: 1608 U/L — ABNORMAL HIGH (ref 11–51)

## 2023-03-08 MED ORDER — HYDROMORPHONE HCL 1 MG/ML IJ SOLN
1.0000 mg | Freq: Once | INTRAMUSCULAR | Status: AC
  Administered 2023-03-08: 1 mg via INTRAVENOUS
  Filled 2023-03-08: qty 1

## 2023-03-08 MED ORDER — HYDROMORPHONE HCL 1 MG/ML IJ SOLN
1.0000 mg | INTRAMUSCULAR | Status: DC | PRN
  Administered 2023-03-09 – 2023-03-11 (×9): 1 mg via INTRAVENOUS
  Filled 2023-03-08 (×10): qty 1

## 2023-03-08 MED ORDER — ONDANSETRON HCL 4 MG/2ML IJ SOLN
4.0000 mg | Freq: Four times a day (QID) | INTRAMUSCULAR | Status: DC | PRN
  Administered 2023-03-09: 4 mg via INTRAVENOUS
  Filled 2023-03-08: qty 2

## 2023-03-08 MED ORDER — LACTATED RINGERS IV BOLUS
1000.0000 mL | Freq: Once | INTRAVENOUS | Status: AC
  Administered 2023-03-08: 1000 mL via INTRAVENOUS

## 2023-03-08 MED ORDER — ONDANSETRON HCL 4 MG/2ML IJ SOLN
4.0000 mg | Freq: Once | INTRAMUSCULAR | Status: AC
  Administered 2023-03-08: 4 mg via INTRAVENOUS
  Filled 2023-03-08: qty 2

## 2023-03-08 MED ORDER — IOHEXOL 300 MG/ML  SOLN
100.0000 mL | Freq: Once | INTRAMUSCULAR | Status: AC | PRN
  Administered 2023-03-08: 100 mL via INTRAVENOUS

## 2023-03-08 NOTE — H&P (Signed)
History and Physical    Max Ayers [id number] DOB: Nov 26, 1971 DOA: 03/08/2023  PCP: Willeen Niece, PA  Patient coming from: Home  I have personally briefly reviewed patient's old medical records in [place]  Chief Complaint: Abdominal pain  HPI: Max Ayers is a 52 y.o. male with medical history significant for recurrent pancreatitis, pancreatic pseudocyst, history of alcohol use in remission, HTN who presented to the ED for evaluation of abdominal pain.  Patient reports developing diaphoresis followed by epigastric abdominal pain this evening.  He has had associated nausea without emesis.  After a while pain became similar to his usual pancreatitis episodes therefore he called EMS and was brought to the ED for further evaluation.  He has not had any fevers, chills, dyspnea.  He says that he ate some baked fish earlier today.  He has followed up with [facility], Dr. Paulita Fujita, earlier this month.  They discussed further workup (EUS or ERCP?) however this cannot be done until patient is asymptomatic for at least 1 month.  Patient reports maintaining abstinence from alcohol use.  He does note taking Advil yesterday and today for shooting pains of his left leg.  ED Course  Labs/Imaging on admission: I have personally reviewed following labs and imaging studies.  Initial vitals showed BP 157/112, pulse 73, RR 19, temp 98.6 F, SpO2 99% on room air.  Labs show lipase 1608, triglycerides 129, sodium 140, potassium 3.4, bicarb 25, BUN 16, creatinine 0.93, serum glucose 128, LFTs within normal limits, WBC 6.4, hemoglobin 13.5, platelets 219,000.  CT abdomen/pelvis with contrast showed decreased inflammatory stranding surrounding the pancreatic head, unchanged low-attenuation area in the uncinate process which may represent a pseudocyst, no pancreatic ductal dilatation.  No other acute findings in the abdomen or pelvis.  Patient was given 1 L LR, IV Dilaudid 1 mg x 2, IV Zofran 4 mg.   The hospitalist service was consulted to admit for further evaluation and management.  Review of Systems: All systems reviewed and are negative except as documented in history of present illness above.   Past Medical History:  Diagnosis Date   Acute alcoholic pancreatitis 0000000   Hypertension    Pancreatitis    Tobacco use 12/12/2020    Past Surgical History:  Procedure Laterality Date   NO PAST SURGERIES      Social History:  reports that he has quit smoking. His smoking use included cigarettes and cigars. He smoked an average of 1 pack per day. He has never used smokeless tobacco. He reports that he does not currently use alcohol. He reports that he does not use drugs.  Allergies  Allergen Reactions   Yellow Jacket Venom [Bee Venom] Anaphylaxis and Hives   Gadolinium Derivatives Nausea Only    2nd time in a row. Pt received MRI contrast and immediately experienced nausea.     Family History  Problem Relation Age of Onset   Diabetes Mother    Hypertension Father    Breast cancer Maternal Grandmother    Colon cancer Neg Hx    Stomach cancer Neg Hx    Liver disease Neg Hx    Pancreatic cancer Neg Hx    Esophageal cancer Neg Hx    Rectal cancer Neg Hx      Prior to Admission medications   Medication Sig Start Date End Date Taking? Authorizing Provider  amLODipine (NORVASC) 5 MG tablet Take 1 tablet (5 mg total) by mouth daily. 01/05/23   Lavina Hamman, MD  hydrALAZINE (  APRESOLINE) 100 MG tablet Take 1 tablet (100 mg total) by mouth every 8 (eight) hours. Patient taking differently: Take 100 mg by mouth 3 (three) times daily. 11/22/22   Little Ishikawa, MD  Hyprom-Naphaz-Polysorb-Zn Sulf (CLEAR EYES COMPLETE) SOLN Place 2 drops into both eyes as needed (for irritation).    [provider]  ondansetron (ZOFRAN) 4 MG tablet Take 1 tablet (4 mg total) by mouth every 8 (eight) hours as needed for nausea or vomiting. 01/04/23 01/04/24  Lavina Hamman, MD   oxyCODONE-acetaminophen (PERCOCET/ROXICET) 5-325 MG tablet Take 1 tablet by mouth every 6 (six) hours as needed for severe pain or moderate pain. 02/16/23   Shelly Coss, MD  pantoprazole (PROTONIX) 40 MG tablet Take 1 tablet (40 mg total) by mouth at bedtime. Patient taking differently: Take 40 mg by mouth daily as needed (heartburn). 01/04/23 04/04/23  Lavina Hamman, MD  polyethylene glycol (MIRALAX / GLYCOLAX) 17 g packet Take 17 g by mouth daily. Patient taking differently: Take 17 g by mouth daily as needed for moderate constipation. 01/05/23   Lavina Hamman, MD  potassium chloride SA (KLOR-CON M) 20 MEQ tablet Take 2 tablets (40 mEq total) by mouth daily for 5 days. 02/17/23 02/22/23  Shelly Coss, MD  TYLENOL 500 MG tablet Take 1,000 mg by mouth as needed for headache or mild pain.    [provider]    Physical Exam: Vitals:   03/08/23 1901 03/08/23 2115 03/08/23 2306 03/09/23 0148  BP: (!) 157/112 (!) 151/91  (!) 182/83  Pulse: 73 (!) 58  (!) 53  Resp: 19 18  18   Temp: 98.6 F (37 C)  (!) 97.4 F (36.3 C) 97.9 F (36.6 C)  TempSrc: Oral  Oral Oral  SpO2: 99% 99%  99%  Weight:      Height:       Constitutional: Resting in bed, NAD, calm, comfortable Eyes: EOMI, lids and conjunctivae normal ENMT: Mucous membranes are moist. Posterior pharynx clear of any exudate or lesions.Normal dentition.  Neck: normal, supple, no masses. Respiratory: clear to auscultation bilaterally, no wheezing, no crackles. Normal respiratory effort. No accessory muscle use.  Cardiovascular: Regular rate and rhythm, no murmurs / rubs / gallops. No extremity edema. 2+ pedal pulses. Abdomen: Epigastric tenderness, no masses palpated.  Musculoskeletal: no clubbing / cyanosis. No joint deformity upper and lower extremities. Good ROM, no contractures. Normal muscle tone.  Skin: no rashes, lesions, ulcers. No induration Neurologic: Sensation intact. Strength 5/5 in all 4.  Psychiatric: Normal  judgment and insight. Alert and oriented x 3. Normal mood.   EKG: Personally reviewed. Sinus rhythm, rate 72, LVH, no acute ischemic use.  Similar to previous.  Assessment/Plan Principal Problem:   Acute recurrent pancreatitis Active Problems:   Benign essential HTN   History of alcohol abuse   Pancreatic pseudocyst   Hypokalemia   Max Ayers is a 52 y.o. male with medical history significant for recurrent pancreatitis, pancreatic pseudocyst, history of alcohol use in remission, HTN who is admitted with acute recurrent pancreatitis.  Assessment and Plan: * Acute recurrent pancreatitis He was admitted earlier this month with the same.  Lipase 1600 this admission.  CT imaging shows improvement in inflammatory stranding around the pancreatic head, stable low-attenuation area in the uncinate process which may represent a pseudocyst.  Triglycerides 129. -N.p.o. tonight -Continue IV fluid hydration -Continue antiemetics and analgesics as needed -RUQ ultrasound unremarkable  Pancreatic pseudocyst Stable findings on CT imaging.  History of alcohol abuse  Patient reports maintaining abstinence from alcohol use.  Benign essential HTN Continue amlodipine and hydralazine.  Hypokalemia IV supplement ordered.  DVT prophylaxis: enoxaparin (LOVENOX) injection 40 mg Start: 03/09/23 1000 Code Status: Full code, confirmed with patient on admission Family Communication: Discussed with patient, he has discussed with family Disposition Plan: From home and likely discharge to home pending clinical progress Consults called: None Severity of Illness: The appropriate patient status for this patient is INPATIENT. Inpatient status is judged to be reasonable and necessary in order to provide the required intensity of service to ensure the patient's safety. The patient's presenting symptoms, physical exam findings, and initial radiographic and laboratory data in the context of their chronic comorbidities  is felt to place them at high risk for further clinical deterioration. Furthermore, it is not anticipated that the patient will be medically stable for discharge from the hospital within 2 midnights of admission.   * I certify that at the point of admission it is my clinical judgment that the patient will require inpatient hospital care spanning beyond 2 midnights from the point of admission due to high intensity of service, high risk for further deterioration and high frequency of surveillance required.Zada Finders MD [facility]  If 7PM-7AM, please contact night-coverage [email or web address]  03/09/2023, 1:51 AM

## 2023-03-08 NOTE — ED Triage Notes (Signed)
PER EMS: pt is from home with c/o upper abd pain described as feeling similar to pancreatitis which he has had before. He reports nausea so EMS did adm 4mg  Zofran IV en route.   BP-166/100, HR-70, O2-98% RA 20g LAC

## 2023-03-08 NOTE — ED Provider Notes (Signed)
[place] EMERGENCY DEPARTMENT AT [facility] Provider Note   CSN: [id number] Arrival date & time: 03/08/23  [id number]     History  Chief Complaint  Patient presents with   Abdominal Pain    Kowen Chihuahua is a 52 y.o. male.  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  States it is consistent with previous episodes of pancreatitis.  Pain began approximately 2 hours prior to arrival after eating some food.  States it radiates to the mid upper back.  Endorses nausea but denies vomiting or fevers.  Denies chest pain or shortness of breath.  He denies recent alcohol use, changes to medications, scorpion stings.  Denies dysuria, frequency, urgency, changes to bowel habits.  He was given Zofran by EMS for his nausea and reports improvement in this.  States his last alcoholic drink was greater than 1 year ago.  He did follow-up with GI but reports that he was told he cannot have his procedure until he was symptom-free for 1 month.  Last visit for symptoms was on 02/12/2023.   Abdominal Pain Associated symptoms: nausea        Home Medications Prior to Admission medications   Medication Sig Start Date End Date Taking? Authorizing Provider  amLODipine (NORVASC) 5 MG tablet Take 1 tablet (5 mg total) by mouth daily. 01/05/23   Lavina Hamman, MD  hydrALAZINE (APRESOLINE) 100 MG tablet Take 1 tablet (100 mg total) by mouth every 8 (eight) hours. Patient taking differently: Take 100 mg by mouth 3 (three) times daily. 11/22/22   Little Ishikawa, MD  Hyprom-Naphaz-Polysorb-Zn Sulf (CLEAR EYES COMPLETE) SOLN Place 2 drops into both eyes as needed (for irritation).    [provider]  ondansetron (ZOFRAN) 4 MG tablet Take 1 tablet (4 mg total) by mouth every 8 (eight) hours as needed for nausea or vomiting. 01/04/23 01/04/24  Lavina Hamman, MD   oxyCODONE-acetaminophen (PERCOCET/ROXICET) 5-325 MG tablet Take 1 tablet by mouth every 6 (six) hours as needed for severe pain or moderate pain. 02/16/23   Shelly Coss, MD  pantoprazole (PROTONIX) 40 MG tablet Take 1 tablet (40 mg total) by mouth at bedtime. Patient taking differently: Take 40 mg by mouth daily as needed (heartburn). 01/04/23 04/04/23  Lavina Hamman, MD  polyethylene glycol (MIRALAX / GLYCOLAX) 17 g packet Take 17 g by mouth daily. Patient taking differently: Take 17 g by mouth daily as needed for moderate constipation. 01/05/23   Lavina Hamman, MD  potassium chloride SA (KLOR-CON M) 20 MEQ tablet Take 2 tablets (40 mEq total) by mouth daily for 5 days. 02/17/23 02/22/23  Shelly Coss, MD  TYLENOL 500 MG tablet Take 1,000 mg by mouth as needed for headache or mild pain.    [provider]      Allergies    Yellow jacket venom [bee venom] and Gadolinium derivatives    Review of Systems   Review of Systems  Gastrointestinal:  Positive for abdominal pain and nausea.  All other systems reviewed and are negative.   Physical Exam Updated Vital Signs BP (!) 151/91   Pulse (!) 58   Temp (!) 97.4 F (36.3 C) (Oral)   Resp 18   Ht 5\' 11"  (1.803 m)   Wt 90.7 kg   SpO2 99%   BMI 27.89 kg/m  Physical Exam Vitals and nursing note reviewed.  Constitutional:  General: He is in acute distress.     Appearance: He is well-developed. He is not ill-appearing, toxic-appearing or diaphoretic.  HENT:     Head: Normocephalic and atraumatic.  Eyes:     Conjunctiva/sclera: Conjunctivae normal.  Cardiovascular:     Rate and Rhythm: Normal rate and regular rhythm.     Heart sounds: No murmur heard. Pulmonary:     Effort: Pulmonary effort is normal. No respiratory distress.     Breath sounds: Normal breath sounds.  Abdominal:     Palpations: Abdomen is soft.     Tenderness: There is abdominal tenderness in the right upper quadrant and epigastric area. Negative  signs include Rovsing's sign, McBurney's sign, psoas sign and obturator sign.  Musculoskeletal:        General: No swelling.     Cervical back: Neck supple.  Skin:    General: Skin is warm and dry.     Capillary Refill: Capillary refill takes less than 2 seconds.  Neurological:     Mental Status: He is alert.  Psychiatric:        Mood and Affect: Mood normal.     ED Results / Procedures / Treatments   Labs (all labs ordered are listed, but only abnormal results are displayed) Labs Reviewed  COMPREHENSIVE METABOLIC PANEL - Abnormal; Notable for the following components:      Result Value   Potassium 3.4 (*)    Glucose, Bld 128 (*)    Calcium 8.8 (*)    All other components within normal limits  LIPASE, BLOOD - Abnormal; Notable for the following components:   Lipase 1,608 (*)    All other components within normal limits  CBC WITH DIFFERENTIAL/PLATELET  TRIGLYCERIDES    EKG EKG Interpretation  Date/Time:  Wednesday March 08 2023 19:45:44 EDT Ventricular Rate:  72 PR Interval:  157 QRS Duration: 91 QT Interval:  402 QTC Calculation: 440 R Axis:   35 Text Interpretation: Sinus rhythm Left ventricular hypertrophy Confirmed by Aletta Edouard [phone]) on 03/08/2023 7:50:08 PM  Radiology CT Abdomen Pelvis W Contrast  Result Date: 03/08/2023 CLINICAL DATA:  Acute pancreatitis, abdominal pain. EXAM: CT ABDOMEN AND PELVIS WITH CONTRAST TECHNIQUE: Multidetector CT imaging of the abdomen and pelvis was performed using the standard protocol following bolus administration of intravenous contrast. RADIATION DOSE REDUCTION: This exam was performed according to the departmental dose-optimization program which includes automated exposure control, adjustment of the mA and/or kV according to patient size and/or use of iterative reconstruction technique. CONTRAST:  136mL OMNIPAQUE IOHEXOL 300 MG/ML  SOLN COMPARISON:  CT abdomen and pelvis 02/12/2023 FINDINGS: Lower chest: No acute abnormality.  Hepatobiliary: No focal liver abnormality is seen. No gallstones, gallbladder wall thickening, or biliary dilatation. Pancreas: Inflammatory stranding surrounding the pancreatic head has decreased. Low-attenuation area in the uncinate process measures 1.8 x 1.0 cm and appears similar to prior. No pancreatic ductal dilatation. Spleen: Normal in size without focal abnormality. Adrenals/Urinary Tract: Adrenal glands are unremarkable. Kidneys are normal, without renal calculi, focal lesion, or hydronephrosis. Bladder is unremarkable. Stomach/Bowel: Stomach is within normal limits. Appendix appears normal. No evidence of bowel wall thickening, distention, or inflammatory changes. Vascular/Lymphatic: Aortic atherosclerosis. No enlarged abdominal or pelvic lymph nodes. Reproductive: Prostate is unremarkable. Other: No abdominal wall hernia or abnormality. No abdominopelvic ascites. Musculoskeletal: No acute or significant osseous findings. IMPRESSION: 1. Decreasing inflammatory stranding surrounding the pancreatic head. 2. Unchanged low-attenuation area in the uncinate process may represent a pseudocyst. 3. No other acute findings in the  abdomen or pelvis. Aortic Atherosclerosis (ICD10-I70.0). Electronically Signed   By: Ronney Asters M.D.   On: 03/08/2023 21:52    Procedures Procedures    Medications Ordered in ED Medications  ondansetron (ZOFRAN) injection 4 mg (has no administration in time range)  HYDROmorphone (DILAUDID) injection 1 mg (has no administration in time range)  lactated ringers bolus 1,000 mL (1,000 mLs Intravenous Bolus 03/08/23 2052)  HYDROmorphone (DILAUDID) injection 1 mg (1 mg Intravenous Given 03/08/23 2051)  ondansetron (ZOFRAN) injection 4 mg (4 mg Intravenous Given 03/08/23 2051)  iohexol (OMNIPAQUE) 300 MG/ML solution 100 mL (100 mLs Intravenous Contrast Given 03/08/23 2128)  HYDROmorphone (DILAUDID) injection 1 mg (1 mg Intravenous Given 03/08/23 2305)    ED Course/ Medical Decision  Making/ A&P Clinical Course as of 03/08/23 2356  Wed Mar 08, 2023  2319 On reevaluation after 2 rounds of Dilaudid patient's pain is still a 7 out of 10.  Will order additional bolus of LR and admit for acute on chronic pancreatitis with intractable pain [AS]  2335 Spoke with hospitalist Dr. Posey Pronto who will admit [AS]    Clinical Course User Index [AS] Adolpho Meenach, [place]on Folk, PA-C                             Medical Decision Making Amount and/or Complexity of Data Reviewed Labs: ordered. Radiology: ordered.  Risk Prescription drug management. Decision regarding hospitalization.  This patient presents to the ED for concern of epigastric abdominal pain, this involves an extensive number of treatment options, and is a complaint that carries with it a high risk of complications and morbidity. Differential diagnosis of epigastric pain includes: Functional or nonulcer dyspepsia, PUD, GERD, Gastritis, (NSAIDs, alcohol, stress, H. pylori, pernicious anemia), pancreatitis or pancreatic cancer, overeating indigestion (high-fat foods, coffee), drugs (aspirin, antibiotics (eg, macrolides, metronidazole), corticosteroids, digoxin, narcotics, theophylline), gastroparesis, lactose intolerance, malabsorption, gastric cancer, parasitic infection, (Giardia, Strongyloides, Ascaris) cholelithiasis, choledocholithiasis, or cholangitis, ACS, pericarditis, pneumonia, abdominal hernia, pregnancy, intestinal ischemia, esophageal rupture, gastric volvulus, hepatitis.   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 Previous records within EMR system ED visits for similar on 12/30/2022, 02/12/2023 EMS provides a portion of the history  I ordered, reviewed and interpreted labs which  include: CBC, CMP, lipase, triglycerides.  Hypokalemia of 3.4, hyperglycemia of 128.  Elevated lipase to 1608  I ordered imaging studies including CT abdomen pelvis I independently visualized and interpreted imaging which showed decreased inflammatory stranding surrounding the pancreatic head, unchanged pseudocyst I agree with the radiologist interpretation  Consultations Obtained:  I requested consultation with the hospitalist Dr. Posey Pronto,  and discussed lab and imaging findings as well as pertinent plan - they recommend: Admission  Afebrile, hypertensive but otherwise hemodynamically stable.  51 year old male presents ED for evaluation of sudden onset epigastric pain.  Does have a history of chronic pancreatitis.  He denies recent alcohol use.  He is in acute distress on exam with significant right upper quadrant and epigastric abdominal TTP.  Lab workup with significant elevation of lipase when compared to previous.  CT abdomen pelvis reassuring today, however he is still in the acute phase of his symptoms.  He did receive 2 rounds of Dilaudid and was continuing to report significant abdominal pain rating at a 7 out of 10.  He is without nausea at the time of admission.  He will be admitted for intractable pain and recurrent severe pancreatitis.  Would benefit from continued analgesia and IV fluids.  Stable at the time of admission.  Patient's case discussed with Dr. Melina Copa who agrees with plan to admit.   Note: Portions of this report may have been transcribed using voice recognition software. Every effort was made to ensure accuracy; however, inadvertent computerized transcription errors may still be present.        Final Clinical Impression(s) / ED Diagnoses Final diagnoses:  Acute recurrent pancreatitis    Rx / DC Orders ED Discharge Orders     None         Nehemiah Massed 03/08/23 2356    Hayden Rasmussen, MD 03/09/23 1052

## 2023-03-08 NOTE — ED Provider Triage Note (Signed)
Emergency Medicine Provider Triage Evaluation Note  Max Ayers , a 52 y.o. male  was evaluated in triage.  Pt complains of epigastric abdominal pain.  States it feels like his chronic pancreatitis.  Began at approximately 5 PM.  Has radiation to the mid back.  Was given Zofran by EMS and reports improvement in his nausea.  Still has pain.  Denies recent alcohol use or scorpion stings.  Review of Systems  Positive: As above Negative: As above  Physical Exam  BP (!) 157/112 (BP Location: Left Arm)   Pulse 73   Temp 98.6 F (37 C) (Oral)   Resp 19   Ht 5\' 11"  (1.803 m)   Wt 90.7 kg   SpO2 99%   BMI 27.89 kg/m  Gen:   Awake, moderate distress   Resp:  Normal effort  MSK:   Moves extremities without difficulty  Other:    Medical Decision Making  Medically screening exam initiated at 7:12 PM.  Appropriate orders placed.  Curby Bady was informed that the remainder of the evaluation will be completed by another provider, this initial triage assessment does not replace that evaluation, and the importance of remaining in the ED until their evaluation is complete.  Labs ordered   Nehemiah Massed 03/08/23 1913

## 2023-03-09 ENCOUNTER — Inpatient Hospital Stay (HOSPITAL_COMMUNITY): Payer: Medicaid Other

## 2023-03-09 ENCOUNTER — Other Ambulatory Visit: Payer: Self-pay

## 2023-03-09 DIAGNOSIS — K859 Acute pancreatitis without necrosis or infection, unspecified: Secondary | ICD-10-CM | POA: Diagnosis not present

## 2023-03-09 LAB — COMPREHENSIVE METABOLIC PANEL
ALT: 16 U/L (ref 0–44)
AST: 14 U/L — ABNORMAL LOW (ref 15–41)
Albumin: 4.1 g/dL (ref 3.5–5.0)
Alkaline Phosphatase: 57 U/L (ref 38–126)
Anion gap: 7 (ref 5–15)
BUN: 13 mg/dL (ref 6–20)
CO2: 28 mmol/L (ref 22–32)
Calcium: 8.9 mg/dL (ref 8.9–10.3)
Chloride: 105 mmol/L (ref 98–111)
Creatinine, Ser: 0.86 mg/dL (ref 0.61–1.24)
GFR, Estimated: >60 mL/min (ref >60)
Glucose, Bld: 104 mg/dL — ABNORMAL HIGH (ref 70–99)
Potassium: 3.7 mmol/L (ref 3.5–5.1)
Sodium: 140 mmol/L (ref 135–145)
Total Bilirubin: 0.5 mg/dL (ref 0.3–1.2)
Total Protein: 6.9 g/dL (ref 6.5–8.1)

## 2023-03-09 LAB — CBC
HCT: 40.3 % (ref 39.0–52.0)
Hemoglobin: 13.3 g/dL (ref 13.0–17.0)
MCH: 29.5 pg (ref 26.0–34.0)
MCHC: 33 g/dL (ref 30.0–36.0)
MCV: 89.4 fL (ref 80.0–100.0)
Platelets: 205 10*3/uL (ref 150–400)
RBC: 4.51 MIL/uL (ref 4.22–5.81)
RDW: 13.3 % (ref 11.5–15.5)
WBC: 6.8 10*3/uL (ref 4.0–10.5)
nRBC: 0 % (ref 0.0–0.2)

## 2023-03-09 MED ORDER — PANTOPRAZOLE SODIUM 40 MG PO TBEC: 40.0000 mg | DELAYED_RELEASE_TABLET | Freq: Every day | ORAL | Status: DC | PRN

## 2023-03-09 MED ORDER — SENNOSIDES-DOCUSATE SODIUM 8.6-50 MG PO TABS: 1.0000 | ORAL_TABLET | Freq: Every evening | ORAL | Status: DC | PRN

## 2023-03-09 MED ORDER — HYDRALAZINE HCL 50 MG PO TABS
100.0000 mg | ORAL_TABLET | Freq: Three times a day (TID) | ORAL | Status: DC
  Administered 2023-03-09 – 2023-03-12 (×10): 100 mg via ORAL
  Filled 2023-03-09 (×10): qty 2

## 2023-03-09 MED ORDER — AMLODIPINE BESYLATE 5 MG PO TABS
5.0000 mg | ORAL_TABLET | Freq: Every day | ORAL | Status: DC
  Administered 2023-03-09 – 2023-03-10 (×2): 5 mg via ORAL
  Filled 2023-03-09 (×2): qty 1

## 2023-03-09 MED ORDER — POTASSIUM CHLORIDE 10 MEQ/100ML IV SOLN
10.0000 meq | INTRAVENOUS | Status: AC
  Administered 2023-03-09 (×2): 10 meq via INTRAVENOUS
  Filled 2023-03-09 (×2): qty 100

## 2023-03-09 MED ORDER — OXYCODONE HCL 5 MG PO TABS
5.0000 mg | ORAL_TABLET | ORAL | Status: DC | PRN
  Administered 2023-03-10 (×3): 5 mg via ORAL
  Filled 2023-03-09 (×3): qty 1

## 2023-03-09 MED ORDER — ENOXAPARIN SODIUM 40 MG/0.4ML IJ SOSY
40.0000 mg | PREFILLED_SYRINGE | INTRAMUSCULAR | Status: DC
  Administered 2023-03-09 – 2023-03-12 (×4): 40 mg via SUBCUTANEOUS
  Filled 2023-03-09 (×4): qty 0.4

## 2023-03-09 MED ORDER — ACETAMINOPHEN 650 MG RE SUPP: 650.0000 mg | Freq: Four times a day (QID) | RECTAL | Status: DC | PRN

## 2023-03-09 MED ORDER — ACETAMINOPHEN 325 MG PO TABS: 650.0000 mg | ORAL_TABLET | Freq: Four times a day (QID) | ORAL | Status: DC | PRN

## 2023-03-09 MED ORDER — POTASSIUM CHLORIDE 2 MEQ/ML IV SOLN
INTRAVENOUS | Status: AC
  Filled 2023-03-09 (×2): qty 1000

## 2023-03-09 NOTE — Assessment & Plan Note (Signed)
Stable findings on CT imaging.

## 2023-03-09 NOTE — Assessment & Plan Note (Signed)
Continue amlodipine and hydralazine. 

## 2023-03-09 NOTE — Assessment & Plan Note (Signed)
Patient reports maintaining abstinence from alcohol use.

## 2023-03-09 NOTE — Assessment & Plan Note (Signed)
He was admitted earlier this month with the same.  Lipase 1600 this admission.  CT imaging shows improvement in inflammatory stranding around the pancreatic head, stable low-attenuation area in the uncinate process which may represent a pseudocyst.  Triglycerides 129. -N.p.o. tonight -Continue IV fluid hydration -Continue antiemetics and analgesics as needed -RUQ ultrasound unremarkable

## 2023-03-09 NOTE — Assessment & Plan Note (Signed)
IV supplement ordered. 

## 2023-03-09 NOTE — TOC CM/SW Note (Signed)
Transition of Care [facility]) Screening Note  Patient Details  Name: Max Ayers Date of Birth: 10-05-1971  Transition of Care [facility]) CM/SW Contact:    Sherie Don, LCSW Phone Number: 03/09/2023, 10:25 AM  Transition of Care Department [facility]) has reviewed patient and no TOC needs have been identified at this time. We will continue to monitor patient advancement through interdisciplinary progression rounds. If new patient transition needs arise, please place a TOC consult.

## 2023-03-09 NOTE — Progress Notes (Signed)
PROGRESS NOTE   Max Ayers  [id number] DOB: 10-28-1971 DOA: 03/08/2023 PCP: Willeen Niece, PA  Brief Narrative:  52 yo male with PMHx recurrent pancreatitis, pancreatic pseudocyst, history of alcohol use in remission, hypertension who initially presented to the emergency room for evaluation of abdominal pain on 03/08/2023.  Of note, he follows with [facility]-Dr. Paulita Fujita.  Documentation reveals possibility of further workup but cannot be performed until he is asymptomatic for 1 month. Abstaining from alcohol use.   CT abdomen/pelvis with contrast showed decreased inflammatory stranding surrounding the pancreatic head, unchanged low-attenuation area in the uncinate process which may represent a pseudocyst, no pancreatic ductal dilatation. No other acute findings in the abdomen or pelvis.   ED labs revealed lipase 1608, triglycerides 129, sodium 140, potassium 3.4, bicarb 25, BUN 16. Cr 0.93  Hospital-Problem based course  Acute recurrent pancreatitis  Pancreatic pseudocyst, stable on CT Triglycerides 129, abstaining from alcohol use, Lipase >1600. Continues to experience pain, no advance of diet as of yet.  -- Reassess pain in AM  - Antiemetics and analgesics  -Dilaudid 1 mg q3PRN, Tylenol, Zofran, PPI  -May benefit from addition of oxycodone low dose  - Continue IV fluid hydration  -LR 125 + K 18mEq - Continue n.p.o., advance diet as tolerated - Will need outpatient GI follow up  -- AM CBC, CMP   History of alcohol abuse Patient reports maintaining abstinence from alcohol use. Monitoring, no signs of withdrawal    Benign essential HTN Continue amlodipine and hydralazine. 150s/80s today.    Hypokalemia, resolved  Assess for lyte derangement with routine labs  -- AM CBC, CMP    DVT prophylaxis: Lovenox  Code Status: FULL Family Communication:  Disposition:  Status is: Inpatient Remains inpatient appropriate because: Requires routine monitoring and improvement of status.     Consultants:  None   Procedures: None   Antimicrobials: None     Subjective: Patient reports that he has had 8/10 abdominal pain and has not felt like drinking or eating. No dyspnea or chest pain. He notes that he has had pancreatitis before, follows with GI.   Objective: Vitals:   03/08/23 2306 03/09/23 0148 03/09/23 0527 03/09/23 0911  BP:  (!) 182/83 (!) 147/89 (!) 155/80  Pulse:  (!) 53 83 (!) 52  Resp:  18 16 16   Temp: (!) 97.4 F (36.3 C) 97.9 F (36.6 C) 97.9 F (36.6 C) 97.7 F (36.5 C)  TempSrc: Oral Oral Oral   SpO2:  99% 96% 96%  Weight:      Height:        Intake/Output Summary (Last 24 hours) at 03/09/2023 1233 Last data filed at 03/09/2023 [id number] Gross per 24 hour  Intake 565.03 ml  Output --  Net 565.03 ml   Filed Weights   03/08/23 1900  Weight: 90.7 kg    Examination:  General: Alert and oriented in no apparent distress; pleasant AAM  Heart: Regular rate and rhythm with no murmurs appreciated Lungs: CTA bilaterally, no wheezing Abdomen: Bowel sounds present, guarding diffusely while diffusely TTP, slightly distended, no rebound tenderness  Skin: Warm and dry Extremities: No lower extremity edema   Data Reviewed: personally reviewed   CBC    Component Value Date/Time   WBC 6.8 03/09/2023 0403   RBC 4.51 03/09/2023 0403   HGB 13.3 03/09/2023 0403   HCT 40.3 03/09/2023 0403   PLT 205 03/09/2023 0403   MCV 89.4 03/09/2023 0403   MCH 29.5 03/09/2023 0403   MCHC 33.0  03/09/2023 0403   RDW 13.3 03/09/2023 0403   LYMPHSABS 2.0 03/08/2023 1932   MONOABS 0.4 03/08/2023 1932   EOSABS 0.1 03/08/2023 1932   BASOSABS 0.0 03/08/2023 1932      Latest Ref Rng & Units 03/09/2023    4:03 AM 03/08/2023    7:32 PM 02/16/2023    4:16 AM  CMP  Glucose 70 - 99 mg/dL 104  128  100   BUN 6 - 20 mg/dL 13  16  13    Creatinine 0.61 - 1.24 mg/dL 0.86  0.93  1.07   Sodium 135 - 145 mmol/L 140  140  138   Potassium 3.5 - 5.1 mmol/L 3.7  3.4  3.6    Chloride 98 - 111 mmol/L 105  107  106   CO2 22 - 32 mmol/L 28  25  25    Calcium 8.9 - 10.3 mg/dL 8.9  8.8  9.0   Total Protein 6.5 - 8.1 g/dL 6.9  7.1    Total Bilirubin 0.3 - 1.2 mg/dL 0.5  0.4    Alkaline Phos 38 - 126 U/L 57  55    AST 15 - 41 U/L 14  16    ALT 0 - 44 U/L 16  16       Radiology Studies: US Abdomen Limited RUQ (LIVER/GB)  Result Date: 03/09/2023 CLINICAL DATA:  [id number] Acute recurrent pancreatitis [id number] EXAM: ULTRASOUND ABDOMEN LIMITED RIGHT UPPER QUADRANT COMPARISON:  CT abdomen pelvis 03/08/2023 FINDINGS: Gallbladder: No gallstones or wall thickening visualized. No sonographic Murphy sign noted by sonographer; however, per sonographer the patient is pre-medicated. Common bile duct: Diameter: 5 mm Liver: No focal lesion identified. Within normal limits in parenchymal echogenicity. Portal vein is patent on color Doppler imaging with normal direction of blood flow towards the liver. Other: None. IMPRESSION: Unremarkable right upper quadrant ultrasound. Electronically Signed   By: Iven Finn M.D.   On: 03/09/2023 01:39   CT Abdomen Pelvis W Contrast  Result Date: 03/08/2023 CLINICAL DATA:  Acute pancreatitis, abdominal pain. EXAM: CT ABDOMEN AND PELVIS WITH CONTRAST TECHNIQUE: Multidetector CT imaging of the abdomen and pelvis was performed using the standard protocol following bolus administration of intravenous contrast. RADIATION DOSE REDUCTION: This exam was performed according to the departmental dose-optimization program which includes automated exposure control, adjustment of the mA and/or kV according to patient size and/or use of iterative reconstruction technique. CONTRAST:  156mL OMNIPAQUE IOHEXOL 300 MG/ML  SOLN COMPARISON:  CT abdomen and pelvis 02/12/2023 FINDINGS: Lower chest: No acute abnormality. Hepatobiliary: No focal liver abnormality is seen. No gallstones, gallbladder wall thickening, or biliary dilatation. Pancreas: Inflammatory stranding surrounding  the pancreatic head has decreased. Low-attenuation area in the uncinate process measures 1.8 x 1.0 cm and appears similar to prior. No pancreatic ductal dilatation. Spleen: Normal in size without focal abnormality. Adrenals/Urinary Tract: Adrenal glands are unremarkable. Kidneys are normal, without renal calculi, focal lesion, or hydronephrosis. Bladder is unremarkable. Stomach/Bowel: Stomach is within normal limits. Appendix appears normal. No evidence of bowel wall thickening, distention, or inflammatory changes. Vascular/Lymphatic: Aortic atherosclerosis. No enlarged abdominal or pelvic lymph nodes. Reproductive: Prostate is unremarkable. Other: No abdominal wall hernia or abnormality. No abdominopelvic ascites. Musculoskeletal: No acute or significant osseous findings. IMPRESSION: 1. Decreasing inflammatory stranding surrounding the pancreatic head. 2. Unchanged low-attenuation area in the uncinate process may represent a pseudocyst. 3. No other acute findings in the abdomen or pelvis. Aortic Atherosclerosis (ICD10-I70.0). Electronically Signed   By: Ronney Asters M.D.   On:  03/08/2023 21:52     Scheduled Meds:  amLODipine  5 mg Oral Daily   enoxaparin (LOVENOX) injection  40 mg Subcutaneous Q24H   hydrALAZINE  100 mg Oral TID   Continuous Infusions:  lactated ringers 1,000 mL with potassium chloride 20 mEq infusion 125 mL/hr at 03/09/23 0437     LOS: 1 day   Time spent: [place], MD [facility]  03/09/2023, 12:33 PM

## 2023-03-10 DIAGNOSIS — K859 Acute pancreatitis without necrosis or infection, unspecified: Secondary | ICD-10-CM | POA: Diagnosis not present

## 2023-03-10 LAB — COMPREHENSIVE METABOLIC PANEL
ALT: 14 U/L (ref 0–44)
AST: 16 U/L (ref 15–41)
Albumin: 3.6 g/dL (ref 3.5–5.0)
Alkaline Phosphatase: 60 U/L (ref 38–126)
Anion gap: 8 (ref 5–15)
BUN: 12 mg/dL (ref 6–20)
CO2: 25 mmol/L (ref 22–32)
Calcium: 8.3 mg/dL — ABNORMAL LOW (ref 8.9–10.3)
Chloride: 101 mmol/L (ref 98–111)
Creatinine, Ser: 0.82 mg/dL (ref 0.61–1.24)
GFR, Estimated: >60 mL/min (ref >60)
Glucose, Bld: 145 mg/dL — ABNORMAL HIGH (ref 70–99)
Potassium: 3 mmol/L — ABNORMAL LOW (ref 3.5–5.1)
Sodium: 134 mmol/L — ABNORMAL LOW (ref 135–145)
Total Bilirubin: 0.5 mg/dL (ref 0.3–1.2)
Total Protein: 7 g/dL (ref 6.5–8.1)

## 2023-03-10 LAB — CBC
HCT: 40.4 % (ref 39.0–52.0)
Hemoglobin: 13.3 g/dL (ref 13.0–17.0)
MCH: 29.3 pg (ref 26.0–34.0)
MCHC: 32.9 g/dL (ref 30.0–36.0)
MCV: 89 fL (ref 80.0–100.0)
Platelets: 209 10*3/uL (ref 150–400)
RBC: 4.54 MIL/uL (ref 4.22–5.81)
RDW: 13.2 % (ref 11.5–15.5)
WBC: 5 10*3/uL (ref 4.0–10.5)
nRBC: 0 % (ref 0.0–0.2)

## 2023-03-10 LAB — MAGNESIUM: Magnesium: 2.1 mg/dL (ref 1.7–2.4)

## 2023-03-10 MED ORDER — POTASSIUM CHLORIDE CRYS ER 20 MEQ PO TBCR
40.0000 meq | EXTENDED_RELEASE_TABLET | Freq: Two times a day (BID) | ORAL | Status: DC
  Administered 2023-03-10 – 2023-03-12 (×5): 40 meq via ORAL
  Filled 2023-03-10 (×5): qty 2

## 2023-03-10 MED ORDER — OXYCODONE-ACETAMINOPHEN 5-325 MG PO TABS
1.0000 | ORAL_TABLET | ORAL | Status: DC | PRN
  Administered 2023-03-10 – 2023-03-12 (×8): 1 via ORAL
  Filled 2023-03-10 (×8): qty 1

## 2023-03-10 NOTE — Progress Notes (Signed)
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 Paulita Fujita on 3/18 and told that he will need a month from last admission to do an endoscopic drainage of some sort--- this obviously set him back  CT abd/pelv decreased inflammatory stranding surrounding the pancreatic head, unchanged low-attenuation area in the uncinate process which may represent a pseudocyst, no pancreatic ductal dilatation. No other acute findings in the abdomen or pelvis.   ED labs revealed lipase 1608, triglycerides 129, sodium 140, potassium 3.4, bicarb 25, BUN 16. 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 Paulita Fujita once he is discharged to ensure he is followed  HTN Slightly ucontrolled secondary to continue amlodipine 5 hydralazine 100 3 times daily If blood pressure remains high in the a.m., I may have to increase his amlodipine  Mild hypokalemia Given K-Dur 40 twice daily check a.m. magnesium  Mild obesity BMI 27 Outpatient  follow-up  DVT prophylaxis: Lovenox  Code Status: FULL Family Communication:  Disposition:  Status is: Inpatient Remains inpatient appropriate because: Requires routine monitoring and improvement of status.    Consultants:  None   Procedures: None   Antimicrobials: None     Subjective:  Some discomfort in the abdomen after eating fish revisited him at lunchtime and although there is report of him vomiting last night but nursing does not corroborate this He is having no chest pain or fever  Objective: Vitals:   03/09/23 1357 03/09/23 1934 03/10/23 0602 03/10/23 1230  BP: (!) 177/92 (!) 160/93 (!) 151/90 (!) 165/89  Pulse: (!) 58 70 83 89  Resp: 16 20 18 18   Temp: 98.2 F (36.8 C) 98.3 F (36.8 C) 98.9 F (37.2 C) 98.1 F (36.7 C)  TempSrc:   Oral Oral  SpO2: 96% 93% 96% 97%  Weight:      Height:        Intake/Output Summary (Last 24 hours) at 03/10/2023 1744 Last data filed at 03/10/2023 1300 Gross per 24 hour  Intake 220 ml  Output --  Net 220 ml    Filed Weights   03/08/23 1900  Weight: 90.7 kg    Examination:  EOMI NCAT no focal deficit No icterus pallor Looks to be in some abdominal distress No lower extremity edema Neuro intact   Data Reviewed: personally reviewed   CBC    Component Value Date/Time   WBC 5.0 03/10/2023 0343   RBC 4.54 03/10/2023 0343   HGB 13.3 03/10/2023 0343   HCT  40.4 03/10/2023 0343   PLT 209 03/10/2023 0343   MCV 89.0 03/10/2023 0343   MCH 29.3 03/10/2023 0343   MCHC 32.9 03/10/2023 0343   RDW 13.2 03/10/2023 0343   LYMPHSABS 2.0 03/08/2023 1932   MONOABS 0.4 03/08/2023 1932   EOSABS 0.1 03/08/2023 1932   BASOSABS 0.0 03/08/2023 1932      Latest Ref Rng & Units 03/10/2023    3:43 AM 03/09/2023    4:03 AM 03/08/2023    7:32 PM  CMP  Glucose 70 - 99 mg/dL 145  104  128   BUN 6 - 20 mg/dL 12  13  16    Creatinine 0.61 - 1.24 mg/dL 0.82  0.86  0.93   Sodium 135 - 145 mmol/L 134  140  140   Potassium 3.5 - 5.1  mmol/L 3.0  3.7  3.4   Chloride 98 - 111 mmol/L 101  105  107   CO2 22 - 32 mmol/L 25  28  25    Calcium 8.9 - 10.3 mg/dL 8.3  8.9  8.8   Total Protein 6.5 - 8.1 g/dL 7.0  6.9  7.1   Total Bilirubin 0.3 - 1.2 mg/dL 0.5  0.5  0.4   Alkaline Phos 38 - 126 U/L 60  57  55   AST 15 - 41 U/L 16  14  16    ALT 0 - 44 U/L 14  16  16       Radiology Studies: US Abdomen Limited RUQ (LIVER/GB)  Result Date: 03/09/2023 CLINICAL DATA:  [id number] Acute recurrent pancreatitis [id number] EXAM: ULTRASOUND ABDOMEN LIMITED RIGHT UPPER QUADRANT COMPARISON:  CT abdomen pelvis 03/08/2023 FINDINGS: Gallbladder: No gallstones or wall thickening visualized. No sonographic Murphy sign noted by sonographer; however, per sonographer the patient is pre-medicated. Common bile duct: Diameter: 5 mm Liver: No focal lesion identified. Within normal limits in parenchymal echogenicity. Portal vein is patent on color Doppler imaging with normal direction of blood flow towards the liver. Other: None. IMPRESSION: Unremarkable right upper quadrant ultrasound. Electronically Signed   By: Iven Finn M.D.   On: 03/09/2023 01:39   CT Abdomen Pelvis W Contrast  Result Date: 03/08/2023 CLINICAL DATA:  Acute pancreatitis, abdominal pain. EXAM: CT ABDOMEN AND PELVIS WITH CONTRAST TECHNIQUE: Multidetector CT imaging of the abdomen and pelvis was performed using the standard protocol following bolus administration of intravenous contrast. RADIATION DOSE REDUCTION: This exam was performed according to the departmental dose-optimization program which includes automated exposure control, adjustment of the mA and/or kV according to patient size and/or use of iterative reconstruction technique. CONTRAST:  161mL OMNIPAQUE IOHEXOL 300 MG/ML  SOLN COMPARISON:  CT abdomen and pelvis 02/12/2023 FINDINGS: Lower chest: No acute abnormality. Hepatobiliary: No focal liver abnormality is seen. No gallstones, gallbladder wall thickening, or biliary dilatation.  Pancreas: Inflammatory stranding surrounding the pancreatic head has decreased. Low-attenuation area in the uncinate process measures 1.8 x 1.0 cm and appears similar to prior. No pancreatic ductal dilatation. Spleen: Normal in size without focal abnormality. Adrenals/Urinary Tract: Adrenal glands are unremarkable. Kidneys are normal, without renal calculi, focal lesion, or hydronephrosis. Bladder is unremarkable. Stomach/Bowel: Stomach is within normal limits. Appendix appears normal. No evidence of bowel wall thickening, distention, or inflammatory changes. Vascular/Lymphatic: Aortic atherosclerosis. No enlarged abdominal or pelvic lymph nodes. Reproductive: Prostate is unremarkable. Other: No abdominal wall hernia or abnormality. No abdominopelvic ascites. Musculoskeletal: No acute or significant osseous findings. IMPRESSION: 1. Decreasing inflammatory stranding surrounding the pancreatic head. 2. Unchanged low-attenuation area in the uncinate  process may represent a pseudocyst. 3. No other acute findings in the abdomen or pelvis. Aortic Atherosclerosis (ICD10-I70.0). Electronically Signed   By: Ronney Asters M.D.   On: 03/08/2023 21:52     Scheduled Meds:  amLODipine  5 mg Oral Daily   enoxaparin (LOVENOX) injection  40 mg Subcutaneous Q24H   hydrALAZINE  100 mg Oral TID   potassium chloride  40 mEq Oral BID   Continuous Infusions:     LOS: 2 days   Time spent: Iva, MD [facility]  03/10/2023, 5:44 PM

## 2023-03-10 NOTE — Progress Notes (Signed)
    Patient Name: Max Ayers           DOB: [birth year]  MRN: [id number]      Admission Date: 03/08/2023  Attending Provider: Nita Sells, MD  Primary Diagnosis: Acute recurrent pancreatitis   Level of care: Med-Surg    CROSS COVER NOTE   Date of Service   03/10/2023   [name], 52 y.o. male, was admitted on 03/08/2023 for Acute recurrent pancreatitis.    HPI/Events of Note   RN reports patient has been complaining of headache after each oxycodone use.  Patient has noticed this in the past as well.  He is requesting a change in his oral pain medication.  Has previously used Percocet without complications.   Interventions/ Plan   Switching from oxycodone to [place], DNP, [place]

## 2023-03-11 DIAGNOSIS — K859 Acute pancreatitis without necrosis or infection, unspecified: Secondary | ICD-10-CM | POA: Diagnosis not present

## 2023-03-11 LAB — COMPREHENSIVE METABOLIC PANEL
ALT: 14 U/L (ref 0–44)
AST: 15 U/L (ref 15–41)
Albumin: 4.1 g/dL (ref 3.5–5.0)
Alkaline Phosphatase: 60 U/L (ref 38–126)
Anion gap: 8 (ref 5–15)
BUN: 11 mg/dL (ref 6–20)
CO2: 25 mmol/L (ref 22–32)
Calcium: 9.2 mg/dL (ref 8.9–10.3)
Chloride: 104 mmol/L (ref 98–111)
Creatinine, Ser: 0.93 mg/dL (ref 0.61–1.24)
GFR, Estimated: >60 mL/min (ref >60)
Glucose, Bld: 100 mg/dL — ABNORMAL HIGH (ref 70–99)
Potassium: 3.7 mmol/L (ref 3.5–5.1)
Sodium: 137 mmol/L (ref 135–145)
Total Bilirubin: 0.7 mg/dL (ref 0.3–1.2)
Total Protein: 7.3 g/dL (ref 6.5–8.1)

## 2023-03-11 LAB — MAGNESIUM: Magnesium: 2.2 mg/dL (ref 1.7–2.4)

## 2023-03-11 MED ORDER — AMLODIPINE BESYLATE 10 MG PO TABS
10.0000 mg | ORAL_TABLET | Freq: Every day | ORAL | Status: DC
  Administered 2023-03-11 – 2023-03-12 (×2): 10 mg via ORAL
  Filled 2023-03-11 (×2): qty 1

## 2023-03-11 MED ORDER — HYDROMORPHONE HCL 1 MG/ML IJ SOLN: 1.0000 mg | INTRAMUSCULAR | Status: DC | PRN

## 2023-03-11 MED ORDER — POLYETHYLENE GLYCOL 3350 17 G PO PACK
17.0000 g | PACK | Freq: Every day | ORAL | Status: DC
  Administered 2023-03-11 – 2023-03-12 (×2): 17 g via ORAL
  Filled 2023-03-11 (×2): qty 1

## 2023-03-11 NOTE — Progress Notes (Signed)
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 Paulita Fujita on 3/18 and told that he will need a month from last admission to do an endoscopic drainage of some sort--- this obviously set him back  CT abd/pelv decreased inflammatory stranding surrounding the pancreatic head, unchanged low-attenuation area in the uncinate process which may represent a pseudocyst, no pancreatic ductal dilatation. No other acute findings in the abdomen or pelvis.   ED labs revealed lipase 1608, triglycerides 129, sodium 140, potassium 3.4, bicarb 25, BUN 16. 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  Mild obesity BMI 27 Outpatient follow-up    DVT prophylaxis: Lovenox  Code  Status: FULL Family Communication:  Disposition:  Status is: Inpatient Remains inpatient appropriate because:  He needs to mobilize we have given him scheduled laxative and if his pain is under better control we will be able to discharge him probably on 3/31.    Consultants:  None   Procedures: None   Antimicrobials: None     Subjective:  Headaches last p.m. necessitating change in pain meds He is tolerating breakfast without nausea but is still having abdominal pain to some degree after having this-he rated the pain at about a 7 on 10-more manageable but required IV Dilaudid   Objective: Vitals:   03/10/23 0602 03/10/23 1230 03/10/23 2032 03/11/23 0939  BP: (!) 151/90 (!) 165/89 (!) 161/96 (!) 140/78  Pulse: 83 89 70   Resp: 18 18 18    Temp: 98.9 F (37.2 C) 98.1 F (36.7 C) 98.3 F (36.8 C)   TempSrc: Oral Oral Oral   SpO2: 96% 97% 97%   Weight:      Height:        Intake/Output Summary (Last 24 hours) at 03/11/2023 1143 Last data filed at 03/10/2023 1300 Gross per 24 hour  Intake 220 ml  Output --  Net 220 ml    Filed Weights   03/08/23 1900  Weight: 90.7 kg    Examination:  EOMI NCAT no focal deficit-slightly anxious- No icterus pallor Abdomen is slightly distended no rebound no guarding anicteric No lower extremity edema Neuro intact to power moving 4 limbs equally   Data Reviewed: personally reviewed   CBC  Component Value Date/Time   WBC 5.0 03/10/2023 0343   RBC 4.54 03/10/2023 0343   HGB 13.3 03/10/2023 0343   HCT 40.4 03/10/2023 0343   PLT 209 03/10/2023 0343   MCV 89.0 03/10/2023 0343   MCH 29.3 03/10/2023 0343   MCHC 32.9 03/10/2023 0343   RDW 13.2 03/10/2023 0343   LYMPHSABS 2.0 03/08/2023 1932   MONOABS 0.4 03/08/2023 1932   EOSABS 0.1 03/08/2023 1932   BASOSABS 0.0 03/08/2023 1932      Latest Ref Rng & Units 03/11/2023    3:55 AM 03/10/2023    3:43 AM 03/09/2023    4:03 AM  CMP  Glucose 70 - 99 mg/dL 100  145  104   BUN  6 - 20 mg/dL 11  12  13    Creatinine 0.61 - 1.24 mg/dL 0.93  0.82  0.86   Sodium 135 - 145 mmol/L 137  134  140   Potassium 3.5 - 5.1 mmol/L 3.7  3.0  3.7   Chloride 98 - 111 mmol/L 104  101  105   CO2 22 - 32 mmol/L 25  25  28    Calcium 8.9 - 10.3 mg/dL 9.2  8.3  8.9   Total Protein 6.5 - 8.1 g/dL 7.3  7.0  6.9   Total Bilirubin 0.3 - 1.2 mg/dL 0.7  0.5  0.5   Alkaline Phos 38 - 126 U/L 60  60  57   AST 15 - 41 U/L 15  16  14    ALT 0 - 44 U/L 14  14  16       Radiology Studies: No results found.   Scheduled Meds:  amLODipine  10 mg Oral Daily   enoxaparin (LOVENOX) injection  40 mg Subcutaneous Q24H   hydrALAZINE  100 mg Oral TID   polyethylene glycol  17 g Oral Daily   potassium chloride  40 mEq Oral BID   Continuous Infusions:     LOS: 3 days   Time spent: 20  Nita Sells, MD [facility]  03/11/2023, 11:43 AM

## 2023-03-12 DIAGNOSIS — K859 Acute pancreatitis without necrosis or infection, unspecified: Secondary | ICD-10-CM | POA: Diagnosis not present

## 2023-03-12 MED ORDER — AMLODIPINE BESYLATE 10 MG PO TABS: 10.0000 mg | ORAL_TABLET | Freq: Every day | ORAL | 0 refills | Status: DC

## 2023-03-12 MED ORDER — OXYCODONE-ACETAMINOPHEN 5-325 MG PO TABS: 1.0000 | ORAL_TABLET | Freq: Four times a day (QID) | ORAL | 0 refills | Status: AC | PRN

## 2023-03-12 NOTE — Progress Notes (Signed)
Discussed AVS with patient verbalizing understanding. Patient left ambulatory with all bedside belongings to main entrance to discharge home with his wife

## 2023-03-12 NOTE — Discharge Summary (Signed)
Physician Discharge Summary  Max Ayers [id number] DOB: 16-Aug-1971 DOA: 03/08/2023  PCP: Willeen Niece, PA  Admit date: 03/08/2023 Discharge date: 03/12/2023  Time spent: 36 minutes  Recommendations for Outpatient Follow-up:  Needs Chem-12 CBC 1 week Limited prescription of opiates given to be refilled by primary PA Needs outpatient Dr. 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 Stacie Glaze GI HTN uncontrolled Hypo-kalemia Mild obesity  Please see below for itemized issues addressed in [place]- refer to other progress notes for clarity if needed  Discharge Condition: Fair  Diet recommendation: Soft  Filed Weights   03/08/23 1900  Weight: 90.7 kg    History of present illness:  52 year old black male Prior EtOH quit April 2023--first diagnosed with pancreatitis 08/2020 and admitted for the same several times Is known to have a pancreatic pseudocyst that has been stable He was last seen at [facility] 09/13/2022 as an inpatient consult and recommended to avoid vaping Zetia and was told to follow-up in the outpatient setting for EGD   I actually took care of this patient earlier this month He is known to have a pancreatic pseudocyst 12 X 17 mm He was seen recently by Dr. Paulita Fujita on 3/18 and told that he will need a month from last admission to do an endoscopic drainage of some sort--- this obviously set him back   CT abd/pelv decreased inflammatory stranding surrounding the pancreatic head, unchanged low-attenuation area in the uncinate process which may represent a pseudocyst, no pancreatic ductal dilatation. No other acute findings in the abdomen or pelvis.    ED labs revealed lipase 1608, triglycerides 129, sodium 140, potassium 3.4, bicarb 25, BUN 16. Cr 0.93  Hospital  Course:  Acute recurrent pancreatitis in the setting of known pancreatic pseudocyst 12 x 17 Pain meds adjusted to Percocet given headaches with Oxy IR IV pain meds were eventually tapered down to just p.o. and he was given a very limited prescription of the Percocet on discharge with instructions to follow-up with PMD Patient is to continue as an outpatient with Dr. Paulita Fujita who was planning what sounds like an endoscopic drainage of the pseudocyst--- I have CCed Dr. Paulita Fujita   HTN Amlodipine upward adjusted to 10 mg hydralazine dosing remains the same-pressure is better controlled   Mild hypokalemia Continuing potassium replacement K-Dur 40 twice daily and recheck labs in a.m.   Mild obesity BMI 27 Outpatient follow-up   Discharge Exam: Vitals:   03/11/23 2059 03/12/23 0752  BP: 131/83 123/84  Pulse: 82 65  Resp: 18   Temp: 98.4 F (36.9 C) 97.8 F (36.6 C)  SpO2: 97% 100%    Subj on day of d/c   Awake coherent less pain now 6/10 Ambulatory no stool yet but wants to go home to pass stool  General Exam on discharge  EOMI NCAT no focal deficit no icterus no pallor no rales Chest clear no added sound Abdomen nontender at time of discharge No lower extremity edema  Discharge Instructions   Discharge Instructions     Diet - low sodium heart healthy   Complete by: As directed    Discharge instructions   Complete by: As directed    Make sure that you take your pain meds sparingly and use Tylenol as first choice for the time being and the oxycodone only if you  really need it for pain >6/10-please be aware that your pain and discomfort will probably not completely go away and that Dr. Paulita Fujita and you should have a chat within a week of discharge to determine the next steps in terms of what to do about this issue My suggestion is that if you continue to have similar issues, you may require revisit to his office Note that we have increased your amlodipine because of uncontrolled  blood pressure Please get labs in about a week at your primary physician office Please follow-up with Dr. Paulita Fujita as per his instructions when you call him   Increase activity slowly   Complete by: As directed       Allergies as of 03/12/2023       Reactions   Yellow Jacket Venom [bee Venom] Anaphylaxis, Hives   Gadolinium Derivatives Nausea Only   2nd time in a row. Pt received MRI contrast and immediately experienced nausea.         Medication List     TAKE these medications    amLODipine 10 MG tablet Commonly known as: NORVASC Take 1 tablet (10 mg total) by mouth daily. What changed:  medication strength how much to take   Clear Eyes Complete Soln Place 2 drops into both eyes as needed (for irritation).   hydrALAZINE 100 MG tablet Commonly known as: APRESOLINE Take 1 tablet (100 mg total) by mouth every 8 (eight) hours. What changed: when to take this   ondansetron 4 MG tablet Commonly known as: Zofran Take 1 tablet (4 mg total) by mouth every 8 (eight) hours as needed for nausea or vomiting.   oxyCODONE-acetaminophen 5-325 MG tablet Commonly known as: PERCOCET/ROXICET Take 1 tablet by mouth every 6 (six) hours as needed for up to 4 days for severe pain or moderate pain.   pantoprazole 40 MG tablet Commonly known as: PROTONIX Take 1 tablet (40 mg total) by mouth at bedtime. What changed:  when to take this reasons to take this   polyethylene glycol 17 g packet Commonly known as: MIRALAX / GLYCOLAX Take 17 g by mouth daily. What changed:  when to take this reasons to take this   potassium chloride SA 20 MEQ tablet Commonly known as: KLOR-CON M Take 2 tablets (40 mEq total) by mouth daily for 5 days.   TYLENOL 500 MG tablet Generic drug: acetaminophen Take 1,000 mg by mouth as needed for headache or mild pain.       Allergies  Allergen Reactions   Yellow Jacket Venom [Bee Venom] Anaphylaxis and Hives   Gadolinium Derivatives Nausea Only    2nd  time in a row. Pt received MRI contrast and immediately experienced nausea.       The results of significant diagnostics from this hospitalization (including imaging, microbiology, ancillary and laboratory) are listed below for reference.    Significant Diagnostic Studies: US Abdomen Limited RUQ (LIVER/GB)  Result Date: 03/09/2023 CLINICAL DATA:  [id number] Acute recurrent pancreatitis [id number] EXAM: ULTRASOUND ABDOMEN LIMITED RIGHT UPPER QUADRANT COMPARISON:  CT abdomen pelvis 03/08/2023 FINDINGS: Gallbladder: No gallstones or wall thickening visualized. No sonographic Murphy sign noted by sonographer; however, per sonographer the patient is pre-medicated. Common bile duct: Diameter: 5 mm Liver: No focal lesion identified. Within normal limits in parenchymal echogenicity. Portal vein is patent on color Doppler imaging with normal direction of blood flow towards the liver. Other: None. IMPRESSION: Unremarkable right upper quadrant ultrasound. Electronically Signed   By: Iven Finn M.D.   On: 03/09/2023  01:39   CT Abdomen Pelvis W Contrast  Result Date: 03/08/2023 CLINICAL DATA:  Acute pancreatitis, abdominal pain. EXAM: CT ABDOMEN AND PELVIS WITH CONTRAST TECHNIQUE: Multidetector CT imaging of the abdomen and pelvis was performed using the standard protocol following bolus administration of intravenous contrast. RADIATION DOSE REDUCTION: This exam was performed according to the departmental dose-optimization program which includes automated exposure control, adjustment of the mA and/or kV according to patient size and/or use of iterative reconstruction technique. CONTRAST:  157mL OMNIPAQUE IOHEXOL 300 MG/ML  SOLN COMPARISON:  CT abdomen and pelvis 02/12/2023 FINDINGS: Lower chest: No acute abnormality. Hepatobiliary: No focal liver abnormality is seen. No gallstones, gallbladder wall thickening, or biliary dilatation. Pancreas: Inflammatory stranding surrounding the pancreatic head has decreased.  Low-attenuation area in the uncinate process measures 1.8 x 1.0 cm and appears similar to prior. No pancreatic ductal dilatation. Spleen: Normal in size without focal abnormality. Adrenals/Urinary Tract: Adrenal glands are unremarkable. Kidneys are normal, without renal calculi, focal lesion, or hydronephrosis. Bladder is unremarkable. Stomach/Bowel: Stomach is within normal limits. Appendix appears normal. No evidence of bowel wall thickening, distention, or inflammatory changes. Vascular/Lymphatic: Aortic atherosclerosis. No enlarged abdominal or pelvic lymph nodes. Reproductive: Prostate is unremarkable. Other: No abdominal wall hernia or abnormality. No abdominopelvic ascites. Musculoskeletal: No acute or significant osseous findings. IMPRESSION: 1. Decreasing inflammatory stranding surrounding the pancreatic head. 2. Unchanged low-attenuation area in the uncinate process may represent a pseudocyst. 3. No other acute findings in the abdomen or pelvis. Aortic Atherosclerosis (ICD10-I70.0). Electronically Signed   By: Ronney Asters M.D.   On: 03/08/2023 21:52   CT ABDOMEN PELVIS W CONTRAST  Result Date: 02/12/2023 CLINICAL DATA:  Epigastric pain radiating to the back. Previous history of pancreatitis EXAM: CT ABDOMEN AND PELVIS WITH CONTRAST TECHNIQUE: Multidetector CT imaging of the abdomen and pelvis was performed using the standard protocol following bolus administration of intravenous contrast. RADIATION DOSE REDUCTION: This exam was performed according to the departmental dose-optimization program which includes automated exposure control, adjustment of the mA and/or kV according to patient size and/or use of iterative reconstruction technique. CONTRAST:  112mL OMNIPAQUE IOHEXOL 300 MG/ML  SOLN COMPARISON:  MRI 01/03/2023.  CT scan 12/30/2022 FINDINGS: Lower chest: There is some linear opacity lung bases likely scar or atelectasis. No pleural effusion. Hepatobiliary: No space-occupying liver lesion. Patent  portal vein. Gallbladder is nondilated. Pancreas: Once again there is severe pancreatic ductal dilatation in the midbody. There continues to be some peripancreatic fat stranding particularly towards the head and neck region but the level of stranding is less in the study of January 2024. In addition there is a cystic lesion identified in the uncinate process of the pancreas. Previously this measured 17 x 11 mm by CT and non MRI 17 x 13 mm. Today this area measures 17 by 12 mm. Pseudocysts. This does not clearly communicate with pancreatic duct on coronal imaging. No new fluid collection identified. Preserved pancreatic parenchymal enhancement. Spleen: Normal in size without focal abnormality. The splenic vein is intact. Adrenals/Urinary Tract: Adrenal glands are preserved. No enhancing renal mass or collecting system dilatation. The ureters have normal course and caliber extending down to the bladder. Preserved contours of the urinary bladder. Stomach/Bowel: Moderate colonic stool. Few scattered colonic diverticula are seen including along the right side of the colon. Normal retrocecal appendix. Stomach is mildly distended with fluid and some air. Vascular/Lymphatic: Normal caliber aorta and IVC. Mild atherosclerotic partially calcified plaque along the aorta and branch vessels. There are some small nodes  identified in the retroperitoneum which are not pathologic by size criteria. Similar along the mesentery Reproductive: Prostate is unremarkable. Other: No free air.  No frank ascites. Musculoskeletal: No acute or significant osseous findings. IMPRESSION: Interval improvement in the peripancreatic and mesenteric fat stranding with some residual. Persistent pancreatic ductal dilatation with a cystic lesion in the uncinate process measuring 17 x 12 mm. Possible pseudocyst. Preserved pancreatic parenchyma. Please correlate for any persistent clinical evidence of acute on subacute pancreatitis Scattered colonic stool  with some diverticula.  Normal appendix. Electronically Signed   By: Jill Side M.D.   On: 02/12/2023 16:29   DG Chest 2 View  Result Date: 02/12/2023 CLINICAL DATA:  Epigastric pain. EXAM: CHEST - 2 VIEW COMPARISON:  None Available. FINDINGS: The cardiopericardial silhouette is within normal limits for size. The nodular density at the left base may be related to superimposition of soft tissue or double shadow The cardiopericardial silhouette is within normal limits for size. The visualized bony structures of the thorax are unremarkable. IMPRESSION: Nodular density at the left base may be related to superimposition of soft tissue or double shadow. Repeat frontal radiograph with nipple markers could be used to further evaluate. Alternatively CT chest without contrast could be performed as clinically warranted. Electronically Signed   By: Misty Stanley M.D.   On: 02/12/2023 09:54    Microbiology: No results found for this or any previous visit (from the past 240 hour(s)).   Labs: Basic Metabolic Panel: Recent Labs  Lab 03/08/23 1932 03/09/23 0403 03/10/23 0343 03/11/23 0355  NA 140 140 134* 137  K 3.4* 3.7 3.0* 3.7  CL 107 105 101 104  CO2 25 28 25 25   GLUCOSE 128* 104* 145* 100*  BUN 16 13 12 11   CREATININE 0.93 0.86 0.82 0.93  CALCIUM 8.8* 8.9 8.3* 9.2  MG  --   --  2.1 2.2   Liver Function Tests: Recent Labs  Lab 03/08/23 1932 03/09/23 0403 03/10/23 0343 03/11/23 0355  AST 16 14* 16 15  ALT 16 16 14 14   ALKPHOS 55 57 60 60  BILITOT 0.4 0.5 0.5 0.7  PROT 7.1 6.9 7.0 7.3  ALBUMIN 4.0 4.1 3.6 4.1   Recent Labs  Lab 03/08/23 1932  LIPASE 1,608*   No results for input(s): "AMMONIA" in the last 168 hours. CBC: Recent Labs  Lab 03/08/23 1932 03/09/23 0403 03/10/23 0343  WBC 6.4 6.8 5.0  NEUTROABS 3.9  --   --   HGB 13.5 13.3 13.3  HCT 39.9 40.3 40.4  MCV 87.7 89.4 89.0  PLT 219 205 209   Cardiac Enzymes: No results for input(s): "CKTOTAL", "CKMB", "CKMBINDEX",  "TROPONINI" in the last 168 hours. BNP: BNP (last 3 results) No results for input(s): "BNP" in the last 8760 hours.  ProBNP (last 3 results) No results for input(s): "PROBNP" in the last 8760 hours.  CBG: No results for input(s): "GLUCAP" in the last 168 hours.     Signed:  Nita Sells MD   [facility] 03/12/2023, 8:56 AM

## 2023-03-12 NOTE — Plan of Care (Signed)

## 2023-04-11 ENCOUNTER — Emergency Department (HOSPITAL_BASED_OUTPATIENT_CLINIC_OR_DEPARTMENT_OTHER): Payer: Medicaid Other

## 2023-04-11 ENCOUNTER — Other Ambulatory Visit: Payer: Self-pay

## 2023-04-11 ENCOUNTER — Inpatient Hospital Stay (HOSPITAL_BASED_OUTPATIENT_CLINIC_OR_DEPARTMENT_OTHER)
Admission: EM | Admit: 2023-04-11 | Discharge: 2023-04-15 | DRG: 439 | Disposition: A | Discharge status: Home / Self Care | Payer: Medicaid Other | Attending: Internal Medicine | Admitting: Internal Medicine

## 2023-04-11 ENCOUNTER — Encounter (HOSPITAL_BASED_OUTPATIENT_CLINIC_OR_DEPARTMENT_OTHER): Payer: Self-pay | Admitting: Emergency Medicine

## 2023-04-11 DIAGNOSIS — Z803 Family history of malignant neoplasm of breast: Secondary | ICD-10-CM | POA: Diagnosis not present

## 2023-04-11 DIAGNOSIS — F1010 Alcohol abuse, uncomplicated: Secondary | ICD-10-CM | POA: Diagnosis present

## 2023-04-11 DIAGNOSIS — I10 Essential (primary) hypertension: Secondary | ICD-10-CM | POA: Diagnosis present

## 2023-04-11 DIAGNOSIS — K219 Gastro-esophageal reflux disease without esophagitis: Secondary | ICD-10-CM | POA: Diagnosis present

## 2023-04-11 DIAGNOSIS — E785 Hyperlipidemia, unspecified: Secondary | ICD-10-CM | POA: Diagnosis present

## 2023-04-11 DIAGNOSIS — K863 Pseudocyst of pancreas: Secondary | ICD-10-CM | POA: Diagnosis present

## 2023-04-11 DIAGNOSIS — Z833 Family history of diabetes mellitus: Secondary | ICD-10-CM | POA: Diagnosis not present

## 2023-04-11 DIAGNOSIS — Z8249 Family history of ischemic heart disease and other diseases of the circulatory system: Secondary | ICD-10-CM | POA: Diagnosis not present

## 2023-04-11 DIAGNOSIS — Z91041 Radiographic dye allergy status: Secondary | ICD-10-CM

## 2023-04-11 DIAGNOSIS — F17290 Nicotine dependence, other tobacco product, uncomplicated: Secondary | ICD-10-CM | POA: Diagnosis present

## 2023-04-11 DIAGNOSIS — Z6828 Body mass index (BMI) 28.0-28.9, adult: Secondary | ICD-10-CM | POA: Diagnosis not present

## 2023-04-11 DIAGNOSIS — K861 Other chronic pancreatitis: Secondary | ICD-10-CM | POA: Diagnosis present

## 2023-04-11 DIAGNOSIS — E663 Overweight: Secondary | ICD-10-CM | POA: Diagnosis present

## 2023-04-11 DIAGNOSIS — K859 Acute pancreatitis without necrosis or infection, unspecified: Secondary | ICD-10-CM | POA: Diagnosis present

## 2023-04-11 DIAGNOSIS — K852 Alcohol induced acute pancreatitis without necrosis or infection: Secondary | ICD-10-CM | POA: Diagnosis not present

## 2023-04-11 DIAGNOSIS — Z91030 Bee allergy status: Secondary | ICD-10-CM | POA: Diagnosis not present

## 2023-04-11 DIAGNOSIS — E782 Mixed hyperlipidemia: Secondary | ICD-10-CM | POA: Diagnosis not present

## 2023-04-11 DIAGNOSIS — Z79899 Other long term (current) drug therapy: Secondary | ICD-10-CM | POA: Diagnosis not present

## 2023-04-11 LAB — COMPREHENSIVE METABOLIC PANEL
ALT: 22 U/L (ref 0–44)
AST: 21 U/L (ref 15–41)
Albumin: 4.3 g/dL (ref 3.5–5.0)
Alkaline Phosphatase: 73 U/L (ref 38–126)
Anion gap: 7 (ref 5–15)
BUN: 17 mg/dL (ref 6–20)
CO2: 25 mmol/L (ref 22–32)
Calcium: 9.2 mg/dL (ref 8.9–10.3)
Chloride: 106 mmol/L (ref 98–111)
Creatinine, Ser: 1.01 mg/dL (ref 0.61–1.24)
GFR, Estimated: >60 mL/min (ref >60)
Glucose, Bld: 114 mg/dL — ABNORMAL HIGH (ref 70–99)
Potassium: 3.5 mmol/L (ref 3.5–5.1)
Sodium: 138 mmol/L (ref 135–145)
Total Bilirubin: 0.3 mg/dL (ref 0.3–1.2)
Total Protein: 8 g/dL (ref 6.5–8.1)

## 2023-04-11 LAB — DIFFERENTIAL
Abs Immature Granulocytes: 0.01 10*3/uL (ref 0.00–0.07)
Basophils Absolute: 0 10*3/uL (ref 0.0–0.1)
Basophils Relative: 0 %
Eosinophils Absolute: 0.1 10*3/uL (ref 0.0–0.5)
Eosinophils Relative: 2 %
Immature Granulocytes: 0 %
Lymphocytes Relative: 50 %
Lymphs Abs: 2.3 10*3/uL (ref 0.7–4.0)
Monocytes Absolute: 0.5 10*3/uL (ref 0.1–1.0)
Monocytes Relative: 10 %
Neutro Abs: 1.7 10*3/uL (ref 1.7–7.7)
Neutrophils Relative %: 38 %

## 2023-04-11 LAB — CBC
HCT: 44.9 % (ref 39.0–52.0)
HCT: 47.5 % (ref 39.0–52.0)
Hemoglobin: 15.2 g/dL (ref 13.0–17.0)
Hemoglobin: 15.5 g/dL (ref 13.0–17.0)
MCH: 29 pg (ref 26.0–34.0)
MCH: 29 pg (ref 26.0–34.0)
MCHC: 33.9 g/dL (ref 30.0–36.0)
MCHC: 32.6 g/dL (ref 30.0–36.0)
MCV: 85.5 fL (ref 80.0–100.0)
MCV: 89 fL (ref 80.0–100.0)
Platelets: 222 10*3/uL (ref 150–400)
Platelets: 220 10*3/uL (ref 150–400)
RBC: 5.25 MIL/uL (ref 4.22–5.81)
RBC: 5.34 MIL/uL (ref 4.22–5.81)
RDW: 13 % (ref 11.5–15.5)
RDW: 13.2 % (ref 11.5–15.5)
WBC: 4.5 10*3/uL (ref 4.0–10.5)
WBC: 4.6 10*3/uL (ref 4.0–10.5)
nRBC: 0 % (ref 0.0–0.2)
nRBC: 0 % (ref 0.0–0.2)

## 2023-04-11 LAB — LIPASE, BLOOD: Lipase: 616 U/L — ABNORMAL HIGH (ref 11–51)

## 2023-04-11 LAB — CREATININE, SERUM
Creatinine, Ser: 0.96 mg/dL (ref 0.61–1.24)
GFR, Estimated: >60 mL/min (ref >60)

## 2023-04-11 MED ORDER — DOCUSATE SODIUM 100 MG PO CAPS
100.0000 mg | ORAL_CAPSULE | Freq: Two times a day (BID) | ORAL | Status: DC
  Administered 2023-04-11 – 2023-04-15 (×9): 100 mg via ORAL
  Filled 2023-04-11 (×9): qty 1

## 2023-04-11 MED ORDER — ONDANSETRON HCL 4 MG/2ML IJ SOLN
4.0000 mg | Freq: Four times a day (QID) | INTRAMUSCULAR | Status: DC | PRN
  Administered 2023-04-11 (×2): 4 mg via INTRAVENOUS
  Filled 2023-04-11 (×2): qty 2

## 2023-04-11 MED ORDER — ENOXAPARIN SODIUM 40 MG/0.4ML IJ SOSY
40.0000 mg | PREFILLED_SYRINGE | INTRAMUSCULAR | Status: DC
  Administered 2023-04-11 – 2023-04-15 (×5): 40 mg via SUBCUTANEOUS
  Filled 2023-04-11 (×5): qty 0.4

## 2023-04-11 MED ORDER — HYDRALAZINE HCL 50 MG PO TABS
100.0000 mg | ORAL_TABLET | Freq: Three times a day (TID) | ORAL | Status: DC
  Administered 2023-04-11 – 2023-04-15 (×13): 100 mg via ORAL
  Filled 2023-04-11 (×13): qty 2

## 2023-04-11 MED ORDER — SODIUM CHLORIDE 0.9 % IV BOLUS
1000.0000 mL | Freq: Once | INTRAVENOUS | Status: AC
  Administered 2023-04-11: 1000 mL via INTRAVENOUS

## 2023-04-11 MED ORDER — METOPROLOL TARTRATE 5 MG/5ML IV SOLN: 5.0000 mg | Freq: Four times a day (QID) | INTRAVENOUS | Status: DC | PRN

## 2023-04-11 MED ORDER — HYDROMORPHONE HCL 1 MG/ML IJ SOLN
1.0000 mg | Freq: Once | INTRAMUSCULAR | Status: AC
  Administered 2023-04-11: 1 mg via INTRAVENOUS
  Filled 2023-04-11: qty 1

## 2023-04-11 MED ORDER — HYDROMORPHONE HCL 2 MG PO TABS
2.0000 mg | ORAL_TABLET | ORAL | Status: DC | PRN
  Administered 2023-04-12: 2 mg via ORAL
  Filled 2023-04-11 (×2): qty 1

## 2023-04-11 MED ORDER — ONDANSETRON HCL 4 MG PO TABS: 4.0000 mg | ORAL_TABLET | Freq: Four times a day (QID) | ORAL | Status: DC | PRN

## 2023-04-11 MED ORDER — ONDANSETRON HCL 4 MG/2ML IJ SOLN
4.0000 mg | Freq: Once | INTRAMUSCULAR | Status: AC
  Administered 2023-04-11: 4 mg via INTRAVENOUS
  Filled 2023-04-11: qty 2

## 2023-04-11 MED ORDER — POLYETHYLENE GLYCOL 3350 17 G PO PACK
17.0000 g | PACK | Freq: Every day | ORAL | Status: DC | PRN
  Administered 2023-04-14: 17 g via ORAL
  Filled 2023-04-11 (×2): qty 1

## 2023-04-11 MED ORDER — SODIUM CHLORIDE 0.9 % IV SOLN: Freq: Once | INTRAVENOUS | Status: AC

## 2023-04-11 MED ORDER — ALBUTEROL SULFATE (2.5 MG/3ML) 0.083% IN NEBU: 2.5000 mg | INHALATION_SOLUTION | RESPIRATORY_TRACT | Status: DC | PRN

## 2023-04-11 MED ORDER — AMLODIPINE BESYLATE 10 MG PO TABS
10.0000 mg | ORAL_TABLET | Freq: Every day | ORAL | Status: DC
  Administered 2023-04-11 – 2023-04-15 (×5): 10 mg via ORAL
  Filled 2023-04-11 (×5): qty 1

## 2023-04-11 MED ORDER — PANTOPRAZOLE SODIUM 40 MG PO TBEC
40.0000 mg | DELAYED_RELEASE_TABLET | Freq: Every day | ORAL | Status: DC
  Administered 2023-04-11 – 2023-04-14 (×4): 40 mg via ORAL
  Filled 2023-04-11 (×4): qty 1

## 2023-04-11 MED ORDER — HYDROMORPHONE HCL 1 MG/ML IJ SOLN
1.0000 mg | INTRAMUSCULAR | Status: DC | PRN
  Administered 2023-04-11 – 2023-04-15 (×20): 1 mg via INTRAVENOUS
  Filled 2023-04-11 (×20): qty 1

## 2023-04-11 MED ORDER — SODIUM CHLORIDE 0.9 % IV SOLN: INTRAVENOUS | Status: DC

## 2023-04-11 MED ORDER — IOHEXOL 300 MG/ML  SOLN
100.0000 mL | Freq: Once | INTRAMUSCULAR | Status: AC | PRN
  Administered 2023-04-11: 100 mL via INTRAVENOUS

## 2023-04-11 NOTE — ED Triage Notes (Signed)
Mid/upper right side abdominal pain that radiates to back. Denies injury and illness. States feels like pancreas. Has Hx of same.

## 2023-04-11 NOTE — Progress Notes (Signed)
Plan of Care Note for accepted transfer   Patient: Max Ayers MRN: [id number]   DOA: 04/11/2023  Facility requesting transfer: [facility] ED Requesting Provider: Dr Read Drivers, EDP Reason for transfer: Acute pancreatitis.  Facility course: The patient is a 52 year old male with past medical history significant for former alcohol abuse, recurrent pancreatitis, hypertension, tobacco use disorder, who presented to [facility] ED due to upper abdominal pain radiating to his back, similar to prior episode of acute pancreatitis.  Lab studies notable for elevated lipase level.  CT abdomen pelvis with contrast results are pending.  The patient received IV fluid hydration NS bolus 1 L x 1, IV fluid maintenance initiated.  Additionally, he received IV opioid-based analgesics, 1 mg x 1.  Admitted by [facility], hospitalist service, to [facility] telemetry unit at this inpatient status.  Plan of care: The patient is accepted for admission to Telemetry unit, at [facility].  Author: Darlin Drop, DO 04/11/2023  Check [email or web address] for on-call coverage.  Nursing staff, Please call [facility] Admits & Consults System-Wide number on Amion as soon as patient's arrival, so appropriate admitting provider can evaluate the pt.

## 2023-04-11 NOTE — ED Provider Notes (Signed)
[facility] Provider Note: Lowella Dell, MD, FACEP  CSN: [id number] MRN: [id number] ARRIVAL: 04/11/23 at 0310 ROOM: MH05/MH05   CHIEF COMPLAINT  Abdominal Pain   HISTORY OF PRESENT ILLNESS  04/11/23 4:27 AM Max Ayers is a 52 y.o. male with epigastric and right upper abdominal pain that radiates into his back.  He has a history of pancreatitis and states this feels similar.  He rates his pain as a 10 out of 10, worse with palpation.  The pain started about a day and a half ago but became severe this morning.  He had 1 episode of vomiting with it.  He does not currently drink alcohol.   Past Medical History:  Diagnosis Date   Acute alcoholic pancreatitis 12/14/2021   Hypertension    Pancreatitis    Tobacco use 12/12/2020    Past Surgical History:  Procedure Laterality Date   NO PAST SURGERIES      Family History  Problem Relation Age of Onset   Diabetes Mother    Hypertension Father    Breast cancer Maternal Grandmother    Colon cancer Neg Hx    Stomach cancer Neg Hx    Liver disease Neg Hx    Pancreatic cancer Neg Hx    Esophageal cancer Neg Hx    Rectal cancer Neg Hx     Social History   Tobacco Use   Smoking status: Former    Packs/day: 1    Types: Cigarettes, Cigars   Smokeless tobacco: Never  Vaping Use   Vaping Use: Every day   Substances: Flavoring  Substance Use Topics   Alcohol use: Not Currently    Comment: occ   Drug use: No    Prior to Admission medications   Medication Sig Start Date End Date Taking? Authorizing Provider  amLODipine (NORVASC) 10 MG tablet Take 1 tablet (10 mg total) by mouth daily. 03/12/23   Rhetta Mura, MD  hydrALAZINE (APRESOLINE) 100 MG tablet Take 1 tablet (100 mg total) by mouth every 8 (eight) hours. Patient taking differently: Take 100 mg by mouth 3 (three) times daily. 11/22/22   Azucena Fallen, MD  Hyprom-Naphaz-Polysorb-Zn Sulf (CLEAR EYES COMPLETE) SOLN Place 2 drops into both eyes  as needed (for irritation).    [provider]  ondansetron (ZOFRAN) 4 MG tablet Take 1 tablet (4 mg total) by mouth every 8 (eight) hours as needed for nausea or vomiting. 01/04/23 01/04/24  Rolly Salter, MD  pantoprazole (PROTONIX) 40 MG tablet Take 1 tablet (40 mg total) by mouth at bedtime. Patient taking differently: Take 40 mg by mouth daily as needed (heartburn). 01/04/23 04/04/23  Rolly Salter, MD  polyethylene glycol (MIRALAX / GLYCOLAX) 17 g packet Take 17 g by mouth daily. Patient taking differently: Take 17 g by mouth daily as needed for moderate constipation. 01/05/23   Rolly Salter, MD  potassium chloride SA (KLOR-CON M) 20 MEQ tablet Take 2 tablets (40 mEq total) by mouth daily for 5 days. 02/17/23 02/22/23  Burnadette Pop, MD  TYLENOL 500 MG tablet Take 1,000 mg by mouth as needed for headache or mild pain.    [provider]    Allergies Yellow jacket venom [bee venom] and Gadolinium derivatives   REVIEW OF SYSTEMS  Negative except as noted here or in the History of Present Illness.   PHYSICAL EXAMINATION  Initial Vital Signs Blood pressure (!) 152/99, pulse 85, temperature 98.1 F (36.7 C), temperature source Oral, resp. rate 18,  height 5\' 11"  (1.803 m), weight 92.1 kg, SpO2 99 %.  Examination General: Well-developed, well-nourished male in no acute distress; appearance consistent with age of record HENT: normocephalic; atraumatic Eyes: Normal appearance Neck: supple Heart: regular rate and rhythm Lungs: clear to auscultation bilaterally Abdomen: soft; nondistended; epigastric tenderness; bowel sounds present Extremities: No deformity; full range of motion Neurologic: Awake, alert and oriented; motor function intact in all extremities and symmetric; no facial droop Skin: Warm and dry Psychiatric: Normal mood and affect   RESULTS  Summary of this visit's results, reviewed and interpreted by myself:   EKG Interpretation  Date/Time:     Ventricular Rate:    PR Interval:    QRS Duration:   QT Interval:    QTC Calculation:   R Axis:     Text Interpretation:         Laboratory Studies: Results for orders placed or performed during the hospital encounter of 04/11/23 (from the past 24 hour(s))  Lipase, blood     Status: Abnormal   Collection Time: 04/11/23  3:37 AM  Result Value Ref Range   Lipase 616 (H) 11 - 51 U/L  Comprehensive metabolic panel     Status: Abnormal   Collection Time: 04/11/23  3:37 AM  Result Value Ref Range   Sodium 138 135 - 145 mmol/L   Potassium 3.5 3.5 - 5.1 mmol/L   Chloride 106 98 - 111 mmol/L   CO2 25 22 - 32 mmol/L   Glucose, Bld 114 (H) 70 - 99 mg/dL   BUN 17 6 - 20 mg/dL   Creatinine, Ser 1.61 0.61 - 1.24 mg/dL   Calcium 9.2 8.9 - 09.6 mg/dL   Total Protein 8.0 6.5 - 8.1 g/dL   Albumin 4.3 3.5 - 5.0 g/dL   AST 21 15 - 41 U/L   ALT 22 0 - 44 U/L   Alkaline Phosphatase 73 38 - 126 U/L   Total Bilirubin 0.3 0.3 - 1.2 mg/dL   GFR, Estimated >04 >54 mL/min   Anion gap 7 5 - 15  CBC     Status: None   Collection Time: 04/11/23  3:37 AM  Result Value Ref Range   WBC 4.5 4.0 - 10.5 K/uL   RBC 5.25 4.22 - 5.81 MIL/uL   Hemoglobin 15.2 13.0 - 17.0 g/dL   HCT 09.8 11.9 - 14.7 %   MCV 85.5 80.0 - 100.0 fL   MCH 29.0 26.0 - 34.0 pg   MCHC 33.9 30.0 - 36.0 g/dL   RDW 82.9 56.2 - 13.0 %   Platelets 222 150 - 400 K/uL   nRBC 0.0 0.0 - 0.2 %  Differential     Status: None   Collection Time: 04/11/23  3:37 AM  Result Value Ref Range   Neutrophils Relative % 38 %   Neutro Abs 1.7 1.7 - 7.7 K/uL   Lymphocytes Relative 50 %   Lymphs Abs 2.3 0.7 - 4.0 K/uL   Monocytes Relative 10 %   Monocytes Absolute 0.5 0.1 - 1.0 K/uL   Eosinophils Relative 2 %   Eosinophils Absolute 0.1 0.0 - 0.5 K/uL   Basophils Relative 0 %   Basophils Absolute 0.0 0.0 - 0.1 K/uL   Immature Granulocytes 0 %   Abs Immature Granulocytes 0.01 0.00 - 0.07 K/uL   Imaging Studies: CT ABDOMEN PELVIS W  CONTRAST  Result Date: 04/11/2023 CLINICAL DATA:  52 year old male with history of abdominal pain, most severe on the right side with radiation  to the back. Suspected acute pancreatitis. EXAM: CT ABDOMEN AND PELVIS WITH CONTRAST TECHNIQUE: Multidetector CT imaging of the abdomen and pelvis was performed using the standard protocol following bolus administration of intravenous contrast. RADIATION DOSE REDUCTION: This exam was performed according to the departmental dose-optimization program which includes automated exposure control, adjustment of the mA and/or kV according to patient size and/or use of iterative reconstruction technique. CONTRAST:  OMNIPAQUE IOHEXOL 300 MG/ML  SOLN COMPARISON:  CT of the abdomen and pelvis 03/08/2023. FINDINGS: Lower chest: Unremarkable. Hepatobiliary: No suspicious cystic or solid hepatic lesions. No intra or extrahepatic biliary ductal dilatation. Gallbladder is unremarkable in appearance. 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 Today's study demonstrates some mild dilatation of the main pancreatic duct which measures up to 6 mm in the body of the pancreas. Very mild peripancreatic haziness is noted in the adjacent fat, but no significant peripancreatic fluid collections are noted at this time. Distal body and tail of the pancreas are otherwise unremarkable in appearance. Spleen: Unremarkable. Adrenals/Urinary Tract: Bilateral kidneys and adrenal glands are normal in appearance. No hydroureteronephrosis. Urinary bladder is normal in appearance. Stomach/Bowel: The appearance of the stomach is normal. There is no pathologic dilatation of small bowel or colon. Normal  appendix. Vascular/Lymphatic: Atherosclerosis in the abdominal aorta and pelvic vasculature, without evidence of aneurysm or dissection. No lymphadenopathy noted in the abdomen or pelvis. Reproductive: Prostate gland and seminal vesicles are unremarkable in appearance. Other: No significant volume of ascites.  No pneumoperitoneum. Musculoskeletal: There are no aggressive appearing lytic or blastic lesions noted in the visualized portions of the skeleton. IMPRESSION: 1. Chronic cystic lesion in the head of the pancreas similar to numerous prior examinations dating back to 08/16/2020, presumably a chronic pancreatic pseudocyst. Today's study demonstrates minimal dilatation of the main pancreatic duct in the region of the proximal body of the pancreas, along with some very subtle inflammatory changes in the peripancreatic fat, which may suggest very mild acute pancreatitis. Electronically Signed   By: Trudie Reed M.D.   On: 04/11/2023 06:01    ED COURSE and MDM  Nursing notes, initial and subsequent vitals signs, including pulse oximetry, reviewed and interpreted by myself.  Vitals:   04/11/23 0321  BP: (!) 152/99  Pulse: 85  Resp: 18  Temp: 98.1 F (36.7 C)  TempSrc: Oral  SpO2: 99%  Weight: 92.1 kg  Height: 5\' 11"  (1.803 m)   Medications  0.9 %  sodium chloride infusion (has no administration in time range)  HYDROmorphone (DILAUDID) injection 1 mg (has no administration in time range)  sodium chloride 0.9 % bolus 1,000 mL (1,000 mLs Intravenous New Bag/Given 04/11/23 0451)  ondansetron (ZOFRAN) injection 4 mg (4 mg Intravenous Given 04/11/23 0451)  HYDROmorphone (DILAUDID) injection 1 mg (1 mg Intravenous Given 04/11/23 0452)  iohexol (OMNIPAQUE) 300 MG/ML solution 100 mL (100 mLs Intravenous Contrast Given 04/11/23 0501)   5:47 AM Dr. Margo Aye accepts for admission to the hospitalist service.  This is the patient's fourth hospital admission this year for acute on chronic  pancreatitis.   PROCEDURES  Procedures   ED DIAGNOSES     ICD-10-CM   1. Acute on chronic pancreatitis (HCC)  K85.90    K86.1  Janifer Gieselman, Jonny Ruiz, MD 04/11/23 [phone]

## 2023-04-11 NOTE — ED Notes (Signed)
Called [facility] at [phone], transferred twice, nurse unavailable.

## 2023-04-11 NOTE — H&P (Signed)
History and Physical  Max Ayers ZOX:[id number] DOB: [birth year] DOA: 04/11/2023  PCP: Maud Deed, PA   Chief Complaint: Abdominal pain  HPI: Max Ayers is a 52 y.o. 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  He presented to med [facility] ED last night with upper abdominal pain radiating to his back, which feels reminiscent of his prior pancreatitis.  Lab studies were done and notable for elevated lipase.  CT abdomen pelvis confirms evidence of mild pancreatic inflammation.  He was given pain medication, IV fluids and admitted to the hospitalist service at [facility] long.  Review of Systems: Please see HPI for pertinent positives and negatives. A complete 10 system review of systems are otherwise negative.  Past Medical History:  Diagnosis Date   Acute alcoholic pancreatitis 12/14/2021   Hypertension    Pancreatitis    Tobacco use 12/12/2020   Past Surgical History:  Procedure Laterality Date   NO PAST SURGERIES      Social History:  reports that he has quit smoking. His smoking use included cigarettes and cigars. He smoked an average of 1 pack per day. He has never used smokeless tobacco. He reports that he does not currently use alcohol. He reports that he does not use drugs.   Allergies  Allergen Reactions   Yellow Jacket Venom [Bee Venom] Anaphylaxis and Hives   Gadolinium Derivatives Nausea Only    2nd time in a row. Pt received MRI contrast and immediately experienced nausea.     Family History  Problem Relation Age of Onset   Diabetes Mother    Hypertension Father    Breast cancer Maternal Grandmother    Colon cancer Neg Hx    Stomach cancer Neg Hx    Liver disease Neg Hx    Pancreatic cancer Neg Hx    Esophageal cancer Neg Hx    Rectal cancer Neg Hx      Prior to Admission medications    Medication Sig Start Date End Date Taking? Authorizing Provider  amLODipine (NORVASC) 10 MG tablet Take 1 tablet (10 mg total) by mouth daily. 03/12/23   Rhetta Mura, MD  hydrALAZINE (APRESOLINE) 100 MG tablet Take 1 tablet (100 mg total) by mouth every 8 (eight) hours. Patient taking differently: Take 100 mg by mouth 3 (three) times daily. 11/22/22   Azucena Fallen, MD  Hyprom-Naphaz-Polysorb-Zn Sulf (CLEAR EYES COMPLETE) SOLN Place 2 drops into both eyes as needed (for irritation).    [provider]  ondansetron (ZOFRAN) 4 MG tablet Take 1 tablet (4 mg total) by mouth every 8 (eight) hours as needed for nausea or vomiting. 01/04/23 01/04/24  Rolly Salter, MD  pantoprazole (PROTONIX) 40 MG tablet Take 1 tablet (40 mg total) by mouth at bedtime. Patient taking differently: Take 40 mg by mouth daily as needed (heartburn). 01/04/23 04/04/23  Rolly Salter, MD  polyethylene glycol (MIRALAX / GLYCOLAX) 17 g packet Take 17 g by mouth daily. Patient taking differently: Take 17 g by mouth daily as needed for moderate constipation. 01/05/23   Rolly Salter, MD  potassium chloride SA (KLOR-CON M) 20 MEQ tablet Take 2 tablets (40 mEq total) by mouth daily for 5 days. 02/17/23 02/22/23  Burnadette Pop, MD  TYLENOL 500 MG tablet Take 1,000 mg by mouth as needed for headache or mild pain.    [provider]    Physical  Exam: BP (!) 166/86 (BP Location: Right Arm)   Pulse 67   Temp 97.9 F (36.6 C) (Oral)   Resp 20   Ht 5\' 11"  (1.803 m)   Wt 92.1 kg   SpO2 97%   BMI 28.31 kg/m   General:  Alert, oriented, calm, in no acute distress  Eyes: EOMI, clear conjuctivae, white sclerea Neck: supple, no masses, trachea mildline  Cardiovascular: RRR, no murmurs or rubs, no peripheral edema  Respiratory: clear to auscultation bilaterally, no wheezes, no crackles  Abdomen: soft, tender, nondistended, normal bowel tones heard  Skin: dry, no rashes  Musculoskeletal: no joint  effusions, normal range of motion  Psychiatric: appropriate affect, normal speech  Neurologic: extraocular muscles intact, clear speech, moving all extremities with intact sensorium          Labs on Admission:  Basic Metabolic Panel: Recent Labs  Lab 04/11/23 0337  NA 138  K 3.5  CL 106  CO2 25  GLUCOSE 114*  BUN 17  CREATININE 1.01  CALCIUM 9.2   Liver Function Tests: Recent Labs  Lab 04/11/23 0337  AST 21  ALT 22  ALKPHOS 73  BILITOT 0.3  PROT 8.0  ALBUMIN 4.3   Recent Labs  Lab 04/11/23 0337  LIPASE 616*   No results for input(s): "AMMONIA" in the last 168 hours. CBC: Recent Labs  Lab 04/11/23 0337  WBC 4.5  NEUTROABS 1.7  HGB 15.2  HCT 44.9  MCV 85.5  PLT 222   Cardiac Enzymes: No results for input(s): "CKTOTAL", "CKMB", "CKMBINDEX", "TROPONINI" in the last 168 hours.  BNP (last 3 results) No results for input(s): "BNP" in the last 8760 hours.  ProBNP (last 3 results) No results for input(s): "PROBNP" in the last 8760 hours.  CBG: No results for input(s): "GLUCAP" in the last 168 hours.  Radiological Exams on Admission: CT ABDOMEN PELVIS W CONTRAST  Result Date: 04/11/2023 CLINICAL DATA:  52 year old male with history of abdominal pain, most severe on the right side with radiation to the back. Suspected acute pancreatitis. EXAM: CT ABDOMEN AND PELVIS WITH CONTRAST TECHNIQUE: Multidetector CT imaging of the abdomen and pelvis was performed using the standard protocol following bolus administration of intravenous contrast. RADIATION DOSE REDUCTION: This exam was performed according to the departmental dose-optimization program which includes automated exposure control, adjustment of the mA and/or kV according to patient size and/or use of iterative reconstruction technique. CONTRAST:  OMNIPAQUE IOHEXOL 300 MG/ML  SOLN COMPARISON:  CT of the abdomen and pelvis 03/08/2023. FINDINGS: Lower chest: Unremarkable. Hepatobiliary: No suspicious cystic or  solid hepatic lesions. No intra or extrahepatic biliary ductal dilatation. Gallbladder is unremarkable in appearance. 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 Today's study demonstrates some mild dilatation of the main pancreatic duct which measures up to 6 mm in the body of the pancreas. Very mild peripancreatic haziness is noted in the adjacent fat, but no significant peripancreatic fluid collections are noted at this time. Distal body and tail of the pancreas are otherwise unremarkable in appearance. Spleen: Unremarkable. Adrenals/Urinary Tract: Bilateral kidneys and adrenal glands are normal in appearance. No hydroureteronephrosis. Urinary bladder is normal in appearance. Stomach/Bowel: The appearance of the stomach is normal. There is no  pathologic dilatation of small bowel or colon. Normal appendix. Vascular/Lymphatic: Atherosclerosis in the abdominal aorta and pelvic vasculature, without evidence of aneurysm or dissection. No lymphadenopathy noted in the abdomen or pelvis. Reproductive: Prostate gland and seminal vesicles are unremarkable in appearance. Other: No significant volume of ascites.  No pneumoperitoneum. Musculoskeletal: There are no aggressive appearing lytic or blastic lesions noted in the visualized portions of the skeleton. IMPRESSION: 1. Chronic cystic lesion in the head of the pancreas similar to numerous prior examinations dating back to 08/16/2020, presumably a chronic pancreatic pseudocyst. Today's study demonstrates minimal dilatation of the main pancreatic duct in the region of the proximal body of the pancreas, along with some very subtle inflammatory changes in the peripancreatic  fat, which may suggest very mild acute pancreatitis. Electronically Signed   By: Trudie Reed M.D.   On: 04/11/2023 06:01    Assessment/Plan Principal Problem:   Acute pancreatitis -recurrent pancreatitis in the setting of known pancreatic pseudocyst, unfortunately he needs to be pancreatitis free for about a month, so he can have the pseudocyst drained as an outpatient with Dr. Dulce Sellar. -Inpatient admission to telemetry -Strict n.p.o. except for sips with meds -Aggressive fluid hydration, pain and nausea control Active Problems:   Benign essential HTN-continue home amlodipine, with as needed metoprolol   Pancreatic pseudocyst   Overweight with a BMI of 28.59 kg/m   HLD (hyperlipidemia)  DVT prophylaxis: Lovenox     Code Status: Full Code  Consults called: None  Admission status: The appropriate patient status for this patient is INPATIENT. Inpatient status is judged to be reasonable and necessary in order to provide the required intensity of service to ensure the patient's safety. The patient's presenting symptoms, physical exam findings, and initial radiographic and laboratory data in the context of their chronic comorbidities is felt to place them at high risk for further clinical deterioration. Furthermore, it is not anticipated that the patient will be medically stable for discharge from the hospital within 2 midnights of admission.    I certify that at the point of admission it is my clinical judgment that the patient will require inpatient hospital care spanning beyond 2 midnights from the point of admission due to high intensity of service, high risk for further deterioration and high frequency of surveillance required   Time spent: 48 minutes  Jadan Rouillard Sharlette Dense MD [facility] Pager [phone]  If 7PM-7AM, please contact night-coverage [email or web address] Password [facility]  04/11/2023, 9:14 AM

## 2023-04-12 DIAGNOSIS — K859 Acute pancreatitis without necrosis or infection, unspecified: Secondary | ICD-10-CM | POA: Diagnosis not present

## 2023-04-12 LAB — CBC
HCT: 40.3 % (ref 39.0–52.0)
Hemoglobin: 13.4 g/dL (ref 13.0–17.0)
MCH: 29.3 pg (ref 26.0–34.0)
MCHC: 33.3 g/dL (ref 30.0–36.0)
MCV: 88 fL (ref 80.0–100.0)
Platelets: 186 10*3/uL (ref 150–400)
RBC: 4.58 MIL/uL (ref 4.22–5.81)
RDW: 12.9 % (ref 11.5–15.5)
WBC: 4.5 10*3/uL (ref 4.0–10.5)
nRBC: 0 % (ref 0.0–0.2)

## 2023-04-12 LAB — COMPREHENSIVE METABOLIC PANEL
ALT: 19 U/L (ref 0–44)
AST: 18 U/L (ref 15–41)
Albumin: 3.8 g/dL (ref 3.5–5.0)
Alkaline Phosphatase: 59 U/L (ref 38–126)
Anion gap: 6 (ref 5–15)
BUN: 9 mg/dL (ref 6–20)
CO2: 26 mmol/L (ref 22–32)
Calcium: 8.3 mg/dL — ABNORMAL LOW (ref 8.9–10.3)
Chloride: 103 mmol/L (ref 98–111)
Creatinine, Ser: 0.88 mg/dL (ref 0.61–1.24)
GFR, Estimated: >60 mL/min (ref >60)
Glucose, Bld: 127 mg/dL — ABNORMAL HIGH (ref 70–99)
Potassium: 3.1 mmol/L — ABNORMAL LOW (ref 3.5–5.1)
Sodium: 135 mmol/L (ref 135–145)
Total Bilirubin: 0.6 mg/dL (ref 0.3–1.2)
Total Protein: 6.9 g/dL (ref 6.5–8.1)

## 2023-04-12 MED ORDER — POTASSIUM CHLORIDE IN NACL 40-0.9 MEQ/L-% IV SOLN
INTRAVENOUS | Status: DC
  Filled 2023-04-12 (×11): qty 1000

## 2023-04-12 MED ORDER — OXYCODONE-ACETAMINOPHEN 5-325 MG PO TABS
1.0000 | ORAL_TABLET | ORAL | Status: DC | PRN
  Administered 2023-04-12 – 2023-04-15 (×11): 2 via ORAL
  Filled 2023-04-12 (×11): qty 2

## 2023-04-12 NOTE — Progress Notes (Signed)
[facility] PROGRESS NOTE  Max Ayers (DOB: [birth year]) FAO:[id number] PCP: Maud Deed, PA Outpatient Specialists: GI, Dr. Dulce Sellar  Brief Narrative: Max Ayers is a 52 y.o. male with a history of recurrent pancreatitis with pseudocyst, alcohol abuse, HTN who presented to the ED on 04/11/2023 with epigastric abdominal pain radiating to the back reminiscent of prior acute pancreatitis. Lipase was elevated and CT abd/pelvis confirmed mild pancreatic inflammation. He was admitted for recurrent pancreatitis, and is advancing to oral analgesics and clear liquid diet 5/1.   Subjective: Pain throughout abdomen, worst in upper portions, same character but overall less severe than admission. He's quite hungry. Gets a headache, he believes, from oral dilaudid. Spouse at bedside.  Objective: BP (!) 145/80 (BP Location: Right Arm)   Pulse 74   Temp 98.2 F (36.8 C) (Oral)   Resp 16   Ht 5\' 11"  (1.803 m)   Wt 92.1 kg   SpO2 97%   BMI 28.31 kg/m   Gen: No distress Pulm: Clear, nonlabored  CV: RRR, no MRG GI: Soft, tender throughout most in epigastrium without distention or guarding, +BS. Neuro: Alert and oriented. No new focal deficits. Ext: Warm, no deformities. 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   - Continue IVF, modify according to metabolic profile. Can decrease rate while starting clear liquids.  - Continue IV antiemetic, IV analgesic prn. Change oral analgesic to percocet which patient has tolerated previously, in hopes to using this primarily - Stable appearance on CT 4/30 of chronic pancreatic pseudocyst (1.8 x 1.3 x 1.5cm). will follow up with GI once pancreatitis resolved for consideration of drainage.  - Continue EtOH, smoking cessation  HTN:  - Continue norvasc, hydralazine  GERD:  - Continue PPI  Overweight: Noted  Body mass index is 28.31 kg/m.   [name], MD [facility] [email or web address] 04/12/2023, 1:27 PM

## 2023-04-12 NOTE — Progress Notes (Signed)
  Transition of Care [facility]) Screening Note   Patient Details  Name: Kyden Potash Date of Birth: [birth year]   [facility] of [facility]) CM/SW Contact:    Lanier Clam, RN Phone Number: 04/12/2023, 10:41 AM    Transition of Care Department [facility]) has reviewed patient and no TOC needs have been identified at this time. We will continue to monitor patient advancement through interdisciplinary progression rounds. If new patient transition needs arise, please place a TOC consult.

## 2023-04-12 NOTE — Plan of Care (Signed)
  Problem: Education: Goal: Knowledge of General Education information will improve Description: Including pain rating scale, medication(s)/side effects and non-pharmacologic comfort measures Outcome: Progressing   Problem: Pain Managment: Goal: General experience of comfort will improve Outcome: Progressing   

## 2023-04-13 DIAGNOSIS — K859 Acute pancreatitis without necrosis or infection, unspecified: Secondary | ICD-10-CM | POA: Diagnosis not present

## 2023-04-13 LAB — BASIC METABOLIC PANEL
Anion gap: 8 (ref 5–15)
BUN: 6 mg/dL (ref 6–20)
CO2: 24 mmol/L (ref 22–32)
Calcium: 9.1 mg/dL (ref 8.9–10.3)
Chloride: 105 mmol/L (ref 98–111)
Creatinine, Ser: 0.9 mg/dL (ref 0.61–1.24)
GFR, Estimated: >60 mL/min (ref >60)
Glucose, Bld: 96 mg/dL (ref 70–99)
Potassium: 3.7 mmol/L (ref 3.5–5.1)
Sodium: 137 mmol/L (ref 135–145)

## 2023-04-13 NOTE — Progress Notes (Signed)
[facility] PROGRESS NOTE  Max Ayers (DOB: 12/10/1971) WUJ:[id number] PCP: Max Deed, PA Outpatient Specialists: GI, Dr. Dulce Ayers  Brief Narrative: Max Ayers is a 52 y.o. male with a history of recurrent pancreatitis with pseudocyst, alcohol abuse, HTN who presented to the ED on 04/11/2023 with epigastric abdominal pain radiating to the back reminiscent of prior acute pancreatitis. Lipase was elevated and CT abd/pelvis confirmed mild pancreatic inflammation. He was admitted for recurrent pancreatitis, and is advancing to oral analgesics and clear liquid diet 5/1.   Subjective: Pain stable, still using IV dilaudid frequently, doesn't want to try to advance diet.  Objective: BP (!) 143/90 (BP Location: Right Arm)   Pulse 64   Temp 98.1 F (36.7 C) (Oral)   Resp 16   Ht 5\' 11"  (1.803 m)   Wt 92.1 kg   SpO2 98%   BMI 28.31 kg/m   Gen: No distress Pulm: Clear, nonlabored  CV: RRR, no MRG or edema GI: Soft, ++TTP mostly in epigastrium, +BS Neuro: Alert and oriented. No new focal deficits. Ext: Warm, no deformities. 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 Abd exam stable. - Continue clear liquid diet. - Continue IVF, metabolic profile essentially wnl. - Continue IV antiemetic, IV dilaudid prn. Continue percocet prn as well in effort to transition to enteral nutrition and medications. - Stable appearance on CT 4/30 of chronic pancreatic pseudocyst (1.8 x 1.3 x 1.5cm). will follow up with GI once pancreatitis resolved for consideration of drainage.  - Continue EtOH, smoking cessation  HTN:  - Continue norvasc, hydralazine  GERD:  - Continue PPI  Overweight: Noted Body mass index is 28.31 kg/m.   Max Nine, MD [facility] [email or web address] 04/13/2023, 11:48 AM

## 2023-04-13 NOTE — Plan of Care (Signed)

## 2023-04-14 DIAGNOSIS — K852 Alcohol induced acute pancreatitis without necrosis or infection: Secondary | ICD-10-CM | POA: Diagnosis not present

## 2023-04-14 NOTE — Plan of Care (Signed)
  Problem: Education: Goal: Knowledge of General Education information will improve Description: Including pain rating scale, medication(s)/side effects and non-pharmacologic comfort measures Outcome: Progressing   Problem: Activity: Goal: Risk for activity intolerance will decrease Outcome: Progressing   

## 2023-04-14 NOTE — Progress Notes (Signed)
PROGRESS NOTE    Max Ayers  ZOX:[id number] DOB: 12-21-1970 DOA: 04/11/2023 PCP: Maud Deed, PA   Brief Narrative:  Max Ayers is a 52 y.o. male with a history of recurrent pancreatitis with pseudocyst, alcohol abuse, HTN who presented to the ED on 04/11/2023 with epigastric abdominal pain radiating to the back reminiscent of prior acute pancreatitis. Lipase was elevated and CT abd/pelvis confirmed mild pancreatic inflammation.   He was admitted for recurrent pancreatitis (just admitted last month for similar episode) -pain is improving, will advance patient's diet, once able to tolerate p.o. safely will discuss discharge home.  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   - Continue IVF, modify according to metabolic profile. Can decrease rate while advancing diet -Transition to p.o. antiemetics and analgesics as possible - Stable appearance on CT 4/30 of chronic pancreatic pseudocyst (1.8 x 1.3 x 1.5cm). will follow up with GI once pancreatitis resolved for consideration of drainage.  - Continue to discuss EtOH, smoking cessation   HTN:  - Continue norvasc, hydralazine   GERD:  - Continue PPI   Overweight: Noted Body mass index is 28.31 kg/m.   DVT prophylaxis: Lovenox Code Status: Full Family Communication: Wife at bedside  Status is: Inpatient  Dispo: The patient is from: Home              Anticipated d/c is to: Home              Anticipated d/c date is: 24 to 48 hours              Patient currently not medically stable for discharge  Consultants:  None  Procedures:  None  Antimicrobials:  None indicated  Subjective: No acute issues or events overnight, advancing diet slowly as tolerated no  overt episodes of nausea vomiting or intractable pain over the past 24 hours  Objective: Vitals:   04/13/23 1527 04/13/23 2026 04/13/23 2027 04/14/23 0505  BP: (!) 144/90 (!) 135/91 (!) 135/91 (!) 152/91  Pulse: 78  67 81  Resp: 18  16 17   Temp: 98.1 F (36.7 C)  98.2 F (36.8 C) 98 F (36.7 C)  TempSrc: Oral  Oral Oral  SpO2: 97%  97% 100%  Weight:      Height:        Intake/Output Summary (Last 24 hours) at 04/14/2023 0749 Last data filed at 04/13/2023 1830 Gross per 24 hour  Intake 1020 ml  Output --  Net 1020 ml   Filed Weights   04/11/23 0321  Weight: 92.1 kg    Examination:  General:  Pleasantly resting in bed, No acute distress. HEENT:  Normocephalic atraumatic.  Sclerae nonicteric, noninjected.  Extraocular movements intact bilaterally. Neck:  Without mass or deformity.  Trachea is midline. Lungs:  Clear to auscultate bilaterally without rhonchi, wheeze, or rales. Heart:  Regular rate and rhythm.  Without murmurs, rubs, or gallops. Abdomen: Diffusely tender without rebound or guarding Extremities: Without cyanosis, clubbing, edema, or obvious deformity.   Data Reviewed: I have personally reviewed following labs and imaging studies  CBC: Recent Labs  Lab 04/11/23 0337 04/11/23 0926 04/12/23 0510  WBC 4.5 4.6 4.5  NEUTROABS 1.7  --   --   HGB 15.2 15.5 13.4  HCT 44.9 47.5 40.3  MCV 85.5 89.0 88.0  PLT 222 220 186   Basic Metabolic Panel: Recent Labs  Lab 04/11/23 0337 04/11/23 0926 04/12/23 0510 04/13/23 0537  NA 138  --  135 137  K 3.5  --  3.1* 3.7  CL 106  --  103 105  CO2 25  --  26 24  GLUCOSE 114*  --  127* 96  BUN 17  --  9 6  CREATININE 1.01 0.96 0.88 0.90  CALCIUM 9.2  --  8.3* 9.1   GFR: Estimated Creatinine Clearance: 113.9 mL/min (by C-G formula based on SCr of 0.9 mg/dL). Liver Function Tests: Recent Labs  Lab 04/11/23 0337 04/12/23 0510  AST 21 18  ALT 22 19  ALKPHOS 73 59  BILITOT 0.3 0.6  PROT 8.0 6.9  ALBUMIN 4.3  3.8   Recent Labs  Lab 04/11/23 0337  LIPASE 616*   No results found for this or any previous visit (from the past 240 hour(s)).   Radiology Studies: No results found.   Scheduled Meds:  amLODipine  10 mg Oral Daily   docusate sodium  100 mg Oral BID   enoxaparin (LOVENOX) injection  40 mg Subcutaneous Q24H   hydrALAZINE  100 mg Oral TID   pantoprazole  40 mg Oral QHS   Continuous Infusions:  0.9 % NaCl with KCl 40 mEq / L 100 mL/hr at 04/14/23 0607     LOS: 3 days   Time spent:  Azucena Fallen, DO [facility]  If 7PM-7AM, please contact night-coverage [email or web address]  04/14/2023, 7:49 AM

## 2023-04-15 DIAGNOSIS — E663 Overweight: Secondary | ICD-10-CM

## 2023-04-15 DIAGNOSIS — I10 Essential (primary) hypertension: Secondary | ICD-10-CM | POA: Diagnosis not present

## 2023-04-15 DIAGNOSIS — E782 Mixed hyperlipidemia: Secondary | ICD-10-CM

## 2023-04-15 DIAGNOSIS — K863 Pseudocyst of pancreas: Secondary | ICD-10-CM

## 2023-04-15 DIAGNOSIS — K852 Alcohol induced acute pancreatitis without necrosis or infection: Secondary | ICD-10-CM | POA: Diagnosis not present

## 2023-04-15 MED ORDER — DOCUSATE SODIUM 100 MG PO CAPS: 100.0000 mg | ORAL_CAPSULE | Freq: Two times a day (BID) | ORAL | 0 refills | Status: DC

## 2023-04-15 MED ORDER — ONDANSETRON HCL 4 MG PO TABS: 4.0000 mg | ORAL_TABLET | Freq: Four times a day (QID) | ORAL | 0 refills | Status: DC | PRN

## 2023-04-15 MED ORDER — AMLODIPINE BESYLATE 10 MG PO TABS: 10.0000 mg | ORAL_TABLET | Freq: Every day | ORAL | 0 refills | Status: DC

## 2023-04-15 MED ORDER — PANTOPRAZOLE SODIUM 40 MG PO TBEC: 40.0000 mg | DELAYED_RELEASE_TABLET | Freq: Two times a day (BID) | ORAL | 0 refills | Status: AC | PRN

## 2023-04-15 MED ORDER — HYDRALAZINE HCL 100 MG PO TABS: 100.0000 mg | ORAL_TABLET | Freq: Three times a day (TID) | ORAL | 0 refills | Status: DC

## 2023-04-15 MED ORDER — POLYETHYLENE GLYCOL 3350 17 G PO PACK: 17.0000 g | PACK | Freq: Two times a day (BID) | ORAL | 0 refills | Status: DC

## 2023-04-15 MED ORDER — OXYCODONE-ACETAMINOPHEN 5-325 MG PO TABS: 1.0000 | ORAL_TABLET | ORAL | 0 refills | Status: DC | PRN

## 2023-04-15 NOTE — Plan of Care (Signed)

## 2023-04-15 NOTE — Discharge Summary (Signed)
Physician Discharge Summary  Max Ayers ZOX:[id number] DOB: 07-30-1971 DOA: 04/11/2023  PCP: Maud Deed, PA  Admit date: 04/11/2023 Discharge date: 04/15/2023  Admitted From: Home Disposition: Home  Recommendations for Outpatient Follow-up:  Follow up with PCP in 1-2 weeks Follow-up with GI as scheduled  Home Health: None Equipment/Devices: None  Discharge Condition: Stable CODE STATUS: Full Diet recommendation: Low-fat diet  Brief/Interim Summary: Max Ayers is a 52 y.o. male with a history of recurrent pancreatitis with pseudocyst, alcohol abuse, HTN who presented to the ED on 04/11/2023 with epigastric abdominal pain radiating to the back reminiscent of prior acute pancreatitis. Lipase was elevated and CT abd/pelvis confirmed mild pancreatic inflammation.    He was admitted for recurrent pancreatitis (just admitted last month for similar episode) -pain is improving, patient currently tolerating p.o. quite well otherwise symptoms have resolved.  Stable for discharge.  Follow-up with PCP and GI as scheduled previously.  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   - Stable appearance on CT 4/30 of chronic pancreatic pseudocyst (1.8 x 1.3 x 1.5cm). will follow up with GI once pancreatitis resolved for consideration of drainage (Dr. Ewing Schlein)   HTN:  - Continue norvasc, hydralazine   GERD:  - Continue PPI   Overweight: Noted Body mass index is 28.31 kg/m.   Discharge Instructions   Allergies as of 04/15/2023       Reactions   Yellow Jacket Venom [bee Venom] Anaphylaxis, Hives   Gadolinium Derivatives Nausea Only   2nd time in a row. Pt received MRI contrast and immediately experienced nausea.          Medication List     STOP taking these medications    potassium chloride SA 20 MEQ tablet Commonly known as: KLOR-CON M   TYLENOL 500 MG tablet Generic drug: acetaminophen       TAKE these medications    amLODipine 10 MG tablet Commonly known as: NORVASC Take 1 tablet (10 mg total) by mouth daily.   Clear Eyes Complete Soln Place 1 drop into both eyes as needed (for irritation).   docusate sodium 100 MG capsule Commonly known as: COLACE Take 1 capsule (100 mg total) by mouth 2 (two) times daily.   EPINEPHrine 0.3 mg/0.3 mL Soaj injection Commonly known as: EPI-PEN Inject 0.3 mg into the muscle as needed for anaphylaxis.   hydrALAZINE 100 MG tablet Commonly known as: APRESOLINE Take 1 tablet (100 mg total) by mouth 3 (three) times daily. What changed: when to take this   ondansetron 4 MG tablet Commonly known as: ZOFRAN Take 1 tablet (4 mg total) by mouth every 6 (six) hours as needed for nausea. What changed:  when to take this reasons to take this   oxyCODONE-acetaminophen 5-325 MG tablet Commonly known as: PERCOCET/ROXICET Take 1-2 tablets by mouth every 4 (four) hours as needed for moderate pain or severe pain.   pantoprazole 40 MG tablet Commonly known as: PROTONIX Take 1 tablet (40 mg total) by mouth 2 (two) times daily as needed (heartburn).   polyethylene glycol 17 g packet Commonly known as: MIRALAX / GLYCOLAX Take 17 g by mouth 2 (two) times daily. What changed: when to take this        Allergies  Allergen Reactions  Yellow Jacket Venom [Bee Venom] Anaphylaxis and Hives   Gadolinium Derivatives Nausea Only    2nd time in a row. Pt received MRI contrast and immediately experienced nausea.     Consultations: None  Procedures/Studies: CT ABDOMEN PELVIS W CONTRAST  Result Date: 04/11/2023 CLINICAL DATA:  52 year old male with history of abdominal pain, most severe on the right side with radiation to the back. Suspected acute  pancreatitis. EXAM: CT ABDOMEN AND PELVIS WITH CONTRAST TECHNIQUE: Multidetector CT imaging of the abdomen and pelvis was performed using the standard protocol following bolus administration of intravenous contrast. RADIATION DOSE REDUCTION: This exam was performed according to the departmental dose-optimization program which includes automated exposure control, adjustment of the mA and/or kV according to patient size and/or use of iterative reconstruction technique. CONTRAST:  OMNIPAQUE IOHEXOL 300 MG/ML  SOLN COMPARISON:  CT of the abdomen and pelvis 03/08/2023. FINDINGS: Lower chest: Unremarkable. Hepatobiliary: No suspicious cystic or solid hepatic lesions. No intra or extrahepatic biliary ductal dilatation. Gallbladder is unremarkable in appearance. 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 Today's study demonstrates some mild dilatation of the main pancreatic duct which measures up to 6 mm in the body of the pancreas. Very mild peripancreatic haziness is noted in the adjacent fat, but no significant peripancreatic fluid collections are noted at this time. Distal body and tail of the pancreas are otherwise unremarkable in appearance. Spleen: Unremarkable. Adrenals/Urinary Tract: Bilateral kidneys and adrenal glands are normal in appearance. No hydroureteronephrosis. Urinary bladder is normal in appearance. Stomach/Bowel: The appearance of the stomach is normal. There is no pathologic dilatation of small bowel or colon. Normal appendix. Vascular/Lymphatic: Atherosclerosis in the abdominal aorta and pelvic vasculature, without evidence of aneurysm or dissection. No lymphadenopathy noted in the abdomen or  pelvis. Reproductive: Prostate gland and seminal vesicles are unremarkable in appearance. Other: No significant volume of ascites.  No pneumoperitoneum. Musculoskeletal: There are no aggressive appearing lytic or blastic lesions noted in the visualized portions of the skeleton. IMPRESSION: 1. Chronic cystic lesion in the head of the pancreas similar to numerous prior examinations dating back to 08/16/2020, presumably a chronic pancreatic pseudocyst. Today's study demonstrates minimal dilatation of the main pancreatic duct in the region of the proximal body of the pancreas, along with some very subtle inflammatory changes in the peripancreatic fat, which may suggest very mild acute pancreatitis. Electronically Signed   By: Trudie Reed M.D.   On: 04/11/2023 06:01     Subjective: No acute issues or events overnight tolerating breakfast without difficulty denies nausea vomiting diarrhea constipation headache fevers chills chest pain shortness of breath.   Discharge Exam: Vitals:   04/14/23 2106 04/15/23 0514  BP: (!) 143/82 (!) 168/83  Pulse: 66 69  Resp: 18 18  Temp: 97.8 F (36.6 C) 97.8 F (36.6 C)  SpO2: 97% 96%   Vitals:   04/14/23 0505 04/14/23 1414 04/14/23 2106 04/15/23 0514  BP: (!) 152/91 (!) 156/83 (!) 143/82 (!) 168/83  Pulse: 81 65 66 69  Resp: 17 20 18 18   Temp: 98 F (36.7 C) 98.5 F (36.9 C) 97.8 F (36.6 C) 97.8 F (36.6 C)  TempSrc: Oral Oral Oral Oral  SpO2: 100% 97% 97% 96%  Weight:  Height:        General: Pt is alert, awake, not in acute distress Cardiovascular: RRR, S1/S2 +, no rubs, no gallops Respiratory: CTA bilaterally, no wheezing, no rhonchi Abdominal: Soft, NT, ND, bowel sounds + Extremities: no edema, no cyanosis    The results of significant diagnostics from this hospitalization (including imaging, microbiology, ancillary and laboratory) are listed below for reference.     Microbiology: No results found for this or any previous visit  (from the past 240 hour(s)).   Labs: BNP (last 3 results) No results for input(s): "BNP" in the last 8760 hours. Basic Metabolic Panel: Recent Labs  Lab 04/11/23 0337 04/11/23 0926 04/12/23 0510 04/13/23 0537  NA 138  --  135 137  K 3.5  --  3.1* 3.7  CL 106  --  103 105  CO2 25  --  26 24  GLUCOSE 114*  --  127* 96  BUN 17  --  9 6  CREATININE 1.01 0.96 0.88 0.90  CALCIUM 9.2  --  8.3* 9.1   Liver Function Tests: Recent Labs  Lab 04/11/23 0337 04/12/23 0510  AST 21 18  ALT 22 19  ALKPHOS 73 59  BILITOT 0.3 0.6  PROT 8.0 6.9  ALBUMIN 4.3 3.8   Recent Labs  Lab 04/11/23 0337  LIPASE 616*   No results for input(s): "AMMONIA" in the last 168 hours. CBC: Recent Labs  Lab 04/11/23 0337 04/11/23 0926 04/12/23 0510  WBC 4.5 4.6 4.5  NEUTROABS 1.7  --   --   HGB 15.2 15.5 13.4  HCT 44.9 47.5 40.3  MCV 85.5 89.0 88.0  PLT 222 220 186   Cardiac Enzymes: No results for input(s): "CKTOTAL", "CKMB", "CKMBINDEX", "TROPONINI" in the last 168 hours. BNP: Invalid input(s): "POCBNP" CBG: No results for input(s): "GLUCAP" in the last 168 hours. D-Dimer No results for input(s): "DDIMER" in the last 72 hours. Hgb A1c No results for input(s): "HGBA1C" in the last 72 hours. Lipid Profile No results for input(s): "CHOL", "HDL", "LDLCALC", "TRIG", "CHOLHDL", "LDLDIRECT" in the last 72 hours. Thyroid function studies No results for input(s): "TSH", "T4TOTAL", "T3FREE", "THYROIDAB" in the last 72 hours.  Invalid input(s): "FREET3" Anemia work up No results for input(s): "VITAMINB12", "FOLATE", "FERRITIN", "TIBC", "IRON", "RETICCTPCT" in the last 72 hours. Urinalysis    Component Value Date/Time   COLORURINE YELLOW 02/12/2023 1312   APPEARANCEUR CLEAR 02/12/2023 1312   LABSPEC 1.020 02/12/2023 1312   PHURINE 7.0 02/12/2023 1312   GLUCOSEU NEGATIVE 02/12/2023 1312   HGBUR NEGATIVE 02/12/2023 1312   BILIRUBINUR NEGATIVE 02/12/2023 1312   KETONESUR NEGATIVE 02/12/2023  1312   PROTEINUR NEGATIVE 02/12/2023 1312   UROBILINOGEN 0.2 10/27/2011 0957   NITRITE NEGATIVE 02/12/2023 1312   LEUKOCYTESUR NEGATIVE 02/12/2023 1312   Sepsis Labs Recent Labs  Lab 04/11/23 0337 04/11/23 0926 04/12/23 0510  WBC 4.5 4.6 4.5   Microbiology No results found for this or any previous visit (from the past 240 hour(s)).   Time coordinating discharge: Over 30 minutes  SIGNED:   Azucena Fallen, DO [facility] 04/15/2023, 1:14 PM Pager   If 7PM-7AM, please contact night-coverage [email or web address]

## 2023-04-15 NOTE — Progress Notes (Signed)
Discharge instructions given. Pt verbalized understanding. Still waiting for transportation arrival.

## 2023-04-28 ENCOUNTER — Other Ambulatory Visit: Payer: Self-pay | Admitting: Gastroenterology

## 2023-05-02 ENCOUNTER — Emergency Department (HOSPITAL_BASED_OUTPATIENT_CLINIC_OR_DEPARTMENT_OTHER)
Admission: EM | Admit: 2023-05-02 | Discharge: 2023-05-02 | Disposition: A | Discharge status: Home / Self Care | Payer: Medicaid Other | Attending: Emergency Medicine | Admitting: Emergency Medicine

## 2023-05-02 ENCOUNTER — Other Ambulatory Visit: Payer: Self-pay

## 2023-05-02 ENCOUNTER — Emergency Department (HOSPITAL_BASED_OUTPATIENT_CLINIC_OR_DEPARTMENT_OTHER): Payer: Medicaid Other

## 2023-05-02 ENCOUNTER — Encounter (HOSPITAL_BASED_OUTPATIENT_CLINIC_OR_DEPARTMENT_OTHER): Payer: Self-pay

## 2023-05-02 DIAGNOSIS — R739 Hyperglycemia, unspecified: Secondary | ICD-10-CM | POA: Insufficient documentation

## 2023-05-02 DIAGNOSIS — I10 Essential (primary) hypertension: Secondary | ICD-10-CM | POA: Diagnosis not present

## 2023-05-02 DIAGNOSIS — Z720 Tobacco use: Secondary | ICD-10-CM | POA: Diagnosis not present

## 2023-05-02 DIAGNOSIS — K859 Acute pancreatitis without necrosis or infection, unspecified: Secondary | ICD-10-CM | POA: Insufficient documentation

## 2023-05-02 DIAGNOSIS — Z79899 Other long term (current) drug therapy: Secondary | ICD-10-CM | POA: Diagnosis not present

## 2023-05-02 DIAGNOSIS — R1013 Epigastric pain: Secondary | ICD-10-CM | POA: Diagnosis present

## 2023-05-02 LAB — CBC
HCT: 39.6 % (ref 39.0–52.0)
Hemoglobin: 13.4 g/dL (ref 13.0–17.0)
MCH: 29.1 pg (ref 26.0–34.0)
MCHC: 33.8 g/dL (ref 30.0–36.0)
MCV: 85.9 fL (ref 80.0–100.0)
Platelets: 233 10*3/uL (ref 150–400)
RBC: 4.61 MIL/uL (ref 4.22–5.81)
RDW: 12.8 % (ref 11.5–15.5)
WBC: 4.6 10*3/uL (ref 4.0–10.5)
nRBC: 0 % (ref 0.0–0.2)

## 2023-05-02 LAB — HEPATIC FUNCTION PANEL
ALT: 14 U/L (ref 0–44)
AST: 18 U/L (ref 15–41)
Albumin: 3.7 g/dL (ref 3.5–5.0)
Alkaline Phosphatase: 63 U/L (ref 38–126)
Bilirubin, Direct: <0.1 mg/dL (ref 0.0–0.2)
Total Bilirubin: 0.4 mg/dL (ref 0.3–1.2)
Total Protein: 7.1 g/dL (ref 6.5–8.1)

## 2023-05-02 LAB — BASIC METABOLIC PANEL
Anion gap: 8 (ref 5–15)
BUN: 13 mg/dL (ref 6–20)
CO2: 23 mmol/L (ref 22–32)
Calcium: 8.4 mg/dL — ABNORMAL LOW (ref 8.9–10.3)
Chloride: 104 mmol/L (ref 98–111)
Creatinine, Ser: 0.95 mg/dL (ref 0.61–1.24)
GFR, Estimated: >60 mL/min (ref >60)
Glucose, Bld: 142 mg/dL — ABNORMAL HIGH (ref 70–99)
Potassium: 3.4 mmol/L — ABNORMAL LOW (ref 3.5–5.1)
Sodium: 135 mmol/L (ref 135–145)

## 2023-05-02 LAB — LIPASE, BLOOD: Lipase: 41 U/L (ref 11–51)

## 2023-05-02 LAB — TROPONIN I (HIGH SENSITIVITY): Troponin I (High Sensitivity): 4 ng/L (ref <18)

## 2023-05-02 MED ORDER — ONDANSETRON 4 MG PO TBDP: 4.0000 mg | ORAL_TABLET | Freq: Three times a day (TID) | ORAL | 0 refills | Status: DC | PRN

## 2023-05-02 MED ORDER — FENTANYL CITRATE PF 50 MCG/ML IJ SOSY
50.0000 ug | PREFILLED_SYRINGE | Freq: Once | INTRAMUSCULAR | Status: AC
  Administered 2023-05-02: 50 ug via INTRAVENOUS
  Filled 2023-05-02: qty 1

## 2023-05-02 MED ORDER — HYDROMORPHONE HCL 1 MG/ML IJ SOLN
INTRAMUSCULAR | Status: AC
  Filled 2023-05-02: qty 1

## 2023-05-02 MED ORDER — OXYCODONE HCL 5 MG PO TABS: 5.0000 mg | ORAL_TABLET | ORAL | 0 refills | Status: DC | PRN

## 2023-05-02 MED ORDER — IOHEXOL 300 MG/ML  SOLN
100.0000 mL | Freq: Once | INTRAMUSCULAR | Status: AC | PRN
  Administered 2023-05-02: 100 mL via INTRAVENOUS

## 2023-05-02 MED ORDER — ONDANSETRON HCL 4 MG/2ML IJ SOLN
4.0000 mg | Freq: Once | INTRAMUSCULAR | Status: AC
  Administered 2023-05-02: 4 mg via INTRAVENOUS
  Filled 2023-05-02: qty 2

## 2023-05-02 MED ORDER — HYDROMORPHONE HCL 1 MG/ML IJ SOLN
0.5000 mg | Freq: Once | INTRAMUSCULAR | Status: AC
  Administered 2023-05-02: 0.5 mg via INTRAVENOUS

## 2023-05-02 MED ORDER — SODIUM CHLORIDE 0.9 % IV BOLUS
1000.0000 mL | Freq: Once | INTRAVENOUS | Status: AC
  Administered 2023-05-02: 1000 mL via INTRAVENOUS

## 2023-05-02 NOTE — ED Provider Notes (Signed)
[facility] Provider Note   CSN: [id number] Arrival date & time: 05/02/23  1749     History {Add pertinent medical, surgical, social history, OB history to HPI:1} Chief Complaint  Patient presents with   Chest Pain    Max Ayers is a 52 y.o. male presents emergency department with chief complaint of epigastric and right upper quadrant abdominal pain.  He states that it began yesterday and has been progressively worsening.  Colicky in nature radiating to his back.  He states that he has had nausea without vomiting.Marland Kitchen  He states this feels similar to previous pancreatitis attacks however its on the other side.  He states that he quit drinking all alcohol 2 years ago.  He denies chest pain but does state that it hurts in his right upper quadrant when he takes a deep breath.  No previous abdominal surgeries.   Chest Pain      Home Medications Prior to Admission medications   Medication Sig Start Date End Date Taking? Authorizing Provider  amLODipine (NORVASC) 10 MG tablet Take 1 tablet (10 mg total) by mouth daily. 04/15/23   Azucena Fallen, MD  docusate sodium (COLACE) 100 MG capsule Take 1 capsule (100 mg total) by mouth 2 (two) times daily. 04/15/23   Azucena Fallen, MD  EPINEPHrine 0.3 mg/0.3 mL IJ SOAJ injection Inject 0.3 mg into the muscle as needed for anaphylaxis. 06/11/19   [provider]  hydrALAZINE (APRESOLINE) 100 MG tablet Take 1 tablet (100 mg total) by mouth 3 (three) times daily. 04/15/23   Azucena Fallen, MD  Hyprom-Naphaz-Polysorb-Zn Sulf (CLEAR EYES COMPLETE) SOLN Place 1 drop into both eyes as needed (for irritation).    [provider]  ondansetron (ZOFRAN) 4 MG tablet Take 1 tablet (4 mg total) by mouth every 6 (six) hours as needed for nausea. 04/15/23   Azucena Fallen, MD  oxyCODONE-acetaminophen (PERCOCET/ROXICET) 5-325 MG tablet Take 1-2 tablets by mouth every 4 (four) hours as  needed for moderate pain or severe pain. 04/15/23   Azucena Fallen, MD  pantoprazole (PROTONIX) 40 MG tablet Take 1 tablet (40 mg total) by mouth 2 (two) times daily as needed (heartburn). 04/15/23 07/14/23  Azucena Fallen, MD  polyethylene glycol (MIRALAX / GLYCOLAX) 17 g packet Take 17 g by mouth 2 (two) times daily. 04/15/23   Azucena Fallen, MD      Allergies    Yellow jacket venom [bee venom] and Gadolinium derivatives    Review of Systems   Review of Systems  Cardiovascular:  Positive for chest pain.    Physical Exam Updated Vital Signs BP (!) 150/75   Pulse (!) 59   Temp 98 F (36.7 C)   Resp 15   Ht 5\' 11"  (1.803 m)   Wt 90.7 kg   SpO2 98%   BMI 27.89 kg/m  Physical Exam Vitals and nursing note reviewed.  Constitutional:      General: He is not in acute distress.    Appearance: He is well-developed. He is not diaphoretic.  HENT:     Head: Normocephalic and atraumatic.  Eyes:     General: No scleral icterus.    Conjunctiva/sclera: Conjunctivae normal.  Cardiovascular:     Rate and Rhythm: Normal rate and regular rhythm.     Heart sounds: Normal heart sounds.  Pulmonary:     Effort: Pulmonary effort is normal. No respiratory distress.     Breath sounds: Normal  breath sounds.  Abdominal:     Palpations: Abdomen is soft.     Tenderness: There is abdominal tenderness in the right upper quadrant and epigastric area. There is no right CVA tenderness or left CVA tenderness.  Musculoskeletal:     Cervical back: Normal range of motion and neck supple.  Skin:    General: Skin is warm and dry.  Neurological:     Mental Status: He is alert.  Psychiatric:        Behavior: Behavior normal.     ED Results / Procedures / Treatments   Labs (all labs ordered are listed, but only abnormal results are displayed) Labs Reviewed  BASIC METABOLIC PANEL - Abnormal; Notable for the following components:      Result Value   Potassium 3.4 (*)    Glucose, Bld 142 (*)     Calcium 8.4 (*)    All other components within normal limits  CBC  LIPASE, BLOOD  HEPATIC FUNCTION PANEL  TROPONIN I (HIGH SENSITIVITY)    EKG EKG Interpretation  Date/Time:  Tuesday May 02 2023 17:58:56 EDT Ventricular Rate:  61 PR Interval:  157 QRS Duration: 100 QT Interval:  366 QTC Calculation: 369 R Axis:   70 Text Interpretation: Sinus rhythm Borderline repolarization abnormality Borderline ST elevation, lateral leads downsloping st segments in inferior and lateral leads seen on proir though not most recent Otherwise no significant change Confirmed by Melene Plan [id number]) on 05/02/2023 6:03:22 PM  Radiology US Abdomen Limited RUQ (LIVER/GB)  Result Date: 05/02/2023 CLINICAL DATA:  Right upper quadrant pain EXAM: ULTRASOUND ABDOMEN LIMITED RIGHT UPPER QUADRANT COMPARISON:  CT abdomen and pelvis 04/11/2023 and ultrasound 03/09/2023 FINDINGS: Gallbladder: No gallstones or wall thickening visualized. No sonographic Murphy sign noted by sonographer. Common bile duct: Diameter: 7 mm.  No intrahepatic biliary dilation. Liver: No focal lesion identified. Within normal limits in parenchymal echogenicity. Portal vein is patent on color Doppler imaging with normal direction of blood flow towards the liver. Other: None. IMPRESSION: No sonographic correlate for right upper quadrant pain. Electronically Signed   By: Minerva Fester M.D.   On: 05/02/2023 20:04   DG Chest 2 View  Result Date: 05/02/2023 CLINICAL DATA:  Right-sided lower chest pain. EXAM: CHEST - 2 VIEW COMPARISON:  February 12, 2023 FINDINGS: The heart size and mediastinal contours are within normal limits. Both lungs are clear. The visualized skeletal structures are unremarkable. IMPRESSION: No active cardiopulmonary disease. Electronically Signed   By: Aram Candela M.D.   On: 05/02/2023 18:33    Procedures Procedures  {Document cardiac monitor, telemetry assessment procedure when appropriate:1}  Medications Ordered in  ED Medications  sodium chloride 0.9 % bolus 1,000 mL (0 mLs Intravenous Stopped 05/02/23 2113)  ondansetron (ZOFRAN) injection 4 mg (4 mg Intravenous Given 05/02/23 1933)  fentaNYL (SUBLIMAZE) injection 50 mcg (50 mcg Intravenous Given 05/02/23 1933)    ED Course/ Medical Decision Making/ A&P Clinical Course as of 05/02/23 2119  Tue May 02, 2023  2025 Hepatic function panel [AH]    Clinical Course User Index [AH] Arthor Captain, PA-C   {   Click here for ABCD2, HEART and other calculatorsREFRESH Note before signing :1}                          Medical Decision Making Amount and/or Complexity of Data Reviewed Labs: ordered. Decision-making details documented in ED Course. Radiology: ordered.  Risk Prescription drug management.    {Document critical  care time when appropriate:1} {Document review of labs and clinical decision tools ie heart score, Chads2Vasc2 etc:1}  {Document your independent review of radiology images, and any outside records:1} {Document your discussion with family members, caretakers, and with consultants:1} {Document social determinants of health affecting pt's care:1} {Document your decision making why or why not admission, treatments were needed:1} Final Clinical Impression(s) / ED Diagnoses Final diagnoses:  Acute pancreatitis, unspecified complication status, unspecified pancreatitis type    Rx / DC Orders ED Discharge Orders     None

## 2023-05-02 NOTE — ED Triage Notes (Signed)
Pt reports sharp right chest pain below his right breast, onset last night and has worsened, associated with nausea and a little shortness of breath.

## 2023-05-02 NOTE — Discharge Instructions (Addendum)
Contact a health care provider if: You do not get better as fast as expected. Your symptoms get worse or you get new symptoms. You keep having pain, weakness, or nausea. You get better and then pain comes back. You have a fever. Get help right away if: You vomit every time you eat or drink. Your pain becomes severe. Your skin or the white parts of your eyes turn yellow (jaundice). You have sudden swelling in your abdomen. You feel dizzy or you faint. Your blood sugar is high (over 300 mg/dL). You vomit blood. These symptoms may be an emergency. Get help right away. Call 911. Do not wait to see if the symptoms will go away. Do not drive yourself to the hospital. 

## 2023-05-02 NOTE — ED Notes (Signed)
Pt A&OX4 ambulatory at d/c with independent steady gait. Pt verbalized understanding of d/c instructions, prescriptions and follow up care. 

## 2023-05-13 ENCOUNTER — Encounter (HOSPITAL_BASED_OUTPATIENT_CLINIC_OR_DEPARTMENT_OTHER): Payer: Self-pay

## 2023-05-13 ENCOUNTER — Other Ambulatory Visit: Payer: Self-pay

## 2023-05-13 ENCOUNTER — Emergency Department (HOSPITAL_BASED_OUTPATIENT_CLINIC_OR_DEPARTMENT_OTHER)
Admission: EM | Admit: 2023-05-13 | Discharge: 2023-05-13 | Disposition: A | Discharge status: Home / Self Care | Payer: Medicaid Other | Attending: Emergency Medicine | Admitting: Emergency Medicine

## 2023-05-13 DIAGNOSIS — K859 Acute pancreatitis without necrosis or infection, unspecified: Secondary | ICD-10-CM | POA: Insufficient documentation

## 2023-05-13 DIAGNOSIS — I10 Essential (primary) hypertension: Secondary | ICD-10-CM | POA: Diagnosis not present

## 2023-05-13 DIAGNOSIS — R1010 Upper abdominal pain, unspecified: Secondary | ICD-10-CM | POA: Diagnosis present

## 2023-05-13 LAB — COMPREHENSIVE METABOLIC PANEL
ALT: 15 U/L (ref 0–44)
AST: 23 U/L (ref 15–41)
Albumin: 4.1 g/dL (ref 3.5–5.0)
Alkaline Phosphatase: 68 U/L (ref 38–126)
Anion gap: 9 (ref 5–15)
BUN: 14 mg/dL (ref 6–20)
CO2: 22 mmol/L (ref 22–32)
Calcium: 9.1 mg/dL (ref 8.9–10.3)
Chloride: 107 mmol/L (ref 98–111)
Creatinine, Ser: 0.97 mg/dL (ref 0.61–1.24)
GFR, Estimated: >60 mL/min (ref >60)
Glucose, Bld: 120 mg/dL — ABNORMAL HIGH (ref 70–99)
Potassium: 4 mmol/L (ref 3.5–5.1)
Sodium: 138 mmol/L (ref 135–145)
Total Bilirubin: 0.6 mg/dL (ref 0.3–1.2)
Total Protein: 7.8 g/dL (ref 6.5–8.1)

## 2023-05-13 LAB — CBC
HCT: 40.6 % (ref 39.0–52.0)
Hemoglobin: 13.8 g/dL (ref 13.0–17.0)
MCH: 29 pg (ref 26.0–34.0)
MCHC: 34 g/dL (ref 30.0–36.0)
MCV: 85.3 fL (ref 80.0–100.0)
Platelets: 236 10*3/uL (ref 150–400)
RBC: 4.76 MIL/uL (ref 4.22–5.81)
RDW: 12.9 % (ref 11.5–15.5)
WBC: 4.5 10*3/uL (ref 4.0–10.5)
nRBC: 0 % (ref 0.0–0.2)

## 2023-05-13 LAB — LIPASE, BLOOD: Lipase: 382 U/L — ABNORMAL HIGH (ref 11–51)

## 2023-05-13 MED ORDER — SODIUM CHLORIDE 0.9 % IV BOLUS
1000.0000 mL | Freq: Once | INTRAVENOUS | Status: AC
  Administered 2023-05-13: 1000 mL via INTRAVENOUS

## 2023-05-13 MED ORDER — SODIUM CHLORIDE 0.9 % IV SOLN: INTRAVENOUS | Status: DC

## 2023-05-13 MED ORDER — OXYCODONE-ACETAMINOPHEN 5-325 MG PO TABS: 1.0000 | ORAL_TABLET | Freq: Four times a day (QID) | ORAL | 0 refills | Status: DC | PRN

## 2023-05-13 MED ORDER — ONDANSETRON HCL 4 MG/2ML IJ SOLN
4.0000 mg | Freq: Once | INTRAMUSCULAR | Status: AC
  Administered 2023-05-13: 4 mg via INTRAVENOUS
  Filled 2023-05-13: qty 2

## 2023-05-13 MED ORDER — HYDROMORPHONE HCL 1 MG/ML IJ SOLN
1.0000 mg | Freq: Once | INTRAMUSCULAR | Status: AC
  Administered 2023-05-13: 1 mg via INTRAVENOUS
  Filled 2023-05-13: qty 1

## 2023-05-13 MED ORDER — ONDANSETRON 8 MG PO TBDP: 8.0000 mg | ORAL_TABLET | Freq: Three times a day (TID) | ORAL | 0 refills | Status: DC | PRN

## 2023-05-13 MED ORDER — NAPROXEN 375 MG PO TABS: 375.0000 mg | ORAL_TABLET | Freq: Two times a day (BID) | ORAL | 0 refills | Status: DC

## 2023-05-13 NOTE — Discharge Instructions (Signed)
Take the medications as needed for nausea and pain.  Clear liquid diet for the next 24 hours.  You can slowly advance as tolerated.  Follow-up with a primary care doctor to be rechecked.  Return to the hospital as needed for worsening symptoms

## 2023-05-13 NOTE — ED Provider Notes (Signed)
[place] [facility] Provider Note   CSN: [id number] Arrival date & time: 05/13/23  0901     History  Chief Complaint  Patient presents with   Abdominal Pain    Max Ayers is a 52 y.o. male.   Abdominal Pain    Patient has a history of hypertension and pancreatitis who presents ED for upper abdominal pain.  Patient states his symptoms started this morning.  It is in his upper abdomen more towards the right.  Feels similar to his prior bouts of pancreatitis.  He has had nausea and vomiting.  He denies any alcohol use.  He denies any prior abdominal surgeries.  No chest pain.  No fevers.  No shortness of breath.  Home Medications Prior to Admission medications   Medication Sig Start Date End Date Taking? Authorizing Provider  naproxen (NAPROSYN) 375 MG tablet Take 1 tablet (375 mg total) by mouth 2 (two) times daily. 05/13/23  Yes Linwood Dibbles, MD  ondansetron (ZOFRAN-ODT) 8 MG disintegrating tablet Take 1 tablet (8 mg total) by mouth every 8 (eight) hours as needed for nausea or vomiting. 05/13/23  Yes Linwood Dibbles, MD  oxyCODONE-acetaminophen (PERCOCET/ROXICET) 5-325 MG tablet Take 1 tablet by mouth every 6 (six) hours as needed for severe pain. 05/13/23  Yes Linwood Dibbles, MD  amLODipine (NORVASC) 10 MG tablet Take 1 tablet (10 mg total) by mouth daily. 04/15/23   Azucena Fallen, MD  docusate sodium (COLACE) 100 MG capsule Take 1 capsule (100 mg total) by mouth 2 (two) times daily. 04/15/23   Azucena Fallen, MD  EPINEPHrine 0.3 mg/0.3 mL IJ SOAJ injection Inject 0.3 mg into the muscle as needed for anaphylaxis. 06/11/19   [provider]  hydrALAZINE (APRESOLINE) 100 MG tablet Take 1 tablet (100 mg total) by mouth 3 (three) times daily. 04/15/23   Azucena Fallen, MD  Hyprom-Naphaz-Polysorb-Zn Sulf (CLEAR EYES COMPLETE) SOLN Place 1 drop into both eyes as needed (for irritation).    [provider]  ondansetron (ZOFRAN) 4 MG  tablet Take 1 tablet (4 mg total) by mouth every 6 (six) hours as needed for nausea. 04/15/23   Azucena Fallen, MD  oxyCODONE (ROXICODONE) 5 MG immediate release tablet Take 1 tablet (5 mg total) by mouth every 4 (four) hours as needed for severe pain. 05/02/23   Harris, Cammy Copa, PA-C  pantoprazole (PROTONIX) 40 MG tablet Take 1 tablet (40 mg total) by mouth 2 (two) times daily as needed (heartburn). 04/15/23 07/14/23  Azucena Fallen, MD  polyethylene glycol (MIRALAX / GLYCOLAX) 17 g packet Take 17 g by mouth 2 (two) times daily. 04/15/23   Azucena Fallen, MD      Allergies    Yellow jacket venom [bee venom] and Gadolinium derivatives    Review of Systems   Review of Systems  Gastrointestinal:  Positive for abdominal pain.    Physical Exam Updated Vital Signs BP (!) 136/58   Pulse 62   Temp 98.9 F (37.2 C) (Oral)   Resp 17   Ht 1.803 m (5\' 11" )   Wt 93 kg   SpO2 99%   BMI 28.59 kg/m  Physical Exam Vitals and nursing note reviewed.  Constitutional:      Appearance: He is well-developed.     Comments: Appears to be in pain  HENT:     Head: Normocephalic and atraumatic.     Right Ear: External ear normal.     Left Ear: External  ear normal.  Eyes:     General: No scleral icterus.       Right eye: No discharge.        Left eye: No discharge.     Conjunctiva/sclera: Conjunctivae normal.  Neck:     Trachea: No tracheal deviation.  Cardiovascular:     Rate and Rhythm: Normal rate and regular rhythm.  Pulmonary:     Effort: Pulmonary effort is normal. No respiratory distress.     Breath sounds: Normal breath sounds. No stridor. No wheezing or rales.  Abdominal:     General: Bowel sounds are normal. There is no distension.     Palpations: Abdomen is soft.     Tenderness: There is abdominal tenderness in the epigastric area. There is no guarding or rebound.  Musculoskeletal:        General: No tenderness or deformity.     Cervical back: Neck supple.  Skin:     General: Skin is warm and dry.     Findings: No rash.  Neurological:     General: No focal deficit present.     Mental Status: He is alert.     Cranial Nerves: No cranial nerve deficit, dysarthria or facial asymmetry.     Sensory: No sensory deficit.     Motor: No abnormal muscle tone or seizure activity.     Coordination: Coordination normal.  Psychiatric:        Mood and Affect: Mood normal.     ED Results / Procedures / Treatments   Labs (all labs ordered are listed, but only abnormal results are displayed) Labs Reviewed  LIPASE, BLOOD - Abnormal; Notable for the following components:      Result Value   Lipase 382 (*)    All other components within normal limits  COMPREHENSIVE METABOLIC PANEL - Abnormal; Notable for the following components:   Glucose, Bld 120 (*)    All other components within normal limits  CBC  URINALYSIS, ROUTINE W REFLEX MICROSCOPIC    EKG EKG Interpretation  Date/Time:  Saturday May 13 2023 09:10:04 EDT Ventricular Rate:  60 PR Interval:  151 QRS Duration: 94 QT Interval:  395 QTC Calculation: 395 R Axis:   70 Text Interpretation: Sinus arrhythmia Probable left ventricular hypertrophy No significant change since last tracing Confirmed by Linwood Dibbles [phone]) on 05/13/2023 9:20:42 AM  Radiology No results found.  Procedures Procedures    Medications Ordered in ED Medications  sodium chloride 0.9 % bolus 1,000 mL (0 mLs Intravenous Stopped 05/13/23 1107)    And  0.9 %  sodium chloride infusion ( Intravenous New Bag/Given 05/13/23 0920)  HYDROmorphone (DILAUDID) injection 1 mg (1 mg Intravenous Given 05/13/23 0917)  ondansetron (ZOFRAN) injection 4 mg (4 mg Intravenous Given 05/13/23 0917)  HYDROmorphone (DILAUDID) injection 1 mg (1 mg Intravenous Given 05/13/23 1020)    ED Course/ Medical Decision Making/ A&P Clinical Course as of 05/13/23 1128  Sat May 13, 2023  0955 CBC CBC normal.  Metabolic panel normal [JK]  0955 Lipase, blood(!) Lipase  elevated at 382 [JK]  0956 CT scan on May 21 showed findings consistent with pancreatitis.  No evidence of gallstones  [JK]  1125 Patient states he is feeling better at this time.  Discussed hospitalization versus outpatient management.  Patient feels like he can manage at home [JK]    Clinical Course User Index [JK] Linwood Dibbles, MD  Medical Decision Making Differential diagnosis includes but not limited to pancreatitis cholecystitis gastritis  Problems Addressed: Acute pancreatitis, unspecified complication status, unspecified pancreatitis type: acute illness or injury that poses a threat to life or bodily functions  Amount and/or Complexity of Data Reviewed Labs: ordered. Decision-making details documented in ED Course.  Risk Prescription drug management. Parenteral controlled substances. Decision regarding hospitalization.   Patient presented to ED for evaluation of recurrent abdominal pain.  Patient has known history of pancreatitis.  Patient has had similar bouts in the past.  Denies any recent alcohol use.  Patient's laboratory test are consistent with a recurrent bout of pancreatitis.  His lipase is elevated.  I did review recent visits and he had a CT scan of his abdomen pelvis last month that did not show any signs of gallstones or choledocholithiasis.  Patient was treated with IV fluids, pain medications and antiemetics.  He is improved.  He has not had any vomiting in the ED.  He is comfortable.  Discussed inpatient versus outpatient management.  Patient states he feels like he can manage at home and would like to try that instead of being admitted to the hospital.  Will discharge home with medications for pain and nausea.  Discussed warning signs and precautions.          Final Clinical Impression(s) / ED Diagnoses Final diagnoses:  Acute pancreatitis, unspecified complication status, unspecified pancreatitis type    Rx / DC Orders ED  Discharge Orders          Ordered    oxyCODONE-acetaminophen (PERCOCET/ROXICET) 5-325 MG tablet  Every 6 hours PRN        05/13/23 1127    naproxen (NAPROSYN) 375 MG tablet  2 times daily        05/13/23 1127    ondansetron (ZOFRAN-ODT) 8 MG disintegrating tablet  Every 8 hours PRN        05/13/23 1127              Linwood Dibbles, MD 05/13/23 1129

## 2023-05-13 NOTE — ED Triage Notes (Signed)
Patient having ABD pain since today. She nausea with one episode of vomiting. He stated it feels like his pancreatitis.

## 2023-05-14 ENCOUNTER — Other Ambulatory Visit: Payer: Self-pay

## 2023-05-14 ENCOUNTER — Encounter (HOSPITAL_BASED_OUTPATIENT_CLINIC_OR_DEPARTMENT_OTHER): Payer: Self-pay | Admitting: Emergency Medicine

## 2023-05-14 ENCOUNTER — Emergency Department (HOSPITAL_BASED_OUTPATIENT_CLINIC_OR_DEPARTMENT_OTHER)
Admission: EM | Admit: 2023-05-14 | Discharge: 2023-05-14 | Disposition: A | Discharge status: Home / Self Care | Payer: Medicaid Other | Attending: Emergency Medicine | Admitting: Emergency Medicine

## 2023-05-14 DIAGNOSIS — R1013 Epigastric pain: Secondary | ICD-10-CM | POA: Diagnosis present

## 2023-05-14 DIAGNOSIS — K859 Acute pancreatitis without necrosis or infection, unspecified: Secondary | ICD-10-CM

## 2023-05-14 LAB — CBC WITH DIFFERENTIAL/PLATELET
Abs Immature Granulocytes: 0.01 10*3/uL (ref 0.00–0.07)
Basophils Absolute: 0 10*3/uL (ref 0.0–0.1)
Basophils Relative: 0 %
Eosinophils Absolute: 0.1 10*3/uL (ref 0.0–0.5)
Eosinophils Relative: 1 %
HCT: 41 % (ref 39.0–52.0)
Hemoglobin: 13.8 g/dL (ref 13.0–17.0)
Immature Granulocytes: 0 %
Lymphocytes Relative: 29 %
Lymphs Abs: 1.9 10*3/uL (ref 0.7–4.0)
MCH: 29 pg (ref 26.0–34.0)
MCHC: 33.7 g/dL (ref 30.0–36.0)
MCV: 86.1 fL (ref 80.0–100.0)
Monocytes Absolute: 0.7 10*3/uL (ref 0.1–1.0)
Monocytes Relative: 10 %
Neutro Abs: 3.8 10*3/uL (ref 1.7–7.7)
Neutrophils Relative %: 60 %
Platelets: 237 10*3/uL (ref 150–400)
RBC: 4.76 MIL/uL (ref 4.22–5.81)
RDW: 12.9 % (ref 11.5–15.5)
WBC: 6.5 10*3/uL (ref 4.0–10.5)
nRBC: 0 % (ref 0.0–0.2)

## 2023-05-14 LAB — COMPREHENSIVE METABOLIC PANEL
ALT: 14 U/L (ref 0–44)
AST: 18 U/L (ref 15–41)
Albumin: 4 g/dL (ref 3.5–5.0)
Alkaline Phosphatase: 67 U/L (ref 38–126)
Anion gap: 10 (ref 5–15)
BUN: 10 mg/dL (ref 6–20)
CO2: 22 mmol/L (ref 22–32)
Calcium: 8.8 mg/dL — ABNORMAL LOW (ref 8.9–10.3)
Chloride: 105 mmol/L (ref 98–111)
Creatinine, Ser: 0.83 mg/dL (ref 0.61–1.24)
GFR, Estimated: >60 mL/min (ref >60)
Glucose, Bld: 108 mg/dL — ABNORMAL HIGH (ref 70–99)
Potassium: 3.5 mmol/L (ref 3.5–5.1)
Sodium: 137 mmol/L (ref 135–145)
Total Bilirubin: 0.7 mg/dL (ref 0.3–1.2)
Total Protein: 7.6 g/dL (ref 6.5–8.1)

## 2023-05-14 LAB — LIPASE, BLOOD: Lipase: 378 U/L — ABNORMAL HIGH (ref 11–51)

## 2023-05-14 MED ORDER — ONDANSETRON HCL 4 MG/2ML IJ SOLN
4.0000 mg | Freq: Once | INTRAMUSCULAR | Status: AC
  Administered 2023-05-14: 4 mg via INTRAVENOUS
  Filled 2023-05-14: qty 2

## 2023-05-14 MED ORDER — HYDROMORPHONE HCL 1 MG/ML IJ SOLN
1.0000 mg | Freq: Once | INTRAMUSCULAR | Status: AC
  Administered 2023-05-14: 1 mg via INTRAVENOUS
  Filled 2023-05-14: qty 1

## 2023-05-14 MED ORDER — SODIUM CHLORIDE 0.9 % IV BOLUS
1000.0000 mL | Freq: Once | INTRAVENOUS | Status: AC
  Administered 2023-05-14: 1000 mL via INTRAVENOUS

## 2023-05-14 NOTE — Discharge Instructions (Signed)
Continue medications as previously prescribed.  Clear liquid diet for the next 2 days, then slowly advance to normal as tolerated.  Follow-up with your gastroenterologist in the next week.

## 2023-05-14 NOTE — ED Triage Notes (Signed)
Pt POV c/o abd pain starting this AM. Seen today for same. Hx of pancreatitis.

## 2023-05-14 NOTE — ED Provider Notes (Signed)
[place] [facility] Provider Note   CSN: [id number] Arrival date & time: 05/14/23  0019     History  Chief Complaint  Patient presents with   Abdominal Pain    Max Ayers is a 52 y.o. male.  Patient is a 52 year old male with past medical history of recurrent pancreatitis.  Patient presenting today with complaints of epigastric pain.  He was seen earlier today and treated for the same.  He seemed to be feeling better and was discharged to home.  Since he has been home his pain is returned and is worse.  He describes some nausea and vomiting, but no diarrhea.  No fevers or chills.  Pain is worse with movement and eating.  No alleviating factors.  The history is provided by the patient.       Home Medications Prior to Admission medications   Medication Sig Start Date End Date Taking? Authorizing Provider  amLODipine (NORVASC) 10 MG tablet Take 1 tablet (10 mg total) by mouth daily. 04/15/23   Azucena Fallen, MD  docusate sodium (COLACE) 100 MG capsule Take 1 capsule (100 mg total) by mouth 2 (two) times daily. 04/15/23   Azucena Fallen, MD  EPINEPHrine 0.3 mg/0.3 mL IJ SOAJ injection Inject 0.3 mg into the muscle as needed for anaphylaxis. 06/11/19   [provider]  hydrALAZINE (APRESOLINE) 100 MG tablet Take 1 tablet (100 mg total) by mouth 3 (three) times daily. 04/15/23   Azucena Fallen, MD  Hyprom-Naphaz-Polysorb-Zn Sulf (CLEAR EYES COMPLETE) SOLN Place 1 drop into both eyes as needed (for irritation).    [provider]  naproxen (NAPROSYN) 375 MG tablet Take 1 tablet (375 mg total) by mouth 2 (two) times daily. 05/13/23   Linwood Dibbles, MD  ondansetron (ZOFRAN) 4 MG tablet Take 1 tablet (4 mg total) by mouth every 6 (six) hours as needed for nausea. 04/15/23   Azucena Fallen, MD  ondansetron (ZOFRAN-ODT) 8 MG disintegrating tablet Take 1 tablet (8 mg total) by mouth every 8 (eight) hours as needed for nausea or  vomiting. 05/13/23   Linwood Dibbles, MD  oxyCODONE (ROXICODONE) 5 MG immediate release tablet Take 1 tablet (5 mg total) by mouth every 4 (four) hours as needed for severe pain. 05/02/23   Arthor Captain, PA-C  oxyCODONE-acetaminophen (PERCOCET/ROXICET) 5-325 MG tablet Take 1 tablet by mouth every 6 (six) hours as needed for severe pain. 05/13/23   Linwood Dibbles, MD  pantoprazole (PROTONIX) 40 MG tablet Take 1 tablet (40 mg total) by mouth 2 (two) times daily as needed (heartburn). 04/15/23 07/14/23  Azucena Fallen, MD  polyethylene glycol (MIRALAX / GLYCOLAX) 17 g packet Take 17 g by mouth 2 (two) times daily. 04/15/23   Azucena Fallen, MD      Allergies    Yellow jacket venom [bee venom] and Gadolinium derivatives    Review of Systems   Review of Systems  All other systems reviewed and are negative.   Physical Exam Updated Vital Signs BP (!) 178/100   Pulse 93   Temp 98.9 F (37.2 C) (Oral)   Resp (!) 24   Ht 5\' 11"  (1.803 m)   Wt 90.7 kg   SpO2 95%   BMI 27.89 kg/m  Physical Exam Vitals and nursing note reviewed.  Constitutional:      General: He is not in acute distress.    Appearance: He is well-developed. He is not diaphoretic.  HENT:  Head: Normocephalic and atraumatic.  Cardiovascular:     Rate and Rhythm: Normal rate and regular rhythm.     Heart sounds: No murmur heard.    No friction rub.  Pulmonary:     Effort: Pulmonary effort is normal. No respiratory distress.     Breath sounds: Normal breath sounds. No wheezing or rales.  Abdominal:     General: Bowel sounds are normal. There is no distension.     Palpations: Abdomen is soft.     Tenderness: There is abdominal tenderness in the epigastric area. There is no right CVA tenderness, left CVA tenderness, guarding or rebound.  Musculoskeletal:        General: Normal range of motion.     Cervical back: Normal range of motion and neck supple.  Skin:    General: Skin is warm and dry.  Neurological:     Mental  Status: He is alert and oriented to person, place, and time.     Coordination: Coordination normal.     ED Results / Procedures / Treatments   Labs (all labs ordered are listed, but only abnormal results are displayed) Labs Reviewed - No data to display  EKG None  Radiology No results found.  Procedures Procedures    Medications Ordered in ED Medications  sodium chloride 0.9 % bolus 1,000 mL (has no administration in time range)  ondansetron (ZOFRAN) injection 4 mg (has no administration in time range)  HYDROmorphone (DILAUDID) injection 1 mg (has no administration in time range)    ED Course/ Medical Decision Making/ A&P  Patient with history of chronic pancreatitis presenting with epigastric pain.  He was here earlier this morning with similar complaints.  His pain was controlled and was discharged.  He returns with worsening pain.  He arrives here with stable vital signs and is afebrile.  There is tenderness in the epigastric region, but exam otherwise unremarkable.  Laboratory studies obtained including CBC, comprehensive metabolic panel, and lipase.  The lipase is elevated at 378, but there is no leukocytosis, elevation of LFTs, or electrolyte derangement.  Patient hydrated with normal saline and given Dilaudid x 2 for pain and Zofran for nausea.  He is now feeling significantly improved.  Disposition discussed with the patient and he would like to avoid admission.  He is requesting to go home and will return as needed if he experiences additional problems.  Final Clinical Impression(s) / ED Diagnoses Final diagnoses:  None    Rx / DC Orders ED Discharge Orders     None         Geoffery Lyons, MD 05/14/23 0221

## 2023-05-23 ENCOUNTER — Encounter (HOSPITAL_COMMUNITY): Payer: Self-pay | Admitting: Gastroenterology

## 2023-05-25 ENCOUNTER — Other Ambulatory Visit: Payer: Self-pay

## 2023-05-25 ENCOUNTER — Emergency Department (HOSPITAL_BASED_OUTPATIENT_CLINIC_OR_DEPARTMENT_OTHER): Payer: Medicaid Other

## 2023-05-25 ENCOUNTER — Inpatient Hospital Stay (HOSPITAL_BASED_OUTPATIENT_CLINIC_OR_DEPARTMENT_OTHER)
Admission: EM | Admit: 2023-05-25 | Discharge: 2023-05-28 | DRG: 439 | Disposition: A | Discharge status: Home / Self Care | Payer: Medicaid Other | Attending: Internal Medicine | Admitting: Internal Medicine

## 2023-05-25 ENCOUNTER — Encounter (HOSPITAL_BASED_OUTPATIENT_CLINIC_OR_DEPARTMENT_OTHER): Payer: Self-pay

## 2023-05-25 DIAGNOSIS — Z1152 Encounter for screening for COVID-19: Secondary | ICD-10-CM

## 2023-05-25 DIAGNOSIS — K863 Pseudocyst of pancreas: Secondary | ICD-10-CM | POA: Diagnosis present

## 2023-05-25 DIAGNOSIS — Z91041 Radiographic dye allergy status: Secondary | ICD-10-CM

## 2023-05-25 DIAGNOSIS — Z87891 Personal history of nicotine dependence: Secondary | ICD-10-CM

## 2023-05-25 DIAGNOSIS — Z8249 Family history of ischemic heart disease and other diseases of the circulatory system: Secondary | ICD-10-CM

## 2023-05-25 DIAGNOSIS — I10 Essential (primary) hypertension: Secondary | ICD-10-CM | POA: Diagnosis present

## 2023-05-25 DIAGNOSIS — K859 Acute pancreatitis without necrosis or infection, unspecified: Principal | ICD-10-CM | POA: Diagnosis present

## 2023-05-25 DIAGNOSIS — E876 Hypokalemia: Secondary | ICD-10-CM | POA: Diagnosis present

## 2023-05-25 DIAGNOSIS — Z888 Allergy status to other drugs, medicaments and biological substances status: Secondary | ICD-10-CM

## 2023-05-25 DIAGNOSIS — D649 Anemia, unspecified: Secondary | ICD-10-CM | POA: Diagnosis present

## 2023-05-25 DIAGNOSIS — Z91030 Bee allergy status: Secondary | ICD-10-CM

## 2023-05-25 DIAGNOSIS — Z79899 Other long term (current) drug therapy: Secondary | ICD-10-CM

## 2023-05-25 DIAGNOSIS — F1011 Alcohol abuse, in remission: Secondary | ICD-10-CM | POA: Diagnosis present

## 2023-05-25 DIAGNOSIS — K8681 Exocrine pancreatic insufficiency: Secondary | ICD-10-CM | POA: Diagnosis present

## 2023-05-25 DIAGNOSIS — K861 Other chronic pancreatitis: Secondary | ICD-10-CM | POA: Diagnosis present

## 2023-05-25 LAB — CBC
HCT: 40.6 % (ref 39.0–52.0)
Hemoglobin: 13.8 g/dL (ref 13.0–17.0)
MCH: 29.1 pg (ref 26.0–34.0)
MCHC: 34 g/dL (ref 30.0–36.0)
MCV: 85.7 fL (ref 80.0–100.0)
Platelets: 281 10*3/uL (ref 150–400)
RBC: 4.74 MIL/uL (ref 4.22–5.81)
RDW: 12.7 % (ref 11.5–15.5)
WBC: 4.7 10*3/uL (ref 4.0–10.5)
nRBC: 0 % (ref 0.0–0.2)

## 2023-05-25 LAB — COMPREHENSIVE METABOLIC PANEL
ALT: 15 U/L (ref 0–44)
AST: 19 U/L (ref 15–41)
Albumin: 4 g/dL (ref 3.5–5.0)
Alkaline Phosphatase: 64 U/L (ref 38–126)
Anion gap: 6 (ref 5–15)
BUN: 13 mg/dL (ref 6–20)
CO2: 25 mmol/L (ref 22–32)
Calcium: 8.9 mg/dL (ref 8.9–10.3)
Chloride: 105 mmol/L (ref 98–111)
Creatinine, Ser: 1 mg/dL (ref 0.61–1.24)
GFR, Estimated: >60 mL/min (ref >60)
Glucose, Bld: 121 mg/dL — ABNORMAL HIGH (ref 70–99)
Potassium: 3 mmol/L — ABNORMAL LOW (ref 3.5–5.1)
Sodium: 136 mmol/L (ref 135–145)
Total Bilirubin: 0.6 mg/dL (ref 0.3–1.2)
Total Protein: 7.6 g/dL (ref 6.5–8.1)

## 2023-05-25 LAB — URINALYSIS, ROUTINE W REFLEX MICROSCOPIC
Glucose, UA: NEGATIVE mg/dL
Hgb urine dipstick: NEGATIVE
Ketones, ur: NEGATIVE mg/dL
Leukocytes,Ua: NEGATIVE
Nitrite: NEGATIVE
Protein, ur: 30 mg/dL — AB
Specific Gravity, Urine: >=1.03 (ref 1.005–1.030)
pH: 6 (ref 5.0–8.0)

## 2023-05-25 LAB — URINALYSIS, MICROSCOPIC (REFLEX)

## 2023-05-25 NOTE — ED Triage Notes (Signed)
For the past few days pt has had RUQ pain after eating but today after eating it became severe.  Pt also c/o N/V.   Pt has hx of pancreatitis but still has Gallbladder.

## 2023-05-26 ENCOUNTER — Other Ambulatory Visit: Payer: Self-pay

## 2023-05-26 ENCOUNTER — Encounter (HOSPITAL_COMMUNITY): Payer: Self-pay | Admitting: Family Medicine

## 2023-05-26 DIAGNOSIS — Z8249 Family history of ischemic heart disease and other diseases of the circulatory system: Secondary | ICD-10-CM | POA: Diagnosis not present

## 2023-05-26 DIAGNOSIS — Z888 Allergy status to other drugs, medicaments and biological substances status: Secondary | ICD-10-CM | POA: Diagnosis not present

## 2023-05-26 DIAGNOSIS — K863 Pseudocyst of pancreas: Secondary | ICD-10-CM | POA: Diagnosis present

## 2023-05-26 DIAGNOSIS — Z1152 Encounter for screening for COVID-19: Secondary | ICD-10-CM | POA: Diagnosis not present

## 2023-05-26 DIAGNOSIS — K859 Acute pancreatitis without necrosis or infection, unspecified: Secondary | ICD-10-CM | POA: Diagnosis present

## 2023-05-26 DIAGNOSIS — Z79899 Other long term (current) drug therapy: Secondary | ICD-10-CM | POA: Diagnosis not present

## 2023-05-26 DIAGNOSIS — K861 Other chronic pancreatitis: Secondary | ICD-10-CM | POA: Diagnosis present

## 2023-05-26 DIAGNOSIS — F1011 Alcohol abuse, in remission: Secondary | ICD-10-CM | POA: Diagnosis present

## 2023-05-26 DIAGNOSIS — Z91041 Radiographic dye allergy status: Secondary | ICD-10-CM | POA: Diagnosis not present

## 2023-05-26 DIAGNOSIS — I10 Essential (primary) hypertension: Secondary | ICD-10-CM

## 2023-05-26 DIAGNOSIS — Z91030 Bee allergy status: Secondary | ICD-10-CM | POA: Diagnosis not present

## 2023-05-26 DIAGNOSIS — Z87891 Personal history of nicotine dependence: Secondary | ICD-10-CM | POA: Diagnosis not present

## 2023-05-26 DIAGNOSIS — K8681 Exocrine pancreatic insufficiency: Secondary | ICD-10-CM | POA: Diagnosis present

## 2023-05-26 DIAGNOSIS — E876 Hypokalemia: Secondary | ICD-10-CM

## 2023-05-26 DIAGNOSIS — D649 Anemia, unspecified: Secondary | ICD-10-CM | POA: Diagnosis present

## 2023-05-26 LAB — CBC
HCT: 38.1 % — ABNORMAL LOW (ref 39.0–52.0)
Hemoglobin: 12.5 g/dL — ABNORMAL LOW (ref 13.0–17.0)
MCH: 29.1 pg (ref 26.0–34.0)
MCHC: 32.8 g/dL (ref 30.0–36.0)
MCV: 88.8 fL (ref 80.0–100.0)
Platelets: 235 10*3/uL (ref 150–400)
RBC: 4.29 MIL/uL (ref 4.22–5.81)
RDW: 13.1 % (ref 11.5–15.5)
WBC: 4.3 10*3/uL (ref 4.0–10.5)
nRBC: 0 % (ref 0.0–0.2)

## 2023-05-26 LAB — COMPREHENSIVE METABOLIC PANEL
ALT: 14 U/L (ref 0–44)
AST: 15 U/L (ref 15–41)
Albumin: 3.4 g/dL — ABNORMAL LOW (ref 3.5–5.0)
Alkaline Phosphatase: 55 U/L (ref 38–126)
Anion gap: 6 (ref 5–15)
BUN: 14 mg/dL (ref 6–20)
CO2: 26 mmol/L (ref 22–32)
Calcium: 8.4 mg/dL — ABNORMAL LOW (ref 8.9–10.3)
Chloride: 107 mmol/L (ref 98–111)
Creatinine, Ser: 0.94 mg/dL (ref 0.61–1.24)
GFR, Estimated: >60 mL/min (ref >60)
Glucose, Bld: 96 mg/dL (ref 70–99)
Potassium: 4 mmol/L (ref 3.5–5.1)
Sodium: 139 mmol/L (ref 135–145)
Total Bilirubin: 0.5 mg/dL (ref 0.3–1.2)
Total Protein: 6.3 g/dL — ABNORMAL LOW (ref 6.5–8.1)

## 2023-05-26 LAB — LIPASE, BLOOD: Lipase: 1314 U/L — ABNORMAL HIGH (ref 11–51)

## 2023-05-26 LAB — GAMMA GT: GGT: 26 U/L (ref 7–50)

## 2023-05-26 LAB — MAGNESIUM: Magnesium: 2.2 mg/dL (ref 1.7–2.4)

## 2023-05-26 LAB — ETHANOL: Alcohol, Ethyl (B): <10 mg/dL (ref <10)

## 2023-05-26 LAB — TRIGLYCERIDES: Triglycerides: 98 mg/dL (ref <150)

## 2023-05-26 MED ORDER — ACETAMINOPHEN 650 MG RE SUPP: 650.0000 mg | Freq: Four times a day (QID) | RECTAL | Status: DC | PRN

## 2023-05-26 MED ORDER — POTASSIUM CHLORIDE CRYS ER 20 MEQ PO TBCR
20.0000 meq | EXTENDED_RELEASE_TABLET | Freq: Once | ORAL | Status: AC
  Administered 2023-05-26: 20 meq via ORAL
  Filled 2023-05-26: qty 1

## 2023-05-26 MED ORDER — SODIUM CHLORIDE 0.9 % IV SOLN: INTRAVENOUS | Status: DC | PRN

## 2023-05-26 MED ORDER — ACETAMINOPHEN 325 MG PO TABS
650.0000 mg | ORAL_TABLET | Freq: Four times a day (QID) | ORAL | Status: DC | PRN
  Administered 2023-05-26: 650 mg via ORAL
  Filled 2023-05-26: qty 2

## 2023-05-26 MED ORDER — AMLODIPINE BESYLATE 10 MG PO TABS
10.0000 mg | ORAL_TABLET | Freq: Every day | ORAL | Status: DC
  Administered 2023-05-26 – 2023-05-28 (×3): 10 mg via ORAL
  Filled 2023-05-26 (×3): qty 1

## 2023-05-26 MED ORDER — PANTOPRAZOLE SODIUM 40 MG PO TBEC
40.0000 mg | DELAYED_RELEASE_TABLET | Freq: Every day | ORAL | Status: DC
  Administered 2023-05-26 – 2023-05-28 (×3): 40 mg via ORAL
  Filled 2023-05-26 (×3): qty 1

## 2023-05-26 MED ORDER — ONDANSETRON HCL 4 MG PO TABS: 4.0000 mg | ORAL_TABLET | Freq: Four times a day (QID) | ORAL | Status: DC | PRN

## 2023-05-26 MED ORDER — POTASSIUM CHLORIDE 10 MEQ/100ML IV SOLN
10.0000 meq | INTRAVENOUS | Status: AC
  Administered 2023-05-26 (×4): 10 meq via INTRAVENOUS
  Filled 2023-05-26 (×4): qty 100

## 2023-05-26 MED ORDER — DROPERIDOL 2.5 MG/ML IJ SOLN
1.2500 mg | Freq: Once | INTRAMUSCULAR | Status: AC
  Administered 2023-05-26: 1.25 mg via INTRAVENOUS
  Filled 2023-05-26: qty 2

## 2023-05-26 MED ORDER — ENOXAPARIN SODIUM 40 MG/0.4ML IJ SOSY
40.0000 mg | PREFILLED_SYRINGE | INTRAMUSCULAR | Status: DC
  Administered 2023-05-26 – 2023-05-28 (×3): 40 mg via SUBCUTANEOUS
  Filled 2023-05-26 (×3): qty 0.4

## 2023-05-26 MED ORDER — ONDANSETRON HCL 4 MG/2ML IJ SOLN
4.0000 mg | Freq: Four times a day (QID) | INTRAMUSCULAR | Status: DC | PRN
  Administered 2023-05-27: 4 mg via INTRAVENOUS
  Filled 2023-05-26: qty 2

## 2023-05-26 MED ORDER — FENTANYL CITRATE PF 50 MCG/ML IJ SOSY
50.0000 ug | PREFILLED_SYRINGE | Freq: Once | INTRAMUSCULAR | Status: AC
  Administered 2023-05-26: 50 ug via INTRAVENOUS
  Filled 2023-05-26: qty 1

## 2023-05-26 MED ORDER — SODIUM CHLORIDE 0.9 % IV SOLN: INTRAVENOUS | Status: AC

## 2023-05-26 MED ORDER — HYDROMORPHONE HCL 1 MG/ML IJ SOLN
0.5000 mg | INTRAMUSCULAR | Status: DC | PRN
  Filled 2023-05-26: qty 1

## 2023-05-26 MED ORDER — HYDROMORPHONE HCL 1 MG/ML IJ SOLN
0.5000 mg | INTRAMUSCULAR | Status: AC | PRN
  Administered 2023-05-26 – 2023-05-27 (×2): 0.5 mg via INTRAVENOUS
  Filled 2023-05-26 (×2): qty 0.5

## 2023-05-26 MED ORDER — HYDRALAZINE HCL 50 MG PO TABS
100.0000 mg | ORAL_TABLET | Freq: Three times a day (TID) | ORAL | Status: DC
  Administered 2023-05-26 – 2023-05-28 (×7): 100 mg via ORAL
  Filled 2023-05-26 (×7): qty 2

## 2023-05-26 NOTE — TOC CM/SW Note (Signed)
Transition of Care [facility]) - Inpatient Brief Assessment   Patient Details  Name: Max Ayers MRN: [id number] Date of Birth: 03/04/1971  Transition of Care [facility]) CM/SW Contact:    Otelia Santee, LCSW Phone Number: 05/26/2023, 12:38 PM   Clinical Narrative: Chart reviewed. No TOC needs identified.    Transition of Care Asessment: Insurance and Status: Insurance coverage has been reviewed Patient has primary care physician: Yes Home environment has been reviewed: Home with spouse Prior level of function:: Independent Prior/Current Home Services: No current home services Social Determinants of Health Reivew: SDOH reviewed no interventions necessary Readmission risk has been reviewed: Yes Transition of care needs: no transition of care needs at this time

## 2023-05-26 NOTE — ED Notes (Signed)
[facility] called for transport @01 :05 am

## 2023-05-26 NOTE — Progress Notes (Signed)
[facility] PROGRESS NOTE    Progress Note  Idris Rainone  MVH:[id number] DOB: [birth year] DOA: 05/25/2023 PCP: Patient, No Pcp Per     Brief Narrative:   Sailor Mcconaughy is an 52 y.o. male past medical history significant for alcohol abuse, recurrent pancreatitis and pseudocyst comes into the emergency room for abdominal pain nausea and vomiting, he relates his last alcoholic beverage was more than a year ago lipase of 1300 normal LFTs ultrasound redemonstrate known pancreatic pseudocyst   Assessment/Plan:   Acute pancreatitis/in the setting of pancreatic pseudocyst Agree with IV fluids narcotics antiemetics . Continue aggressive IV fluid resuscitation. Triglycerides are pending, LFTs are unremarkable including alkaline phosphatase. He relates his nausea and abdominal pain is improved discontinue narcotics will start him on a clear liquid diet advance as tolerated.  Hypokalemia: Repleted now resolved.  Benign essential HTN Blood pressure is controlled, resume home regimen.  Normocytic anemia: He relates no bloody stools, no signs of overt bleeding follow-up with PCP as an outpatient.  DVT prophylaxis: lovenox Family Communication:None Status is: Inpatient Remains inpatient appropriate because: Acute on chronic pancreatitis.    Code Status:     Code Status Orders  (From admission, onward)           Start     Ordered   05/26/23 0414  Full code  Continuous       Question:  By:  Answer:  Consent: discussion documented in EHR   05/26/23 0414           Code Status History     Date Active Date Inactive Code Status Order ID Comments User Context   04/11/2023 0914 04/15/2023 1959 Full Code [id number]  Maryln Gottron, MD Inpatient   03/09/2023 0036 03/12/2023 1604 Full Code [id number]  Charlsie Quest, MD ED   02/12/2023 1856 02/16/2023 1703 Full Code [id number]  Charlsie Quest, MD Inpatient   12/31/2022 0148 01/04/2023 1942 Full Code [id number]  Carollee Herter, DO Inpatient    12/31/2022 0119 12/31/2022 0148 Full Code [id number]  Carollee Herter, DO Inpatient   11/15/2022 1713 11/22/2022 1835 Full Code [id number]  Noralee Stain, DO Inpatient   09/06/2022 1808 09/11/2022 1833 Full Code [id number]  Kathlen Mody, MD Inpatient   08/05/2022 1526 08/07/2022 1809 Full Code [id number]  Bobette Mo, MD Inpatient   07/03/2022 2210 07/08/2022 1737 Full Code [id number]  Hillary Bow, DO Inpatient   06/02/2022 1531 06/04/2022 1824 Full Code [id number]  Teddy Spike, DO Inpatient   12/14/2021 1846 12/17/2021 2250 Full Code [id number]  Anselm Jungling, DO Inpatient   10/08/2021 1704 10/11/2021 1840 Full Code [id number]  Teddy Spike, DO Inpatient   09/17/2021 0849 09/20/2021 2036 Full Code [id number]  Bobette Mo, MD ED   12/12/2020 1628 12/15/2020 1838 Full Code [id number]  Clydie Braun, MD ED         IV Access:   Peripheral IV   Procedures and diagnostic studies:   US Abdomen Limited  Result Date: 05/25/2023 CLINICAL DATA:  Epigastric/right upper quadrant pain. EXAM: ULTRASOUND ABDOMEN LIMITED RIGHT UPPER QUADRANT COMPARISON:  Multiple priors, most recent CT and ultrasound 05/02/2023. MRCP 01/03/2023 FINDINGS: Gallbladder: Physiologically distended. No gallstones or wall thickening visualized. No sonographic Murphy sign noted by sonographer. Common bile duct: Diameter: 7 mm in the midportion, flaring distally at 10 mm. Liver: No intrahepatic biliary ductal dilatation. No focal lesion identified. Within normal limits in parenchymal echogenicity. Portal vein is patent on color Doppler imaging with normal direction of  blood flow towards the liver. Other: Known cystic lesion in the pancreatic head, previously characterized as pseudocyst. No right upper quadrant ascites. IMPRESSION: 1. Normal sonographic appearance of the gallbladder. 2. Mild flaring of the distal common bile duct. 3. Known pancreatic pseudocyst. Electronically Signed   By: Narda Rutherford M.D.   On: 05/25/2023 23:50      Medical Consultants:   None.   Subjective:    Anshuman Alamillo relates his pain and nausea are improved he would like to try diet.  Objective:    Vitals:   05/26/23 0245 05/26/23 0336 05/26/23 0357 05/26/23 0446  BP: (!) 100/54 (!) 155/82 128/78   Pulse: 63 (!) 47 (!) 46   Resp: 12 18    Temp:  97.6 F (36.4 C)    TempSrc:  Oral    SpO2: 99% 98% 97%   Weight:    86.6 kg  Height:    5\' 11"  (1.803 m)   SpO2: 97 %   Intake/Output Summary (Last 24 hours) at 05/26/2023 0726 Last data filed at 05/26/2023 0559 Gross per 24 hour  Intake 1124.58 ml  Output --  Net 1124.58 ml   Filed Weights   05/25/23 2255 05/26/23 0446  Weight: 90.7 kg 86.6 kg    Exam: General exam: In no acute distress. Respiratory system: Good air movement and clear to auscultation. Cardiovascular system: S1 & S2 heard, RRR. No JVD. Gastrointestinal system: Abdomen is nondistended, soft and nontender.  Extremities: No pedal edema. Skin: No rashes, lesions or ulcers Psychiatry: Judgement and insight appear normal. Mood & affect appropriate.    Data Reviewed:    Labs: Basic Metabolic Panel: Recent Labs  Lab 05/25/23 2259 05/26/23 0637  NA 136 139  K 3.0* 4.0  CL 105 107  CO2 25 26  GLUCOSE 121* 96  BUN 13 14  CREATININE 1.00 0.94  CALCIUM 8.9 8.4*  MG  --  2.2   GFR Estimated Creatinine Clearance: 100.1 mL/min (by C-G formula based on SCr of 0.94 mg/dL). Liver Function Tests: Recent Labs  Lab 05/25/23 2259 05/26/23 0637  AST 19 15  ALT 15 14  ALKPHOS 64 55  BILITOT 0.6 0.5  PROT 7.6 6.3*  ALBUMIN 4.0 3.4*   Recent Labs  Lab 05/25/23 2259  LIPASE 1,314*   No results for input(s): "AMMONIA" in the last 168 hours. Coagulation profile No results for input(s): "INR", "PROTIME" in the last 168 hours. COVID-19 Labs  No results for input(s): "DDIMER", "FERRITIN", "LDH", "CRP" in the last 72 hours.  Lab Results  Component Value Date   SARSCOV2NAA NEGATIVE 12/13/2021    SARSCOV2NAA NEGATIVE 10/08/2021   SARSCOV2NAA NEGATIVE 09/17/2021   SARSCOV2NAA NEGATIVE 12/12/2020    CBC: Recent Labs  Lab 05/25/23 2259 05/26/23 0637  WBC 4.7 4.3  HGB 13.8 12.5*  HCT 40.6 38.1*  MCV 85.7 88.8  PLT 281 235   Cardiac Enzymes: No results for input(s): "CKTOTAL", "CKMB", "CKMBINDEX", "TROPONINI" in the last 168 hours. BNP (last 3 results) No results for input(s): "PROBNP" in the last 8760 hours. CBG: No results for input(s): "GLUCAP" in the last 168 hours. D-Dimer: No results for input(s): "DDIMER" in the last 72 hours. Hgb A1c: No results for input(s): "HGBA1C" in the last 72 hours. Lipid Profile: No results for input(s): "CHOL", "HDL", "LDLCALC", "TRIG", "CHOLHDL", "LDLDIRECT" in the last 72 hours. Thyroid function studies: No results for input(s): "TSH", "T4TOTAL", "T3FREE", "THYROIDAB" in the last 72 hours.  Invalid input(s): "FREET3" Anemia work up: No  results for input(s): "VITAMINB12", "FOLATE", "FERRITIN", "TIBC", "IRON", "RETICCTPCT" in the last 72 hours. Sepsis Labs: Recent Labs  Lab 05/25/23 2259 05/26/23 0637  WBC 4.7 4.3   Microbiology No results found for this or any previous visit (from the past 240 hour(s)).   Medications:    amLODipine  10 mg Oral Daily   enoxaparin (LOVENOX) injection  40 mg Subcutaneous Q24H   hydrALAZINE  100 mg Oral TID   pantoprazole  40 mg Oral Daily   Continuous Infusions:  sodium chloride Stopped (05/26/23 0335)   sodium chloride 150 mL/hr at 05/26/23 0444      LOS: 0 days   Marinda Elk  [facility]  05/26/2023, 7:26 AM

## 2023-05-26 NOTE — ED Notes (Signed)
ED TO INPATIENT HANDOFF REPORT  ED Nurse Name and Phone #: Darryll Capers [phone]  S Name/Age/Gender Max Ayers 52 y.o. male Room/Bed: MH10/MH10  Code Status   Code Status: Prior  Home/SNF/Other Home Patient oriented to: self, place, time, and situation Is this baseline? Yes   Triage Complete: Triage complete  Chief Complaint Acute pancreatitis [K85.90]  Triage Note For the past few days pt has had RUQ pain after eating but today after eating it became severe.  Pt also c/o N/V.   Pt has hx of pancreatitis but still has Gallbladder.    Allergies Allergies  Allergen Reactions   Yellow Jacket Venom [Bee Venom] Anaphylaxis and Hives   Gadolinium Derivatives Nausea Only    2nd time in a row. Pt received MRI contrast and immediately experienced nausea.     Level of Care/Admitting Diagnosis ED Disposition     ED Disposition  Admit   Condition  --   Comment  Hospital Area: [facility] [[id number]]  Level of Care: Med-Surg [16]  May admit patient to [facility] or [facility] if equivalent level of care is available:: Yes  Interfacility transfer: Yes  Covid Evaluation: Asymptomatic - no recent exposure (last 10 days) testing not required  Diagnosis: Acute pancreatitis [577.0.ICD-9-CM]  Admitting Physician: Darlin Drop [[id number]]  Attending Physician: Darlin Drop [[id number]]  Certification:: I certify this patient will need inpatient services for at least 2 midnights  Estimated Length of Stay: 2          B Medical/Surgery History Past Medical History:  Diagnosis Date   Acute alcoholic pancreatitis 12/14/2021   Hypertension    Pancreatitis    Tobacco use 12/12/2020   Past Surgical History:  Procedure Laterality Date   NO PAST SURGERIES       A IV Location/Drains/Wounds Patient Lines/Drains/Airways Status     Active Line/Drains/Airways     Name Placement date Placement time Site Days   Peripheral IV 05/25/23 20 G 1"  Anterior;Distal;Left Forearm 05/25/23  2305  Forearm  1            Intake/Output Last 24 hours No intake or output data in the 24 hours ending 05/26/23 0116  Labs/Imaging Results for orders placed or performed during the hospital encounter of 05/25/23 (from the past 48 hour(s))  Lipase, blood     Status: Abnormal   Collection Time: 05/25/23 10:59 PM  Result Value Ref Range   Lipase 1,314 (H) 11 - 51 U/L    Comment: RESULTS CONFIRMED BY MANUAL DILUTION Performed at [facility], [address] [place], [place] [address]   Comprehensive metabolic panel     Status: Abnormal   Collection Time: 05/25/23 10:59 PM  Result Value Ref Range   Sodium 136 135 - 145 mmol/L   Potassium 3.0 (L) 3.5 - 5.1 mmol/L   Chloride 105 98 - 111 mmol/L   CO2 25 22 - 32 mmol/L   Glucose, Bld 121 (H) 70 - 99 mg/dL    Comment: Glucose reference range applies only to samples taken after fasting for at least 8 hours.   BUN 13 6 - 20 mg/dL   Creatinine, Ser 6.57 0.61 - 1.24 mg/dL   Calcium 8.9 8.9 - 84.6 mg/dL   Total Protein 7.6 6.5 - 8.1 g/dL   Albumin 4.0 3.5 - 5.0 g/dL   AST 19 15 - 41 U/L   ALT 15 0 - 44 U/L   Alkaline Phosphatase 64  38 - 126 U/L   Total Bilirubin 0.6 0.3 - 1.2 mg/dL   GFR, Estimated >78 >29 mL/min    Comment: (NOTE) Calculated using the CKD-EPI Creatinine Equation (2021)    Anion gap 6 5 - 15    Comment: Performed at [facility], [address] [place], [place] [address]  CBC     Status: None   Collection Time: 05/25/23 10:59 PM  Result Value Ref Range   WBC 4.7 4.0 - 10.5 K/uL   RBC 4.74 4.22 - 5.81 MIL/uL   Hemoglobin 13.8 13.0 - 17.0 g/dL   HCT 08.6 57.8 - 46.9 %   MCV 85.7 80.0 - 100.0 fL   MCH 29.1 26.0 - 34.0 pg   MCHC 34.0 30.0 - 36.0 g/dL   RDW 62.9 52.8 - 41.3 %   Platelets 281 150 - 400 K/uL   nRBC 0.0 0.0 - 0.2 %    Comment: Performed at [facility], [address] [place], [place] [address]  Urinalysis, Routine w  reflex microscopic -Urine, Clean Catch     Status: Abnormal   Collection Time: 05/25/23 10:59 PM  Result Value Ref Range   Color, Urine YELLOW YELLOW   APPearance CLEAR CLEAR   Specific Gravity, Urine >=1.030 1.005 - 1.030   pH 6.0 5.0 - 8.0   Glucose, UA NEGATIVE NEGATIVE mg/dL   Hgb urine dipstick NEGATIVE NEGATIVE   Bilirubin Urine SMALL (A) NEGATIVE   Ketones, ur NEGATIVE NEGATIVE mg/dL   Protein, ur 30 (A) NEGATIVE mg/dL   Nitrite NEGATIVE NEGATIVE   Leukocytes,Ua NEGATIVE NEGATIVE    Comment: Performed at [facility], [address] [place], [place] [address]  Urinalysis, Microscopic (reflex)     Status: Abnormal   Collection Time: 05/25/23 10:59 PM  Result Value Ref Range   RBC / HPF 0-5 0 - 5 RBC/hpf   WBC, UA 0-5 0 - 5 WBC/hpf   Bacteria, UA RARE (A) NONE SEEN   Squamous Epithelial / HPF 0-5 0 - 5 /HPF    Comment: Performed at [facility], [address] [place], [place] [address]   US Abdomen Limited  Result Date: 05/25/2023 CLINICAL DATA:  Epigastric/right upper quadrant pain. EXAM: ULTRASOUND ABDOMEN LIMITED RIGHT UPPER QUADRANT COMPARISON:  Multiple priors, most recent CT and ultrasound 05/02/2023. MRCP 01/03/2023 FINDINGS: Gallbladder: Physiologically distended. No gallstones or wall thickening visualized. No sonographic Murphy sign noted by sonographer. Common bile duct: Diameter: 7 mm in the midportion, flaring distally at 10 mm. Liver: No intrahepatic biliary ductal dilatation. No focal lesion identified. Within normal limits in parenchymal echogenicity. Portal vein is patent on color Doppler imaging with normal direction of blood flow towards the liver. Other: Known cystic lesion in the pancreatic head, previously characterized as pseudocyst. No right upper quadrant ascites. IMPRESSION: 1. Normal sonographic appearance of the gallbladder. 2. Mild flaring of the distal common bile duct. 3. Known pancreatic pseudocyst. Electronically Signed   By:  Narda Rutherford M.D.   On: 05/25/2023 23:50    Pending Labs Unresulted Labs (From admission, onward)    None       Vitals/Pain Today's Vitals   05/25/23 2254 05/25/23 2255  BP: (!) 162/93   Pulse: 78   Resp: 16   Temp: 98.5 F (36.9 C)   TempSrc: Oral   SpO2: 96%   Weight:  90.7 kg  Height:  5\' 11"  (1.803 m)  PainSc:  7  Isolation Precautions No active isolations  Medications Medications  potassium chloride 10 mEq in 100 mL IVPB (10 mEq Intravenous New Bag/Given 05/26/23 0057)  0.9 %  sodium chloride infusion ( Intravenous New Bag/Given 05/26/23 0057)  fentaNYL (SUBLIMAZE) injection 50 mcg (50 mcg Intravenous Given 05/26/23 0040)  droperidol (INAPSINE) 2.5 MG/ML injection 1.25 mg (1.25 mg Intravenous Given 05/26/23 0042)    Mobility walks     Focused Assessments GI   R Recommendations: See Admitting Provider Note  Report given to:   Additional Notes: Pt is A&O x4 and in no pain at this time.  Getting 2 runs of Potassium.

## 2023-05-26 NOTE — H&P (Addendum)
History and Physical    Max Ayers MRN:[id number] DOB: 05/22/1971 DOA: 05/25/2023  PCP: Patient, No Pcp Per   Patient coming from: Home   Chief Complaint: Abdominal pain, N/V   HPI: Max Ayers is a pleasant 51 y.o. male with medical history significant for alcohol abuse in remission, hypertension, and recurrent pancreatitis with pancreatic pseudocyst who presents to the emergency department with abdominal pain, nausea, and vomiting.  Patient developed right upper quadrant abdominal pain with radiation through to his back on 05/16/2023.  Pain was intermittent and tolerable initially but has been constant and severe since shortly after eating yesterday.  He has been experiencing nausea with recurrent episodes of nonbloody vomiting as well.  He denies any fevers or diarrhea.    His last alcoholic beverage was more than a year ago. He is followed by [facility].   ED Course: Upon arrival to the ED, patient is found to be afebrile and saturating well on room air with stable blood pressure.  EKG demonstrates sinus rhythm.  Labs are most notable for potassium 3.0, lipase 1314, and normal LFTs.  Ultrasound redemonstrates the known pancreatic pseudocyst.  Patient was given fentanyl, droperidol, and potassium in the emergency department.  He was transferred to [facility] for admission.  Review of Systems:  All other systems reviewed and apart from HPI, are negative.  Past Medical History:  Diagnosis Date   Acute alcoholic pancreatitis 12/14/2021   Hypertension    Pancreatitis    Tobacco use 12/12/2020    Past Surgical History:  Procedure Laterality Date   NO PAST SURGERIES      Social History:   reports that he has quit smoking. His smoking use included cigarettes and cigars. He smoked an average of 1 pack per day. He has never used smokeless tobacco. He reports that he does not currently use alcohol. He reports that he does not use drugs.  Allergies  Allergen Reactions    Yellow Jacket Venom [Bee Venom] Anaphylaxis and Hives   Gadolinium Derivatives Nausea Only    2nd time in a row. Pt received MRI contrast and immediately experienced nausea.     Family History  Problem Relation Age of Onset   Diabetes Mother    Hypertension Father    Breast cancer Maternal Grandmother    Colon cancer Neg Hx    Stomach cancer Neg Hx    Liver disease Neg Hx    Pancreatic cancer Neg Hx    Esophageal cancer Neg Hx    Rectal cancer Neg Hx      Prior to Admission medications   Medication Sig Start Date End Date Taking? Authorizing Provider  amLODipine (NORVASC) 10 MG tablet Take 1 tablet (10 mg total) by mouth daily. 04/15/23   Lancaster, William C, MD  docusate sodium (COLACE) 100 MG capsule Take 1 capsule (100 mg total) by mouth 2 (two) times daily. 04/15/23   Lancaster, William C, MD  EPINEPHrine 0.3 mg/0.3 mL IJ SOAJ injection Inject 0.3 mg into the muscle as needed for anaphylaxis. 06/11/19   [provider]  hydrALAZINE (APRESOLINE) 100 MG tablet Take 1 tablet (100 mg total) by mouth 3 (three) times daily. 04/15/23   Lancaster, William C, MD  Hyprom-Naphaz-Polysorb-Zn Sulf (CLEAR EYES COMPLETE) SOLN Place 1 drop into both eyes as needed (for irritation).    [provider]  naproxen (NAPROSYN) 375 MG tablet Take 1 tablet (375 mg total) by mouth 2 (two) times daily. 05/13/23   Knapp, Jon, MD    ondansetron (ZOFRAN) 4 MG tablet Take 1 tablet (4 mg total) by mouth every 6 (six) hours as needed for nausea. 04/15/23   Lancaster, William C, MD  ondansetron (ZOFRAN-ODT) 8 MG disintegrating tablet Take 1 tablet (8 mg total) by mouth every 8 (eight) hours as needed for nausea or vomiting. 05/13/23   Knapp, Jon, MD  oxyCODONE (ROXICODONE) 5 MG immediate release tablet Take 1 tablet (5 mg total) by mouth every 4 (four) hours as needed for severe pain. 05/02/23   Harris, Abigail, PA-C  oxyCODONE-acetaminophen (PERCOCET/ROXICET) 5-325 MG tablet Take 1 tablet by mouth every 6 (six)  hours as needed for severe pain. 05/13/23   Knapp, Jon, MD  pantoprazole (PROTONIX) 40 MG tablet Take 1 tablet (40 mg total) by mouth 2 (two) times daily as needed (heartburn). 04/15/23 07/14/23  Lancaster, William C, MD  polyethylene glycol (MIRALAX / GLYCOLAX) 17 g packet Take 17 g by mouth 2 (two) times daily. 04/15/23   Lancaster, William C, MD    Physical Exam: Vitals:   05/26/23 0235 05/26/23 0245 05/26/23 0336 05/26/23 0357  BP:  (!) 100/54 (!) 155/82 128/78  Pulse:  63 (!) 47 (!) 46  Resp:  12 18   Temp: 98.3 F (36.8 C)  97.6 F (36.4 C)   TempSrc: Oral  Oral   SpO2:  99% 98% 97%  Weight:      Height:         Constitutional: NAD, calm  Eyes: PERTLA, lids and conjunctivae normal ENMT: Mucous membranes are moist. Posterior pharynx clear of any exudate or lesions.   Neck: supple, no masses  Respiratory: no wheezing, no crackles. No accessory muscle use.  Cardiovascular: S1 & S2 heard, regular rate and rhythm. No extremity edema.   Abdomen: Soft, tender in RUQ, no rebound pain or guarding. Bowel sounds active.  Musculoskeletal: no clubbing / cyanosis. No joint deformity upper and lower extremities.   Skin: no significant rashes, lesions, ulcers. Warm, dry, well-perfused. Neurologic: CN 2-12 grossly intact. Moving all extremities. Alert and oriented.  Psychiatric: Pleasant. Cooperative.    Labs and Imaging on Admission: I have personally reviewed following labs and imaging studies  CBC: Recent Labs  Lab 05/25/23 2259  WBC 4.7  HGB 13.8  HCT 40.6  MCV 85.7  PLT 281   Basic Metabolic Panel: Recent Labs  Lab 05/25/23 2259  NA 136  K 3.0*  CL 105  CO2 25  GLUCOSE 121*  BUN 13  CREATININE 1.00  CALCIUM 8.9   GFR: Estimated Creatinine Clearance: 101.9 mL/min (by C-G formula based on SCr of 1 mg/dL). Liver Function Tests: Recent Labs  Lab 05/25/23 2259  AST 19  ALT 15  ALKPHOS 64  BILITOT 0.6  PROT 7.6  ALBUMIN 4.0   Recent Labs  Lab 05/25/23 2259   LIPASE 1,314*   No results for input(s): "AMMONIA" in the last 168 hours. Coagulation Profile: No results for input(s): "INR", "PROTIME" in the last 168 hours. Cardiac Enzymes: No results for input(s): "CKTOTAL", "CKMB", "CKMBINDEX", "TROPONINI" in the last 168 hours. BNP (last 3 results) No results for input(s): "PROBNP" in the last 8760 hours. HbA1C: No results for input(s): "HGBA1C" in the last 72 hours. CBG: No results for input(s): "GLUCAP" in the last 168 hours. Lipid Profile: No results for input(s): "CHOL", "HDL", "LDLCALC", "TRIG", "CHOLHDL", "LDLDIRECT" in the last 72 hours. Thyroid Function Tests: No results for input(s): "TSH", "T4TOTAL", "FREET4", "T3FREE", "THYROIDAB" in the last 72 hours. Anemia Panel: No results for   input(s): "VITAMINB12", "FOLATE", "FERRITIN", "TIBC", "IRON", "RETICCTPCT" in the last 72 hours. Urine analysis:    Component Value Date/Time   COLORURINE YELLOW 05/25/2023 2259   APPEARANCEUR CLEAR 05/25/2023 2259   LABSPEC >=1.030 05/25/2023 2259   PHURINE 6.0 05/25/2023 2259   GLUCOSEU NEGATIVE 05/25/2023 2259   HGBUR NEGATIVE 05/25/2023 2259   BILIRUBINUR SMALL (A) 05/25/2023 2259   KETONESUR NEGATIVE 05/25/2023 2259   PROTEINUR 30 (A) 05/25/2023 2259   UROBILINOGEN 0.2 10/27/2011 0957   NITRITE NEGATIVE 05/25/2023 2259   LEUKOCYTESUR NEGATIVE 05/25/2023 2259   Sepsis Labs: @LABRCNTIP(procalcitonin:4,lacticidven:4) )No results found for this or any previous visit (from the past 240 hour(s)).   Radiological Exams on Admission: US Abdomen Limited  Result Date: 05/25/2023 CLINICAL DATA:  Epigastric/right upper quadrant pain. EXAM: ULTRASOUND ABDOMEN LIMITED RIGHT UPPER QUADRANT COMPARISON:  Multiple priors, most recent CT and ultrasound 05/02/2023. MRCP 01/03/2023 FINDINGS: Gallbladder: Physiologically distended. No gallstones or wall thickening visualized. No sonographic Murphy sign noted by sonographer. Common bile duct: Diameter: 7 mm in the  midportion, flaring distally at 10 mm. Liver: No intrahepatic biliary ductal dilatation. No focal lesion identified. Within normal limits in parenchymal echogenicity. Portal vein is patent on color Doppler imaging with normal direction of blood flow towards the liver. Other: Known cystic lesion in the pancreatic head, previously characterized as pseudocyst. No right upper quadrant ascites. IMPRESSION: 1. Normal sonographic appearance of the gallbladder. 2. Mild flaring of the distal common bile duct. 3. Known pancreatic pseudocyst. Electronically Signed   By: Melanie  Sanford M.D.   On: 05/25/2023 23:50    EKG: Independently reviewed. Sinus rhythm.   Assessment/Plan   1. Acute pancreatitis; pancreatic pseudocyst  - Lipase 1314 on admission  - LFTs are normal and no gallstones noted on US; he reports no alcohol in >1 yr   - Check triglycerides, continue bowel rest, IVF hydration, and pain-control    2. Hypokalemia  - Replacing, will repeat chem panel in am    3. Hypertension  - Continue Norvasc and hydralazine as tolerated     DVT prophylaxis: Lovenox  Code Status: Full  Level of Care: Level of care: Med-Surg Family Communication: none present  Disposition Plan:  Patient is from: home  Anticipated d/c is to: Home  Anticipated d/c date is: 05/29/23  Patient currently: pending pain-control, tolerance of adequate oral intake  Consults called: None  Admission status: Inpatient     Delwyn Scoggin S Nil Xiong, MD [facility]  05/26/2023, 4:15 AM    

## 2023-05-26 NOTE — ED Provider Notes (Signed)
[place] [facility] Provider Note   CSN: [id number] Arrival date & time: 05/25/23  2243     History  Chief Complaint  Patient presents with   Abdominal Pain    Max Ayers is a 52 y.o. male.  The history is provided by the patient.  Abdominal Pain Pain location:  RUQ Pain radiates to:  Does not radiate Pain severity:  Severe Onset quality:  Sudden Duration:  1 day Timing:  Constant Progression:  Unchanged Chronicity:  Recurrent Context: eating   Context comment:  Ate [place] steak Relieved by:  Nothing Worsened by:  Nothing Ineffective treatments:  None tried Associated symptoms: no fever   Risk factors: no NSAID use   Patient with pancreatitis presents with same after eating [place] steak.  No fevers.       Home Medications Prior to Admission medications   Medication Sig Start Date End Date Taking? Authorizing Provider  amLODipine (NORVASC) 10 MG tablet Take 1 tablet (10 mg total) by mouth daily. 04/15/23   Azucena Fallen, MD  docusate sodium (COLACE) 100 MG capsule Take 1 capsule (100 mg total) by mouth 2 (two) times daily. 04/15/23   Azucena Fallen, MD  EPINEPHrine 0.3 mg/0.3 mL IJ SOAJ injection Inject 0.3 mg into the muscle as needed for anaphylaxis. 06/11/19   [provider]  hydrALAZINE (APRESOLINE) 100 MG tablet Take 1 tablet (100 mg total) by mouth 3 (three) times daily. 04/15/23   Azucena Fallen, MD  Hyprom-Naphaz-Polysorb-Zn Sulf (CLEAR EYES COMPLETE) SOLN Place 1 drop into both eyes as needed (for irritation).    [provider]  naproxen (NAPROSYN) 375 MG tablet Take 1 tablet (375 mg total) by mouth 2 (two) times daily. 05/13/23   Linwood Dibbles, MD  ondansetron (ZOFRAN) 4 MG tablet Take 1 tablet (4 mg total) by mouth every 6 (six) hours as needed for nausea. 04/15/23   Azucena Fallen, MD  ondansetron (ZOFRAN-ODT) 8 MG disintegrating tablet Take 1 tablet (8 mg total) by mouth every 8  (eight) hours as needed for nausea or vomiting. 05/13/23   Linwood Dibbles, MD  oxyCODONE (ROXICODONE) 5 MG immediate release tablet Take 1 tablet (5 mg total) by mouth every 4 (four) hours as needed for severe pain. 05/02/23   Arthor Captain, PA-C  oxyCODONE-acetaminophen (PERCOCET/ROXICET) 5-325 MG tablet Take 1 tablet by mouth every 6 (six) hours as needed for severe pain. 05/13/23   Linwood Dibbles, MD  pantoprazole (PROTONIX) 40 MG tablet Take 1 tablet (40 mg total) by mouth 2 (two) times daily as needed (heartburn). 04/15/23 07/14/23  Azucena Fallen, MD  polyethylene glycol (MIRALAX / GLYCOLAX) 17 g packet Take 17 g by mouth 2 (two) times daily. 04/15/23   Azucena Fallen, MD      Allergies    Yellow jacket venom [bee venom] and Gadolinium derivatives    Review of Systems   Review of Systems  Constitutional:  Negative for fever.  HENT:  Negative for facial swelling.   Eyes:  Negative for redness.  Gastrointestinal:  Positive for abdominal pain.  All other systems reviewed and are negative.   Physical Exam Updated Vital Signs BP 113/67   Pulse 70   Temp 98.3 F (36.8 C) (Oral)   Resp 11   Ht 5\' 11"  (1.803 m)   Wt 90.7 kg   SpO2 99%   BMI 27.89 kg/m  Physical Exam Vitals and nursing note reviewed.  Constitutional:  General: He is not in acute distress.    Appearance: He is well-developed. He is not diaphoretic.  HENT:     Head: Normocephalic and atraumatic.  Eyes:     Conjunctiva/sclera: Conjunctivae normal.     Pupils: Pupils are equal, round, and reactive to light.  Cardiovascular:     Rate and Rhythm: Normal rate and regular rhythm.  Pulmonary:     Effort: Pulmonary effort is normal.     Breath sounds: Normal breath sounds. No wheezing or rales.  Abdominal:     General: Bowel sounds are normal.     Palpations: Abdomen is soft.     Tenderness: There is abdominal tenderness. There is no guarding or rebound. Negative signs include Murphy's sign.     Comments: Mild  diffuse   Musculoskeletal:        General: Normal range of motion.     Cervical back: Normal range of motion and neck supple.  Skin:    General: Skin is warm and dry.     Capillary Refill: Capillary refill takes less than 2 seconds.  Neurological:     General: No focal deficit present.     Mental Status: He is alert and oriented to person, place, and time.  Psychiatric:        Mood and Affect: Mood normal.        Behavior: Behavior normal.     ED Results / Procedures / Treatments   Labs (all labs ordered are listed, but only abnormal results are displayed) Results for orders placed or performed during the hospital encounter of 05/25/23  Lipase, blood  Result Value Ref Range   Lipase 1,314 (H) 11 - 51 U/L  Comprehensive metabolic panel  Result Value Ref Range   Sodium 136 135 - 145 mmol/L   Potassium 3.0 (L) 3.5 - 5.1 mmol/L   Chloride 105 98 - 111 mmol/L   CO2 25 22 - 32 mmol/L   Glucose, Bld 121 (H) 70 - 99 mg/dL   BUN 13 6 - 20 mg/dL   Creatinine, Ser 1.61 0.61 - 1.24 mg/dL   Calcium 8.9 8.9 - 09.6 mg/dL   Total Protein 7.6 6.5 - 8.1 g/dL   Albumin 4.0 3.5 - 5.0 g/dL   AST 19 15 - 41 U/L   ALT 15 0 - 44 U/L   Alkaline Phosphatase 64 38 - 126 U/L   Total Bilirubin 0.6 0.3 - 1.2 mg/dL   GFR, Estimated >04 >54 mL/min   Anion gap 6 5 - 15  CBC  Result Value Ref Range   WBC 4.7 4.0 - 10.5 K/uL   RBC 4.74 4.22 - 5.81 MIL/uL   Hemoglobin 13.8 13.0 - 17.0 g/dL   HCT 09.8 11.9 - 14.7 %   MCV 85.7 80.0 - 100.0 fL   MCH 29.1 26.0 - 34.0 pg   MCHC 34.0 30.0 - 36.0 g/dL   RDW 82.9 56.2 - 13.0 %   Platelets 281 150 - 400 K/uL   nRBC 0.0 0.0 - 0.2 %  Urinalysis, Routine w reflex microscopic -Urine, Clean Catch  Result Value Ref Range   Color, Urine YELLOW YELLOW   APPearance CLEAR CLEAR   Specific Gravity, Urine >=1.030 1.005 - 1.030   pH 6.0 5.0 - 8.0   Glucose, UA NEGATIVE NEGATIVE mg/dL   Hgb urine dipstick NEGATIVE NEGATIVE   Bilirubin Urine SMALL (A) NEGATIVE    Ketones, ur NEGATIVE NEGATIVE mg/dL   Protein, ur 30 (A) NEGATIVE mg/dL  Nitrite NEGATIVE NEGATIVE   Leukocytes,Ua NEGATIVE NEGATIVE  Urinalysis, Microscopic (reflex)  Result Value Ref Range   RBC / HPF 0-5 0 - 5 RBC/hpf   WBC, UA 0-5 0 - 5 WBC/hpf   Bacteria, UA RARE (A) NONE SEEN   Squamous Epithelial / HPF 0-5 0 - 5 /HPF   US Abdomen Limited  Result Date: 05/25/2023 CLINICAL DATA:  Epigastric/right upper quadrant pain. EXAM: ULTRASOUND ABDOMEN LIMITED RIGHT UPPER QUADRANT COMPARISON:  Multiple priors, most recent CT and ultrasound 05/02/2023. MRCP 01/03/2023 FINDINGS: Gallbladder: Physiologically distended. No gallstones or wall thickening visualized. No sonographic Murphy sign noted by sonographer. Common bile duct: Diameter: 7 mm in the midportion, flaring distally at 10 mm. Liver: No intrahepatic biliary ductal dilatation. No focal lesion identified. Within normal limits in parenchymal echogenicity. Portal vein is patent on color Doppler imaging with normal direction of blood flow towards the liver. Other: Known cystic lesion in the pancreatic head, previously characterized as pseudocyst. No right upper quadrant ascites. IMPRESSION: 1. Normal sonographic appearance of the gallbladder. 2. Mild flaring of the distal common bile duct. 3. Known pancreatic pseudocyst. Electronically Signed   By: Narda Rutherford M.D.   On: 05/25/2023 23:50   CT ABDOMEN PELVIS W CONTRAST  Result Date: 05/02/2023 CLINICAL DATA:  Abdomen pain EXAM: CT ABDOMEN AND PELVIS WITH CONTRAST TECHNIQUE: Multidetector CT imaging of the abdomen and pelvis was performed using the standard protocol following bolus administration of intravenous contrast. RADIATION DOSE REDUCTION: This exam was performed according to the departmental dose-optimization program which includes automated exposure control, adjustment of the mA and/or kV according to patient size and/or use of iterative reconstruction technique. CONTRAST:   OMNIPAQUE IOHEXOL 300 MG/ML  SOLN COMPARISON:  04/11/2023, 03/08/2023, MRI 01/03/2023, CT 08/16/2020 FINDINGS: Lower chest: Lung bases demonstrate no acute airspace disease. Mild cardiomegaly. Hepatobiliary: No focal liver abnormality is seen. No gallstones, gallbladder wall thickening, or biliary dilatation. Pancreas: Mild diffuse ductal dilatation as before. Minimal fat stranding at the uncinate process of the pancreas. Cystic lesion at the uncinate process measuring 1.8 by 1.2 cm, stable since 2021. Spleen: Normal in size without focal abnormality. Adrenals/Urinary Tract: Adrenal glands are normal. Kidneys show no hydronephrosis. The bladder is unremarkable Stomach/Bowel: The stomach is nonenlarged. No dilated small bowel. No acute bowel wall thickening. Vascular/Lymphatic: Moderate aortic atherosclerosis. No aneurysm. No suspicious lymph nodes Reproductive: Prostate is unremarkable. Other: Negative for pelvic effusion or free air. Musculoskeletal: No acute or suspicious osseous abnormality IMPRESSION: 1. Minimal fat stranding at the uncinate process of the pancreas, possible mild pancreatitis. 2. Stable 1.8 cm cystic lesion at the uncinate process of the pancreas. Follow-up as per MRI 01/03/2023. 3. Cardiomegaly. 4. Aortic atherosclerosis. Aortic Atherosclerosis (ICD10-I70.0). Electronically Signed   By: Jasmine Pang M.D.   On: 05/02/2023 21:59   US Abdomen Limited RUQ (LIVER/GB)  Result Date: 05/02/2023 CLINICAL DATA:  Right upper quadrant pain EXAM: ULTRASOUND ABDOMEN LIMITED RIGHT UPPER QUADRANT COMPARISON:  CT abdomen and pelvis 04/11/2023 and ultrasound 03/09/2023 FINDINGS: Gallbladder: No gallstones or wall thickening visualized. No sonographic Murphy sign noted by sonographer. Common bile duct: Diameter: 7 mm.  No intrahepatic biliary dilation. Liver: No focal lesion identified. Within normal limits in parenchymal echogenicity. Portal vein is patent on color Doppler imaging with normal direction of  blood flow towards the liver. Other: None. IMPRESSION: No sonographic correlate for right upper quadrant pain. Electronically Signed   By: Minerva Fester M.D.   On: 05/02/2023 20:04   DG Chest 2 View  Result Date: 05/02/2023 CLINICAL DATA:  Right-sided lower chest pain. EXAM: CHEST - 2 VIEW COMPARISON:  February 12, 2023 FINDINGS: The heart size and mediastinal contours are within normal limits. Both lungs are clear. The visualized skeletal structures are unremarkable. IMPRESSION: No active cardiopulmonary disease. Electronically Signed   By: Aram Candela M.D.   On: 05/02/2023 18:33    None  Radiology US Abdomen Limited  Result Date: 05/25/2023 CLINICAL DATA:  Epigastric/right upper quadrant pain. EXAM: ULTRASOUND ABDOMEN LIMITED RIGHT UPPER QUADRANT COMPARISON:  Multiple priors, most recent CT and ultrasound 05/02/2023. MRCP 01/03/2023 FINDINGS: Gallbladder: Physiologically distended. No gallstones or wall thickening visualized. No sonographic Murphy sign noted by sonographer. Common bile duct: Diameter: 7 mm in the midportion, flaring distally at 10 mm. Liver: No intrahepatic biliary ductal dilatation. No focal lesion identified. Within normal limits in parenchymal echogenicity. Portal vein is patent on color Doppler imaging with normal direction of blood flow towards the liver. Other: Known cystic lesion in the pancreatic head, previously characterized as pseudocyst. No right upper quadrant ascites. IMPRESSION: 1. Normal sonographic appearance of the gallbladder. 2. Mild flaring of the distal common bile duct. 3. Known pancreatic pseudocyst. Electronically Signed   By: Narda Rutherford M.D.   On: 05/25/2023 23:50    Procedures Procedures    Medications Ordered in ED Medications  potassium chloride 10 mEq in 100 mL IVPB (10 mEq Intravenous New Bag/Given 05/26/23 0149)  0.9 %  sodium chloride infusion ( Intravenous New Bag/Given 05/26/23 0057)  fentaNYL (SUBLIMAZE) injection 50 mcg (50 mcg  Intravenous Given 05/26/23 0040)  droperidol (INAPSINE) 2.5 MG/ML injection 1.25 mg (1.25 mg Intravenous Given 05/26/23 0042)    ED Course/ Medical Decision Making/ A&P                             Medical Decision Making Patient with RUQ pain and h/o pancreatitis   Amount and/or Complexity of Data Reviewed External Data Reviewed: labs and notes.    Details: Previous notes and labs reviewed previous lipase 378 Labs: ordered.    Details: Lipase markedly elevated 1314, sodium normal 136, potassium low 3, normal creatinine. Urine is negative for UTI.  Normal white count 4.7, normal hemoglobin 13.8, normal platelets  Radiology: ordered and independent interpretation performed.    Details: No gall stones   Risk Prescription drug management. Decision regarding hospitalization. Risk Details: IVF and pain medication and admission     Final Clinical Impression(s) / ED Diagnoses Final diagnoses:  Acute pancreatitis, unspecified complication status, unspecified pancreatitis type   The patient appears reasonably stabilized for admission considering the current resources, flow, and capabilities available in the ED at this time, and I doubt any other [facility] requiring further screening and/or treatment in the ED prior to admission.  Rx / DC Orders ED Discharge Orders     None         Daquana Paddock, MD 05/26/23 1610

## 2023-05-26 NOTE — H&P (View-Only) (Signed)
History and Physical    Shahan Deptula ZOX:[id number] DOB: 10-24-1971 DOA: 05/25/2023  PCP: Patient, No Pcp Per   Patient coming from: Home   Chief Complaint: Abdominal pain, N/V   HPI: Max Ayers is a pleasant 52 y.o. male with medical history significant for alcohol abuse in remission, hypertension, and recurrent pancreatitis with pancreatic pseudocyst who presents to the emergency department with abdominal pain, nausea, and vomiting.  Patient developed right upper quadrant abdominal pain with radiation through to his back on 05/16/2023.  Pain was intermittent and tolerable initially but has been constant and severe since shortly after eating yesterday.  He has been experiencing nausea with recurrent episodes of nonbloody vomiting as well.  He denies any fevers or diarrhea.    His last alcoholic beverage was more than a year ago. He is followed by [facility].   ED Course: Upon arrival to the ED, patient is found to be afebrile and saturating well on room air with stable blood pressure.  EKG demonstrates sinus rhythm.  Labs are most notable for potassium 3.0, lipase 1314, and normal LFTs.  Ultrasound redemonstrates the known pancreatic pseudocyst.  Patient was given fentanyl, droperidol, and potassium in the emergency department.  He was transferred to [facility] for admission.  Review of Systems:  All other systems reviewed and apart from HPI, are negative.  Past Medical History:  Diagnosis Date   Acute alcoholic pancreatitis 12/14/2021   Hypertension    Pancreatitis    Tobacco use 12/12/2020    Past Surgical History:  Procedure Laterality Date   NO PAST SURGERIES      Social History:   reports that he has quit smoking. His smoking use included cigarettes and cigars. He smoked an average of 1 pack per day. He has never used smokeless tobacco. He reports that he does not currently use alcohol. He reports that he does not use drugs.  Allergies  Allergen Reactions    Yellow Jacket Venom [Bee Venom] Anaphylaxis and Hives   Gadolinium Derivatives Nausea Only    2nd time in a row. Pt received MRI contrast and immediately experienced nausea.     Family History  Problem Relation Age of Onset   Diabetes Mother    Hypertension Father    Breast cancer Maternal Grandmother    Colon cancer Neg Hx    Stomach cancer Neg Hx    Liver disease Neg Hx    Pancreatic cancer Neg Hx    Esophageal cancer Neg Hx    Rectal cancer Neg Hx      Prior to Admission medications   Medication Sig Start Date End Date Taking? Authorizing Provider  amLODipine (NORVASC) 10 MG tablet Take 1 tablet (10 mg total) by mouth daily. 04/15/23   Azucena Fallen, MD  docusate sodium (COLACE) 100 MG capsule Take 1 capsule (100 mg total) by mouth 2 (two) times daily. 04/15/23   Azucena Fallen, MD  EPINEPHrine 0.3 mg/0.3 mL IJ SOAJ injection Inject 0.3 mg into the muscle as needed for anaphylaxis. 06/11/19   [provider]  hydrALAZINE (APRESOLINE) 100 MG tablet Take 1 tablet (100 mg total) by mouth 3 (three) times daily. 04/15/23   Azucena Fallen, MD  Hyprom-Naphaz-Polysorb-Zn Sulf (CLEAR EYES COMPLETE) SOLN Place 1 drop into both eyes as needed (for irritation).    [provider]  naproxen (NAPROSYN) 375 MG tablet Take 1 tablet (375 mg total) by mouth 2 (two) times daily. 05/13/23   Linwood Dibbles, MD  ondansetron (ZOFRAN) 4 MG tablet Take 1 tablet (4 mg total) by mouth every 6 (six) hours as needed for nausea. 04/15/23   Azucena Fallen, MD  ondansetron (ZOFRAN-ODT) 8 MG disintegrating tablet Take 1 tablet (8 mg total) by mouth every 8 (eight) hours as needed for nausea or vomiting. 05/13/23   Linwood Dibbles, MD  oxyCODONE (ROXICODONE) 5 MG immediate release tablet Take 1 tablet (5 mg total) by mouth every 4 (four) hours as needed for severe pain. 05/02/23   Arthor Captain, PA-C  oxyCODONE-acetaminophen (PERCOCET/ROXICET) 5-325 MG tablet Take 1 tablet by mouth every 6 (six)  hours as needed for severe pain. 05/13/23   Linwood Dibbles, MD  pantoprazole (PROTONIX) 40 MG tablet Take 1 tablet (40 mg total) by mouth 2 (two) times daily as needed (heartburn). 04/15/23 07/14/23  Azucena Fallen, MD  polyethylene glycol (MIRALAX / GLYCOLAX) 17 g packet Take 17 g by mouth 2 (two) times daily. 04/15/23   Azucena Fallen, MD    Physical Exam: Vitals:   05/26/23 0235 05/26/23 0245 05/26/23 0336 05/26/23 0357  BP:  (!) 100/54 (!) 155/82 128/78  Pulse:  63 (!) 47 (!) 46  Resp:  12 18   Temp: 98.3 F (36.8 C)  97.6 F (36.4 C)   TempSrc: Oral  Oral   SpO2:  99% 98% 97%  Weight:      Height:         Constitutional: NAD, calm  Eyes: PERTLA, lids and conjunctivae normal ENMT: Mucous membranes are moist. Posterior pharynx clear of any exudate or lesions.   Neck: supple, no masses  Respiratory: no wheezing, no crackles. No accessory muscle use.  Cardiovascular: S1 & S2 heard, regular rate and rhythm. No extremity edema.   Abdomen: Soft, tender in RUQ, no rebound pain or guarding. Bowel sounds active.  Musculoskeletal: no clubbing / cyanosis. No joint deformity upper and lower extremities.   Skin: no significant rashes, lesions, ulcers. Warm, dry, well-perfused. Neurologic: CN 2-12 grossly intact. Moving all extremities. Alert and oriented.  Psychiatric: Pleasant. Cooperative.    Labs and Imaging on Admission: I have personally reviewed following labs and imaging studies  CBC: Recent Labs  Lab 05/25/23 2259  WBC 4.7  HGB 13.8  HCT 40.6  MCV 85.7  PLT 281   Basic Metabolic Panel: Recent Labs  Lab 05/25/23 2259  NA 136  K 3.0*  CL 105  CO2 25  GLUCOSE 121*  BUN 13  CREATININE 1.00  CALCIUM 8.9   GFR: Estimated Creatinine Clearance: 101.9 mL/min (by C-G formula based on SCr of 1 mg/dL). Liver Function Tests: Recent Labs  Lab 05/25/23 2259  AST 19  ALT 15  ALKPHOS 64  BILITOT 0.6  PROT 7.6  ALBUMIN 4.0   Recent Labs  Lab 05/25/23 2259   LIPASE 1,314*   No results for input(s): "AMMONIA" in the last 168 hours. Coagulation Profile: No results for input(s): "INR", "PROTIME" in the last 168 hours. Cardiac Enzymes: No results for input(s): "CKTOTAL", "CKMB", "CKMBINDEX", "TROPONINI" in the last 168 hours. BNP (last 3 results) No results for input(s): "PROBNP" in the last 8760 hours. HbA1C: No results for input(s): "HGBA1C" in the last 72 hours. CBG: No results for input(s): "GLUCAP" in the last 168 hours. Lipid Profile: No results for input(s): "CHOL", "HDL", "LDLCALC", "TRIG", "CHOLHDL", "LDLDIRECT" in the last 72 hours. Thyroid Function Tests: No results for input(s): "TSH", "T4TOTAL", "FREET4", "T3FREE", "THYROIDAB" in the last 72 hours. Anemia Panel: No results for  input(s): "VITAMINB12", "FOLATE", "FERRITIN", "TIBC", "IRON", "RETICCTPCT" in the last 72 hours. Urine analysis:    Component Value Date/Time   COLORURINE YELLOW 05/25/2023 2259   APPEARANCEUR CLEAR 05/25/2023 2259   LABSPEC >=1.030 05/25/2023 2259   PHURINE 6.0 05/25/2023 2259   GLUCOSEU NEGATIVE 05/25/2023 2259   HGBUR NEGATIVE 05/25/2023 2259   BILIRUBINUR SMALL (A) 05/25/2023 2259   KETONESUR NEGATIVE 05/25/2023 2259   PROTEINUR 30 (A) 05/25/2023 2259   UROBILINOGEN 0.2 10/27/2011 0957   NITRITE NEGATIVE 05/25/2023 2259   LEUKOCYTESUR NEGATIVE 05/25/2023 2259   Sepsis Labs: @LABRCNTIP (procalcitonin:4,lacticidven:4) )No results found for this or any previous visit (from the past 240 hour(s)).   Radiological Exams on Admission: US Abdomen Limited  Result Date: 05/25/2023 CLINICAL DATA:  Epigastric/right upper quadrant pain. EXAM: ULTRASOUND ABDOMEN LIMITED RIGHT UPPER QUADRANT COMPARISON:  Multiple priors, most recent CT and ultrasound 05/02/2023. MRCP 01/03/2023 FINDINGS: Gallbladder: Physiologically distended. No gallstones or wall thickening visualized. No sonographic Murphy sign noted by sonographer. Common bile duct: Diameter: 7 mm in the  midportion, flaring distally at 10 mm. Liver: No intrahepatic biliary ductal dilatation. No focal lesion identified. Within normal limits in parenchymal echogenicity. Portal vein is patent on color Doppler imaging with normal direction of blood flow towards the liver. Other: Known cystic lesion in the pancreatic head, previously characterized as pseudocyst. No right upper quadrant ascites. IMPRESSION: 1. Normal sonographic appearance of the gallbladder. 2. Mild flaring of the distal common bile duct. 3. Known pancreatic pseudocyst. Electronically Signed   By: Narda Rutherford M.D.   On: 05/25/2023 23:50    EKG: Independently reviewed. Sinus rhythm.   Assessment/Plan   1. Acute pancreatitis; pancreatic pseudocyst  - Lipase 1314 on admission  - LFTs are normal and no gallstones noted on [place]; he reports no alcohol in >1 yr   - Check triglycerides, continue bowel rest, IVF hydration, and pain-control    2. Hypokalemia  - Replacing, will repeat chem panel in am    3. Hypertension  - Continue Norvasc and hydralazine as tolerated     DVT prophylaxis: Lovenox  Code Status: Full  Level of Care: Level of care: Med-Surg Family Communication: none present  Disposition Plan:  Patient is from: home  Anticipated d/c is to: Home  Anticipated d/c date is: 05/29/23  Patient currently: pending pain-control, tolerance of adequate oral intake  Consults called: None  Admission status: Inpatient     Briscoe Deutscher, MD [facility]  05/26/2023, 4:15 AM

## 2023-05-27 DIAGNOSIS — E876 Hypokalemia: Secondary | ICD-10-CM | POA: Diagnosis not present

## 2023-05-27 DIAGNOSIS — K859 Acute pancreatitis without necrosis or infection, unspecified: Secondary | ICD-10-CM | POA: Diagnosis not present

## 2023-05-27 DIAGNOSIS — I10 Essential (primary) hypertension: Secondary | ICD-10-CM | POA: Diagnosis not present

## 2023-05-27 LAB — CBC
HCT: 40.8 % (ref 39.0–52.0)
Hemoglobin: 13.9 g/dL (ref 13.0–17.0)
MCH: 29.6 pg (ref 26.0–34.0)
MCHC: 34.1 g/dL (ref 30.0–36.0)
MCV: 86.8 fL (ref 80.0–100.0)
Platelets: 258 10*3/uL (ref 150–400)
RBC: 4.7 MIL/uL (ref 4.22–5.81)
RDW: 13 % (ref 11.5–15.5)
WBC: 4.9 10*3/uL (ref 4.0–10.5)
nRBC: 0 % (ref 0.0–0.2)

## 2023-05-27 LAB — COMPREHENSIVE METABOLIC PANEL
ALT: 13 U/L (ref 0–44)
AST: 15 U/L (ref 15–41)
Albumin: 3.7 g/dL (ref 3.5–5.0)
Alkaline Phosphatase: 54 U/L (ref 38–126)
Anion gap: 8 (ref 5–15)
BUN: 6 mg/dL (ref 6–20)
CO2: 23 mmol/L (ref 22–32)
Calcium: 9 mg/dL (ref 8.9–10.3)
Chloride: 108 mmol/L (ref 98–111)
Creatinine, Ser: 0.82 mg/dL (ref 0.61–1.24)
GFR, Estimated: >60 mL/min (ref >60)
Glucose, Bld: 100 mg/dL — ABNORMAL HIGH (ref 70–99)
Potassium: 3.6 mmol/L (ref 3.5–5.1)
Sodium: 139 mmol/L (ref 135–145)
Total Bilirubin: 0.5 mg/dL (ref 0.3–1.2)
Total Protein: 7.1 g/dL (ref 6.5–8.1)

## 2023-05-27 MED ORDER — POTASSIUM CHLORIDE IN NACL 40-0.9 MEQ/L-% IV SOLN
INTRAVENOUS | Status: AC
  Filled 2023-05-27 (×5): qty 1000

## 2023-05-27 MED ORDER — SODIUM CHLORIDE 0.9 % IV SOLN: INTRAVENOUS | Status: DC

## 2023-05-27 MED ORDER — HYDROMORPHONE HCL 1 MG/ML IJ SOLN
0.5000 mg | INTRAMUSCULAR | Status: DC | PRN
  Administered 2023-05-27 (×4): 0.5 mg via INTRAVENOUS
  Filled 2023-05-27 (×4): qty 0.5

## 2023-05-27 NOTE — Progress Notes (Signed)
[facility] PROGRESS NOTE    Progress Note  Ennis Witkowski  BJY:[id number] DOB: [birth year] DOA: 05/25/2023 PCP: Patient, No Pcp Per     Brief Narrative:   Brion Pruet is an 52 y.o. male past medical history significant for alcohol abuse, recurrent pancreatitis and pseudocyst comes into the emergency room for abdominal pain nausea and vomiting, he relates his last alcoholic beverage was more than a year ago lipase of 1300 normal LFTs ultrasound redemonstrate known pancreatic pseudocyst   Assessment/Plan:   Acute pancreatitis/in the setting of pancreatic pseudocyst He was started on clear liquid diet yesterday started vomiting and having abdominal pain. Placed NPO. Continue IV fluids and narcotics for analgesics.  Hypokalemia: Repleted now resolved.  Benign essential HTN Blood pressure is controlled, resume home regimen.  Normocytic anemia: He relates no bloody stools, no signs of overt bleeding follow-up with PCP as an outpatient.  DVT prophylaxis: lovenox Family Communication:None Status is: Inpatient Remains inpatient appropriate because: Acute on chronic pancreatitis.    Code Status:     Code Status Orders  (From admission, onward)           Start     Ordered   05/26/23 0414  Full code  Continuous       Question:  By:  Answer:  Consent: discussion documented in EHR   05/26/23 0414           Code Status History     Date Active Date Inactive Code Status Order ID Comments User Context   04/11/2023 0914 04/15/2023 1959 Full Code [id number]  Maryln Gottron, MD Inpatient   03/09/2023 0036 03/12/2023 1604 Full Code [id number]  Charlsie Quest, MD ED   02/12/2023 1856 02/16/2023 1703 Full Code [id number]  Charlsie Quest, MD Inpatient   12/31/2022 0148 01/04/2023 1942 Full Code [id number]  Carollee Herter, DO Inpatient   12/31/2022 0119 12/31/2022 0148 Full Code [id number]  Carollee Herter, DO Inpatient   11/15/2022 1713 11/22/2022 1835 Full Code [id number]  Noralee Stain, DO  Inpatient   09/06/2022 1808 09/11/2022 1833 Full Code [id number]  Kathlen Mody, MD Inpatient   08/05/2022 1526 08/07/2022 1809 Full Code [id number]  Bobette Mo, MD Inpatient   07/03/2022 2210 07/08/2022 1737 Full Code [id number]  Hillary Bow, DO Inpatient   06/02/2022 1531 06/04/2022 1824 Full Code [id number]  Teddy Spike, DO Inpatient   12/14/2021 1846 12/17/2021 2250 Full Code [id number]  Anselm Jungling, DO Inpatient   10/08/2021 1704 10/11/2021 1840 Full Code [id number]  Teddy Spike, DO Inpatient   09/17/2021 0849 09/20/2021 2036 Full Code [id number]  Bobette Mo, MD ED   12/12/2020 1628 12/15/2020 1838 Full Code [id number]  Clydie Braun, MD ED         IV Access:   Peripheral IV   Procedures and diagnostic studies:   US Abdomen Limited  Result Date: 05/25/2023 CLINICAL DATA:  Epigastric/right upper quadrant pain. EXAM: ULTRASOUND ABDOMEN LIMITED RIGHT UPPER QUADRANT COMPARISON:  Multiple priors, most recent CT and ultrasound 05/02/2023. MRCP 01/03/2023 FINDINGS: Gallbladder: Physiologically distended. No gallstones or wall thickening visualized. No sonographic Murphy sign noted by sonographer. Common bile duct: Diameter: 7 mm in the midportion, flaring distally at 10 mm. Liver: No intrahepatic biliary ductal dilatation. No focal lesion identified. Within normal limits in parenchymal echogenicity. Portal vein is patent on color Doppler imaging with normal direction of blood flow towards the liver. Other: Known cystic lesion in the pancreatic head, previously characterized as pseudocyst. No right upper  quadrant ascites. IMPRESSION: 1. Normal sonographic appearance of the gallbladder. 2. Mild flaring of the distal common bile duct. 3. Known pancreatic pseudocyst. Electronically Signed   By: Narda Rutherford M.D.   On: 05/25/2023 23:50     Medical Consultants:   None.   Subjective:    Delijah Vaquera noted to tolerate his diet, having abdominal pain.  Objective:    Vitals:    05/26/23 1535 05/26/23 1925 05/26/23 2050 05/27/23 0615  BP: 135/79 121/75 138/66 (!) 146/88  Pulse: 86 85 82 93  Resp: 15 16 20 18   Temp: 98.1 F (36.7 C) 98 F (36.7 C) 98.1 F (36.7 C) 99.4 F (37.4 C)  TempSrc: Oral Oral  Oral  SpO2: 100% 99% 93% 99%  Weight:      Height:       SpO2: 99 %   Intake/Output Summary (Last 24 hours) at 05/27/2023 0756 Last data filed at 05/26/2023 1800 Gross per 24 hour  Intake 2303.39 ml  Output 400 ml  Net 1903.39 ml    Filed Weights   05/25/23 2255 05/26/23 0446  Weight: 90.7 kg 86.6 kg    Exam: General exam: In no acute distress. Respiratory system: Good air movement and clear to auscultation. Cardiovascular system: S1 & S2 heard, RRR. No JVD. Gastrointestinal system: Abdomen is nondistended, soft and epigastric tenderness Extremities: No pedal edema. Skin: No rashes, lesions or ulcers Psychiatry: Judgement and insight appear normal. Mood & affect appropriate.  Data Reviewed:    Labs: Basic Metabolic Panel: Recent Labs  Lab 05/25/23 2259 05/26/23 0637 05/27/23 0433  NA 136 139 139  K 3.0* 4.0 3.6  CL 105 107 108  CO2 25 26 23   GLUCOSE 121* 96 100*  BUN 13 14 6   CREATININE 1.00 0.94 0.82  CALCIUM 8.9 8.4* 9.0  MG  --  2.2  --     GFR Estimated Creatinine Clearance: 114.8 mL/min (by C-G formula based on SCr of 0.82 mg/dL). Liver Function Tests: Recent Labs  Lab 05/25/23 2259 05/26/23 0637 05/27/23 0433  AST 19 15 15   ALT 15 14 13   ALKPHOS 64 55 54  BILITOT 0.6 0.5 0.5  PROT 7.6 6.3* 7.1  ALBUMIN 4.0 3.4* 3.7    Recent Labs  Lab 05/25/23 2259  LIPASE 1,314*    No results for input(s): "AMMONIA" in the last 168 hours. Coagulation profile No results for input(s): "INR", "PROTIME" in the last 168 hours. COVID-19 Labs  No results for input(s): "DDIMER", "FERRITIN", "LDH", "CRP" in the last 72 hours.  Lab Results  Component Value Date   SARSCOV2NAA NEGATIVE 12/13/2021   SARSCOV2NAA NEGATIVE  10/08/2021   SARSCOV2NAA NEGATIVE 09/17/2021   SARSCOV2NAA NEGATIVE 12/12/2020    CBC: Recent Labs  Lab 05/25/23 2259 05/26/23 0637 05/27/23 0433  WBC 4.7 4.3 4.9  HGB 13.8 12.5* 13.9  HCT 40.6 38.1* 40.8  MCV 85.7 88.8 86.8  PLT 281 235 258    Cardiac Enzymes: No results for input(s): "CKTOTAL", "CKMB", "CKMBINDEX", "TROPONINI" in the last 168 hours. BNP (last 3 results) No results for input(s): "PROBNP" in the last 8760 hours. CBG: No results for input(s): "GLUCAP" in the last 168 hours. D-Dimer: No results for input(s): "DDIMER" in the last 72 hours. Hgb A1c: No results for input(s): "HGBA1C" in the last 72 hours. Lipid Profile: Recent Labs    05/26/23 0637  TRIG 98   Thyroid function studies: No results for input(s): "TSH", "T4TOTAL", "T3FREE", "THYROIDAB" in the last 72 hours.  Invalid  input(s): "FREET3" Anemia work up: No results for input(s): "VITAMINB12", "FOLATE", "FERRITIN", "TIBC", "IRON", "RETICCTPCT" in the last 72 hours. Sepsis Labs: Recent Labs  Lab 05/25/23 2259 05/26/23 0637 05/27/23 0433  WBC 4.7 4.3 4.9    Microbiology No results found for this or any previous visit (from the past 240 hour(s)).   Medications:    amLODipine  10 mg Oral Daily   enoxaparin (LOVENOX) injection  40 mg Subcutaneous Q24H   hydrALAZINE  100 mg Oral TID   pantoprazole  40 mg Oral Daily   Continuous Infusions:  sodium chloride Stopped (05/26/23 0335)      LOS: 1 day   Marinda Elk  [facility]  05/27/2023, 7:56 AM

## 2023-05-27 NOTE — Plan of Care (Signed)
Patient awake, alert, orient x4. Stated that pain is well controlled with current pain regiment. No nausea or vomiting this shift. Voiding adequate amount of urine. Up ad lib. Safety precautions maintained. Problem: Education: Goal: Knowledge of General Education information will improve Description: Including pain rating scale, medication(s)/side effects and non-pharmacologic comfort measures Outcome: Progressing   Problem: Health Behavior/Discharge Planning: Goal: Ability to manage health-related needs will improve Outcome: Progressing   Problem: Clinical Measurements: Goal: Ability to maintain clinical measurements within normal limits will improve Outcome: Progressing Goal: Will remain free from infection Outcome: Progressing Goal: Diagnostic test results will improve Outcome: Progressing Goal: Respiratory complications will improve Outcome: Progressing Goal: Cardiovascular complication will be avoided Outcome: Progressing   Problem: Activity: Goal: Risk for activity intolerance will decrease Outcome: Progressing   Problem: Nutrition: Goal: Adequate nutrition will be maintained Outcome: Progressing   Problem: Coping: Goal: Level of anxiety will decrease Outcome: Progressing   Problem: Elimination: Goal: Will not experience complications related to bowel motility Outcome: Progressing Goal: Will not experience complications related to urinary retention Outcome: Progressing   Problem: Pain Managment: Goal: General experience of comfort will improve Outcome: Progressing   Problem: Safety: Goal: Ability to remain free from injury will improve Outcome: Progressing   Problem: Skin Integrity: Goal: Risk for impaired skin integrity will decrease Outcome: Progressing

## 2023-05-28 DIAGNOSIS — I10 Essential (primary) hypertension: Secondary | ICD-10-CM | POA: Diagnosis not present

## 2023-05-28 DIAGNOSIS — K859 Acute pancreatitis without necrosis or infection, unspecified: Secondary | ICD-10-CM | POA: Diagnosis not present

## 2023-05-28 LAB — COMPREHENSIVE METABOLIC PANEL
ALT: 13 U/L (ref 0–44)
AST: 14 U/L — ABNORMAL LOW (ref 15–41)
Albumin: 3.6 g/dL (ref 3.5–5.0)
Alkaline Phosphatase: 60 U/L (ref 38–126)
Anion gap: 9 (ref 5–15)
BUN: 6 mg/dL (ref 6–20)
CO2: 21 mmol/L — ABNORMAL LOW (ref 22–32)
Calcium: 8.8 mg/dL — ABNORMAL LOW (ref 8.9–10.3)
Chloride: 108 mmol/L (ref 98–111)
Creatinine, Ser: 0.81 mg/dL (ref 0.61–1.24)
GFR, Estimated: >60 mL/min (ref >60)
Glucose, Bld: 84 mg/dL (ref 70–99)
Potassium: 3.8 mmol/L (ref 3.5–5.1)
Sodium: 138 mmol/L (ref 135–145)
Total Bilirubin: 1 mg/dL (ref 0.3–1.2)
Total Protein: 6.7 g/dL (ref 6.5–8.1)

## 2023-05-28 LAB — CBC
HCT: 40.3 % (ref 39.0–52.0)
Hemoglobin: 13.3 g/dL (ref 13.0–17.0)
MCH: 29.1 pg (ref 26.0–34.0)
MCHC: 33 g/dL (ref 30.0–36.0)
MCV: 88.2 fL (ref 80.0–100.0)
Platelets: 246 10*3/uL (ref 150–400)
RBC: 4.57 MIL/uL (ref 4.22–5.81)
RDW: 13.2 % (ref 11.5–15.5)
WBC: 3.8 10*3/uL — ABNORMAL LOW (ref 4.0–10.5)
nRBC: 0 % (ref 0.0–0.2)

## 2023-05-28 NOTE — TOC Transition Note (Signed)
Transition of Care [facility]) - CM/SW Discharge Note   Patient Details  Name: Hilman Catapano MRN: [id number] Date of Birth: 1970/12/22  [facility]) CM/SW Contact:  Lavenia Atlas, RN Phone Number: 05/28/2023, 10:34 AM   Clinical Narrative:    Per chart review, patient being discharged, no TOC needs.    Final next level of care: Home/Self Care Barriers to Discharge: No Barriers Identified   Patient Goals and CMS Choice CMS Medicare.gov Compare Post Acute Care list provided to:: Patient Choice offered to / list presented to : Patient  Discharge Placement                         Discharge Plan and Services Additional resources added to the After Visit Summary for                  DME Arranged: N/A DME Agency: NA       HH Arranged: NA HH Agency: NA        Social Determinants of Health (SDOH) Interventions SDOH Screenings   Food Insecurity: No Food Insecurity (05/26/2023)  Housing: Low Risk  (05/26/2023)  Transportation Needs: No Transportation Needs (05/26/2023)  Utilities: Not At Risk (05/26/2023)  Tobacco Use: Medium Risk (05/26/2023)     Readmission Risk Interventions    05/26/2023   12:38 PM 04/12/2023   10:41 AM 09/08/2022   12:46 PM  Readmission Risk Prevention Plan  Transportation Screening Complete Complete Complete  PCP or Specialist Appt within 3-5 Days   Complete  HRI or Home Care Consult   Complete  Social Work Consult for Recovery Care Planning/Counseling   Complete  Palliative Care Screening   Not Applicable  Medication Review Oceanographer) Complete Complete Complete  PCP or Specialist appointment within 3-5 days of discharge Complete Complete   HRI or Home Care Consult Complete Complete   SW Recovery Care/Counseling Consult Complete Complete   Palliative Care Screening Not Applicable Not Applicable   Skilled Nursing Facility Not Applicable Not Applicable

## 2023-05-28 NOTE — Discharge Summary (Signed)
Physician Discharge Summary  Max Ayers ZOX:[id number] DOB: [birth year] DOA: 05/25/2023  PCP: Patient, No Pcp Per  Admit date: 05/25/2023 Discharge date: 05/28/2023  Admitted From: Home Disposition:  Home   Recommendations for Outpatient Follow-up:  Follow up with PCP in 1-2 weeks Please obtain BMP/CBC in one week   Home Health:No Equipment/Devices:None  Discharge Condition:Stable CODE STATUS:Full Diet recommendation: Heart Healthy   Brief/Interim Summary:  52 y.o. male past medical history significant for alcohol abuse, recurrent pancreatitis and pseudocyst comes into the emergency room for abdominal pain nausea and vomiting, he relates his last alcoholic beverage was more than a year ago lipase of 1300 normal LFTs ultrasound redemonstrate known pancreatic pseudocyst    Discharge Diagnoses:  Principal Problem:   Acute pancreatitis Active Problems:   Benign essential HTN   Pancreatic pseudocyst   Hypokalemia  Acute pancreatitis in the setting of pseudocyst and alcohol use. He was placed n.p.o. started on IV fluids and analgesics. He improved he was started on a diet which she tolerated he has been counseled about abstinence.  Hypokalemia: Replete orally now resolved.  Essential hypertension: No changes made to his medication.  Normocytic anemia: No signs of overt bleeding follow-up with PCP as an outpatient has never had a colonoscopy.  Discharge Instructions  Discharge Instructions     Diet - low sodium heart healthy   Complete by: As directed    Increase activity slowly   Complete by: As directed       Allergies as of 05/28/2023       Reactions   Yellow Jacket Venom [bee Venom] Anaphylaxis, Hives   Gadolinium Derivatives Nausea Only   2nd time in a row. Pt received MRI contrast and immediately experienced nausea.         Medication List     STOP taking these medications    docusate sodium 100 MG capsule Commonly known as: COLACE    EPINEPHrine 0.3 mg/0.3 mL Soaj injection Commonly known as: EPI-PEN   ondansetron 4 MG disintegrating tablet Commonly known as: ZOFRAN-ODT   ondansetron 4 MG tablet Commonly known as: ZOFRAN   ondansetron 8 MG disintegrating tablet Commonly known as: ZOFRAN-ODT       TAKE these medications    amLODipine 10 MG tablet Commonly known as: NORVASC Take 1 tablet (10 mg total) by mouth daily.   hydrALAZINE 100 MG tablet Commonly known as: APRESOLINE Take 1 tablet (100 mg total) by mouth 3 (three) times daily. What changed: when to take this   naproxen 375 MG tablet Commonly known as: NAPROSYN Take 1 tablet (375 mg total) by mouth 2 (two) times daily. What changed:  when to take this reasons to take this   oxyCODONE 5 MG immediate release tablet Commonly known as: Roxicodone Take 1 tablet (5 mg total) by mouth every 4 (four) hours as needed for severe pain.   oxyCODONE-acetaminophen 5-325 MG tablet Commonly known as: PERCOCET/ROXICET Take 1 tablet by mouth every 6 (six) hours as needed for severe pain. What changed:  how much to take when to take this reasons to take this   pantoprazole 40 MG tablet Commonly known as: PROTONIX Take 1 tablet (40 mg total) by mouth 2 (two) times daily as needed (heartburn). What changed: when to take this   polyethylene glycol 17 g packet Commonly known as: MIRALAX / GLYCOLAX Take 17 g by mouth 2 (two) times daily.        Allergies  Allergen Reactions   Yellow Jacket Venom [Bee Venom]  Anaphylaxis and Hives   Gadolinium Derivatives Nausea Only    2nd time in a row. Pt received MRI contrast and immediately experienced nausea.     Consultations: None   Procedures/Studies: US Abdomen Limited  Result Date: 05/25/2023 CLINICAL DATA:  Epigastric/right upper quadrant pain. EXAM: ULTRASOUND ABDOMEN LIMITED RIGHT UPPER QUADRANT COMPARISON:  Multiple priors, most recent CT and ultrasound 05/02/2023. MRCP 01/03/2023 FINDINGS:  Gallbladder: Physiologically distended. No gallstones or wall thickening visualized. No sonographic Murphy sign noted by sonographer. Common bile duct: Diameter: 7 mm in the midportion, flaring distally at 10 mm. Liver: No intrahepatic biliary ductal dilatation. No focal lesion identified. Within normal limits in parenchymal echogenicity. Portal vein is patent on color Doppler imaging with normal direction of blood flow towards the liver. Other: Known cystic lesion in the pancreatic head, previously characterized as pseudocyst. No right upper quadrant ascites. IMPRESSION: 1. Normal sonographic appearance of the gallbladder. 2. Mild flaring of the distal common bile duct. 3. Known pancreatic pseudocyst. Electronically Signed   By: Narda Rutherford M.D.   On: 05/25/2023 23:50   CT ABDOMEN PELVIS W CONTRAST  Result Date: 05/02/2023 CLINICAL DATA:  Abdomen pain EXAM: CT ABDOMEN AND PELVIS WITH CONTRAST TECHNIQUE: Multidetector CT imaging of the abdomen and pelvis was performed using the standard protocol following bolus administration of intravenous contrast. RADIATION DOSE REDUCTION: This exam was performed according to the departmental dose-optimization program which includes automated exposure control, adjustment of the mA and/or kV according to patient size and/or use of iterative reconstruction technique. CONTRAST:  OMNIPAQUE IOHEXOL 300 MG/ML  SOLN COMPARISON:  04/11/2023, 03/08/2023, MRI 01/03/2023, CT 08/16/2020 FINDINGS: Lower chest: Lung bases demonstrate no acute airspace disease. Mild cardiomegaly. Hepatobiliary: No focal liver abnormality is seen. No gallstones, gallbladder wall thickening, or biliary dilatation. Pancreas: Mild diffuse ductal dilatation as before. Minimal fat stranding at the uncinate process of the pancreas. Cystic lesion at the uncinate process measuring 1.8 by 1.2 cm, stable since 2021. Spleen: Normal in size without focal abnormality. Adrenals/Urinary Tract: Adrenal glands are  normal. Kidneys show no hydronephrosis. The bladder is unremarkable Stomach/Bowel: The stomach is nonenlarged. No dilated small bowel. No acute bowel wall thickening. Vascular/Lymphatic: Moderate aortic atherosclerosis. No aneurysm. No suspicious lymph nodes Reproductive: Prostate is unremarkable. Other: Negative for pelvic effusion or free air. Musculoskeletal: No acute or suspicious osseous abnormality IMPRESSION: 1. Minimal fat stranding at the uncinate process of the pancreas, possible mild pancreatitis. 2. Stable 1.8 cm cystic lesion at the uncinate process of the pancreas. Follow-up as per MRI 01/03/2023. 3. Cardiomegaly. 4. Aortic atherosclerosis. Aortic Atherosclerosis (ICD10-I70.0). Electronically Signed   By: Jasmine Pang M.D.   On: 05/02/2023 21:59   US Abdomen Limited RUQ (LIVER/GB)  Result Date: 05/02/2023 CLINICAL DATA:  Right upper quadrant pain EXAM: ULTRASOUND ABDOMEN LIMITED RIGHT UPPER QUADRANT COMPARISON:  CT abdomen and pelvis 04/11/2023 and ultrasound 03/09/2023 FINDINGS: Gallbladder: No gallstones or wall thickening visualized. No sonographic Murphy sign noted by sonographer. Common bile duct: Diameter: 7 mm.  No intrahepatic biliary dilation. Liver: No focal lesion identified. Within normal limits in parenchymal echogenicity. Portal vein is patent on color Doppler imaging with normal direction of blood flow towards the liver. Other: None. IMPRESSION: No sonographic correlate for right upper quadrant pain. Electronically Signed   By: Minerva Fester M.D.   On: 05/02/2023 20:04   DG Chest 2 View  Result Date: 05/02/2023 CLINICAL DATA:  Right-sided lower chest pain. EXAM: CHEST - 2 VIEW COMPARISON:  February 12, 2023 FINDINGS: The  heart size and mediastinal contours are within normal limits. Both lungs are clear. The visualized skeletal structures are unremarkable. IMPRESSION: No active cardiopulmonary disease. Electronically Signed   By: Aram Candela M.D.   On: 05/02/2023 18:33    (Echo, Carotid, EGD, Colonoscopy, ERCP)    Subjective: No compalins  Discharge Exam: Vitals:   05/27/23 2059 05/28/23 0518  BP: (!) 143/81 (!) 150/87  Pulse: 86 88  Resp: 17 16  Temp: 98.2 F (36.8 C) 98.1 F (36.7 C)  SpO2: 98% 99%   Vitals:   05/27/23 1252 05/27/23 1629 05/27/23 2059 05/28/23 0518  BP: (!) 162/86 (!) 151/78 (!) 143/81 (!) 150/87  Pulse: 70 73 86 88  Resp: 14 18 17 16   Temp: 98.7 F (37.1 C) 98.2 F (36.8 C) 98.2 F (36.8 C) 98.1 F (36.7 C)  TempSrc: Oral Oral Oral Oral  SpO2: 96% 97% 98% 99%  Weight:    86.1 kg  Height:        General: Pt is alert, awake, not in acute distress Cardiovascular: RRR, S1/S2 +, no rubs, no gallops Respiratory: CTA bilaterally, no wheezing, no rhonchi Abdominal: Soft, NT, ND, bowel sounds + Extremities: no edema, no cyanosis    The results of significant diagnostics from this hospitalization (including imaging, microbiology, ancillary and laboratory) are listed below for reference.     Microbiology: No results found for this or any previous visit (from the past 240 hour(s)).   Labs: BNP (last 3 results) No results for input(s): "BNP" in the last 8760 hours. Basic Metabolic Panel: Recent Labs  Lab 05/25/23 2259 05/26/23 0637 05/27/23 0433 05/28/23 0631  NA 136 139 139 138  K 3.0* 4.0 3.6 3.8  CL 105 107 108 108  CO2 25 26 23  21*  GLUCOSE 121* 96 100* 84  BUN 13 14 6 6   CREATININE 1.00 0.94 0.82 0.81  CALCIUM 8.9 8.4* 9.0 8.8*  MG  --  2.2  --   --    Liver Function Tests: Recent Labs  Lab 05/25/23 2259 05/26/23 0637 05/27/23 0433 05/28/23 0631  AST 19 15 15  14*  ALT 15 14 13 13   ALKPHOS 64 55 54 60  BILITOT 0.6 0.5 0.5 1.0  PROT 7.6 6.3* 7.1 6.7  ALBUMIN 4.0 3.4* 3.7 3.6   Recent Labs  Lab 05/25/23 2259  LIPASE 1,314*   No results for input(s): "AMMONIA" in the last 168 hours. CBC: Recent Labs  Lab 05/25/23 2259 05/26/23 0637 05/27/23 0433 05/28/23 0631  WBC 4.7 4.3 4.9 3.8*   HGB 13.8 12.5* 13.9 13.3  HCT 40.6 38.1* 40.8 40.3  MCV 85.7 88.8 86.8 88.2  PLT 281 235 258 246   Cardiac Enzymes: No results for input(s): "CKTOTAL", "CKMB", "CKMBINDEX", "TROPONINI" in the last 168 hours. BNP: Invalid input(s): "POCBNP" CBG: No results for input(s): "GLUCAP" in the last 168 hours. D-Dimer No results for input(s): "DDIMER" in the last 72 hours. Hgb A1c No results for input(s): "HGBA1C" in the last 72 hours. Lipid Profile Recent Labs    05/26/23 0637  TRIG 98   Thyroid function studies No results for input(s): "TSH", "T4TOTAL", "T3FREE", "THYROIDAB" in the last 72 hours.  Invalid input(s): "FREET3" Anemia work up No results for input(s): "VITAMINB12", "FOLATE", "FERRITIN", "TIBC", "IRON", "RETICCTPCT" in the last 72 hours. Urinalysis    Component Value Date/Time   COLORURINE YELLOW 05/25/2023 2259   APPEARANCEUR CLEAR 05/25/2023 2259   LABSPEC >=1.030 05/25/2023 2259   PHURINE 6.0 05/25/2023 2259  GLUCOSEU NEGATIVE 05/25/2023 2259   HGBUR NEGATIVE 05/25/2023 2259   BILIRUBINUR SMALL (A) 05/25/2023 2259   KETONESUR NEGATIVE 05/25/2023 2259   PROTEINUR 30 (A) 05/25/2023 2259   UROBILINOGEN 0.2 10/27/2011 0957   NITRITE NEGATIVE 05/25/2023 2259   LEUKOCYTESUR NEGATIVE 05/25/2023 2259   Sepsis Labs Recent Labs  Lab 05/25/23 2259 05/26/23 0637 05/27/23 0433 05/28/23 0631  WBC 4.7 4.3 4.9 3.8*   Microbiology No results found for this or any previous visit (from the past 240 hour(s)).   Time coordinating discharge: Over 30 minutes  SIGNED:   Marinda Elk, MD  [facility] 05/28/2023, 10:10 AM Pager   If 7PM-7AM, please contact night-coverage [email or web address] Password [email or web address]

## 2023-05-30 NOTE — Anesthesia Preprocedure Evaluation (Signed)
Anesthesia Evaluation  Patient identified by MRN, date of birth, ID band Patient awake    Reviewed: Allergy & Precautions, H&P , NPO status , Patient's Chart, lab work & pertinent test results  Airway Mallampati: II  TM Distance: >3 FB Neck ROM: Full    Dental no notable dental hx. (+) Teeth Intact, Dental Advisory Given   Pulmonary neg pulmonary ROS, former smoker   Pulmonary exam normal breath sounds clear to auscultation       Cardiovascular Exercise Tolerance: Good hypertension, Pt. on medications  Rhythm:Regular Rate:Normal     Neuro/Psych negative neurological ROS  negative psych ROS   GI/Hepatic negative GI ROS,,,(+)     substance abuse  alcohol use  Endo/Other  negative endocrine ROS  Pancreatitis Pancreatic cyst  Renal/GU negative Renal ROS  negative genitourinary   Musculoskeletal   Abdominal   Peds  Hematology negative hematology ROS (+)   Anesthesia Other Findings   Reproductive/Obstetrics negative OB ROS                             Anesthesia Physical Anesthesia Plan  ASA: 3  Anesthesia Plan: MAC   Post-op Pain Management: Minimal or no pain anticipated   Induction: Intravenous  PONV Risk Score and Plan: 1 and Propofol infusion  Airway Management Planned: Natural Airway and Simple Face Mask  Additional Equipment:   Intra-op Plan:   Post-operative Plan:   Informed Consent: I have reviewed the patients History and Physical, chart, labs and discussed the procedure including the risks, benefits and alternatives for the proposed anesthesia with the patient or authorized representative who has indicated his/her understanding and acceptance.     Dental advisory given  Plan Discussed with: CRNA  Anesthesia Plan Comments:        Anesthesia Quick Evaluation

## 2023-05-31 ENCOUNTER — Other Ambulatory Visit: Payer: Self-pay

## 2023-05-31 ENCOUNTER — Encounter (HOSPITAL_COMMUNITY): Payer: Self-pay | Admitting: Gastroenterology

## 2023-05-31 ENCOUNTER — Ambulatory Visit (HOSPITAL_BASED_OUTPATIENT_CLINIC_OR_DEPARTMENT_OTHER): Payer: Medicaid Other | Admitting: Anesthesiology

## 2023-05-31 ENCOUNTER — Ambulatory Visit (HOSPITAL_COMMUNITY): Payer: Medicaid Other | Admitting: Anesthesiology

## 2023-05-31 ENCOUNTER — Ambulatory Visit (HOSPITAL_COMMUNITY)
Admission: RE | Admit: 2023-05-31 | Discharge: 2023-05-31 | Disposition: A | Discharge status: Home / Self Care | Payer: Medicaid Other | Attending: Gastroenterology | Admitting: Gastroenterology

## 2023-05-31 ENCOUNTER — Encounter (HOSPITAL_COMMUNITY)
Admission: RE | Disposition: A | Discharge status: Home / Self Care | Payer: Self-pay | Source: Home / Self Care | Attending: Gastroenterology

## 2023-05-31 DIAGNOSIS — K862 Cyst of pancreas: Secondary | ICD-10-CM

## 2023-05-31 DIAGNOSIS — E876 Hypokalemia: Secondary | ICD-10-CM | POA: Insufficient documentation

## 2023-05-31 DIAGNOSIS — Z87891 Personal history of nicotine dependence: Secondary | ICD-10-CM

## 2023-05-31 DIAGNOSIS — R748 Abnormal levels of other serum enzymes: Secondary | ICD-10-CM | POA: Insufficient documentation

## 2023-05-31 DIAGNOSIS — K859 Acute pancreatitis without necrosis or infection, unspecified: Secondary | ICD-10-CM | POA: Diagnosis not present

## 2023-05-31 DIAGNOSIS — I10 Essential (primary) hypertension: Secondary | ICD-10-CM

## 2023-05-31 HISTORY — PX: UPPER ESOPHAGEAL ENDOSCOPIC ULTRASOUND (EUS): SHX6562

## 2023-05-31 HISTORY — PX: ESOPHAGOGASTRODUODENOSCOPY (EGD) WITH PROPOFOL: SHX5813

## 2023-05-31 SURGERY — UPPER ESOPHAGEAL ENDOSCOPIC ULTRASOUND (EUS)
Anesthesia: Monitor Anesthesia Care

## 2023-05-31 MED ORDER — PROPOFOL 500 MG/50ML IV EMUL
INTRAVENOUS | Status: DC | PRN
  Administered 2023-05-31: 125 ug/kg/min via INTRAVENOUS

## 2023-05-31 MED ORDER — SODIUM CHLORIDE 0.9 % IV SOLN: INTRAVENOUS | Status: DC

## 2023-05-31 MED ORDER — LACTATED RINGERS IV SOLN: INTRAVENOUS | Status: DC | PRN

## 2023-05-31 MED ORDER — LIDOCAINE HCL (CARDIAC) PF 100 MG/5ML IV SOSY
PREFILLED_SYRINGE | INTRAVENOUS | Status: DC | PRN
  Administered 2023-05-31: 80 mg via INTRATRACHEAL

## 2023-05-31 MED ORDER — DEXMEDETOMIDINE HCL IN NACL 80 MCG/20ML IV SOLN
INTRAVENOUS | Status: DC | PRN
  Administered 2023-05-31: 8 ug via INTRAVENOUS

## 2023-05-31 NOTE — Anesthesia Postprocedure Evaluation (Signed)
Anesthesia Post Note  Patient: Max Ayers  Procedure(s) Performed: UPPER ESOPHAGEAL ENDOSCOPIC ULTRASOUND (EUS) (Bilateral) ESOPHAGOGASTRODUODENOSCOPY (EGD) WITH PROPOFOL     Patient location during evaluation: Endoscopy Anesthesia Type: MAC Level of consciousness: awake and alert Pain management: pain level controlled Vital Signs Assessment: post-procedure vital signs reviewed and stable Respiratory status: spontaneous breathing, nonlabored ventilation and respiratory function stable Cardiovascular status: stable and blood pressure returned to baseline Postop Assessment: no apparent nausea or vomiting Anesthetic complications: no  No notable events documented.  Last Vitals:  Vitals:   05/31/23 0925 05/31/23 0940  BP: 124/85 137/84  Pulse: 84 63  Resp: (!) 23 15  Temp: 36.7 C   SpO2: 96% 98%    Last Pain:  Vitals:   05/31/23 0950  TempSrc:   PainSc: 0-No pain                 Donja Tipping,W. EDMOND

## 2023-05-31 NOTE — Discharge Instructions (Signed)

## 2023-05-31 NOTE — Op Note (Signed)
[facility] Patient Name: Max Ayers Procedure Date: 05/31/2023 MRN: [id number] Attending MD: Willis Modena , MD, [id number] Date of Birth: 1971-02-08 CSN: [id number] Age: 52 Admit Type: Outpatient Procedure:                Upper EUS Indications:              Elevated lipase, Acute recurrent pancreatitis Providers:                Willis Modena, MD, Fransisca Connors, Beryle Beams, Technician ,[facility] CRNA Referring MD:              Medicines:                Monitored Anesthesia Care Complications:            No immediate complications. Estimated Blood Loss:     Estimated blood loss: none. Procedure:                Pre-Anesthesia Assessment:                           - Prior to the procedure, a History and Physical                            was performed, and patient medications and                            allergies were reviewed. The patient's tolerance of                            previous anesthesia was also reviewed. The risks                            and benefits of the procedure and the sedation                            options and risks were discussed with the patient.                            All questions were answered, and informed consent                            was obtained. Prior Anticoagulants: The patient has                            taken no anticoagulant or antiplatelet agents. ASA                            Grade Assessment: II - A patient with mild systemic                            disease. After reviewing the risks and benefits,  the patient was deemed in satisfactory condition to                            undergo the procedure.                           After obtaining informed consent, the endoscope was                            passed under direct vision. Throughout the                            procedure, the patient's blood pressure, pulse, and                             oxygen saturations were monitored continuously. The                            [id number] ([id number]) Olympus linear ultrasound scope                            was introduced through the mouth, and advanced to                            the second part of duodenum. The upper EUS was                            accomplished without difficulty. The patient                            tolerated the procedure well. Scope In: Scope Out: Findings:      ENDOSONOGRAPHIC FINDING: :      There was no sign of significant endosonographic abnormality in the       ampulla.      There was no sign of significant endosonographic abnormality in the left       lobe of the liver. Homogeneous parenchyma was identified.      There was no sign of significant endosonographic abnormality in the       common bile duct, in the common hepatic duct and in the gallbladder. The       maximum diameter of the ducts were 5 mm. No stones and no biliary sludge       were identified.      No lymphadenopathy seen.      Pancreatic parenchymal abnormalities were noted in the entire pancreas.       These consisted of hyperechoic strands, hyperechoic foci and lobularity.      A hypoechoic and shadowing with debris lesion suggestive of a cyst was       identified in the uncinate process of the pancreas. It is not in obvious       communication with the pancreatic duct. The lesion is approximately 20       mm x 20 mm in maximal cross-sectional diameter. There was a single       compartment without septae. The outer wall of the lesion was not seen.       There was no  associated mass. There was internal debris within the       fluid-filled cavity. Impression:               - There was no sign of significant pathology in the                            ampulla.                           - There was no evidence of significant pathology in                            the left lobe of the liver.                           -  There was no sign of significant pathology in the                            common bile duct, in the common hepatic duct and in                            the gallbladder.                           - Pancreatic parenchymal abnormalities consisting                            of hyperechoic strands, hyperechoic foci and                            lobularity were noted in the entire pancreas.                           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 This has been seen since at least                            2021 and is minimally changed in size/character.                            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 Patient had                            recent pancreatitis a few days ago (well after this                            EUS procedure was schedueld; FNA of this area seems                            risk-prohibitive).                           -  No specimens collected. Moderate Sedation:      None Recommendation:           - Discharge patient to home (via wheelchair).                           - Low fat diet today.                           - Continue present medications.                           - Refer to a tertiary center gastroenterologist                            with speciality expertise in recurrent pancreatitis                            at appointment to be scheduled.                           - Return to GI clinic (date not yet determined). Procedure Code(s):        --- Professional ---                           [phone], Esophagogastroduodenoscopy, flexible,                            transoral; with endoscopic ultrasound examination,                            including the esophagus, stomach, and either the                            duodenum or a surgically altered stomach where the                            jejunum is examined distal to the anastomosis Diagnosis Code(s):        --- Professional  ---                           K86.9, Disease of pancreas, unspecified                           K86.2, Cyst of pancreas                           R74.8, Abnormal levels of other serum enzymes                           K85.90, Acute pancreatitis without necrosis or                            infection, unspecified CPT copyright 2022 [employer]. All rights reserved. The codes documented in this report are preliminary and upon coder review may  be revised to meet current compliance requirements. Willis Modena, MD  05/31/2023 9:34:58 AM This report has been signed electronically. Number of Addenda: 0

## 2023-05-31 NOTE — Interval H&P Note (Signed)
History and Physical Interval Note:  05/31/2023 8:33 AM  Max Ayers  has presented today for surgery, with the diagnosis of pancreatits, pancreatic cyst.  The various methods of treatment have been discussed with the patient and family. After consideration of risks, benefits and other options for treatment, the patient has consented to  Procedure(s): UPPER ESOPHAGEAL ENDOSCOPIC ULTRASOUND (EUS) (Bilateral) FINE NEEDLE ASPIRATION (FNA) LINEAR (Bilateral) as a surgical intervention.  The patient's history has been reviewed, patient examined, no change in status, stable for surgery.  I have reviewed the patient's chart and labs.  Questions were answered to the patient's satisfaction.     Freddy Jaksch

## 2023-05-31 NOTE — Transfer of Care (Signed)
Immediate Anesthesia Transfer of Care Note  Patient: Max Ayers  Procedure(s) Performed: UPPER ESOPHAGEAL ENDOSCOPIC ULTRASOUND (EUS) (Bilateral) ESOPHAGOGASTRODUODENOSCOPY (EGD) WITH PROPOFOL  Patient Location: PACU  Anesthesia Type:MAC  Level of Consciousness: awake and alert   Airway & Oxygen Therapy: Patient Spontanous Breathing and Patient connected to nasal cannula oxygen  Post-op Assessment: Report given to RN and Post -op Vital signs reviewed and stable  Post vital signs: Reviewed and stable  Last Vitals:  Vitals Value Taken Time  BP 140/123 05/31/23 0923  Temp    Pulse 90 05/31/23 0924  Resp 32 05/31/23 0924  SpO2 100 % 05/31/23 0924  Vitals shown include unvalidated device data.  Last Pain:  Vitals:   05/31/23 0730  TempSrc: Temporal  PainSc: 0-No pain         Complications: No notable events documented.

## 2023-06-03 ENCOUNTER — Other Ambulatory Visit: Payer: Self-pay

## 2023-06-03 ENCOUNTER — Emergency Department (HOSPITAL_BASED_OUTPATIENT_CLINIC_OR_DEPARTMENT_OTHER)
Admission: EM | Admit: 2023-06-03 | Discharge: 2023-06-03 | Disposition: A | Discharge status: Home / Self Care | Payer: Medicaid Other | Attending: Emergency Medicine | Admitting: Emergency Medicine

## 2023-06-03 ENCOUNTER — Encounter (HOSPITAL_BASED_OUTPATIENT_CLINIC_OR_DEPARTMENT_OTHER): Payer: Self-pay | Admitting: Emergency Medicine

## 2023-06-03 DIAGNOSIS — K861 Other chronic pancreatitis: Secondary | ICD-10-CM | POA: Insufficient documentation

## 2023-06-03 DIAGNOSIS — Z79899 Other long term (current) drug therapy: Secondary | ICD-10-CM | POA: Diagnosis not present

## 2023-06-03 DIAGNOSIS — K859 Acute pancreatitis without necrosis or infection, unspecified: Secondary | ICD-10-CM | POA: Diagnosis not present

## 2023-06-03 DIAGNOSIS — I10 Essential (primary) hypertension: Secondary | ICD-10-CM | POA: Diagnosis not present

## 2023-06-03 DIAGNOSIS — R1013 Epigastric pain: Secondary | ICD-10-CM | POA: Diagnosis present

## 2023-06-03 LAB — CBC WITH DIFFERENTIAL/PLATELET
Abs Immature Granulocytes: 0 10*3/uL (ref 0.00–0.07)
Basophils Absolute: 0 10*3/uL (ref 0.0–0.1)
Basophils Relative: 0 %
Eosinophils Absolute: 0.1 10*3/uL (ref 0.0–0.5)
Eosinophils Relative: 2 %
HCT: 38.8 % — ABNORMAL LOW (ref 39.0–52.0)
Hemoglobin: 13 g/dL (ref 13.0–17.0)
Immature Granulocytes: 0 %
Lymphocytes Relative: 43 %
Lymphs Abs: 1.7 10*3/uL (ref 0.7–4.0)
MCH: 29.1 pg (ref 26.0–34.0)
MCHC: 33.5 g/dL (ref 30.0–36.0)
MCV: 86.8 fL (ref 80.0–100.0)
Monocytes Absolute: 0.4 10*3/uL (ref 0.1–1.0)
Monocytes Relative: 10 %
Neutro Abs: 1.7 10*3/uL (ref 1.7–7.7)
Neutrophils Relative %: 45 %
Platelets: 227 10*3/uL (ref 150–400)
RBC: 4.47 MIL/uL (ref 4.22–5.81)
RDW: 13 % (ref 11.5–15.5)
WBC: 3.9 10*3/uL — ABNORMAL LOW (ref 4.0–10.5)
nRBC: 0 % (ref 0.0–0.2)

## 2023-06-03 LAB — COMPREHENSIVE METABOLIC PANEL
ALT: 18 U/L (ref 0–44)
AST: 17 U/L (ref 15–41)
Albumin: 3.6 g/dL (ref 3.5–5.0)
Alkaline Phosphatase: 58 U/L (ref 38–126)
Anion gap: 7 (ref 5–15)
BUN: 14 mg/dL (ref 6–20)
CO2: 23 mmol/L (ref 22–32)
Calcium: 8.6 mg/dL — ABNORMAL LOW (ref 8.9–10.3)
Chloride: 109 mmol/L (ref 98–111)
Creatinine, Ser: 0.85 mg/dL (ref 0.61–1.24)
GFR, Estimated: >60 mL/min (ref >60)
Glucose, Bld: 103 mg/dL — ABNORMAL HIGH (ref 70–99)
Potassium: 3.5 mmol/L (ref 3.5–5.1)
Sodium: 139 mmol/L (ref 135–145)
Total Bilirubin: 0.4 mg/dL (ref 0.3–1.2)
Total Protein: 6.9 g/dL (ref 6.5–8.1)

## 2023-06-03 LAB — LIPASE, BLOOD: Lipase: 287 U/L — ABNORMAL HIGH (ref 11–51)

## 2023-06-03 MED ORDER — OXYCODONE-ACETAMINOPHEN 10-325 MG PO TABS: 1.0000 | ORAL_TABLET | Freq: Four times a day (QID) | ORAL | 0 refills | Status: DC | PRN

## 2023-06-03 MED ORDER — SODIUM CHLORIDE 0.9 % IV BOLUS
1000.0000 mL | Freq: Once | INTRAVENOUS | Status: AC
  Administered 2023-06-03: 1000 mL via INTRAVENOUS

## 2023-06-03 MED ORDER — ONDANSETRON HCL 4 MG/2ML IJ SOLN
4.0000 mg | Freq: Once | INTRAMUSCULAR | Status: AC
  Administered 2023-06-03: 4 mg via INTRAVENOUS
  Filled 2023-06-03: qty 2

## 2023-06-03 MED ORDER — HYDROMORPHONE HCL 1 MG/ML IJ SOLN
1.0000 mg | Freq: Once | INTRAMUSCULAR | Status: AC
  Administered 2023-06-03: 1 mg via INTRAVENOUS
  Filled 2023-06-03: qty 1

## 2023-06-03 NOTE — ED Provider Notes (Signed)
[facility] Provider Note: Lowella Dell, MD, FACEP  CSN: [id number] MRN: [id number] ARRIVAL: 06/03/23 at 0433 ROOM: MH01/MH01   CHIEF COMPLAINT  Abdominal Pain   HISTORY OF PRESENT ILLNESS  06/03/23 4:43 AM Max Ayers is a 52 y.o. male with recurrent pancreatitis recently admitted 05/25/2023 for acute pancreatitis and discharged 05/28/2023.  He is here with epigastric and right upper quadrant pain, consistent with recurrent pancreatitis, that began yesterday afternoon about 4 PM but acutely worsened this morning about 2 AM.  He vomited 1 time.  He rates his pain as a 7 out of 10, worse with movement or palpation.   Past Medical History:  Diagnosis Date   Acute alcoholic pancreatitis 12/14/2021   Hypertension    Tobacco use 12/12/2020    Past Surgical History:  Procedure Laterality Date   NO PAST SURGERIES      Family History  Problem Relation Age of Onset   Diabetes Mother    Hypertension Father    Breast cancer Maternal Grandmother    Colon cancer Neg Hx    Stomach cancer Neg Hx    Liver disease Neg Hx    Pancreatic cancer Neg Hx    Esophageal cancer Neg Hx    Rectal cancer Neg Hx     Social History   Tobacco Use   Smoking status: Former    Packs/day: 1    Types: Cigarettes, Cigars   Smokeless tobacco: Never  Vaping Use   Vaping Use: Every day   Substances: Flavoring  Substance Use Topics   Alcohol use: Not Currently    Comment: occ   Drug use: No    Prior to Admission medications   Medication Sig Start Date End Date Taking? Authorizing Provider  oxyCODONE-acetaminophen (PERCOCET) 10-325 MG tablet Take 1 tablet by mouth every 6 (six) hours as needed for pain. 06/03/23  Yes Mazzy Santarelli, MD  amLODipine (NORVASC) 10 MG tablet Take 1 tablet (10 mg total) by mouth daily. 04/15/23   Azucena Fallen, MD  hydrALAZINE (APRESOLINE) 100 MG tablet Take 1 tablet (100 mg total) by mouth 3 (three) times daily. Patient taking differently: Take 100 mg  by mouth 2 (two) times daily. 04/15/23   Azucena Fallen, MD  pantoprazole (PROTONIX) 40 MG tablet Take 1 tablet (40 mg total) by mouth 2 (two) times daily as needed (heartburn). Patient taking differently: Take 40 mg by mouth 2 (two) times daily. 04/15/23 07/14/23  Azucena Fallen, MD  polyethylene glycol (MIRALAX / GLYCOLAX) 17 g packet Take 17 g by mouth 2 (two) times daily. 04/15/23   Azucena Fallen, MD    Allergies Yellow jacket venom [bee venom] and Gadolinium derivatives   REVIEW OF SYSTEMS  Negative except as noted here or in the History of Present Illness.   PHYSICAL EXAMINATION  Initial Vital Signs Blood pressure (!) 165/90, pulse 67, temperature 98 F (36.7 C), temperature source Oral, resp. rate 18, height 5\' 11"  (1.803 m), weight 86 kg, SpO2 97 %.  Examination General: Well-developed, well-nourished male in no acute distress; appearance consistent with age of record HENT: normocephalic; atraumatic Eyes: Normal appearance Neck: supple Heart: regular rate and rhythm Lungs: clear to auscultation bilaterally Abdomen: soft; nondistended; epigastric tenderness; bowel sounds present Extremities: No deformity; full range of motion Neurologic: Awake, alert and oriented; motor function intact in all extremities and symmetric; no facial droop Skin: Warm and dry Psychiatric: Normal mood and affect   RESULTS  Summary of this visit's results, reviewed  and interpreted by myself:   EKG Interpretation  Date/Time:    Ventricular Rate:    PR Interval:    QRS Duration:   QT Interval:    QTC Calculation:   R Axis:     Text Interpretation:         Laboratory Studies: Results for orders placed or performed during the hospital encounter of 06/03/23 (from the past 24 hour(s))  CBC with Differential     Status: Abnormal   Collection Time: 06/03/23  5:07 AM  Result Value Ref Range   WBC 3.9 (L) 4.0 - 10.5 K/uL   RBC 4.47 4.22 - 5.81 MIL/uL   Hemoglobin 13.0 13.0 -  17.0 g/dL   HCT 02.7 (L) 25.3 - 66.4 %   MCV 86.8 80.0 - 100.0 fL   MCH 29.1 26.0 - 34.0 pg   MCHC 33.5 30.0 - 36.0 g/dL   RDW 40.3 47.4 - 25.9 %   Platelets 227 150 - 400 K/uL   nRBC 0.0 0.0 - 0.2 %   Neutrophils Relative % 45 %   Neutro Abs 1.7 1.7 - 7.7 K/uL   Lymphocytes Relative 43 %   Lymphs Abs 1.7 0.7 - 4.0 K/uL   Monocytes Relative 10 %   Monocytes Absolute 0.4 0.1 - 1.0 K/uL   Eosinophils Relative 2 %   Eosinophils Absolute 0.1 0.0 - 0.5 K/uL   Basophils Relative 0 %   Basophils Absolute 0.0 0.0 - 0.1 K/uL   Immature Granulocytes 0 %   Abs Immature Granulocytes 0.00 0.00 - 0.07 K/uL  Comprehensive metabolic panel     Status: Abnormal   Collection Time: 06/03/23  5:07 AM  Result Value Ref Range   Sodium 139 135 - 145 mmol/L   Potassium 3.5 3.5 - 5.1 mmol/L   Chloride 109 98 - 111 mmol/L   CO2 23 22 - 32 mmol/L   Glucose, Bld 103 (H) 70 - 99 mg/dL   BUN 14 6 - 20 mg/dL   Creatinine, Ser 5.63 0.61 - 1.24 mg/dL   Calcium 8.6 (L) 8.9 - 10.3 mg/dL   Total Protein 6.9 6.5 - 8.1 g/dL   Albumin 3.6 3.5 - 5.0 g/dL   AST 17 15 - 41 U/L   ALT 18 0 - 44 U/L   Alkaline Phosphatase 58 38 - 126 U/L   Total Bilirubin 0.4 0.3 - 1.2 mg/dL   GFR, Estimated >87 >56 mL/min   Anion gap 7 5 - 15  Lipase, blood     Status: Abnormal   Collection Time: 06/03/23  5:07 AM  Result Value Ref Range   Lipase 287 (H) 11 - 51 U/L   Imaging Studies: No results found.  ED COURSE and MDM  Nursing notes, initial and subsequent vitals signs, including pulse oximetry, reviewed and interpreted by myself.  Vitals:   06/03/23 0438 06/03/23 0439  BP: (!) 165/90   Pulse: 67   Resp: 18   Temp: 98 F (36.7 C)   TempSrc: Oral   SpO2: 97%   Weight:  86 kg  Height:  5\' 11"  (1.803 m)   Medications  sodium chloride 0.9 % bolus 1,000 mL (1,000 mLs Intravenous New Bag/Given 06/03/23 0504)  ondansetron (ZOFRAN) injection 4 mg (4 mg Intravenous Given 06/03/23 0504)  HYDROmorphone (DILAUDID) injection  1 mg (1 mg Intravenous Given 06/03/23 0505)  HYDROmorphone (DILAUDID) injection 1 mg (1 mg Intravenous Given 06/03/23 0549)   6:26 AM The patient is a previous is elevated but  not as elevated as some episodes in the past.  He is feeling better now after IV fluids and medications and is willing to trial home treatment.  He usually self treats at home when he has a flare.  His gastroenterologist, Dr. Dulce Sellar, is arranging for follow-up with a pancreatic subspecialist.   PROCEDURES  Procedures   ED DIAGNOSES     ICD-10-CM   1. Acute on chronic pancreatitis [facility])  K85.90    K86.1          Tahiri Shareef, Jonny Ruiz, MD 06/03/23 [phone]

## 2023-06-03 NOTE — ED Triage Notes (Signed)
RUQ pain since yesterday, has Hx of same and pancreatitis, has some nauesa with it a well.

## 2023-06-04 ENCOUNTER — Other Ambulatory Visit: Payer: Self-pay

## 2023-06-04 ENCOUNTER — Encounter (HOSPITAL_BASED_OUTPATIENT_CLINIC_OR_DEPARTMENT_OTHER): Payer: Self-pay

## 2023-06-04 ENCOUNTER — Emergency Department (HOSPITAL_BASED_OUTPATIENT_CLINIC_OR_DEPARTMENT_OTHER)
Admission: EM | Admit: 2023-06-04 | Discharge: 2023-06-04 | Disposition: A | Discharge status: Home / Self Care | Payer: Medicaid Other | Attending: Emergency Medicine | Admitting: Emergency Medicine

## 2023-06-04 DIAGNOSIS — F17200 Nicotine dependence, unspecified, uncomplicated: Secondary | ICD-10-CM | POA: Diagnosis not present

## 2023-06-04 DIAGNOSIS — K861 Other chronic pancreatitis: Secondary | ICD-10-CM | POA: Diagnosis not present

## 2023-06-04 DIAGNOSIS — I10 Essential (primary) hypertension: Secondary | ICD-10-CM | POA: Diagnosis not present

## 2023-06-04 DIAGNOSIS — Z79899 Other long term (current) drug therapy: Secondary | ICD-10-CM | POA: Insufficient documentation

## 2023-06-04 DIAGNOSIS — K859 Acute pancreatitis without necrosis or infection, unspecified: Secondary | ICD-10-CM | POA: Insufficient documentation

## 2023-06-04 LAB — LIPASE, BLOOD: Lipase: 194 U/L — ABNORMAL HIGH (ref 11–51)

## 2023-06-04 MED ORDER — METOCLOPRAMIDE HCL 10 MG PO TABS: 10.0000 mg | ORAL_TABLET | Freq: Four times a day (QID) | ORAL | 0 refills | Status: DC | PRN

## 2023-06-04 MED ORDER — SODIUM CHLORIDE 0.9 % IV BOLUS
1000.0000 mL | Freq: Once | INTRAVENOUS | Status: AC
  Administered 2023-06-04: 1000 mL via INTRAVENOUS

## 2023-06-04 MED ORDER — HYDROMORPHONE HCL 1 MG/ML IJ SOLN
1.0000 mg | Freq: Once | INTRAMUSCULAR | Status: AC
  Administered 2023-06-04: 1 mg via INTRAVENOUS
  Filled 2023-06-04: qty 1

## 2023-06-04 MED ORDER — METOCLOPRAMIDE HCL 5 MG/ML IJ SOLN
10.0000 mg | Freq: Once | INTRAMUSCULAR | Status: AC
  Administered 2023-06-04: 10 mg via INTRAVENOUS
  Filled 2023-06-04: qty 2

## 2023-06-04 NOTE — ED Triage Notes (Signed)
Pt reports n/v and upper abd pain. He was seen here yesterday for pancreatitis but states he is still vomiting. Emesis x 2 today after eating.

## 2023-06-04 NOTE — ED Provider Notes (Signed)
[facility] Provider Note: Lowella Dell, MD, FACEP  CSN: [id number] MRN: [id number] ARRIVAL: 06/04/23 at 0114 ROOM: MH04/MH04   CHIEF COMPLAINT  Abdominal Pain   HISTORY OF PRESENT ILLNESS  06/04/23 2:06 AM Max Ayers is a 52 y.o. male with recurrent acute on chronic pancreatitis.  He was seen by myself yesterday morning for an acute flare.  His lipase was noted to be 287 which is not as high as it usually is on presentation.  He was treated with IV fluids, Zofran and Dilaudid with improvement and he was discharged home with oxycodone.  He returns due to vomiting preventing him from being able to keep his pain under control.  He rates his pain as a 7 out of 10.   Past Medical History:  Diagnosis Date   Acute alcoholic pancreatitis 12/14/2021   Hypertension    Tobacco use 12/12/2020    Past Surgical History:  Procedure Laterality Date   NO PAST SURGERIES      Family History  Problem Relation Age of Onset   Diabetes Mother    Hypertension Father    Breast cancer Maternal Grandmother    Colon cancer Neg Hx    Stomach cancer Neg Hx    Liver disease Neg Hx    Pancreatic cancer Neg Hx    Esophageal cancer Neg Hx    Rectal cancer Neg Hx     Social History   Tobacco Use   Smoking status: Former    Packs/day: 1    Types: Cigarettes, Cigars   Smokeless tobacco: Never  Vaping Use   Vaping Use: Every day   Substances: Flavoring  Substance Use Topics   Alcohol use: Not Currently    Comment: occ   Drug use: No    Prior to Admission medications   Medication Sig Start Date End Date Taking? Authorizing Provider  metoCLOPramide (REGLAN) 10 MG tablet Take 1 tablet (10 mg total) by mouth every 6 (six) hours as needed for vomiting or nausea. 06/04/23  Yes Elenore Wanninger, MD  amLODipine (NORVASC) 10 MG tablet Take 1 tablet (10 mg total) by mouth daily. 04/15/23   Azucena Fallen, MD  hydrALAZINE (APRESOLINE) 100 MG tablet Take 1 tablet (100 mg total) by mouth  3 (three) times daily. Patient taking differently: Take 100 mg by mouth 2 (two) times daily. 04/15/23   Azucena Fallen, MD  oxyCODONE-acetaminophen (PERCOCET) 10-325 MG tablet Take 1 tablet by mouth every 6 (six) hours as needed for pain. 06/03/23   Caelyn Route, MD  pantoprazole (PROTONIX) 40 MG tablet Take 1 tablet (40 mg total) by mouth 2 (two) times daily as needed (heartburn). Patient taking differently: Take 40 mg by mouth 2 (two) times daily. 04/15/23 07/14/23  Azucena Fallen, MD  polyethylene glycol (MIRALAX / GLYCOLAX) 17 g packet Take 17 g by mouth 2 (two) times daily. 04/15/23   Azucena Fallen, MD    Allergies Yellow jacket venom [bee venom] and Gadolinium derivatives   REVIEW OF SYSTEMS  Negative except as noted here or in the History of Present Illness.   PHYSICAL EXAMINATION  Initial Vital Signs Blood pressure (!) 171/82, pulse 60, temperature 97.7 F (36.5 C), temperature source Oral, resp. rate 17, height 5\' 11"  (1.803 m), weight 85.7 kg, SpO2 97 %.  Examination General: Well-developed, well-nourished male in no acute distress; appearance consistent with age of record HENT: normocephalic; atraumatic Eyes: Normal appearance Neck: supple Heart: regular rate and rhythm Lungs: clear to auscultation  bilaterally Abdomen: soft; nondistended; epigastric tenderness; bowel sounds present Extremities: No deformity; full range of motion; pulses normal Neurologic: Awake, alert and oriented; motor function intact in all extremities and symmetric; no facial droop Skin: Warm and dry Psychiatric: Normal mood and affect   RESULTS  Summary of this visit's results, reviewed and interpreted by myself:   EKG Interpretation  Date/Time:    Ventricular Rate:    PR Interval:    QRS Duration:   QT Interval:    QTC Calculation:   R Axis:     Text Interpretation:         Laboratory Studies: Results for orders placed or performed during the hospital encounter of  06/04/23 (from the past 24 hour(s))  Lipase, blood     Status: Abnormal   Collection Time: 06/04/23  1:56 AM  Result Value Ref Range   Lipase 194 (H) 11 - 51 U/L   Results for orders placed or performed during the hospital encounter of 06/03/23 (from the past 24 hour(s))  CBC with Differential     Status: Abnormal   Collection Time: 06/03/23  5:07 AM  Result Value Ref Range   WBC 3.9 (L) 4.0 - 10.5 K/uL   RBC 4.47 4.22 - 5.81 MIL/uL   Hemoglobin 13.0 13.0 - 17.0 g/dL   HCT 16.1 (L) 09.6 - 04.5 %   MCV 86.8 80.0 - 100.0 fL   MCH 29.1 26.0 - 34.0 pg   MCHC 33.5 30.0 - 36.0 g/dL   RDW 40.9 81.1 - 91.4 %   Platelets 227 150 - 400 K/uL   nRBC 0.0 0.0 - 0.2 %   Neutrophils Relative % 45 %   Neutro Abs 1.7 1.7 - 7.7 K/uL   Lymphocytes Relative 43 %   Lymphs Abs 1.7 0.7 - 4.0 K/uL   Monocytes Relative 10 %   Monocytes Absolute 0.4 0.1 - 1.0 K/uL   Eosinophils Relative 2 %   Eosinophils Absolute 0.1 0.0 - 0.5 K/uL   Basophils Relative 0 %   Basophils Absolute 0.0 0.0 - 0.1 K/uL   Immature Granulocytes 0 %   Abs Immature Granulocytes 0.00 0.00 - 0.07 K/uL  Comprehensive metabolic panel     Status: Abnormal   Collection Time: 06/03/23  5:07 AM  Result Value Ref Range   Sodium 139 135 - 145 mmol/L   Potassium 3.5 3.5 - 5.1 mmol/L   Chloride 109 98 - 111 mmol/L   CO2 23 22 - 32 mmol/L   Glucose, Bld 103 (H) 70 - 99 mg/dL   BUN 14 6 - 20 mg/dL   Creatinine, Ser 7.82 0.61 - 1.24 mg/dL   Calcium 8.6 (L) 8.9 - 10.3 mg/dL   Total Protein 6.9 6.5 - 8.1 g/dL   Albumin 3.6 3.5 - 5.0 g/dL   AST 17 15 - 41 U/L   ALT 18 0 - 44 U/L   Alkaline Phosphatase 58 38 - 126 U/L   Total Bilirubin 0.4 0.3 - 1.2 mg/dL   GFR, Estimated >95 >62 mL/min   Anion gap 7 5 - 15  Lipase, blood     Status: Abnormal   Collection Time: 06/03/23  5:07 AM  Result Value Ref Range   Lipase 287 (H) 11 - 51 U/L   Imaging Studies: No results found.  ED COURSE and MDM  Nursing notes, initial and subsequent  vitals signs, including pulse oximetry, reviewed and interpreted by myself.  Vitals:   06/04/23 0128 06/04/23 0130 06/04/23 0200 06/04/23  0245  BP: (!) 171/82 (!) 161/98 137/74 131/71  Pulse: 60 (!) 59 (!) 52 (!) 51  Resp: 17     Temp: 97.7 F (36.5 C)     TempSrc: Oral     SpO2: 97% 97% 95% 96%  Weight:      Height:       Medications  sodium chloride 0.9 % bolus 1,000 mL (0 mLs Intravenous Stopped 06/04/23 0255)  metoCLOPramide (REGLAN) injection 10 mg (10 mg Intravenous Given 06/04/23 0152)  HYDROmorphone (DILAUDID) injection 1 mg (1 mg Intravenous Given 06/04/23 0152)   2:54 AM Pain and nausea adequately controlled with IV medications.  Lipase has improved since yesterday suggesting this pancreatitis flare is resolving.  Since patient did not get adequate relief with Zofran at home we will provide him with Reglan as well.   PROCEDURES  Procedures   ED DIAGNOSES     ICD-10-CM   1. Acute on chronic pancreatitis [facility])  K85.90    K86.1          Elantra Caprara, Jonny Ruiz, MD 06/04/23 [phone]

## 2023-06-05 ENCOUNTER — Other Ambulatory Visit (HOSPITAL_BASED_OUTPATIENT_CLINIC_OR_DEPARTMENT_OTHER): Payer: Self-pay

## 2023-06-05 ENCOUNTER — Emergency Department (HOSPITAL_BASED_OUTPATIENT_CLINIC_OR_DEPARTMENT_OTHER)
Admission: EM | Admit: 2023-06-05 | Discharge: 2023-06-05 | Disposition: A | Discharge status: Home / Self Care | Payer: Medicaid Other | Attending: Emergency Medicine | Admitting: Emergency Medicine

## 2023-06-05 ENCOUNTER — Other Ambulatory Visit: Payer: Self-pay

## 2023-06-05 ENCOUNTER — Encounter (HOSPITAL_COMMUNITY): Payer: Self-pay | Admitting: Gastroenterology

## 2023-06-05 DIAGNOSIS — E871 Hypo-osmolality and hyponatremia: Secondary | ICD-10-CM | POA: Insufficient documentation

## 2023-06-05 DIAGNOSIS — R1011 Right upper quadrant pain: Secondary | ICD-10-CM | POA: Insufficient documentation

## 2023-06-05 DIAGNOSIS — R1013 Epigastric pain: Secondary | ICD-10-CM | POA: Insufficient documentation

## 2023-06-05 DIAGNOSIS — K859 Acute pancreatitis without necrosis or infection, unspecified: Secondary | ICD-10-CM

## 2023-06-05 LAB — COMPREHENSIVE METABOLIC PANEL
ALT: 14 U/L (ref 0–44)
AST: 23 U/L (ref 15–41)
Albumin: 4 g/dL (ref 3.5–5.0)
Alkaline Phosphatase: 63 U/L (ref 38–126)
Anion gap: 4 — ABNORMAL LOW (ref 5–15)
BUN: 13 mg/dL (ref 6–20)
CO2: 23 mmol/L (ref 22–32)
Calcium: 8.7 mg/dL — ABNORMAL LOW (ref 8.9–10.3)
Chloride: 107 mmol/L (ref 98–111)
Creatinine, Ser: 0.99 mg/dL (ref 0.61–1.24)
GFR, Estimated: >60 mL/min (ref >60)
Glucose, Bld: 104 mg/dL — ABNORMAL HIGH (ref 70–99)
Potassium: 4.3 mmol/L (ref 3.5–5.1)
Sodium: 134 mmol/L — ABNORMAL LOW (ref 135–145)
Total Bilirubin: 1.2 mg/dL (ref 0.3–1.2)
Total Protein: 7.7 g/dL (ref 6.5–8.1)

## 2023-06-05 LAB — CBC
HCT: 39.4 % (ref 39.0–52.0)
Hemoglobin: 13.5 g/dL (ref 13.0–17.0)
MCH: 29.3 pg (ref 26.0–34.0)
MCHC: 34.3 g/dL (ref 30.0–36.0)
MCV: 85.7 fL (ref 80.0–100.0)
Platelets: 255 10*3/uL (ref 150–400)
RBC: 4.6 MIL/uL (ref 4.22–5.81)
RDW: 12.8 % (ref 11.5–15.5)
WBC: 4.2 10*3/uL (ref 4.0–10.5)
nRBC: 0 % (ref 0.0–0.2)

## 2023-06-05 LAB — URINALYSIS, ROUTINE W REFLEX MICROSCOPIC
Bilirubin Urine: NEGATIVE
Glucose, UA: NEGATIVE mg/dL
Hgb urine dipstick: NEGATIVE
Ketones, ur: NEGATIVE mg/dL
Leukocytes,Ua: NEGATIVE
Nitrite: NEGATIVE
Protein, ur: NEGATIVE mg/dL
Specific Gravity, Urine: >=1.03 (ref 1.005–1.030)
pH: 6 (ref 5.0–8.0)

## 2023-06-05 LAB — LIPASE, BLOOD: Lipase: 810 U/L — ABNORMAL HIGH (ref 11–51)

## 2023-06-05 MED ORDER — OXYCODONE-ACETAMINOPHEN 5-325 MG PO TABS: 2.0000 | ORAL_TABLET | ORAL | Status: DC

## 2023-06-05 MED ORDER — HYDROMORPHONE HCL 1 MG/ML IJ SOLN
0.5000 mg | Freq: Once | INTRAMUSCULAR | Status: AC
  Administered 2023-06-05: 0.5 mg via INTRAVENOUS
  Filled 2023-06-05: qty 1

## 2023-06-05 MED ORDER — OXYCODONE-ACETAMINOPHEN 10-325 MG PO TABS
1.0000 | ORAL_TABLET | Freq: Four times a day (QID) | ORAL | 0 refills | Status: DC | PRN
  Filled 2023-06-05: qty 12, 3d supply, fill #0

## 2023-06-05 MED ORDER — ONDANSETRON HCL 4 MG/2ML IJ SOLN
4.0000 mg | Freq: Once | INTRAMUSCULAR | Status: AC
  Administered 2023-06-05: 4 mg via INTRAVENOUS
  Filled 2023-06-05: qty 2

## 2023-06-05 MED ORDER — LACTATED RINGERS IV BOLUS
1000.0000 mL | Freq: Once | INTRAVENOUS | Status: AC
  Administered 2023-06-05: 1000 mL via INTRAVENOUS

## 2023-06-05 MED ORDER — HYDROMORPHONE HCL 1 MG/ML IJ SOLN
1.0000 mg | Freq: Once | INTRAMUSCULAR | Status: AC
  Administered 2023-06-05: 1 mg via INTRAVENOUS
  Filled 2023-06-05: qty 1

## 2023-06-05 MED ORDER — ACETAMINOPHEN 500 MG PO TABS
1000.0000 mg | ORAL_TABLET | ORAL | Status: AC
  Administered 2023-06-05: 1000 mg via ORAL
  Filled 2023-06-05: qty 2

## 2023-06-05 NOTE — ED Triage Notes (Signed)
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

## 2023-06-05 NOTE — Discharge Instructions (Signed)
You were seen for your pancreatitis in the emergency department.   At home, please get a clear liquid diet for the next 24 hours and advance your diet as tolerated.  Use the Percocet you were prescribed for your pain.    Check your MyChart online for the results of any tests that had not resulted by the time you left the emergency department.   Follow-up with your primary doctor in 2-3 days regarding your visit.    Return immediately to the emergency department if you experience any of the following: Worsening pain, vomiting despite the medications, or any other concerning symptoms.    Thank you for visiting our Emergency Department. It was a pleasure taking care of you today.

## 2023-06-05 NOTE — ED Provider Notes (Signed)
  Physical Exam  BP (!) 158/80   Pulse 76   Temp 97.6 F (36.4 C)   Resp 16   Ht 5\' 11"  (1.803 m)   Wt 86.2 kg   SpO2 97%   BMI 26.50 kg/m   Physical Exam Vitals and nursing note reviewed.  Constitutional:      General: He is not in acute distress.    Appearance: He is well-developed.  HENT:     Head: Normocephalic and atraumatic.     Right Ear: External ear normal.     Left Ear: External ear normal.     Nose: Nose normal.  Eyes:     Extraocular Movements: Extraocular movements intact.     Conjunctiva/sclera: Conjunctivae normal.     Pupils: Pupils are equal, round, and reactive to light.  Cardiovascular:     Rate and Rhythm: Normal rate and regular rhythm.  Pulmonary:     Effort: Pulmonary effort is normal. No respiratory distress.  Abdominal:     General: There is no distension.     Palpations: Abdomen is soft. There is no mass.     Tenderness: There is abdominal tenderness (Epigastrium and right upper quadrant). There is no guarding.  Musculoskeletal:     Cervical back: Normal range of motion and neck supple.  Skin:    General: Skin is warm and dry.  Neurological:     Mental Status: He is alert. Mental status is at baseline.  Psychiatric:        Mood and Affect: Mood normal.        Behavior: Behavior normal.     Procedures  Procedures  ED Course / MDM   Clinical Course as of 06/05/23 2350  Mon Jun 05, 2023  1616 Assumed primary care of the patient.  52 year old male with history of alcohol abuse and chronic pancreatitis who came to the emergency department for the third time this week with acute on chronic pancreatitis.  Says that this feels similar to prior pancreatitis flares.  Denies any alcohol use in the past year.  Says that since leaving yesterday he ate a sandwich and started experiencing 7/10 pain in his upper abdomen.  Today's lipase is elevated from priors.  No elevated LFTs to suggest choledocholithiasis or gallstone pancreatitis.  Patient is still  requesting to go home and says he was unable to pick up his pain medications we will call the pharmacy to figure out what is going on with that.  IV Dilaudid, Zofran, and fluids ordered for him at this time. [RP]  1635 There is an issue with patient's Percocet prescription that was sent to the pharmacy by Dr. Read Drivers several days ago.  Changed to our pharmacy and patient's wife will pick up his Percocet now. [RP]  2350 Late entry.  Patient was feeling much better after the Dilaudid and fluids and Zofran.  Is tolerating p.o.  Was counseled to go on a clear liquid diet and advance as tolerated.  Wife was able to pick up his Percocet.  Will have him follow-up with his primary doctor in several days. [RP]    Clinical Course User Index [RP] Rondel Baton, MD   Medical Decision Making Amount and/or Complexity of Data Reviewed Labs: ordered.  Risk OTC drugs. Prescription drug management.      Rondel Baton, MD 06/05/23 2350

## 2023-06-05 NOTE — ED Notes (Signed)
Pulled pt to fast-track 1 for blood work and IV placement, pt sent back to lobby, waiting for room

## 2023-06-05 NOTE — ED Provider Notes (Signed)
[place] [facility] Provider Note   CSN: [id number] Arrival date & time: 06/05/23  1402     History  Chief Complaint  Patient presents with   Abdominal Pain    Max Ayers is a 52 y.o. male with PMHx recurrent acute on chronic pancreatitis who presents to ED complaining of epigastric and RUQ. Patient seen in this ED three times this week for similar symptoms and was diagnosed with pancreatitis - tolerating PO intake and pain controlled so discharged with outpatient management. Patient returns to ED today because he states that pharmacy would not give him his pain prescription. Patient also stating that pain was resolving until he ate a bologna sandwich earlier today. Patient endorses being aware that he was supposed to be on clear liquid diet until pancreatitis resolves.   Denies fever, vomiting, diarrhea, chest pain, dyspnea.   Abdominal Pain      Home Medications Prior to Admission medications   Medication Sig Start Date End Date Taking? Authorizing Provider  amLODipine (NORVASC) 10 MG tablet Take 1 tablet (10 mg total) by mouth daily. 04/15/23   Azucena Fallen, MD  hydrALAZINE (APRESOLINE) 100 MG tablet Take 1 tablet (100 mg total) by mouth 3 (three) times daily. Patient taking differently: Take 100 mg by mouth 2 (two) times daily. 04/15/23   Azucena Fallen, MD  metoCLOPramide (REGLAN) 10 MG tablet Take 1 tablet (10 mg total) by mouth every 6 (six) hours as needed for vomiting or nausea. 06/04/23   Ayers, John, MD  oxyCODONE-acetaminophen (PERCOCET) 10-325 MG tablet Take 1 tablet by mouth every 6 (six) hours as needed for pain. 06/03/23   Ayers, John, MD  pantoprazole (PROTONIX) 40 MG tablet Take 1 tablet (40 mg total) by mouth 2 (two) times daily as needed (heartburn). Patient taking differently: Take 40 mg by mouth 2 (two) times daily. 04/15/23 07/14/23  Azucena Fallen, MD  polyethylene glycol (MIRALAX / GLYCOLAX) 17 g packet  Take 17 g by mouth 2 (two) times daily. 04/15/23   Azucena Fallen, MD      Allergies    Yellow jacket venom [bee venom] and Gadolinium derivatives    Review of Systems   Review of Systems  Gastrointestinal:  Positive for abdominal pain.    Physical Exam Updated Vital Signs BP 122/89 (BP Location: Left Arm)   Pulse 78   Temp 97.6 F (36.4 C) (Oral)   Resp 18   Ht 5\' 11"  (1.803 m)   Wt 86.2 kg   SpO2 100%   BMI 26.50 kg/m  Physical Exam Vitals and nursing note reviewed.  Constitutional:      General: He is not in acute distress.    Appearance: He is not ill-appearing or toxic-appearing.  HENT:     Head: Normocephalic and atraumatic.     Mouth/Throat:     Mouth: Mucous membranes are moist.     Pharynx: No oropharyngeal exudate or posterior oropharyngeal erythema.  Eyes:     General: No scleral icterus.       Right eye: No discharge.        Left eye: No discharge.     Conjunctiva/sclera: Conjunctivae normal.  Cardiovascular:     Rate and Rhythm: Normal rate and regular rhythm.     Pulses: Normal pulses.     Heart sounds: Normal heart sounds. No murmur heard. Pulmonary:     Effort: Pulmonary effort is normal.  Abdominal:     General: Abdomen is  flat. Bowel sounds are normal. There is no distension.     Palpations: Abdomen is soft. There is no mass.     Tenderness: There is no abdominal tenderness.     Comments: Tenderness to palpation of upper abdominal quadrants. To masses or distention appreciated. No skin changes/bruising.  Musculoskeletal:     Right lower leg: No edema.     Left lower leg: No edema.  Skin:    General: Skin is warm and dry.     Findings: No rash.  Neurological:     General: No focal deficit present.     Mental Status: He is alert. Mental status is at baseline.  Psychiatric:        Mood and Affect: Mood normal.     ED Results / Procedures / Treatments   Labs (all labs ordered are listed, but only abnormal results are displayed) Labs  Reviewed  COMPREHENSIVE METABOLIC PANEL - Abnormal; Notable for the following components:      Result Value   Sodium 134 (*)    Glucose, Bld 104 (*)    Calcium 8.7 (*)    Anion gap 4 (*)    All other components within normal limits  CBC  URINALYSIS, ROUTINE W REFLEX MICROSCOPIC  LIPASE, BLOOD    EKG None  Radiology No results found.  Procedures Procedures    Medications Ordered in ED Medications - No data to display  ED Course/ Medical Decision Making/ A&P Clinical Course as of 06/05/23 1655  Mon Jun 05, 2023  1616 Assumed primary care of the patient.  51 year old male with history of alcohol abuse and chronic pancreatitis who came to the emergency department for the third time this week with acute on chronic pancreatitis.  Says that this feels similar to prior pancreatitis flares.  Denies any alcohol use in the past year.  Says that since leaving yesterday he ate a sandwich and started experiencing 7/10 pain in his upper abdomen.  Today's lipase is elevated from priors.  No elevated LFTs to suggest choledocholithiasis or gallstone pancreatitis.  Patient is still requesting to go home and says he was unable to pick up his pain medications we will call the pharmacy to figure out what is going on with that.  IV Dilaudid, Zofran, and fluids ordered for him at this time. [RP]  1635 There is an issue with patient's Percocet prescription that was sent to the pharmacy by Dr. Read Drivers several days ago.  Changed to our pharmacy and patient's wife will pick up his Percocet now. [RP]    Clinical Course User Index [RP] Rondel Baton, MD                             Medical Decision Making Amount and/or Complexity of Data Reviewed Labs: ordered.  Risk Prescription drug management.    This patient presents to the ED for concern of abdominal pain, this involves an extensive number of treatment options, and is a complaint that carries with it a high risk of complications and  morbidity.  The differential diagnosis includes gastroenteritis, colitis, small bowel obstruction, appendicitis, cholecystitis, pancreatitis, nephrolithiasis, UTI, pyleonephritis,   Co morbidities that complicate the patient evaluation  none   Lab Tests:  I Ordered, and personally interpreted labs.  The pertinent results include:   CBC with differential: No concern for anemia or leukocytosis CMP: mild hyponatremia (134) Lipase: 810 - which is elevated from yesterday's level UA: not concerning  for infection    Problem List / ED Course / Critical interventions / Medication management   Patient presenting to ED concerning for pancreatitis. Symptoms were resolving until patient ate bologna sandwich. Patient still able to tolerate PO - presenting to ED since the pharmacy would not give him pain medicine prescribed to him. Lipase are elevated today - which I believe is due to patient not adhering to clear liquid diet.  4:30PM Care of Max Ayers transferred to Dr. Eloise Harman at the end of my shift as the patient will require reassessment once labs/imaging have resulted. Patient presentation, ED course, and plan of care discussed with review of all pertinent labs and imaging. Please see his/her note for further details regarding further ED course and disposition. This may be altered or completely changed at the discretion of the oncoming team pending results of further workup.          Final Clinical Impression(s) / ED Diagnoses Final diagnoses:  None    Rx / DC Orders ED Discharge Orders     None         Margarita Rana 06/05/23 1710    Gwyneth Sprout, MD 06/09/23 1744

## 2023-07-13 ENCOUNTER — Other Ambulatory Visit: Payer: Self-pay

## 2023-07-13 ENCOUNTER — Emergency Department (HOSPITAL_BASED_OUTPATIENT_CLINIC_OR_DEPARTMENT_OTHER)
Admission: EM | Admit: 2023-07-13 | Discharge: 2023-07-13 | Disposition: A | Discharge status: Home / Self Care | Payer: Medicaid Other | Attending: Emergency Medicine | Admitting: Emergency Medicine

## 2023-07-13 ENCOUNTER — Encounter (HOSPITAL_BASED_OUTPATIENT_CLINIC_OR_DEPARTMENT_OTHER): Payer: Self-pay | Admitting: Emergency Medicine

## 2023-07-13 ENCOUNTER — Emergency Department (HOSPITAL_BASED_OUTPATIENT_CLINIC_OR_DEPARTMENT_OTHER): Payer: Medicaid Other

## 2023-07-13 ENCOUNTER — Other Ambulatory Visit (HOSPITAL_BASED_OUTPATIENT_CLINIC_OR_DEPARTMENT_OTHER): Payer: Self-pay

## 2023-07-13 DIAGNOSIS — R1012 Left upper quadrant pain: Secondary | ICD-10-CM | POA: Diagnosis present

## 2023-07-13 DIAGNOSIS — K859 Acute pancreatitis without necrosis or infection, unspecified: Secondary | ICD-10-CM | POA: Insufficient documentation

## 2023-07-13 LAB — COMPREHENSIVE METABOLIC PANEL
ALT: 12 U/L (ref 0–44)
AST: 17 U/L (ref 15–41)
Albumin: 3.7 g/dL (ref 3.5–5.0)
Alkaline Phosphatase: 69 U/L (ref 38–126)
Anion gap: 5 (ref 5–15)
BUN: 12 mg/dL (ref 6–20)
CO2: 29 mmol/L (ref 22–32)
Calcium: 9 mg/dL (ref 8.9–10.3)
Chloride: 107 mmol/L (ref 98–111)
Creatinine, Ser: 1.02 mg/dL (ref 0.61–1.24)
GFR, Estimated: >60 mL/min (ref >60)
Glucose, Bld: 109 mg/dL — ABNORMAL HIGH (ref 70–99)
Potassium: 4.1 mmol/L (ref 3.5–5.1)
Sodium: 141 mmol/L (ref 135–145)
Total Bilirubin: 0.6 mg/dL (ref 0.3–1.2)
Total Protein: 7.1 g/dL (ref 6.5–8.1)

## 2023-07-13 LAB — URINALYSIS, ROUTINE W REFLEX MICROSCOPIC
Bilirubin Urine: NEGATIVE
Glucose, UA: NEGATIVE mg/dL
Hgb urine dipstick: NEGATIVE
Ketones, ur: NEGATIVE mg/dL
Leukocytes,Ua: NEGATIVE
Nitrite: NEGATIVE
Protein, ur: 30 mg/dL — AB
Specific Gravity, Urine: >=1.03 (ref 1.005–1.030)
pH: 5.5 (ref 5.0–8.0)

## 2023-07-13 LAB — URINALYSIS, MICROSCOPIC (REFLEX)

## 2023-07-13 LAB — CBC
HCT: 40.3 % (ref 39.0–52.0)
Hemoglobin: 13.3 g/dL (ref 13.0–17.0)
MCH: 28.9 pg (ref 26.0–34.0)
MCHC: 33 g/dL (ref 30.0–36.0)
MCV: 87.4 fL (ref 80.0–100.0)
Platelets: 225 10*3/uL (ref 150–400)
RBC: 4.61 MIL/uL (ref 4.22–5.81)
RDW: 13.2 % (ref 11.5–15.5)
WBC: 8.4 10*3/uL (ref 4.0–10.5)
nRBC: 0 % (ref 0.0–0.2)

## 2023-07-13 LAB — LIPASE, BLOOD: Lipase: 277 U/L — ABNORMAL HIGH (ref 11–51)

## 2023-07-13 MED ORDER — HYDROMORPHONE HCL 1 MG/ML IJ SOLN
1.0000 mg | Freq: Once | INTRAMUSCULAR | Status: AC
  Administered 2023-07-13: 1 mg via INTRAVENOUS
  Filled 2023-07-13: qty 1

## 2023-07-13 MED ORDER — OXYCODONE HCL 5 MG PO TABS
5.0000 mg | ORAL_TABLET | Freq: Four times a day (QID) | ORAL | 0 refills | Status: DC | PRN
  Filled 2023-07-13: qty 20, 5d supply, fill #0

## 2023-07-13 MED ORDER — SODIUM CHLORIDE 0.9 % IV BOLUS
1000.0000 mL | Freq: Once | INTRAVENOUS | Status: AC
  Administered 2023-07-13: 1000 mL via INTRAVENOUS

## 2023-07-13 MED ORDER — ONDANSETRON HCL 4 MG/2ML IJ SOLN
4.0000 mg | Freq: Once | INTRAMUSCULAR | Status: AC
  Administered 2023-07-13: 4 mg via INTRAVENOUS
  Filled 2023-07-13: qty 2

## 2023-07-13 MED ORDER — HYDROMORPHONE HCL 1 MG/ML IJ SOLN
1.0000 mg | Freq: Once | INTRAMUSCULAR | Status: AC
  Administered 2023-07-13: 1 mg via INTRAVENOUS
  Filled 2023-07-13 (×2): qty 1

## 2023-07-13 MED ORDER — OXYCODONE HCL 5 MG PO TABS
5.0000 mg | ORAL_TABLET | Freq: Once | ORAL | Status: AC
  Administered 2023-07-13: 5 mg via ORAL
  Filled 2023-07-13: qty 1

## 2023-07-13 NOTE — Discharge Instructions (Signed)
Contact a health care provider if: You do not get better as fast as expected. Your symptoms get worse or you get new symptoms. You keep having pain, weakness, or nausea. You get better and then pain comes back. You have a fever. Get help right away if: You vomit every time you eat or drink. Your pain becomes severe. Your skin or the white parts of your eyes turn yellow (jaundice). You have sudden swelling in your abdomen. You feel dizzy or you faint. Your blood sugar is high (over 300 mg/dL). You vomit blood. These symptoms may be an emergency. Get help right away. Call 911. Do not wait to see if the symptoms will go away. Do not drive yourself to the hospital.

## 2023-07-13 NOTE — ED Notes (Signed)
Urine specimen in lab 

## 2023-07-13 NOTE — ED Provider Notes (Signed)
[place] [facility] Provider Note   CSN: [id number] Arrival date & time: 07/13/23  9753     History  Chief Complaint  Patient presents with   Abdominal Pain    LUQ    Max Ayers is a 52 y.o. male who presents emergency department chief complaint of right upper quadrant and left upper quadrant abdominal pain.  Patient states that he has a past medical history of recurrent episodes of pancreatitis.  He states he had onset of severe pain in his right upper quadrant then now progressed across the left side starting 2 days ago after eating [employer] French fries.  He has associated nausea without vomiting.  He rates his pain at 10 out of 10.  It radiates to his right flank.  He has a gallbladder.   Abdominal Pain      Home Medications Prior to Admission medications   Medication Sig Start Date End Date Taking? Authorizing Provider  amLODipine (NORVASC) 10 MG tablet Take 1 tablet (10 mg total) by mouth daily. 04/15/23   Azucena Fallen, MD  hydrALAZINE (APRESOLINE) 100 MG tablet Take 1 tablet (100 mg total) by mouth 3 (three) times daily. Patient taking differently: Take 100 mg by mouth 2 (two) times daily. 04/15/23   Azucena Fallen, MD  metoCLOPramide (REGLAN) 10 MG tablet Take 1 tablet (10 mg total) by mouth every 6 (six) hours as needed for vomiting or nausea. 06/04/23   Molpus, John, MD  oxyCODONE-acetaminophen (PERCOCET) 10-325 MG tablet Take 1 tablet by mouth every 6 (six) hours as needed for pain. 06/05/23   Rondel Baton, MD  pantoprazole (PROTONIX) 40 MG tablet Take 1 tablet (40 mg total) by mouth 2 (two) times daily as needed (heartburn). Patient taking differently: Take 40 mg by mouth 2 (two) times daily. 04/15/23 07/14/23  Azucena Fallen, MD  polyethylene glycol (MIRALAX / GLYCOLAX) 17 g packet Take 17 g by mouth 2 (two) times daily. 04/15/23   Azucena Fallen, MD      Allergies    Yellow jacket venom [bee venom] and  Gadolinium derivatives    Review of Systems   Review of Systems  Gastrointestinal:  Positive for abdominal pain.    Physical Exam Updated Vital Signs BP 135/77 (BP Location: Left Arm)   Pulse 88   Temp 98.8 F (37.1 C) (Oral)   Resp 18   Ht 5\' 11"  (1.803 m)   Wt 88.5 kg   SpO2 99%   BMI 27.20 kg/m  Physical Exam Vitals and nursing note reviewed.  Constitutional:      General: He is not in acute distress.    Appearance: He is well-developed. He is not diaphoretic.  HENT:     Head: Normocephalic and atraumatic.  Eyes:     General: No scleral icterus.    Conjunctiva/sclera: Conjunctivae normal.  Cardiovascular:     Rate and Rhythm: Normal rate and regular rhythm.     Heart sounds: Normal heart sounds.  Pulmonary:     Effort: Pulmonary effort is normal. No respiratory distress.     Breath sounds: Normal breath sounds.  Abdominal:     Palpations: Abdomen is soft.     Tenderness: There is generalized abdominal tenderness. There is guarding.  Musculoskeletal:     Cervical back: Normal range of motion and neck supple.  Skin:    General: Skin is warm and dry.  Neurological:     Mental Status: He is alert.  Psychiatric:        Behavior: Behavior normal.     ED Results / Procedures / Treatments   Labs (all labs ordered are listed, but only abnormal results are displayed) Labs Reviewed  URINALYSIS, ROUTINE W REFLEX MICROSCOPIC - Abnormal; Notable for the following components:      Result Value   Protein, ur 30 (*)    All other components within normal limits  URINALYSIS, MICROSCOPIC (REFLEX) - Abnormal; Notable for the following components:   Bacteria, UA RARE (*)    All other components within normal limits  LIPASE, BLOOD  COMPREHENSIVE METABOLIC PANEL  CBC    EKG None  Radiology No results found.  Procedures Procedures    Medications Ordered in ED Medications  HYDROmorphone (DILAUDID) injection 1 mg (has no administration in time range)  ondansetron  (ZOFRAN) injection 4 mg (has no administration in time range)  sodium chloride 0.9 % bolus 1,000 mL (has no administration in time range)    ED Course/ Medical Decision Making/ A&P Clinical Course as of 07/13/23 1321  Thu Jul 13, 2023  1220 BP(!): 141/77 [AH]  1220 Comprehensive metabolic panel(!) [AH]  1220 Lipase, blood(!) Patient reevaluated.  His pain is significantly improving.  Plan to p.o. challenge and order oral pain medication. [AH]    Clinical Course User Index [AH] Arthor Captain, PA-C                                 Medical Decision Making Amount and/or Complexity of Data Reviewed Labs: ordered. Decision-making details documented in ED Course. Radiology: ordered.  Risk Prescription drug management.   This patient presents to the ED for concern of abd pain , this involves an extensive number of treatment options, and is a complaint that carries with it a high risk of complications and morbidity.  The differential diagnosis for generalized abdominal pain includes, but is not limited to AAA, gastroenteritis, appendicitis, Bowel obstruction, Bowel perforation. Gastroparesis, DKA, Hernia, Inflammatory bowel disease, mesenteric ischemia, pancreatitis, peritonitis SBP, volvulus.   Co morbidities:  history of recurrent pancreatitis  Social Determinants of Health:   SDOH Screenings   Food Insecurity: No Food Insecurity (05/26/2023)  Housing: Low Risk  (05/26/2023)  Transportation Needs: No Transportation Needs (05/26/2023)  Utilities: Not At Risk (05/26/2023)  Financial Resource Strain: Low Risk  (05/01/2023)   Received from [facility], [facility]  Physical Activity: Unknown (05/01/2023)   Received from [facility], [facility]  Social Connections: Socially Integrated (05/01/2023)   Received from [facility], [facility]  Stress: Stress Concern Present (05/01/2023)   Received from [facility], [facility]  Tobacco Use: Medium Risk (07/13/2023)      Additional history:  {Additional history obtained from review of EMR {External records from outside source obtained and reviewed including show history from [place]  Lab Tests:  I Ordered, and personally interpreted labs.  The pertinent results include:   I reviewed patient's labs and interpreted these.  Lipase 277 consistent with diagnosis of acute pancreatitis.  CMP shows mildly elevated blood glucose likely acute phase reaction.  Patient's labs are otherwise within normal limits.  Imaging Studies:  I ordered imaging studies including right upper quadrant ultrasound I independently visualized and interpreted imaging which showed acute finding I agree with the radiologist interpretation  Cardiac Monitoring/ECG:  The patient was maintained on a cardiac monitor.  I personally viewed and interpreted the cardiac monitored which showed an underlying  rhythm of: [place] rhythm  Medicines ordered and prescription drug management:  I ordered medication including  Medications  HYDROmorphone (DILAUDID) injection 1 mg (1 mg Intravenous Given 07/13/23 0936)  ondansetron (ZOFRAN) injection 4 mg (4 mg Intravenous Given 07/13/23 0936)  sodium chloride 0.9 % bolus 1,000 mL (0 mLs Intravenous Stopped 07/13/23 1045)  HYDROmorphone (DILAUDID) injection 1 mg (1 mg Intravenous Given 07/13/23 1054)  sodium chloride 0.9 % bolus 1,000 mL ( Intravenous Stopped 07/13/23 1156)  oxyCODONE (Oxy IR/ROXICODONE) immediate release tablet 5 mg (5 mg Oral Given 07/13/23 1220)   for pain and nausea Reevaluation of the patient after these medicines showed that the patient improved I have reviewed the patients home medicines and have made adjustments as needed  Test Considered:   CT imaging of the abdomen however symptoms are well-managed at this time I doubt fulminant pancreatitis  Critical Interventions:   fluids, pain medications and antiemetics  Consultations Obtained:   Problem List / ED Course:      ICD-10-CM   1. Acute pancreatitis without infection or necrosis, unspecified pancreatitis type  K85.90       MDM: Patient here with acute on chronic pancreatitis.  He has a known pseudocyst.  Patient's pain well-controlled, no elevated white count, serial exam shows improved tenderness in the abdomen.  He is tolerating p.o. fluids and pain medications.  Will discharge with oxycodone antiemetics and clear liquid diet.  PDMP not reviewed this encounter.  Discussed return precautions.  Patient has history of the same and is well aware of reasons to seek immediate medical care.  Appears otherwise appropriate for discharge at this time  Dispostion:  After consideration of the diagnostic results and the patients response to treatment, I feel that the patent would benefit from charge with close outpatient follow-up.         Final Clinical Impression(s) / ED Diagnoses Final diagnoses:  None    Rx / DC Orders ED Discharge Orders     None         Arthor Captain, PA-C 07/13/23 1325    Laurence Spates, MD 07/14/23 [phone]

## 2023-07-13 NOTE — ED Triage Notes (Signed)
LUQ pain , nausea no emesis , Hx pancreatitis.

## 2023-07-13 NOTE — ED Notes (Signed)
Discharge paperwork reviewed entirely with patient, including follow up care. Pain was under control. The patient received instruction and coaching on their prescriptions, and all follow-up questions were answered.  Pt verbalized understanding as well as all parties involved. No questions or concerns voiced at the time of discharge. No acute distress noted.   Pt ambulated out to PVA without incident or assistance.  

## 2023-07-20 IMAGING — MR MR ABDOMEN WO/W CM MRCP
21 of 23 series · 46 of 48 positions shown · IV contrast (10 ML GADAVIST)
Comparison: Multiple priors including most recent CT abdomen
December 13, 2021 and MRI abdomen April 09, 2021

CLINICAL DATA: Further evaluation of pancreatic lesion seen on
prior CT.

EXAM:
MRI ABDOMEN WITHOUT AND WITH CONTRAST (INCLUDING MRCP)
TECHNIQUE: Multiplanar multisequence MR imaging of the abdomen was performed
both before and after the administration of intravenous contrast.
Heavily T2-weighted images of the biliary and pancreatic ducts were
obtained, and three-dimensional MRCP images were rendered by post
processing.
CONTRAST:  10mL GADAVIST GADOBUTROL 1 MMOL/ML IV SOLN

[Series 2: DWI · axial · 6.0mm · 1.49mm/px · 1 of 72 slices shown (1 of 2)]
[im 1/72]
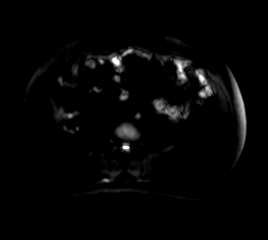

[Series 3: DWI · axial · 6.0mm · 1.49mm/px · 1 of 36 slices shown (2 of 2)]
[im 1/36]
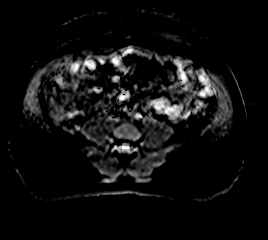

[Series 4: T2 fat-sat · axial · 6.0mm · 1.25mm/px · 1 of 36 slices shown]
[im 1/36]
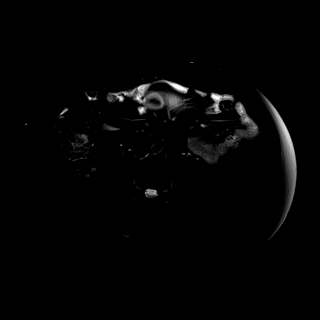

[Series 7: cor_3d_spc_trig · coronal · 1.0mm · 0.49mm/px · 1 of 72 slices shown]
[im 1/72]
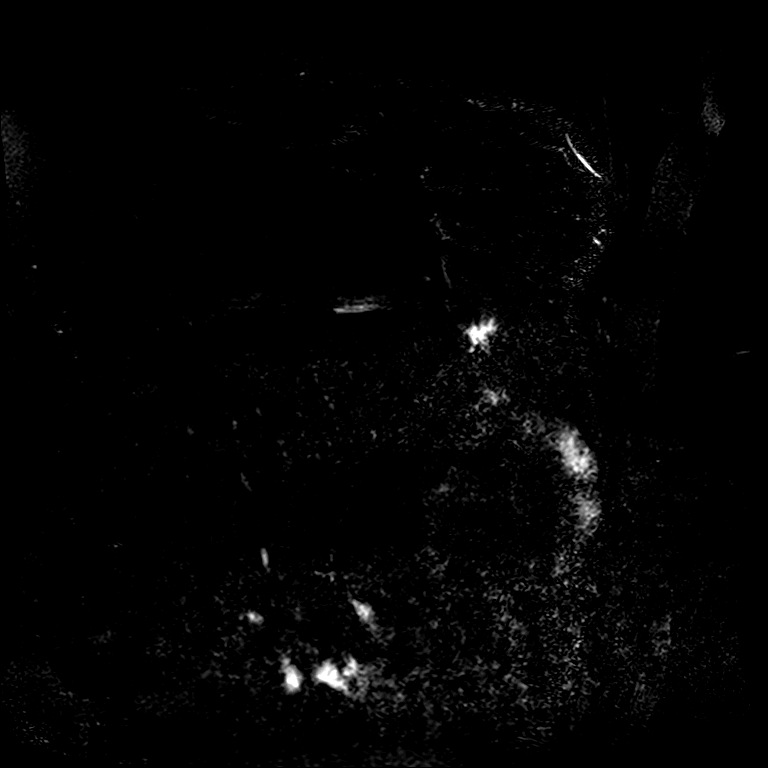

[Series 10: T2 · coronal · 6.0mm · 1.48mm/px · 1 of 40 slices shown (1 of 2)]
[im 1/40]
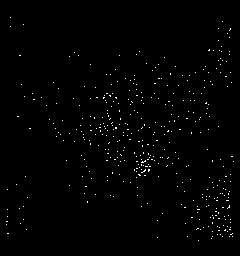

[Series 11: T1 · axial · 3.0mm · 1.25mm/px · z∈[-194,+43]mm · 2 of 80 slices shown (1 of 2)]
[im 1/80]
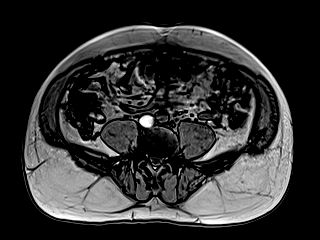
[im 80/80]
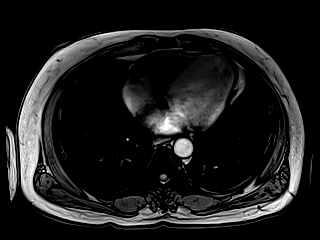

[Series 12: T1 · axial · 3.0mm · 1.25mm/px · z∈[-194,+43]mm · 2 of 80 slices shown (2 of 2)]
[im 1/80]
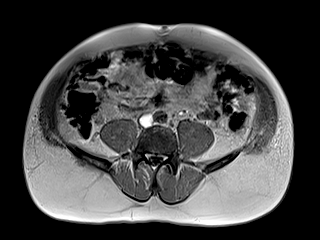
[im 80/80]
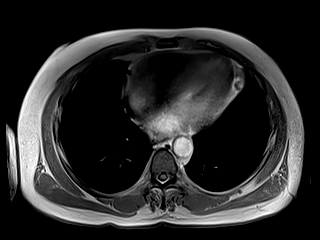

[Series 13: T2 · axial · 6.0mm · 1.56mm/px · 1 of 40 slices shown (2 of 2)]
[im 1/40]
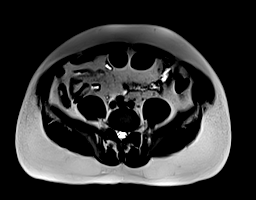

[Series 15: cor obl thk · sagittal · 50.0mm · 0.78mm/px · 1 of 9 slices shown]
[im 1/9]
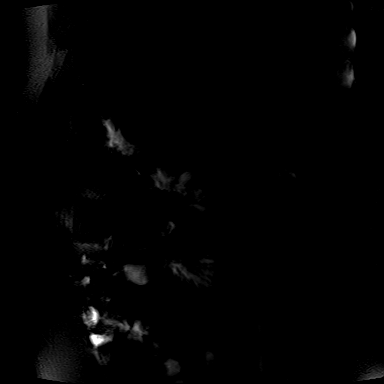

[Series 17: T1 dynamic · axial · 3.0mm · 1.25mm/px · z∈[-196,+41]mm · 3 of 80 slices shown (1 of 12)]
[im 1/80]
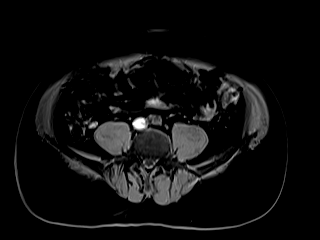
[im 40/80]
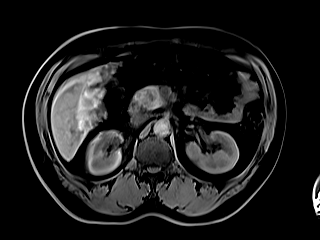
[im 80/80]
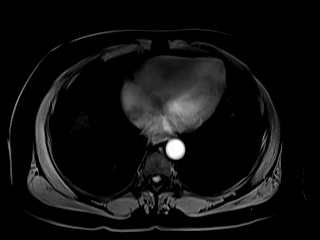

[Series 21: T1 dynamic · axial · 3.0mm · 1.25mm/px · z∈[-196,+41]mm · 3 of 80 slices shown (2 of 12)]
[im 1/80]
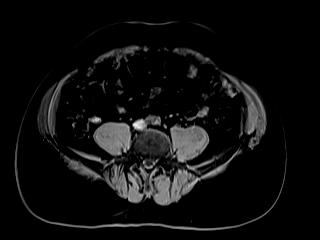
[im 40/80]
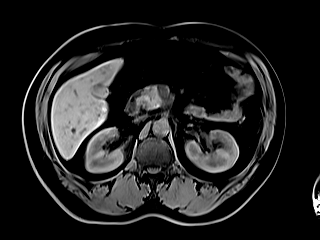
[im 80/80]
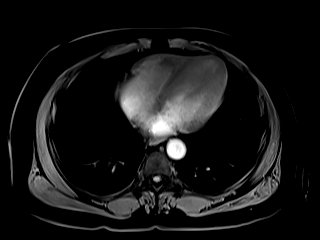

[Series 22: T1 dynamic · axial · 3.0mm · 1.25mm/px · z∈[-196,+41]mm · 3 of 80 slices shown (3 of 12)]
[im 1/80]
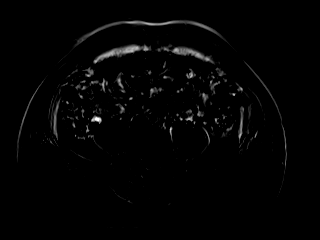
[im 40/80]
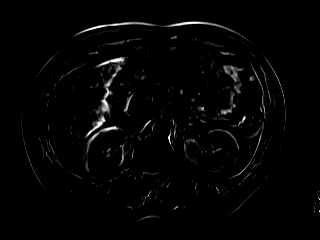
[im 80/80]
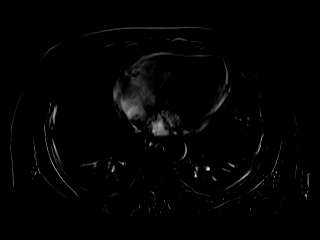

[Series 25: T1 dynamic · axial · 3.0mm · 1.25mm/px · z∈[-196,+41]mm · 3 of 80 slices shown (4 of 12)]
[im 1/80]
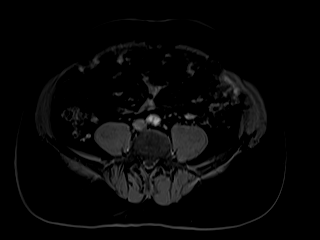
[im 40/80]
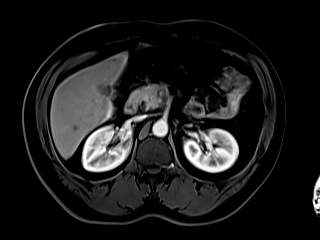
[im 80/80]
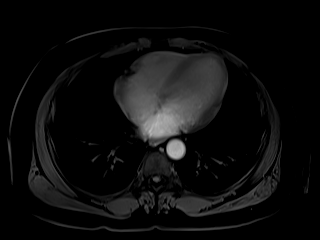

[Series 26: T1 dynamic · axial · 3.0mm · 1.25mm/px · z∈[-196,+41]mm · 3 of 80 slices shown (5 of 12)]
[im 1/80]
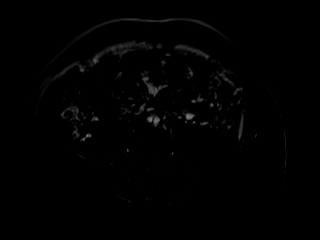
[im 40/80]
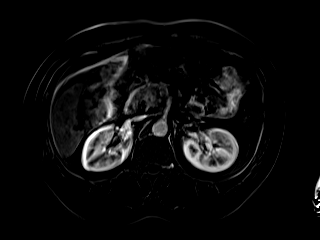
[im 80/80]
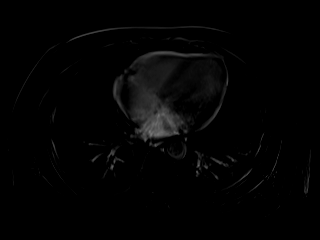

[Series 29: T1 dynamic · axial · 3.0mm · 1.25mm/px · z∈[-196,+41]mm · 3 of 80 slices shown (6 of 12)]
[im 1/80]
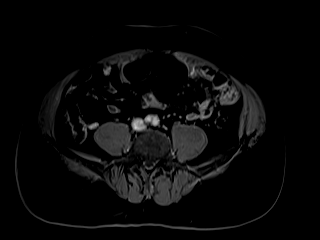
[im 40/80]
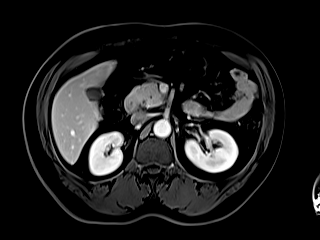
[im 80/80]
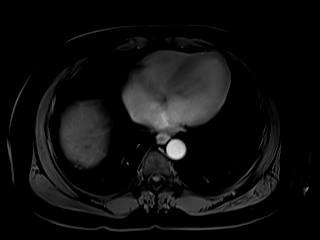

[Series 30: T1 dynamic · axial · 3.0mm · 1.25mm/px · z∈[-196,+41]mm · 3 of 80 slices shown (7 of 12)]
[im 1/80]
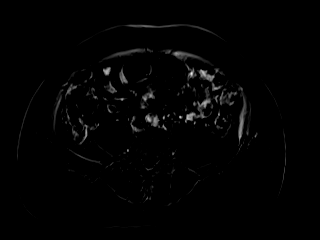
[im 40/80]
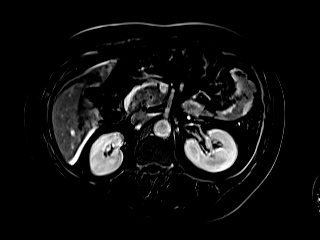
[im 80/80]
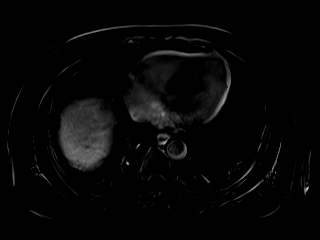

[Series 33: T1 dynamic · axial · 3.0mm · 1.25mm/px · z∈[-196,+41]mm · 3 of 80 slices shown (8 of 12)]
[im 1/80]
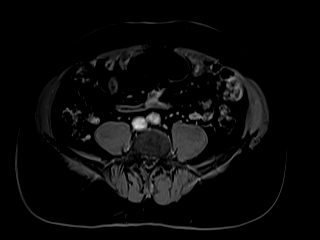
[im 40/80]
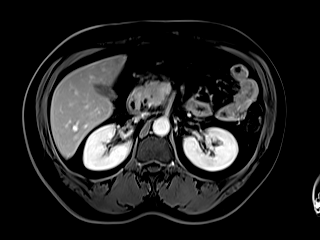
[im 80/80]
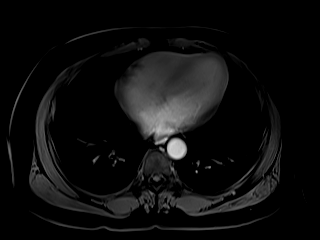

[Series 34: T1 dynamic · axial · 3.0mm · 1.25mm/px · z∈[-196,+41]mm · 3 of 80 slices shown (9 of 12)]
[im 1/80]
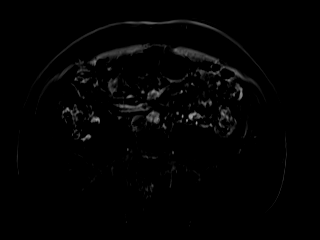
[im 40/80]
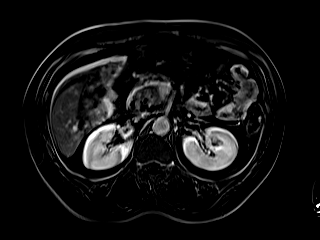
[im 80/80]
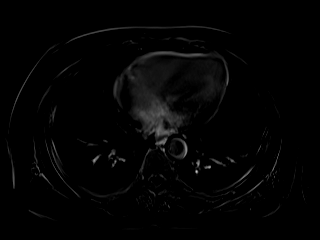

[Series 36: T1 dynamic · coronal · 4.0mm · 1.41mm/px · 2 of 64 slices shown (10 of 12)]
[im 1/64]
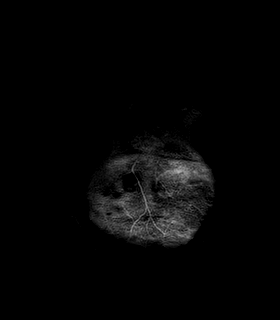
[im 64/64]
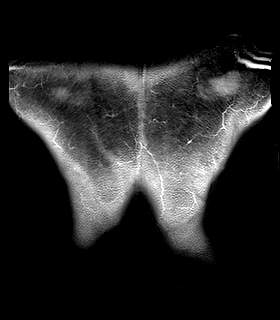

[Series 39: T1 dynamic · axial · 3.0mm · 1.25mm/px · z∈[-196,+41]mm · 3 of 80 slices shown (11 of 12)]
[im 1/80]
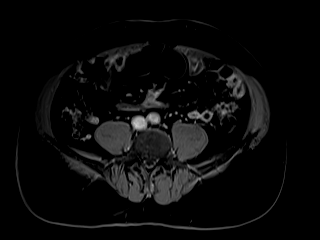
[im 40/80]
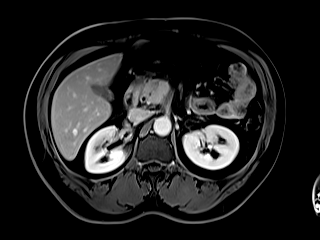
[im 80/80]
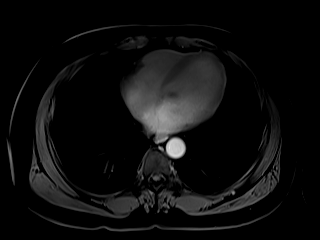

[Series 40: T1 dynamic · axial · 3.0mm · 1.25mm/px · z∈[-196,+41]mm · 3 of 80 slices shown (12 of 12)]
[im 1/80]
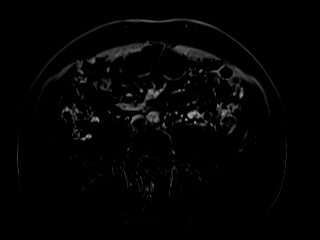
[im 40/80]
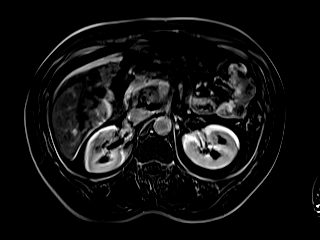
[im 80/80]
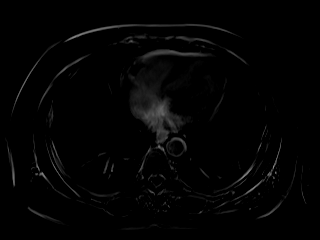

[46 of 48 positions shown; findings below may reference images not displayed]

FINDINGS: Lower chest: No acute abnormality.

Hepatobiliary: Mild diffuse hepatic steatosis. Gallbladder is
unremarkable. No biliary ductal dilation.

Pancreas: Nonenhancing lesion in the pancreatic head measures 15 x
11 x 11 mm on images [DATE] and [DATE], there is a crenulated
appearance of the outer lesion, with a T2 hypointense outer rim, T2
hyperintense interim and heterogeneous central area, with overall
the lesion appearing heterogeneously intrinsically T1 hypointense,
without postcontrast enhancement or reduced diffusivity. This lesion
appears to a been present dating back to at least August 16, 2020
although waxing and waning in size since that time. There is no
definite communication with this lesion in the pancreatic duct. No
pancreatic ductal dilation. No additional pancreatic lesions
identified. No evidence of acute pancreatic inflammation.

Spleen:  Within normal limits in size and appearance.

Adrenals/Urinary Tract: No masses identified. No evidence of
hydronephrosis.

Stomach/Bowel: Visualized portions within the abdomen are
unremarkable.

Vascular/Lymphatic: No pathologically enlarged lymph nodes
identified. No abdominal aortic aneurysm demonstrated.

Other:  None.

Musculoskeletal: No suspicious bone lesions identified.
IMPRESSION: 1. Complex 15 mm lesion in the inferior pancreatic head, which has
been waxing and waning in size dating back to August 16, 2020,
without pancreatic ductal communication or suspicious MRI features.
This lesion almost certainly reflects a complex pancreatic
pseudocyst/walled off pancreatic necrosis, however pancreatic
neoplasm is not completely excluded based on appearance. Consider
further evaluation with ERCP and tissue sampling versus follow-up
MRCP with and without contrast in 6-12 months to assess
stability/resolution.
2. Mild diffuse hepatic steatosis.

## 2023-09-07 ENCOUNTER — Other Ambulatory Visit: Payer: Self-pay

## 2023-09-07 ENCOUNTER — Encounter (HOSPITAL_BASED_OUTPATIENT_CLINIC_OR_DEPARTMENT_OTHER): Payer: Self-pay | Admitting: Emergency Medicine

## 2023-09-07 ENCOUNTER — Emergency Department (HOSPITAL_BASED_OUTPATIENT_CLINIC_OR_DEPARTMENT_OTHER)
Admission: EM | Admit: 2023-09-07 | Discharge: 2023-09-07 | Disposition: A | Discharge status: Home / Self Care | Payer: Medicaid Other | Attending: Emergency Medicine | Admitting: Emergency Medicine

## 2023-09-07 ENCOUNTER — Other Ambulatory Visit (HOSPITAL_BASED_OUTPATIENT_CLINIC_OR_DEPARTMENT_OTHER): Payer: Self-pay

## 2023-09-07 DIAGNOSIS — I10 Essential (primary) hypertension: Secondary | ICD-10-CM | POA: Diagnosis not present

## 2023-09-07 DIAGNOSIS — R109 Unspecified abdominal pain: Secondary | ICD-10-CM | POA: Diagnosis present

## 2023-09-07 DIAGNOSIS — Z87891 Personal history of nicotine dependence: Secondary | ICD-10-CM | POA: Insufficient documentation

## 2023-09-07 DIAGNOSIS — K859 Acute pancreatitis without necrosis or infection, unspecified: Secondary | ICD-10-CM | POA: Diagnosis not present

## 2023-09-07 LAB — CBC
HCT: 40.6 % (ref 39.0–52.0)
Hemoglobin: 13.7 g/dL (ref 13.0–17.0)
MCH: 28.8 pg (ref 26.0–34.0)
MCHC: 33.7 g/dL (ref 30.0–36.0)
MCV: 85.5 fL (ref 80.0–100.0)
Platelets: 215 10*3/uL (ref 150–400)
RBC: 4.75 MIL/uL (ref 4.22–5.81)
RDW: 12.9 % (ref 11.5–15.5)
WBC: 4.2 10*3/uL (ref 4.0–10.5)
nRBC: 0 % (ref 0.0–0.2)

## 2023-09-07 LAB — COMPREHENSIVE METABOLIC PANEL
ALT: 13 U/L (ref 0–44)
AST: 17 U/L (ref 15–41)
Albumin: 3.7 g/dL (ref 3.5–5.0)
Alkaline Phosphatase: 64 U/L (ref 38–126)
Anion gap: 10 (ref 5–15)
BUN: 10 mg/dL (ref 6–20)
CO2: 24 mmol/L (ref 22–32)
Calcium: 9.2 mg/dL (ref 8.9–10.3)
Chloride: 105 mmol/L (ref 98–111)
Creatinine, Ser: 0.93 mg/dL (ref 0.61–1.24)
GFR, Estimated: >60 mL/min (ref >60)
Glucose, Bld: 104 mg/dL — ABNORMAL HIGH (ref 70–99)
Potassium: 3.6 mmol/L (ref 3.5–5.1)
Sodium: 139 mmol/L (ref 135–145)
Total Bilirubin: 0.9 mg/dL (ref 0.3–1.2)
Total Protein: 7.5 g/dL (ref 6.5–8.1)

## 2023-09-07 LAB — LIPASE, BLOOD: Lipase: 557 U/L — ABNORMAL HIGH (ref 11–51)

## 2023-09-07 MED ORDER — OXYCODONE HCL 5 MG PO TABS
5.0000 mg | ORAL_TABLET | ORAL | 0 refills | Status: AC | PRN
  Filled 2023-09-07: qty 15, 3d supply, fill #0

## 2023-09-07 MED ORDER — ONDANSETRON HCL 4 MG/2ML IJ SOLN
4.0000 mg | Freq: Once | INTRAMUSCULAR | Status: AC
  Administered 2023-09-07: 4 mg via INTRAVENOUS
  Filled 2023-09-07: qty 2

## 2023-09-07 MED ORDER — SODIUM CHLORIDE 0.9 % IV BOLUS
1000.0000 mL | Freq: Once | INTRAVENOUS | Status: AC
  Administered 2023-09-07: 1000 mL via INTRAVENOUS

## 2023-09-07 MED ORDER — FAMOTIDINE IN NACL 20-0.9 MG/50ML-% IV SOLN
20.0000 mg | Freq: Once | INTRAVENOUS | Status: AC
  Administered 2023-09-07: 20 mg via INTRAVENOUS
  Filled 2023-09-07: qty 50

## 2023-09-07 MED ORDER — HYDROMORPHONE HCL 1 MG/ML IJ SOLN
1.0000 mg | Freq: Once | INTRAMUSCULAR | Status: AC
  Administered 2023-09-07: 1 mg via INTRAVENOUS
  Filled 2023-09-07: qty 1

## 2023-09-07 MED ORDER — NAPROXEN 500 MG PO TABS
500.0000 mg | ORAL_TABLET | Freq: Two times a day (BID) | ORAL | 0 refills | Status: DC
  Filled 2023-09-07: qty 30, 15d supply, fill #0

## 2023-09-07 NOTE — ED Triage Notes (Signed)
Pt states thinks pancreas pain, states has Hx of same, was in pain yesterday but pain much worse about 0300.

## 2023-09-07 NOTE — ED Provider Notes (Signed)
[facility] Emergency Department Provider Note MRN:  [id number]  Arrival date & time: 09/07/23     Chief Complaint   Abdominal Pain   History of Present Illness   Max Ayers is a 52 y.o. year-old male with a history of hypertension, pancreatitis presenting to the ED with chief complaint of abdominal pain.  Ate a big Mac a few days ago and had some epigastric discomfort shortly after, has progressed, feels similar to prior pancreatitis.  No fever, nausea but no vomiting.  Review of Systems  A thorough review of systems was obtained and all systems are negative except as noted in the HPI and PMH.   Patient's Health History    Past Medical History:  Diagnosis Date   Acute alcoholic pancreatitis 12/14/2021   Hypertension    Tobacco use 12/12/2020    Past Surgical History:  Procedure Laterality Date   ESOPHAGOGASTRODUODENOSCOPY (EGD) WITH PROPOFOL N/A 05/31/2023   Procedure: ESOPHAGOGASTRODUODENOSCOPY (EGD) WITH PROPOFOL;  Surgeon: Willis Modena, MD;  Location: [facility];  Service: Gastroenterology;  Laterality: N/A;   NO PAST SURGERIES     UPPER ESOPHAGEAL ENDOSCOPIC ULTRASOUND (EUS) Bilateral 05/31/2023   Procedure: UPPER ESOPHAGEAL ENDOSCOPIC ULTRASOUND (EUS);  Surgeon: Willis Modena, MD;  Location: [facility];  Service: Gastroenterology;  Laterality: Bilateral;    Family History  Problem Relation Age of Onset   Diabetes Mother    Hypertension Father    Breast cancer Maternal Grandmother    Colon cancer Neg Hx    Stomach cancer Neg Hx    Liver disease Neg Hx    Pancreatic cancer Neg Hx    Esophageal cancer Neg Hx    Rectal cancer Neg Hx     Social History   Socioeconomic History   Marital status: Married    Spouse name: Not on file   Number of children: Not on file   Years of education: Not on file   Highest education level: Not on file  Occupational History   Not on file  Tobacco Use   Smoking status: Former    Current  packs/day: 1.00    Types: Cigarettes, Cigars   Smokeless tobacco: Never  Vaping Use   Vaping status: Every Day   Substances: Flavoring  Substance and Sexual Activity   Alcohol use: Not Currently    Comment: occ   Drug use: No   Sexual activity: Yes    Birth control/protection: Condom  Other Topics Concern   Not on file  Social History Narrative   Not on file   Social Determinants of Health   Financial Resource Strain: Low Risk  (05/01/2023)   Received from [facility]   Overall Financial Resource Strain (CARDIA)    Difficulty of Paying Living Expenses: Not very hard  Food Insecurity: No Food Insecurity (05/26/2023)   Hunger Vital Sign    Worried About Running Out of Food in the Last Year: Never true    Ran Out of Food in the Last Year: Never true  Transportation Needs: No Transportation Needs (05/26/2023)   PRAPARE - Administrator, Civil Service (Medical): No    Lack of Transportation (Non-Medical): No  Physical Activity: Unknown (05/01/2023)   Received from [facility], [facility]   Exercise Vital Sign    Days of Exercise per Week: 0 days    Minutes of Exercise per Session: Not on file  Stress: Stress Concern Present (05/01/2023)   [facility]  [employer] of Occupational Health - Occupational Stress Questionnaire    Feeling of Stress : Rather much  Social Connections: Socially Integrated (05/01/2023)   Received from [facility], [facility]   Social Network    How would you rate your social network (family, work, friends)?: Good participation with social networks  Intimate Partner Violence: Not At Risk (05/26/2023)   Humiliation, Afraid, Rape, and Kick questionnaire    Fear of Current or Ex-Partner: No    Emotionally Abused: No    Physically Abused: No    Sexually Abused: No     Physical Exam   Vitals:   09/07/23 0600 09/07/23 0630  BP: (!) 151/86 129/65  Pulse: 60 (!) 47  Resp: 14    Temp:    SpO2: 99% 94%    CONSTITUTIONAL: Well-appearing, NAD NEURO/PSYCH:  Alert and oriented x 3, no focal deficits EYES:  eyes equal and reactive ENT/NECK:  no LAD, no JVD CARDIO: Regular rate, well-perfused, normal S1 and S2 PULM:  CTAB no wheezing or rhonchi GI/GU:  non-distended, moderate epigastric tenderness to palpation MSK/SPINE:  No gross deformities, no edema SKIN:  no rash, atraumatic   *Additional and/or pertinent findings included in MDM below  Diagnostic and Interventional Summary    EKG Interpretation Date/Time:    Ventricular Rate:    PR Interval:    QRS Duration:    QT Interval:    QTC Calculation:   R Axis:      Text Interpretation:         Labs Reviewed  COMPREHENSIVE METABOLIC PANEL - Abnormal; Notable for the following components:      Result Value   Glucose, Bld 104 (*)    All other components within normal limits  LIPASE, BLOOD - Abnormal; Notable for the following components:   Lipase 557 (*)    All other components within normal limits  CBC    No orders to display    Medications  HYDROmorphone (DILAUDID) injection 1 mg (1 mg Intravenous Given 09/07/23 0500)  famotidine (PEPCID) IVPB 20 mg premix (0 mg Intravenous Stopped 09/07/23 0531)  ondansetron (ZOFRAN) injection 4 mg (4 mg Intravenous Given 09/07/23 0501)  sodium chloride 0.9 % bolus 1,000 mL (0 mLs Intravenous Stopped 09/07/23 0616)  HYDROmorphone (DILAUDID) injection 1 mg (1 mg Intravenous Given 09/07/23 0559)     Procedures  /  Critical Care Procedures  ED Course and Medical Decision Making  Initial Impression and Ddx Suspect pancreatitis, history of the same.  Moderate epigastric tenderness.  Reassuring vital signs, awaiting labs, providing pain control.  Past medical/surgical history that increases complexity of ED encounter: Pancreatitis  Interpretation of Diagnostics I personally reviewed the laboratory assessment and my interpretation is as follows: No significant  blood count or electrolyte disturbance, lipase elevated    Patient Reassessment and Ultimate Disposition/Management     Patient feeling better, vitals normal, no rebound guarding or rigidity, very little diagnostic uncertainty, no real indication for CT imaging at this time.  Had an uncomplicated pancreatitis during last CT imaging, suspect the same.  Discussed options, admission offered, patient over feels well enough to go home and restrict his diet, will return if symptoms worsen.  Patient management required discussion with the following services or consulting groups:  None  Complexity of Problems Addressed Acute illness or injury that poses threat of life of bodily function  Additional Data Reviewed and Analyzed Further history obtained from: Prior ED visit notes and Prior labs/imaging results  Additional Factors Impacting ED  Encounter Risk Use of parenteral controlled substances and Consideration of hospitalization  Elmer Sow. Pilar Plate, MD [facility] Emergency Medicine [facility] [email or web address]  Final Clinical Impressions(s) / ED Diagnoses     ICD-10-CM   1. Acute pancreatitis, unspecified complication status, unspecified pancreatitis type  K85.90       ED Discharge Orders          Ordered    naproxen (NAPROSYN) 500 MG tablet  2 times daily        09/07/23 0657    oxyCODONE (ROXICODONE) 5 MG immediate release tablet  Every 4 hours PRN        09/07/23 0657             Discharge Instructions Discussed with and Provided to Patient:     Discharge Instructions      You were evaluated in the Emergency Department and after careful evaluation, we did not find any emergent condition requiring admission or further testing in the hospital.  Your exam/testing today is overall reassuring.  Symptoms likely due to pancreatitis.  Restrict your diet as we discussed.  Use the Naprosyn twice daily for pain.  Use the oxycodone for more significant  pain.  Please return to the Emergency Department if you experience any worsening of your condition.   Thank you for allowing [place] to be a part of your care.       Sabas Sous, MD 09/07/23 [phone]

## 2023-09-07 NOTE — Discharge Instructions (Signed)
You were evaluated in the Emergency Department and after careful evaluation, we did not find any emergent condition requiring admission or further testing in the hospital.  Your exam/testing today is overall reassuring.  Symptoms likely due to pancreatitis.  Restrict your diet as we discussed.  Use the Naprosyn twice daily for pain.  Use the oxycodone for more significant pain.  Please return to the Emergency Department if you experience any worsening of your condition.   Thank you for allowing [place] to be a part of your care.

## 2023-12-08 ENCOUNTER — Other Ambulatory Visit (HOSPITAL_COMMUNITY): Payer: Self-pay

## 2024-01-18 ENCOUNTER — Emergency Department (HOSPITAL_BASED_OUTPATIENT_CLINIC_OR_DEPARTMENT_OTHER)
Admission: EM | Admit: 2024-01-18 | Discharge: 2024-01-18 | Disposition: A | Discharge status: Home / Self Care | Payer: Medicaid Other | Attending: Emergency Medicine | Admitting: Emergency Medicine

## 2024-01-18 ENCOUNTER — Other Ambulatory Visit (HOSPITAL_BASED_OUTPATIENT_CLINIC_OR_DEPARTMENT_OTHER): Payer: Self-pay

## 2024-01-18 ENCOUNTER — Encounter (HOSPITAL_BASED_OUTPATIENT_CLINIC_OR_DEPARTMENT_OTHER): Payer: Self-pay

## 2024-01-18 ENCOUNTER — Other Ambulatory Visit: Payer: Self-pay

## 2024-01-18 DIAGNOSIS — Z20822 Contact with and (suspected) exposure to covid-19: Secondary | ICD-10-CM | POA: Diagnosis not present

## 2024-01-18 DIAGNOSIS — F17200 Nicotine dependence, unspecified, uncomplicated: Secondary | ICD-10-CM | POA: Insufficient documentation

## 2024-01-18 DIAGNOSIS — I10 Essential (primary) hypertension: Secondary | ICD-10-CM | POA: Insufficient documentation

## 2024-01-18 DIAGNOSIS — J101 Influenza due to other identified influenza virus with other respiratory manifestations: Secondary | ICD-10-CM | POA: Diagnosis not present

## 2024-01-18 DIAGNOSIS — R519 Headache, unspecified: Secondary | ICD-10-CM | POA: Diagnosis present

## 2024-01-18 LAB — RESP PANEL BY RT-PCR (RSV, FLU A&B, COVID)  RVPGX2
Influenza A by PCR: POSITIVE — AB
Influenza B by PCR: NEGATIVE
Resp Syncytial Virus by PCR: NEGATIVE
SARS Coronavirus 2 by RT PCR: NEGATIVE

## 2024-01-18 MED ORDER — BENZONATATE 100 MG PO CAPS
100.0000 mg | ORAL_CAPSULE | Freq: Three times a day (TID) | ORAL | 0 refills | Status: DC | PRN
  Filled 2024-01-18: qty 21, 7d supply, fill #0

## 2024-01-18 MED ORDER — ONDANSETRON 4 MG PO TBDP
4.0000 mg | ORAL_TABLET | Freq: Three times a day (TID) | ORAL | 0 refills | Status: DC | PRN
  Filled 2024-01-18: qty 20, 7d supply, fill #0

## 2024-01-18 NOTE — Discharge Instructions (Signed)
 You were diagnosed with the flu (influenza).  You will feel ill for as much as a few weeks.  Please take any prescribed medications as instructed, and you may use over-the-counter Tylenol and/or ibuprofen as needed according to label instructions (unless you have an allergy to either or have been told by your doctor not to take them).  Follow up with your physician as instructed above, and return to the Emergency Department (ED) if you are unable to tolerate fluids due to vomiting, have worsening trouble breathing, become extremely tired or difficult to awaken, or if you develop any other symptoms that concern you.

## 2024-01-18 NOTE — ED Provider Notes (Signed)
 Emergency Department Provider Note   I have reviewed the triage vital signs and the nursing notes.   HISTORY  Chief Complaint Generalized Body Aches   HPI Max Ayers is a 53 y.o. male with PMH of HTN presents to the ED for evaluation of flulike symptoms along with nausea.  Patient has had associated headache, body aches, nasal congestion.  Symptoms been going on for the past 4 days.  No chest pain or shortness of breath.  Denies severe abdominal pain.  He has been very nauseated but no vomiting.  No diarrhea.  Past Medical History:  Diagnosis Date   Acute alcoholic pancreatitis 12/14/2021   Hypertension    Tobacco use 12/12/2020    Review of Systems  Constitutional: Positive fever/chills Cardiovascular: Denies chest pain. Respiratory: Denies shortness of breath. Gastrointestinal: No abdominal pain.  Positive nausea, no vomiting.  No diarrhea.  No constipation. Skin: Negative for rash. Neurological: Positive HA.   ____________________________________________   PHYSICAL EXAM:  VITAL SIGNS: ED Triage Vitals  Encounter Vitals Group     BP 01/18/24 1251 136/83     Pulse Rate 01/18/24 1251 86     Resp 01/18/24 1251 18     Temp 01/18/24 1251 98.9 F (37.2 C)     Temp Source 01/18/24 1251 Oral     SpO2 01/18/24 1251 96 %     Weight 01/18/24 1251 190 lb (86.2 kg)     Height 01/18/24 1251 5' 11 (1.803 m)   Constitutional: Alert and oriented. Well appearing and in no acute distress. Eyes: Conjunctivae are normal.  Head: Atraumatic. Nose: No congestion/rhinnorhea. Mouth/Throat: Mucous membranes are moist.   Neck: No stridor.   Cardiovascular: Normal rate, regular rhythm. Good peripheral circulation. Grossly normal heart sounds.   Respiratory: Normal respiratory effort.  No retractions. Lungs CTAB. Gastrointestinal: Soft and nontender. No distention.  Musculoskeletal: No gross deformities of extremities. Neurologic:  Normal speech and language.  Skin:  Skin is  warm, dry and intact. No rash noted.   ____________________________________________   LABS (all labs ordered are listed, but only abnormal results are displayed)  Labs Reviewed  RESP PANEL BY RT-PCR (RSV, FLU A&B, COVID)  [id number] - Abnormal; Notable for the following components:      Result Value   Influenza A by PCR POSITIVE (*)    All other components within normal limits   ____________________________________________   PROCEDURES  Procedure(s) performed:   Procedures  None  ____________________________________________   INITIAL IMPRESSION / ASSESSMENT AND PLAN / ED COURSE  Pertinent labs & imaging results that were available during my care of the patient were reviewed by me and considered in my medical decision making (see chart for details).   This patient is Presenting for Evaluation of flu symptoms, which does require a range of treatment options, and is a complaint that involves a high risk of morbidity and mortality.  The Differential Diagnoses include COVID, Flu, RSV, CAP, etc.   Clinical Laboratory Tests Ordered, included flu a positive.  Medical Decision Making: Summary:  Patient presents emergency department with bodyaches, headache, congestion and nausea.  No tenderness on abdominal exam.  Vital signs are normal.  No SIRS.  Doubt acute intra-abdominal process, specifically doubt pancreatitis.  No vomiting.  Patient testing positive for flu A.  Plan for supportive care.  He is outside the timeframe to benefit from Tamiflu.  Cough medication and nausea medication sent to the pharmacy for pickup.  Discussed tricked ED return precautions.  Patient is tolerating  fluids in the ED.  Patient's presentation is most consistent with acute presentation with potential threat to life or bodily function.   Disposition: discharge  ____________________________________________  FINAL CLINICAL IMPRESSION(S) / ED DIAGNOSES  Final diagnoses:  Influenza A     NEW OUTPATIENT  MEDICATIONS STARTED DURING THIS VISIT:  New Prescriptions   BENZONATATE  (TESSALON ) 100 MG CAPSULE    Take 1 capsule (100 mg total) by mouth 3 (three) times daily as needed for cough.   ONDANSETRON  (ZOFRAN -ODT) 4 MG DISINTEGRATING TABLET    Take 1 tablet (4 mg total) by mouth every 8 (eight) hours as needed.    Note:  This document was prepared using Dragon voice recognition software and may include unintentional dictation errors.  Fonda Law, MD, [facility] Emergency Medicine    Eriel Doyon, Fonda MATSU, MD 01/18/24 [phone]

## 2024-01-18 NOTE — ED Triage Notes (Signed)
 In for eval of body aches, headache, nasal congestion, chills, abd pain, nausea. Denies vomiting. Onset Monday evening.

## 2024-06-12 ENCOUNTER — Inpatient Hospital Stay (HOSPITAL_BASED_OUTPATIENT_CLINIC_OR_DEPARTMENT_OTHER)
Admission: EM | Admit: 2024-06-12 | Discharge: 2024-06-15 | DRG: 440 | Disposition: A | Discharge status: Home / Self Care | Attending: Family Medicine | Admitting: Family Medicine

## 2024-06-12 ENCOUNTER — Emergency Department (HOSPITAL_BASED_OUTPATIENT_CLINIC_OR_DEPARTMENT_OTHER)

## 2024-06-12 ENCOUNTER — Other Ambulatory Visit: Payer: Self-pay

## 2024-06-12 ENCOUNTER — Encounter (HOSPITAL_BASED_OUTPATIENT_CLINIC_OR_DEPARTMENT_OTHER): Payer: Self-pay | Admitting: Emergency Medicine

## 2024-06-12 DIAGNOSIS — K859 Acute pancreatitis without necrosis or infection, unspecified: Secondary | ICD-10-CM | POA: Diagnosis not present

## 2024-06-12 DIAGNOSIS — Z91030 Bee allergy status: Secondary | ICD-10-CM

## 2024-06-12 DIAGNOSIS — I10 Essential (primary) hypertension: Secondary | ICD-10-CM | POA: Diagnosis not present

## 2024-06-12 DIAGNOSIS — Z91041 Radiographic dye allergy status: Secondary | ICD-10-CM

## 2024-06-12 DIAGNOSIS — R748 Abnormal levels of other serum enzymes: Secondary | ICD-10-CM | POA: Diagnosis present

## 2024-06-12 DIAGNOSIS — J9601 Acute respiratory failure with hypoxia: Secondary | ICD-10-CM | POA: Diagnosis present

## 2024-06-12 DIAGNOSIS — E785 Hyperlipidemia, unspecified: Secondary | ICD-10-CM | POA: Diagnosis present

## 2024-06-12 DIAGNOSIS — Z791 Long term (current) use of non-steroidal anti-inflammatories (NSAID): Secondary | ICD-10-CM

## 2024-06-12 DIAGNOSIS — E782 Mixed hyperlipidemia: Secondary | ICD-10-CM

## 2024-06-12 DIAGNOSIS — F1011 Alcohol abuse, in remission: Secondary | ICD-10-CM | POA: Diagnosis present

## 2024-06-12 DIAGNOSIS — Z87891 Personal history of nicotine dependence: Secondary | ICD-10-CM

## 2024-06-12 DIAGNOSIS — K219 Gastro-esophageal reflux disease without esophagitis: Secondary | ICD-10-CM | POA: Diagnosis present

## 2024-06-12 DIAGNOSIS — Z8249 Family history of ischemic heart disease and other diseases of the circulatory system: Secondary | ICD-10-CM

## 2024-06-12 DIAGNOSIS — Z79899 Other long term (current) drug therapy: Secondary | ICD-10-CM

## 2024-06-12 DIAGNOSIS — K861 Other chronic pancreatitis: Secondary | ICD-10-CM | POA: Diagnosis present

## 2024-06-12 LAB — CK: Total CK: 142 U/L (ref 49–397)

## 2024-06-12 LAB — BASIC METABOLIC PANEL WITH GFR
Anion gap: 10 (ref 5–15)
BUN: 11 mg/dL (ref 6–20)
CO2: 28 mmol/L (ref 22–32)
Calcium: 8.8 mg/dL — ABNORMAL LOW (ref 8.9–10.3)
Chloride: 100 mmol/L (ref 98–111)
Creatinine, Ser: 0.93 mg/dL (ref 0.61–1.24)
GFR, Estimated: >60 mL/min (ref >60)
Glucose, Bld: 110 mg/dL — ABNORMAL HIGH (ref 70–99)
Potassium: 3.9 mmol/L (ref 3.5–5.1)
Sodium: 138 mmol/L (ref 135–145)

## 2024-06-12 LAB — URINALYSIS, ROUTINE W REFLEX MICROSCOPIC
Bilirubin Urine: NEGATIVE
Glucose, UA: NEGATIVE mg/dL
Hgb urine dipstick: NEGATIVE
Ketones, ur: NEGATIVE mg/dL
Leukocytes,Ua: NEGATIVE
Nitrite: NEGATIVE
Protein, ur: NEGATIVE mg/dL
Specific Gravity, Urine: 1.015 (ref 1.005–1.030)
pH: 7 (ref 5.0–8.0)

## 2024-06-12 LAB — COMPREHENSIVE METABOLIC PANEL WITH GFR
ALT: 16 U/L (ref 0–44)
AST: 20 U/L (ref 15–41)
Albumin: 3.8 g/dL (ref 3.5–5.0)
Alkaline Phosphatase: 78 U/L (ref 38–126)
Anion gap: 9 (ref 5–15)
BUN: 12 mg/dL (ref 6–20)
CO2: 24 mmol/L (ref 22–32)
Calcium: 8.9 mg/dL (ref 8.9–10.3)
Chloride: 105 mmol/L (ref 98–111)
Creatinine, Ser: 0.92 mg/dL (ref 0.61–1.24)
GFR, Estimated: >60 mL/min (ref >60)
Glucose, Bld: 73 mg/dL (ref 70–99)
Potassium: 3.8 mmol/L (ref 3.5–5.1)
Sodium: 138 mmol/L (ref 135–145)
Total Bilirubin: 0.4 mg/dL (ref 0.0–1.2)
Total Protein: 6.5 g/dL (ref 6.5–8.1)

## 2024-06-12 LAB — CBC
HCT: 42.8 % (ref 39.0–52.0)
Hemoglobin: 14.5 g/dL (ref 13.0–17.0)
MCH: 29.1 pg (ref 26.0–34.0)
MCHC: 33.9 g/dL (ref 30.0–36.0)
MCV: 85.9 fL (ref 80.0–100.0)
Platelets: 234 10*3/uL (ref 150–400)
RBC: 4.98 MIL/uL (ref 4.22–5.81)
RDW: 12.8 % (ref 11.5–15.5)
WBC: 6.2 10*3/uL (ref 4.0–10.5)
nRBC: 0 % (ref 0.0–0.2)

## 2024-06-12 LAB — LIPASE, BLOOD: Lipase: 2387 U/L — ABNORMAL HIGH (ref 11–51)

## 2024-06-12 LAB — PHOSPHORUS: Phosphorus: 3.5 mg/dL (ref 2.5–4.6)

## 2024-06-12 LAB — MAGNESIUM: Magnesium: 1.8 mg/dL (ref 1.7–2.4)

## 2024-06-12 MED ORDER — ONDANSETRON HCL 4 MG/2ML IJ SOLN
4.0000 mg | Freq: Once | INTRAMUSCULAR | Status: AC
  Administered 2024-06-12: 4 mg via INTRAVENOUS
  Filled 2024-06-12: qty 2

## 2024-06-12 MED ORDER — HYDROMORPHONE HCL 1 MG/ML IJ SOLN
0.5000 mg | INTRAMUSCULAR | Status: DC | PRN
  Administered 2024-06-13 (×3): 0.5 mg via INTRAVENOUS
  Filled 2024-06-12 (×3): qty 0.5

## 2024-06-12 MED ORDER — ONDANSETRON HCL 4 MG/2ML IJ SOLN
4.0000 mg | Freq: Four times a day (QID) | INTRAMUSCULAR | Status: DC | PRN
  Administered 2024-06-12 – 2024-06-13 (×3): 4 mg via INTRAVENOUS
  Filled 2024-06-12 (×3): qty 2

## 2024-06-12 MED ORDER — MORPHINE SULFATE (PF) 4 MG/ML IV SOLN
4.0000 mg | Freq: Once | INTRAVENOUS | Status: AC
  Administered 2024-06-12: 4 mg via INTRAVENOUS
  Filled 2024-06-12: qty 1

## 2024-06-12 MED ORDER — HYDROMORPHONE HCL 1 MG/ML IJ SOLN
0.5000 mg | Freq: Once | INTRAMUSCULAR | Status: AC
  Administered 2024-06-12: 0.5 mg via INTRAVENOUS
  Filled 2024-06-12: qty 1

## 2024-06-12 MED ORDER — SODIUM CHLORIDE 0.9 % IV SOLN: INTRAVENOUS | Status: AC

## 2024-06-12 MED ORDER — FENTANYL CITRATE PF 50 MCG/ML IJ SOSY
100.0000 ug | PREFILLED_SYRINGE | INTRAMUSCULAR | Status: DC | PRN
  Administered 2024-06-12: 100 ug via INTRAVENOUS
  Filled 2024-06-12: qty 2

## 2024-06-12 MED ORDER — IOHEXOL 300 MG/ML  SOLN
100.0000 mL | Freq: Once | INTRAMUSCULAR | Status: AC | PRN
  Administered 2024-06-12: 100 mL via INTRAVENOUS

## 2024-06-12 MED ORDER — LACTATED RINGERS IV BOLUS
1000.0000 mL | Freq: Once | INTRAVENOUS | Status: AC
  Administered 2024-06-12: 1000 mL via INTRAVENOUS

## 2024-06-12 MED ORDER — ACETAMINOPHEN 650 MG RE SUPP: 650.0000 mg | Freq: Four times a day (QID) | RECTAL | Status: DC | PRN

## 2024-06-12 MED ORDER — ACETAMINOPHEN 500 MG PO TABS: 1000.0000 mg | ORAL_TABLET | Freq: Four times a day (QID) | ORAL | Status: DC | PRN

## 2024-06-12 MED ORDER — ACETAMINOPHEN 325 MG PO TABS: 650.0000 mg | ORAL_TABLET | Freq: Four times a day (QID) | ORAL | Status: DC | PRN

## 2024-06-12 MED ORDER — PANTOPRAZOLE SODIUM 40 MG IV SOLR
40.0000 mg | Freq: Two times a day (BID) | INTRAVENOUS | Status: DC
  Administered 2024-06-13 – 2024-06-14 (×5): 40 mg via INTRAVENOUS
  Filled 2024-06-12 (×5): qty 10

## 2024-06-12 NOTE — Assessment & Plan Note (Signed)
 No longer on statins

## 2024-06-12 NOTE — ED Notes (Signed)
 Pt. Sats dropped to upper 80s   Placed Pt. On 2.5 liters oxygen n/c to get Pt. At 95% sat.

## 2024-06-12 NOTE — H&P (Signed)
 Max Ayers FMW:[id number] DOB: 04-13-1971 DOA: 06/12/2024     PCP: [facility]   Outpatient Specialists:      GI Dr. Burnette Gwen, )    Patient arrived to ER on 06/12/24 at 1302 Referred by Attending No att. providers found   Patient coming from:    home Lives  With family      Chief Complaint:   Chief Complaint  Patient presents with   Abdominal Pain    HPI: Max Ayers is a 53 y.o. male with medical history significant of pancreatitis, etoh abuse, HTN, HLD    Presented with   abdominal pain  Presents with epigastric pain as well as left upper quadrant pain has been on and off has history of pancreatitis in the past feels similar endorses some nausea Pain first started at night but has gotten severe in the morning Denies chest pain no shortness of breath no blood in stool denies recent alcohol  abuse history of hypertension hyperlipidemia  in the ER found to have lipase 2387 CT abdomen pelvis showing acute pancreatitis pain difficult to control with IV Dilaudid  and IV morphine  patient was rehydrated     Denies significant ETOH intake  reports last Etoh over 1 year Does not smoke   Denies marijuana use      Regarding pertinent Chronic problems:    Hyperlipidemia - not on statins   Lipid Panel     Component Value Date/Time   CHOL 210 (H) 02/13/2023 0353   TRIG 98 05/26/2023 0637   HDL 34 (L) 02/13/2023 0353   CHOLHDL 6.2 02/13/2023 0353   VLDL 23 02/13/2023 0353   LDLCALC 153 (H) 02/13/2023 0353     HTN on norvasc , hydralazine     While in ER: Clinical Course as of 06/12/24 2238  Wed Jun 12, 2024  1929 Pancreatitis with hx of such, symptoms started last night progressed, highest lipase ever is 800, 2400 today, pancreatitis shown on CT, possible renal artery aneurysm, epigastric and LUQ pain, 2 doses of morphine , 1 of dilaudid , fluids  [CH]    Clinical Course User Index [CH] Hinnant, Terrall FALCON, PA-C         Lab Orders         Lipase,  blood         Comprehensive metabolic panel         CBC         Urinalysis, Routine w reflex microscopic -Urine, Clean Catch        CTabd/pelvis - Acute pancreatitis.  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    Following Medications were ordered in ER: Medications  fentaNYL  (SUBLIMAZE ) injection 100 mcg (100 mcg Intravenous Given 06/12/24 2043)  acetaminophen  (TYLENOL ) tablet 1,000 mg (has no administration in time range)  ondansetron  (ZOFRAN ) injection 4 mg (4 mg Intravenous Given 06/12/24 2122)  morphine  (PF) 4 MG/ML injection 4 mg (4 mg Intravenous Given 06/12/24 1517)  lactated ringers  bolus 1,000 mL (0 mLs Intravenous Stopped 06/12/24 1637)  morphine  (PF) 4 MG/ML injection 4 mg (4 mg Intravenous Given 06/12/24 1649)  ondansetron  (ZOFRAN ) injection 4 mg (4 mg Intravenous Given 06/12/24 1648)  iohexol  (OMNIPAQUE ) 300 MG/ML solution 100 mL (100 mLs Intravenous Contrast Given 06/12/24 1707)  HYDROmorphone  (DILAUDID ) injection 0.5 mg (0.5 mg Intravenous Given 06/12/24 1833)  lactated ringers  bolus 1,000 mL (1,000 mLs Intravenous New Bag/Given 06/12/24 2034)    _____    ED Triage Vitals  Encounter Vitals Group     BP  06/12/24 1309 (!) 148/84     Girls Systolic BP Percentile --      Girls Diastolic BP Percentile --      Boys Systolic BP Percentile --      Boys Diastolic BP Percentile --      Pulse Rate 06/12/24 1309 91     Resp 06/12/24 1309 18     Temp 06/12/24 1309 98.2 F (36.8 C)     Temp Source 06/12/24 2212 Oral     SpO2 06/12/24 1309 94 %     Weight 06/12/24 1308 190 lb (86.2 kg)     Height --      Head Circumference --      Peak Flow --      Pain Score 06/12/24 1307 8     Pain Loc --      Pain Education --      Exclude from Growth Chart --   UFJK(75)@     _________________________________________ Significant initial  Findings: Abnormal Labs Reviewed  LIPASE, BLOOD - Abnormal; Notable for the following components:      Result Value   Lipase 2,387 (*)    All other  components within normal limits    The recent clinical data is shown below. Vitals:   06/12/24 1630 06/12/24 1726 06/12/24 1830 06/12/24 2212  BP: (!) 154/84 139/77 (!) 158/96 (!) 162/82  Pulse: 65 65 68 (!) 47  Resp:  16 17 16   Temp:    97.8 F (36.6 C)  TempSrc:    Oral  SpO2: 97% 97% 96% 100%  Weight:        WBC     Component Value Date/Time   WBC 6.2 06/12/2024 1329   LYMPHSABS 1.7 06/03/2023 0507   MONOABS 0.4 06/03/2023 0507   EOSABS 0.1 06/03/2023 0507   BASOSABS 0.0 06/03/2023 0507         UA   no evidence of UTI   Urine analysis:    Component Value Date/Time   COLORURINE YELLOW 06/12/2024 1724   APPEARANCEUR CLEAR 06/12/2024 1724   LABSPEC 1.015 06/12/2024 1724   PHURINE 7.0 06/12/2024 1724   GLUCOSEU NEGATIVE 06/12/2024 1724   HGBUR NEGATIVE 06/12/2024 1724   BILIRUBINUR NEGATIVE 06/12/2024 1724   KETONESUR NEGATIVE 06/12/2024 1724   PROTEINUR NEGATIVE 06/12/2024 1724   UROBILINOGEN 0.2 10/27/2011 0957   NITRITE NEGATIVE 06/12/2024 1724   LEUKOCYTESUR NEGATIVE 06/12/2024 1724   _____________________________________________ Recent Labs  Lab 06/12/24 1329  NA 138  K 3.8  CO2 24  GLUCOSE 73  BUN 12  CREATININE 0.92  CALCIUM 8.9    Cr    stable,    Lab Results  Component Value Date   CREATININE 0.92 06/12/2024   CREATININE 0.93 09/07/2023   CREATININE 1.02 07/13/2023    Recent Labs  Lab 06/12/24 1329  AST 20  ALT 16  ALKPHOS 78  BILITOT 0.4  PROT 6.5  ALBUMIN 3.8   Lab Results  Component Value Date   CALCIUM 8.9 06/12/2024   PHOS 3.0 11/15/2022          Plt: Lab Results  Component Value Date   PLT 234 06/12/2024        Recent Labs  Lab 06/12/24 1329  WBC 6.2  HGB 14.5  HCT 42.8  MCV 85.9  PLT 234    HG/HCT   stable,       Component Value Date/Time   HGB 14.5 06/12/2024 1329   HCT 42.8 06/12/2024 1329   MCV  85.9 06/12/2024 1329      Recent Labs  Lab 06/12/24 1329  LIPASE 2,387*   No results for  input(s): AMMONIA in the last 168 hours.    _______________________________________________ Hospitalist was called for admission for   Acute pancreatitis,      The following Work up has been ordered so far:  Orders Placed This Encounter  Procedures   CT ABDOMEN PELVIS W CONTRAST   Lipase, blood   Comprehensive metabolic panel   CBC   Urinalysis, Routine w reflex microscopic -Urine, Clean Catch   Diet NPO time specified   Cardiac Monitoring Continuous x 24 hours Indications for use: Other; Other indications for use: Close monitoring   Consult to hospitalist   EKG   EKG   Place in observation (patient's expected length of stay will be less than 2 midnights)     OTHER Significant initial  Findings:  labs showing:     DM  labs:  HbA1C: No results for input(s): HGBA1C in the last 8760 hours.     CBG (last 3)  No results for input(s): GLUCAP in the last 72 hours.        Cultures: No results found for: SDES, SPECREQUEST, CULT, REPTSTATUS   Radiological Exams on Admission: CT ABDOMEN PELVIS W CONTRAST Result Date: 06/12/2024 CLINICAL DATA:  Epigastric pain.  History of pancreatitis. EXAM: CT ABDOMEN AND PELVIS WITH CONTRAST TECHNIQUE: Multidetector CT imaging of the abdomen and pelvis was performed using the standard protocol following bolus administration of intravenous contrast. RADIATION DOSE REDUCTION: This exam was performed according to the departmental dose-optimization program which includes automated exposure control, adjustment of the mA and/or kV according to patient size and/or use of iterative reconstruction technique. CONTRAST:  OMNIPAQUE  IOHEXOL  300 MG/ML  SOLN COMPARISON:  05/02/2023. FINDINGS: Lower chest: Tiny juxtapleural nodules are considered benign. Minimal dependent atelectasis bilaterally. Heart is enlarged. No pericardial or pleural effusion. Distal esophagus is grossly unremarkable. Hepatobiliary: Liver and gallbladder are unremarkable.  No biliary ductal dilatation. Pancreas: Peripancreatic inflammatory haziness and stranding. The pancreatic duct is minimally dilated, 4 mm, unchanged from 05/02/2023. Spleen: Negative. Adrenals/Urinary Tract: Adrenal glands and kidneys are unremarkable. There may be a small calcified right renal artery aneurysm, measuring 5 mm (301/32). Ureters are decompressed. Bladder is grossly unremarkable. Stomach/Bowel: Stomach, small bowel, appendix and colon are unremarkable. Vascular/Lymphatic: Atherosclerotic calcification of the aorta. No pathologically enlarged lymph nodes. Reproductive: Prostate is normal in size. Other: Tiny left inguinal hernia contains fat. No free fluid. Mesenteries and peritoneum are unremarkable. Musculoskeletal: None. IMPRESSION: 1. Acute pancreatitis. 2. 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 3.  Aortic atherosclerosis (ICD10-I70.0). Electronically Signed   By: Newell Eke M.D.   On: 06/12/2024 17:38   _______________________________________________________________________________________________________ Latest  Blood pressure (!) 162/82, pulse (!) 47, temperature 97.8 F (36.6 C), temperature source Oral, resp. rate 16, weight 86.2 kg, SpO2 100%.   Vitals  labs and radiology finding personally reviewed  Review of Systems:    Pertinent positives include:   abdominal pain, nausea,  Constitutional:  No weight loss, night sweats, Fevers, chills, fatigue, weight loss  HEENT:  No headaches, Difficulty swallowing,Tooth/dental problems,Sore throat,  No sneezing, itching, ear ache, nasal congestion, post nasal drip,  Cardio-vascular:  No chest pain, Orthopnea, PND, anasarca, dizziness, palpitations.no Bilateral lower extremity swelling  GI:  No heartburn, indigestion, vomiting, diarrhea, change in bowel habits, loss of appetite, melena, blood in stool, hematemesis Resp:  no shortness of breath at rest. No dyspnea on exertion, No excess  mucus, no productive cough, No  non-productive cough, No coughing up of blood.No change in color of mucus.No wheezing. Skin:  no rash or lesions. No jaundice GU:  no dysuria, change in color of urine, no urgency or frequency. No straining to urinate.  No flank pain.  Musculoskeletal:  No joint pain or no joint swelling. No decreased range of motion. No back pain.  Psych:  No change in mood or affect. No depression or anxiety. No memory loss.  Neuro: no localizing neurological complaints, no tingling, no weakness, no double vision, no gait abnormality, no slurred speech, no confusion  All systems reviewed and apart from HOPI all are negative _______________________________________________________________________________________________ Past Medical History:   Past Medical History:  Diagnosis Date   Acute alcoholic pancreatitis 12/14/2021   Hypertension    Tobacco use 12/12/2020      Past Surgical History:  Procedure Laterality Date   ESOPHAGOGASTRODUODENOSCOPY (EGD) WITH PROPOFOL  N/A 05/31/2023   Procedure: ESOPHAGOGASTRODUODENOSCOPY (EGD) WITH PROPOFOL ;  Surgeon: Burnette Fallow, MD;  Location: [facility];  Service: Gastroenterology;  Laterality: N/A;   NO PAST SURGERIES     UPPER ESOPHAGEAL ENDOSCOPIC ULTRASOUND (EUS) Bilateral 05/31/2023   Procedure: UPPER ESOPHAGEAL ENDOSCOPIC ULTRASOUND (EUS);  Surgeon: Burnette Fallow, MD;  Location: [facility];  Service: Gastroenterology;  Laterality: Bilateral;    Social History:  Ambulatory   independently     reports that he has quit smoking. His smoking use included cigarettes and cigars. He has never used smokeless tobacco. He reports that he does not currently use alcohol . He reports that he does not use drugs.     Family History:   Family History  Problem Relation Age of Onset   Diabetes Mother    Hypertension Father    Breast cancer Maternal Grandmother    Colon cancer Neg Hx    Stomach cancer Neg Hx    Liver disease Neg Hx    Pancreatic cancer Neg  Hx    Esophageal cancer Neg Hx    Rectal cancer Neg Hx    ______________________________________________________________________________________________ Allergies: Allergies  Allergen Reactions   Yellow Jacket Venom [Bee Venom] Anaphylaxis and Hives   Gadolinium Derivatives Nausea Only    2nd time in a row. Pt received MRI contrast and immediately experienced nausea.      Prior to Admission medications   Medication Sig Start Date End Date Taking? Authorizing Provider  amLODipine  (NORVASC ) 10 MG tablet Take 1 tablet (10 mg total) by mouth daily. 04/15/23   Lue Fallow BROCKS, MD  benzonatate  (TESSALON ) 100 MG capsule Take 1 capsule (100 mg total) by mouth 3 (three) times daily as needed for cough. 01/18/24   Long, Fonda MATSU, MD  hydrALAZINE  (APRESOLINE ) 100 MG tablet Take 1 tablet (100 mg total) by mouth 3 (three) times daily. Patient taking differently: Take 100 mg by mouth 2 (two) times daily. 04/15/23   Lue Fallow BROCKS, MD  metoCLOPramide  (REGLAN ) 10 MG tablet Take 1 tablet (10 mg total) by mouth every 6 (six) hours as needed for vomiting or nausea. 06/04/23   Molpus, John, MD  naproxen  (NAPROSYN ) 500 MG tablet Take 1 tablet (500 mg total) by mouth 2 (two) times daily. 09/07/23   Theadore Ozell HERO, MD  ondansetron  (ZOFRAN -ODT) 4 MG disintegrating tablet Take 1 tablet (4 mg total) by mouth every 8 (eight) hours as needed. 01/18/24   Long, Fonda MATSU, MD  oxyCODONE  (ROXICODONE ) 5 MG immediate release tablet Take 1 tablet (5 mg total) by mouth every 4 (four) hours as  needed for severe pain. 09/07/23   Theadore Ozell HERO, MD  oxyCODONE -acetaminophen  (PERCOCET) 10-325 MG tablet Take 1 tablet by mouth every 6 (six) hours as needed for pain. 06/05/23   Yolande Lamar BROCKS, MD  pantoprazole  (PROTONIX ) 40 MG tablet Take 1 tablet (40 mg total) by mouth 2 (two) times daily as needed (heartburn). Patient taking differently: Take 40 mg by mouth 2 (two) times daily. 04/15/23 07/14/23  Lue Elsie BROCKS, MD   polyethylene glycol (MIRALAX  / GLYCOLAX ) 17 g packet Take 17 g by mouth 2 (two) times daily. 04/15/23   Lue Elsie BROCKS, MD    ___________________________________________________________________________________________________ Physical Exam:    06/12/2024   10:12 PM 06/12/2024    6:30 PM 06/12/2024    5:26 PM  Vitals with BMI  Systolic 162 158 860  Diastolic 82 96 77  Pulse 47 68 65     1. General:  in No  Acute distress   Chronically ill -appearing 2. Psychological: Alert and   Oriented 3. Head/ENT:    Dry Mucous Membranes                          Head Non traumatic, neck supple                           Poor Dentition 4. SKIN:  decreased Skin turgor,  Skin clean Dry and intact no rash    5. Heart: Regular rate and rhythm no  Murmur, no Rub or gallop 6. Lungs:  no wheezes or crackles   7. Abdomen: Soft, epigastric-tender, Non distended   obese  bowel sounds present 8. Lower extremities: no clubbing, cyanosis, no  edema 9. Neurologically Grossly intact, moving all 4 extremities equally   10. MSK: Normal range of motion    Chart has been reviewed  ______________________________________________________________________________________________  Assessment/Plan 53 y.o. male with medical history significant of pancreatitis, etoh abuse, HTN, HLD  Admitted for   Acute pancreatitis,      Present on Admission:  Acute pancreatitis  Acute on chronic pancreatitis (HCC)  Essential hypertension  History of alcohol  abuse  HLD (hyperlipidemia)  Acute respiratory failure with hypoxia (HCC)     Acute on chronic pancreatitis (HCC)    CT of abdomen showing acute pancreatitis  no evidence of pseudocyst Lipase     Component Value Date/Time   LIPASE 2,387 (H) 06/12/2024 1329   Lipid Panel     Component Value Date/Time   CHOL 210 (H) 02/13/2023 0353   TRIG 98 05/26/2023 0637   HDL 34 (L) 02/13/2023 0353   CHOLHDL 6.2 02/13/2023 0353   VLDL 23 02/13/2023 0353   LDLCALC 153  (H) 02/13/2023 0353   Will repeat in AM  Will rehydrate with aggressive IV fluids Keep n.p.o. Follow clinically   -Check lipid panel   Control pain with IV pain medications If no significant improvement in the next 24 hours may benefit from GI consult     Essential hypertension Once able and resume home medications including Norvasc  10 mg a day and hydralazine  100 mg 3 times daily  History of alcohol  abuse Currently in remission  HLD (hyperlipidemia) No longer on statins  Acute respiratory failure with hypoxia (HCC) Transient after IV pain meds administration Will wean off o2  Obtain CXR   Other plan as per orders.  DVT prophylaxis:  SCD     Code Status:    Code Status: Prior FULL  CODE  as per patient   I had personally discussed CODE STATUS with patient  ACP   none   Family Communication:   Family not at  Bedside    Diet  Diet Orders (From admission, onward)     Start     Ordered   06/12/24 2240  Diet NPO time specified  Diet effective now        06/12/24 2239            Disposition Plan:      To home once workup is complete and patient is stable   Following barriers for discharge:                                                          Pain controlled with PO medications                                                             Will need to be able to tolerate PO                                  Consult Orders  (From admission, onward)           Start     Ordered   06/12/24 1827  Consult to hospitalist  Called care Link for consult talked to [facility] at 6:35  Once       Provider:  (Not yet assigned)  Question Answer Comment  Place call to: [facility]   Reason for Consult Admit      06/12/24 1826                                Consults called:    NONE   Admission status:  ED Disposition     ED Disposition  Admit   Condition  --   Comment  Hospital Area: [facility] [[id number]]  Level of Care:  Telemetry [5]  Admit to tele based on following criteria: Other see comments  Comments: Close monitoring  Interfacility transfer: Yes  May place patient in observation at [facility] or [facility] if equivalent level of care is available:: Yes  Covid Evaluation: Asymptomatic - no recent exposure (last 10 days) testing not required  Diagnosis: Acute pancreatitis [577.0.ICD-9-CM]  Admitting Physician: BOBBETTE HERCULES [[id number]]  Attending Physician: NEYSA CARON PARAS [[id number]]           Obs      Level of care     tele  For 12H     Dayden Viverette 06/12/2024, 11:57 PM    [facility]     after 2 AM please page floor coverage   If 7AM-7PM, please contact the day team taking care of the patient using [email or web address]

## 2024-06-12 NOTE — ED Provider Notes (Addendum)
 Patient signed out to myself by [place] PA-C. At this time waiting on call from hospitalist for admission for pancreatitis.   Patient reassessed by myself, patient still has generalized abdominal tenderness worse in the epigastric region.  On chart review patient has lipase of close at 2400, other labs unremarkable including CBC, CMP, UA.  CT of the abdomen and pelvis consistent with acute pancreatitis and possible small right renal artery aneurysm, however patient hemodynamically stable and vital signs stable in the emergency department today.  Patient given nausea medications, fluids, and pain medications by previous PA before signout. Patient feeling slightly improved but still in significant pain.   Call taken from hospitalist, Dr. Alfornia, patient admitted for acute uncomplicated pancreatitis.   Max Terrall FALCON, PA-C 06/12/24 2015    Max Terrall FALCON, PA-C 06/12/24 2025    Max Caron PARAS, DO 06/12/24 2310

## 2024-06-12 NOTE — Assessment & Plan Note (Signed)
 Currently in remission

## 2024-06-12 NOTE — ED Notes (Signed)
 Pt. Reports he feels like he has pancreatitis again

## 2024-06-12 NOTE — Assessment & Plan Note (Signed)
 Once able and resume home medications including Norvasc  10 mg a day and hydralazine  100 mg 3 times daily

## 2024-06-12 NOTE — Plan of Care (Signed)
 Plan of Care Note for accepted transfer   Patient name: Max Ayers FMW:[id number] DOB: Apr 29, 1971  Facility requesting transfer: [facility] ED Requesting Provider: Neldon Hamp RAMAN, PA  Facility course: 53 year old male with history of alcohol  abuse, recurrent pancreatitis and pseudocyst, hypertension presented to the ED with complaints of epigastric/left upper quadrant abdominal pain and nausea.  Slightly hypertensive but otherwise vital signs stable on arrival.  Labs showing no leukocytosis, normal LFTs, lipase 2387.  CT abdomen pelvis showing: IMPRESSION: 1. Acute pancreatitis. 2. 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 3.  Aortic atherosclerosis (ICD10-I70.0).  Patient was given Dilaudid , morphine ., Zofran , and IV fluids in the ED.  Plan of care: The patient is accepted for admission to Telemetry unit at [facility].  [facility] will assume care on arrival to accepting facility. Until arrival, care as per EDP. However, [facility] available 24/7 for questions and assistance.  Check [email or web address] for on-call coverage.  Nursing staff, please call [facility] Admits & Consults System-Wide number under Amion on patient's arrival so appropriate admitting provider can evaluate the pt.

## 2024-06-12 NOTE — ED Provider Notes (Signed)
 [place] [facility] AT [facility] Provider Note   CSN: [id number] Arrival date & time: 06/12/24  1302     Patient presents with: Abdominal Pain   Max Ayers is a 53 y.o. male.    Abdominal Pain  Patient is a 53 year old male with past medical history significant for pancreatitis hypertension, smoking, sinus bradycardia  Patient was in the emergency room today with complaints of epigastric and left lower quadrant abdominal pain he states been intermittent overnight but significantly worse this morning.  He has had nausea without vomiting.  No chest pain or difficulty breathing.  No blood in his stool.  He denies any recent alcohol  use.     Prior to Admission medications   Medication Sig Start Date End Date Taking? Authorizing Provider  amLODipine  (NORVASC ) 10 MG tablet Take 1 tablet (10 mg total) by mouth daily. 04/15/23   Lue Elsie BROCKS, MD  benzonatate  (TESSALON ) 100 MG capsule Take 1 capsule (100 mg total) by mouth 3 (three) times daily as needed for cough. 01/18/24   Long, Fonda MATSU, MD  hydrALAZINE  (APRESOLINE ) 100 MG tablet Take 1 tablet (100 mg total) by mouth 3 (three) times daily. Patient taking differently: Take 100 mg by mouth 2 (two) times daily. 04/15/23   Lue Elsie BROCKS, MD  metoCLOPramide  (REGLAN ) 10 MG tablet Take 1 tablet (10 mg total) by mouth every 6 (six) hours as needed for vomiting or nausea. 06/04/23   Molpus, John, MD  naproxen  (NAPROSYN ) 500 MG tablet Take 1 tablet (500 mg total) by mouth 2 (two) times daily. 09/07/23   Theadore Ozell HERO, MD  ondansetron  (ZOFRAN -ODT) 4 MG disintegrating tablet Take 1 tablet (4 mg total) by mouth every 8 (eight) hours as needed. 01/18/24   Long, Fonda MATSU, MD  oxyCODONE  (ROXICODONE ) 5 MG immediate release tablet Take 1 tablet (5 mg total) by mouth every 4 (four) hours as needed for severe pain. 09/07/23   Theadore Ozell HERO, MD  oxyCODONE -acetaminophen  (PERCOCET) 10-325 MG tablet Take 1 tablet by mouth  every 6 (six) hours as needed for pain. 06/05/23   Yolande Lamar BROCKS, MD  pantoprazole  (PROTONIX ) 40 MG tablet Take 1 tablet (40 mg total) by mouth 2 (two) times daily as needed (heartburn). Patient taking differently: Take 40 mg by mouth 2 (two) times daily. 04/15/23 07/14/23  Lue Elsie BROCKS, MD  polyethylene glycol (MIRALAX  / GLYCOLAX ) 17 g packet Take 17 g by mouth 2 (two) times daily. 04/15/23   Lue Elsie BROCKS, MD    Allergies: Yellow jacket venom [bee venom] and Gadolinium derivatives    Review of Systems  Gastrointestinal:  Positive for abdominal pain.    Updated Vital Signs BP 139/77   Pulse 65   Temp 98.2 F (36.8 C)   Resp 16   Wt 86.2 kg   SpO2 97%   BMI 26.50 kg/m   Physical Exam Vitals and nursing note reviewed.  Constitutional:      General: He is in acute distress.  HENT:     Head: Normocephalic and atraumatic.     Nose: Nose normal.  Eyes:     General: No scleral icterus. Cardiovascular:     Rate and Rhythm: Normal rate and regular rhythm.     Pulses: Normal pulses.     Heart sounds: Normal heart sounds.  Pulmonary:     Effort: Pulmonary effort is normal. No respiratory distress.     Breath sounds: No wheezing.  Abdominal:     Palpations: Abdomen  is soft.     Tenderness: There is abdominal tenderness in the epigastric area, periumbilical area, left upper quadrant and left lower quadrant.  Musculoskeletal:     Cervical back: Normal range of motion.     Right lower leg: No edema.     Left lower leg: No edema.  Skin:    General: Skin is warm and dry.     Capillary Refill: Capillary refill takes less than 2 seconds.  Neurological:     Mental Status: He is alert. Mental status is at baseline.  Psychiatric:        Mood and Affect: Mood normal.        Behavior: Behavior normal.     (all labs ordered are listed, but only abnormal results are displayed) Labs Reviewed  LIPASE, BLOOD - Abnormal; Notable for the following components:      Result  Value   Lipase 2,387 (*)    All other components within normal limits  COMPREHENSIVE METABOLIC PANEL WITH GFR  CBC  URINALYSIS, ROUTINE W REFLEX MICROSCOPIC    EKG: None  Radiology: CT ABDOMEN PELVIS W CONTRAST Result Date: 06/12/2024 CLINICAL DATA:  Epigastric pain.  History of pancreatitis. EXAM: CT ABDOMEN AND PELVIS WITH CONTRAST TECHNIQUE: Multidetector CT imaging of the abdomen and pelvis was performed using the standard protocol following bolus administration of intravenous contrast. RADIATION DOSE REDUCTION: This exam was performed according to the departmental dose-optimization program which includes automated exposure control, adjustment of the mA and/or kV according to patient size and/or use of iterative reconstruction technique. CONTRAST:  OMNIPAQUE  IOHEXOL  300 MG/ML  SOLN COMPARISON:  05/02/2023. FINDINGS: Lower chest: Tiny juxtapleural nodules are considered benign. Minimal dependent atelectasis bilaterally. Heart is enlarged. No pericardial or pleural effusion. Distal esophagus is grossly unremarkable. Hepatobiliary: Liver and gallbladder are unremarkable. No biliary ductal dilatation. Pancreas: Peripancreatic inflammatory haziness and stranding. The pancreatic duct is minimally dilated, 4 mm, unchanged from 05/02/2023. Spleen: Negative. Adrenals/Urinary Tract: Adrenal glands and kidneys are unremarkable. There may be a small calcified right renal artery aneurysm, measuring 5 mm (301/32). Ureters are decompressed. Bladder is grossly unremarkable. Stomach/Bowel: Stomach, small bowel, appendix and colon are unremarkable. Vascular/Lymphatic: Atherosclerotic calcification of the aorta. No pathologically enlarged lymph nodes. Reproductive: Prostate is normal in size. Other: Tiny left inguinal hernia contains fat. No free fluid. Mesenteries and peritoneum are unremarkable. Musculoskeletal: None. IMPRESSION: 1. Acute pancreatitis. 2. 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 3.  Aortic  atherosclerosis (ICD10-I70.0). Electronically Signed   By: Newell Eke M.D.   On: 06/12/2024 17:38     Procedures   Medications Ordered in the ED  HYDROmorphone  (DILAUDID ) injection 0.5 mg (has no administration in time range)  morphine  (PF) 4 MG/ML injection 4 mg (4 mg Intravenous Given 06/12/24 1517)  lactated ringers  bolus 1,000 mL (0 mLs Intravenous Stopped 06/12/24 1637)  morphine  (PF) 4 MG/ML injection 4 mg (4 mg Intravenous Given 06/12/24 1649)  ondansetron  (ZOFRAN ) injection 4 mg (4 mg Intravenous Given 06/12/24 1648)  iohexol  (OMNIPAQUE ) 300 MG/ML solution 100 mL (100 mLs Intravenous Contrast Given 06/12/24 1707)                                    Medical Decision Making Amount and/or Complexity of Data Reviewed Labs: ordered. Radiology: ordered.  Risk OTC drugs. Prescription drug management. Decision regarding hospitalization.   Patient is a 53 year old male with past medical history significant for pancreatitis  hypertension, smoking, sinus bradycardia  Patient was in the emergency room today with complaints of epigastric and left lower quadrant abdominal pain he states been intermittent overnight but significantly worse this morning.  He has had nausea without vomiting.  No chest pain or difficulty breathing.  No blood in his stool.  He denies any recent alcohol  use.  Patient has significant epigastric tenderness discomfort hypertensive likely secondary to pain.  Lipase elevated at 2387, CBC unremarkable CMP unremarkable CT abdomen pelvis shows evidence of acute pancreatitis. There is also evidence of possible renal artery aneurysm.  This is unlikely to be related to patient symptoms today.  Patient has received 2 doses of morphine  and 1 dose of Dilaudid  and Zofran  1 L of lactated Ringer 's.  Tylenol  fentanyl  and Zofran  as needed orders placed.  Will admit to hospitalist service for continued pain control of the result of his acute pancreatitis.   Final diagnoses:   Acute pancreatitis, unspecified complication status, unspecified pancreatitis type    ED Discharge Orders     None          Neldon Hamp RAMAN, [place] 06/12/24 1928    Bari Roxie HERO, DO 06/13/24 1414

## 2024-06-12 NOTE — ED Triage Notes (Signed)
 Epigastric pain across his abdomen , more to the LUQ pain , on and off he said .  Hx pancreatitis. Nausea no emesis . No diarrhea.

## 2024-06-12 NOTE — Assessment & Plan Note (Signed)
   CT of abdomen showing acute pancreatitis  no evidence of pseudocyst Lipase     Component Value Date/Time   LIPASE 2,387 (H) 06/12/2024 1329   Lipid Panel     Component Value Date/Time   CHOL 210 (H) 02/13/2023 0353   TRIG 98 05/26/2023 0637   HDL 34 (L) 02/13/2023 0353   CHOLHDL 6.2 02/13/2023 0353   VLDL 23 02/13/2023 0353   LDLCALC 153 (H) 02/13/2023 0353   Will repeat in AM  Will rehydrate with aggressive IV fluids Keep n.p.o. Follow clinically   -Check lipid panel   Control pain with IV pain medications If no significant improvement in the next 24 hours may benefit from GI consult

## 2024-06-12 NOTE — Subjective & Objective (Signed)
 Presents with epigastric pain as well as left upper quadrant pain has been on and off has history of pancreatitis in the past feels similar endorses some nausea Pain first started at night but has gotten severe in the morning Denies chest pain no shortness of breath no blood in stool denies recent alcohol  abuse history of hypertension hyperlipidemia  in the ER found to have lipase 2387 CT abdomen pelvis showing acute pancreatitis pain difficult to control with IV Dilaudid  and IV morphine  patient was rehydrated

## 2024-06-12 NOTE — ED Notes (Signed)
 [facility] called for transport.

## 2024-06-12 NOTE — Assessment & Plan Note (Signed)
 Transient after IV pain meds administration Will wean off o2  Obtain CXR

## 2024-06-13 ENCOUNTER — Observation Stay (HOSPITAL_COMMUNITY)

## 2024-06-13 DIAGNOSIS — K861 Other chronic pancreatitis: Secondary | ICD-10-CM | POA: Diagnosis present

## 2024-06-13 DIAGNOSIS — Z79899 Other long term (current) drug therapy: Secondary | ICD-10-CM | POA: Diagnosis not present

## 2024-06-13 DIAGNOSIS — R1013 Epigastric pain: Secondary | ICD-10-CM | POA: Diagnosis present

## 2024-06-13 DIAGNOSIS — K859 Acute pancreatitis without necrosis or infection, unspecified: Secondary | ICD-10-CM | POA: Diagnosis present

## 2024-06-13 DIAGNOSIS — Z87891 Personal history of nicotine dependence: Secondary | ICD-10-CM | POA: Diagnosis not present

## 2024-06-13 DIAGNOSIS — E785 Hyperlipidemia, unspecified: Secondary | ICD-10-CM | POA: Diagnosis present

## 2024-06-13 DIAGNOSIS — Z8249 Family history of ischemic heart disease and other diseases of the circulatory system: Secondary | ICD-10-CM | POA: Diagnosis not present

## 2024-06-13 DIAGNOSIS — I10 Essential (primary) hypertension: Secondary | ICD-10-CM | POA: Diagnosis present

## 2024-06-13 DIAGNOSIS — Z91030 Bee allergy status: Secondary | ICD-10-CM | POA: Diagnosis not present

## 2024-06-13 DIAGNOSIS — F1011 Alcohol abuse, in remission: Secondary | ICD-10-CM | POA: Diagnosis present

## 2024-06-13 DIAGNOSIS — K219 Gastro-esophageal reflux disease without esophagitis: Secondary | ICD-10-CM | POA: Diagnosis present

## 2024-06-13 DIAGNOSIS — R748 Abnormal levels of other serum enzymes: Secondary | ICD-10-CM | POA: Diagnosis present

## 2024-06-13 DIAGNOSIS — Z791 Long term (current) use of non-steroidal anti-inflammatories (NSAID): Secondary | ICD-10-CM | POA: Diagnosis not present

## 2024-06-13 DIAGNOSIS — Z91041 Radiographic dye allergy status: Secondary | ICD-10-CM | POA: Diagnosis not present

## 2024-06-13 LAB — COMPREHENSIVE METABOLIC PANEL WITH GFR
ALT: 19 U/L (ref 0–44)
AST: 18 U/L (ref 15–41)
Albumin: 3.4 g/dL — ABNORMAL LOW (ref 3.5–5.0)
Alkaline Phosphatase: 63 U/L (ref 38–126)
Anion gap: 4 — ABNORMAL LOW (ref 5–15)
BUN: 10 mg/dL (ref 6–20)
CO2: 26 mmol/L (ref 22–32)
Calcium: 8.6 mg/dL — ABNORMAL LOW (ref 8.9–10.3)
Chloride: 108 mmol/L (ref 98–111)
Creatinine, Ser: 1 mg/dL (ref 0.61–1.24)
GFR, Estimated: >60 mL/min (ref >60)
Glucose, Bld: 90 mg/dL (ref 70–99)
Potassium: 4.2 mmol/L (ref 3.5–5.1)
Sodium: 138 mmol/L (ref 135–145)
Total Bilirubin: 0.6 mg/dL (ref 0.0–1.2)
Total Protein: 6.8 g/dL (ref 6.5–8.1)

## 2024-06-13 LAB — CBC
HCT: 42.7 % (ref 39.0–52.0)
Hemoglobin: 13.9 g/dL (ref 13.0–17.0)
MCH: 29.6 pg (ref 26.0–34.0)
MCHC: 32.6 g/dL (ref 30.0–36.0)
MCV: 90.9 fL (ref 80.0–100.0)
Platelets: 205 10*3/uL (ref 150–400)
RBC: 4.7 MIL/uL (ref 4.22–5.81)
RDW: 13 % (ref 11.5–15.5)
WBC: 5.2 10*3/uL (ref 4.0–10.5)
nRBC: 0 % (ref 0.0–0.2)

## 2024-06-13 LAB — LIPID PANEL
Cholesterol: 176 mg/dL (ref 0–200)
HDL: 35 mg/dL — ABNORMAL LOW (ref >40)
LDL Cholesterol: 123 mg/dL — ABNORMAL HIGH (ref 0–99)
Total CHOL/HDL Ratio: 5 ratio
Triglycerides: 92 mg/dL (ref <150)
VLDL: 18 mg/dL (ref 0–40)

## 2024-06-13 LAB — MAGNESIUM: Magnesium: 2.1 mg/dL (ref 1.7–2.4)

## 2024-06-13 LAB — PHOSPHORUS: Phosphorus: 2.6 mg/dL (ref 2.5–4.6)

## 2024-06-13 LAB — HIV ANTIBODY (ROUTINE TESTING W REFLEX): HIV Screen 4th Generation wRfx: NONREACTIVE

## 2024-06-13 LAB — LIPASE, BLOOD: Lipase: 148 U/L — ABNORMAL HIGH (ref 11–51)

## 2024-06-13 MED ORDER — SODIUM CHLORIDE 0.9 % IV SOLN
12.5000 mg | Freq: Once | INTRAVENOUS | Status: AC
  Administered 2024-06-13: 12.5 mg via INTRAVENOUS
  Filled 2024-06-13: qty 12.5
  Filled 2024-06-13: qty 0.5

## 2024-06-13 MED ORDER — HYDRALAZINE HCL 20 MG/ML IJ SOLN: 10.0000 mg | Freq: Four times a day (QID) | INTRAMUSCULAR | Status: DC | PRN

## 2024-06-13 MED ORDER — OXYCODONE HCL 5 MG PO TABS
10.0000 mg | ORAL_TABLET | Freq: Four times a day (QID) | ORAL | Status: DC | PRN
  Administered 2024-06-13 – 2024-06-15 (×5): 10 mg via ORAL
  Filled 2024-06-13 (×5): qty 2

## 2024-06-13 NOTE — Plan of Care (Signed)

## 2024-06-13 NOTE — Progress Notes (Signed)
   06/13/24 0841  TOC Brief Assessment  Insurance and Status Reviewed  Patient has primary care physician Yes  Home environment has been reviewed home w/ spouse  Prior level of function: independent  Prior/Current Home Services No current home services  Social Drivers of Health Review SDOH reviewed no interventions necessary  Readmission risk has been reviewed Yes  Transition of care needs no transition of care needs at this time

## 2024-06-13 NOTE — Plan of Care (Signed)
   Problem: Education: Goal: Knowledge of General Education information will improve Description Including pain rating scale, medication(s)/side effects and non-pharmacologic comfort measures Outcome: Progressing   Problem: Health Behavior/Discharge Planning: Goal: Ability to manage health-related needs will improve Outcome: Progressing

## 2024-06-13 NOTE — Progress Notes (Signed)
 PROGRESS NOTE    Max Ayers  FMW:[id number] DOB: Jan 06, 1971 DOA: 06/12/2024 PCP: [facility]   Brief Narrative:  Max Ayers is a 53 y.o. male with medical history significant of pancreatitis, etoh abuse, HTN, HLD presented with abdominal pain and was found to have elevated lipase of 2387 and CT abdomen showed acute pancreatitis, admitted to hospital service.  Denies consuming alcohol .   Assessment & Plan:   Principal Problem:   Acute on chronic pancreatitis (HCC) Active Problems:   Essential hypertension   History of alcohol  abuse   HLD (hyperlipidemia)   Acute pancreatitis   Acute respiratory failure with hypoxia (HCC)  Acute pancreatitis, POA: Etiology unknown.  Patient denies drinking alcohol .  Not on any medications that can cause either.  Triglyceride normal.  No gallstones on the CT scan.  Pain slightly better.  Still with nausea.  Zofran  is not working well.  1 dose of Phenergan  ordered.  Does not want to take IV pain medications.  Discontinue Dilaudid  and start on oxycodone .  He is willing to take clear liquid diet and advance to full liquid diet later today so those have been ordered as well.  Essential hypertension: Supposed to be taking amlodipine  and hydralazine  but does not take them.  Blood pressure controlled.  Will monitor for now and order as needed IV hydralazine .  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  Hyperlipidemia: He is not on statins anymore.  Transient hypoxia: He encountered transient hypoxia after pain medication in the ED but recovered soon.  Comfortable on room air now.  DVT prophylaxis: SCDs Start: 06/12/24 2244   Code Status: Full Code  Family Communication:  None present at bedside.  Plan of care discussed with patient in length and he/she verbalized understanding and agreed with it.  Status is: Inpatient Remains inpatient appropriate because: Still with pain, advancing diet slowly.   Estimated  body mass index is 26.5 kg/m as calculated from the following:   Height as of 01/18/24: 5' 11 (1.803 m).   Weight as of this encounter: 86.2 kg.    Nutritional Assessment: Body mass index is 26.5 kg/m.SABRA Seen by dietician.  I agree with the assessment and plan as outlined below: Nutrition Status:        . Skin Assessment: I have examined the patient's skin and I agree with the wound assessment as performed by the wound care RN as outlined below:    Consultants:  None  Procedures:  None  Antimicrobials:  Anti-infectives (From admission, onward)    None         Subjective: Seen and examined, abdominal pain improved, 4 out of 10, was 10 out of 10 yesterday.  Still with some nausea but no vomiting.  Objective: Vitals:   06/13/24 0017 06/13/24 0211 06/13/24 0548 06/13/24 0800  BP:  137/83 (!) 152/73 132/82  Pulse:  (!) 54 (!) 54 (!) 58  Resp:  18 18 17   Temp:  (!) 97.5 F (36.4 C) (!) 97.5 F (36.4 C) (!) 97.5 F (36.4 C)  TempSrc:  Oral Oral Oral  SpO2: 96% 97% 97% 99%  Weight:        Intake/Output Summary (Last 24 hours) at 06/13/2024 1222 Last data filed at 06/13/2024 0500 Gross per 24 hour  Intake 1586.2 ml  Output --  Net 1586.2 ml   Filed Weights   06/12/24 1308  Weight: 86.2 kg    Examination:  General exam: Appears calm and comfortable  Respiratory system: Clear to auscultation. Respiratory effort normal. Cardiovascular system: S1 & S2 heard, RRR. No JVD, murmurs, rubs, gallops or clicks. No pedal edema. Gastrointestinal system: Abdomen is nondistended, soft and epigastric and left upper quadrant tenderness. No organomegaly or masses felt. Normal bowel sounds heard. Central nervous system: Alert and oriented. No focal neurological deficits. Extremities: Symmetric 5 x 5 power. Skin: No rashes, lesions or ulcers Psychiatry: Judgement and insight appear normal. Mood & affect appropriate.    Data Reviewed: I have personally reviewed following labs  and imaging studies  CBC: Recent Labs  Lab 06/12/24 1329 06/13/24 0525  WBC 6.2 5.2  HGB 14.5 13.9  HCT 42.8 42.7  MCV 85.9 90.9  PLT 234 205   Basic Metabolic Panel: Recent Labs  Lab 06/12/24 1329 06/12/24 2330 06/13/24 0525  NA 138 138 138  K 3.8 3.9 4.2  CL 105 100 108  CO2 24 28 26   GLUCOSE 73 110* 90  BUN 12 11 10   CREATININE 0.92 0.93 1.00  CALCIUM 8.9 8.8* 8.6*  MG  --  1.8 2.1  PHOS  --  3.5 2.6   GFR: Estimated Creatinine Clearance: 93.1 mL/min (by C-G formula based on SCr of 1 mg/dL). Liver Function Tests: Recent Labs  Lab 06/12/24 1329 06/13/24 0525  AST 20 18  ALT 16 19  ALKPHOS 78 63  BILITOT 0.4 0.6  PROT 6.5 6.8  ALBUMIN 3.8 3.4*   Recent Labs  Lab 06/12/24 1329 06/13/24 0525  LIPASE 2,387* 148*   No results for input(s): AMMONIA in the last 168 hours. Coagulation Profile: No results for input(s): INR, PROTIME in the last 168 hours. Cardiac Enzymes: Recent Labs  Lab 06/12/24 2330  CKTOTAL 142   BNP (last 3 results) No results for input(s): PROBNP in the last 8760 hours. HbA1C: No results for input(s): HGBA1C in the last 72 hours. CBG: No results for input(s): GLUCAP in the last 168 hours. Lipid Profile: Recent Labs    06/13/24 0525  CHOL 176  HDL 35*  LDLCALC 123*  TRIG 92  CHOLHDL 5.0   Thyroid Function Tests: No results for input(s): TSH, T4TOTAL, FREET4, T3FREE, THYROIDAB in the last 72 hours. Anemia Panel: No results for input(s): VITAMINB12, FOLATE, FERRITIN, TIBC, IRON, RETICCTPCT in the last 72 hours. Sepsis Labs: No results for input(s): PROCALCITON, LATICACIDVEN in the last 168 hours.  No results found for this or any previous visit (from the past 240 hours).   Radiology Studies: DG CHEST PORT 1 VIEW Result Date: 06/13/2024 CLINICAL DATA:  53 year old male with hypoxia.  Acute pancreatitis. EXAM: PORTABLE CHEST 1 VIEW COMPARISON:  CT Abdomen and Pelvis yesterday. Chest  radiographs 05/02/2023 and earlier. FINDINGS: Portable AP semi upright view at 0040 hours. Slightly lower lung volumes compared to last year. Normal cardiac size and mediastinal contours. Visualized tracheal air column is within normal limits. Allowing for portable technique the lungs are clear. No pneumothorax or pleural effusion. No acute osseous abnormality identified. Negative visible bowel gas. IMPRESSION: No acute cardiopulmonary abnormality. Electronically Signed   By: VEAR Hurst M.D.   On: 06/13/2024 04:34   CT ABDOMEN PELVIS W CONTRAST Result Date: 06/12/2024 CLINICAL DATA:  Epigastric pain.  History of pancreatitis. EXAM: CT ABDOMEN AND PELVIS WITH CONTRAST TECHNIQUE: Multidetector CT imaging of the abdomen and pelvis was performed using the standard protocol following bolus administration of intravenous contrast. RADIATION DOSE REDUCTION: This exam was performed according to the departmental dose-optimization program which includes automated exposure control, adjustment of  the mA and/or kV according to patient size and/or use of iterative reconstruction technique. CONTRAST:  OMNIPAQUE  IOHEXOL  300 MG/ML  SOLN COMPARISON:  05/02/2023. FINDINGS: Lower chest: Tiny juxtapleural nodules are considered benign. Minimal dependent atelectasis bilaterally. Heart is enlarged. No pericardial or pleural effusion. Distal esophagus is grossly unremarkable. Hepatobiliary: Liver and gallbladder are unremarkable. No biliary ductal dilatation. Pancreas: Peripancreatic inflammatory haziness and stranding. The pancreatic duct is minimally dilated, 4 mm, unchanged from 05/02/2023. Spleen: Negative. Adrenals/Urinary Tract: Adrenal glands and kidneys are unremarkable. There may be a small calcified right renal artery aneurysm, measuring 5 mm (301/32). Ureters are decompressed. Bladder is grossly unremarkable. Stomach/Bowel: Stomach, small bowel, appendix and colon are unremarkable. Vascular/Lymphatic: Atherosclerotic  calcification of the aorta. No pathologically enlarged lymph nodes. Reproductive: Prostate is normal in size. Other: Tiny left inguinal hernia contains fat. No free fluid. Mesenteries and peritoneum are unremarkable. Musculoskeletal: None. IMPRESSION: 1. Acute pancreatitis. 2. 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 3.  Aortic atherosclerosis (ICD10-I70.0). Electronically Signed   By: Newell Eke M.D.   On: 06/12/2024 17:38    Scheduled Meds:  pantoprazole  (PROTONIX ) IV  40 mg Intravenous Q12H   Continuous Infusions:   LOS: 0 days   Fredia Skeeter, MD [facility]  06/13/2024, 12:22 PM   *Please note that this is a verbal dictation therefore any spelling or grammatical errors are due to the Dragon Medical One system interpretation.  Please page via Amion and do not message via secure chat for urgent patient care matters. Secure chat can be used for non urgent patient care matters.  How to contact the [facility] Attending or Consulting provider 7A - 7P or covering provider during after hours 7P -7A, for this patient?  Check the care team in [facility] and look for a) attending/consulting [facility] provider listed and b) the [facility] team listed. Page or secure chat 7A-7P. Log into [email or web address] and use [place]'s universal password to access. If you do not have the password, please contact the hospital operator. Locate the [facility] provider you are looking for under [facility] and page to a number that you can be directly reached. If you still have difficulty reaching the provider, please page the [facility] (Director on Call) for the Hospitalists listed on amion for assistance.

## 2024-06-14 DIAGNOSIS — K859 Acute pancreatitis without necrosis or infection, unspecified: Secondary | ICD-10-CM | POA: Diagnosis not present

## 2024-06-14 LAB — BASIC METABOLIC PANEL WITH GFR
Anion gap: 7 (ref 5–15)
BUN: 6 mg/dL (ref 6–20)
CO2: 27 mmol/L (ref 22–32)
Calcium: 8.9 mg/dL (ref 8.9–10.3)
Chloride: 106 mmol/L (ref 98–111)
Creatinine, Ser: 1.01 mg/dL (ref 0.61–1.24)
GFR, Estimated: >60 mL/min (ref >60)
Glucose, Bld: 90 mg/dL (ref 70–99)
Potassium: 3.9 mmol/L (ref 3.5–5.1)
Sodium: 140 mmol/L (ref 135–145)

## 2024-06-14 MED ORDER — OXYCODONE-ACETAMINOPHEN 5-325 MG PO TABS: 1.0000 | ORAL_TABLET | ORAL | 0 refills | Status: AC | PRN

## 2024-06-14 MED ORDER — OMEPRAZOLE 40 MG PO CPDR: 40.0000 mg | DELAYED_RELEASE_CAPSULE | Freq: Every day | ORAL | 0 refills | Status: DC

## 2024-06-14 NOTE — Discharge Summary (Signed)
 Physician Discharge Summary  Max Ayers FMW:[id number] DOB: 1971-03-18 DOA: 06/12/2024  PCP: [facility]  Admit date: 06/12/2024 Discharge date: 06/14/2024 30 Day Unplanned Readmission Risk Score    Flowsheet Row ED to Hosp-Admission (Current) from 06/12/2024 in [facility]  30 Day Unplanned Readmission Risk Score (%) 9.04 Filed at 06/14/2024 0801    This score is the patient's risk of an unplanned readmission within 30 days of being discharged (0 -100%). The score is based on dignosis, age, lab data, medications, orders, and past utilization.   Low:  0-14.9   Medium: 15-21.9   High: 22-29.9   Extreme: 30 and above          Admitted From: Home Disposition: Home Home  Recommendations for Outpatient Follow-up:  Follow up with PCP in 1-2 weeks Please obtain BMP/CBC in one week Please follow up with your PCP on the following pending results: Unresulted Labs (From admission, onward)    None         Home Health: None Equipment/Devices: None  Discharge Condition: Stable CODE STATUS: Full code Diet recommendation: Soft for 1 to 2 days and then advance to regular as tolerated  Subjective: Seen and examined, he says that his pain is much better, down to 2-3 out of 10.  He has tolerated soft diet.  Although he still has pain and somewhat tenderness as well but he thinks he can go home and is requesting a discharge.  Brief/Interim Summary: Max Ayers is a 53 y.o. male with medical history significant of pancreatitis, etoh abuse, HTN, HLD presented with abdominal pain and was found to have elevated lipase of 2387 and CT abdomen showed acute pancreatitis, admitted to hospital service for acute pancreatitis.  He denies drinking alcohol , he is not on any medications that can cause pancreatitis either. Triglyceride normal.  No gallstones on the CT scan.  He was treated conservatively with IV fluids and pain management as well as NPO.  He  has now tolerated soft diet and he thinks he can go home.  He is requesting some Percocet to keep handy for the pain.  I have prescribed 10 tablets.  He is also requesting Protonix  for GERD which I prescribed.   Essential hypertension: Supposed to be taking amlodipine  and hydralazine  but does not take them.  Blood pressure remained controlled without any antihypertensives.   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   Hyperlipidemia: He is not on statins anymore.   Transient hypoxia: He encountered transient hypoxia after pain medication in the ED but recovered soon.  Comfortable on room air now.  Discharge Diagnoses:  Active Problems:   Essential hypertension   History of alcohol  abuse   HLD (hyperlipidemia)   Acute pancreatitis   Acute respiratory failure with hypoxia [facility])    Discharge Instructions   Allergies as of 06/14/2024       Reactions   Yellow Jacket Venom [bee Venom] Anaphylaxis, Hives   Gadolinium Derivatives Nausea Only   2nd time in a row. Pt received MRI contrast and immediately experienced nausea.         Medication List     STOP taking these medications    amLODipine  10 MG tablet Commonly known as: NORVASC    benzonatate  100 MG capsule Commonly known as: TESSALON    hydrALAZINE  100 MG tablet Commonly known as: APRESOLINE    metoCLOPramide  10 MG tablet Commonly known as: Reglan    naproxen  500 MG tablet Commonly  known as: NAPROSYN    ondansetron  4 MG disintegrating tablet Commonly known as: ZOFRAN -ODT   oxyCODONE -acetaminophen  10-325 MG tablet Commonly known as: Percocet Replaced by: oxyCODONE -acetaminophen  5-325 MG tablet   polyethylene glycol 17 g packet Commonly known as: MIRALAX  / GLYCOLAX        TAKE these medications    acetaminophen  500 MG tablet Commonly known as: TYLENOL  Take 500 mg by mouth every 6 (six) hours as needed for moderate pain (pain score 4-6).   omeprazole  40 MG capsule Commonly known as:  PRILOSEC Take 1 capsule (40 mg total) by mouth daily for 28 days.   oxyCODONE  5 MG immediate release tablet Commonly known as: Roxicodone  Take 1 tablet (5 mg total) by mouth every 4 (four) hours as needed for severe pain.   oxyCODONE -acetaminophen  5-325 MG tablet Commonly known as: Percocet Take 1 tablet by mouth every 4 (four) hours as needed for severe pain (pain score 7-10). Replaces: oxyCODONE -acetaminophen  10-325 MG tablet   pantoprazole  40 MG tablet Commonly known as: PROTONIX  Take 1 tablet (40 mg total) by mouth 2 (two) times daily as needed (heartburn). What changed: when to take this        Follow-up Information     [facility] Follow up in 1 week(s).   Contact information: [address] [place] [address] [phone]                Allergies  Allergen Reactions   Yellow Jacket Venom [Bee Venom] Anaphylaxis and Hives   Gadolinium Derivatives Nausea Only    2nd time in a row. Pt received MRI contrast and immediately experienced nausea.     Consultations: None   Procedures/Studies: DG CHEST PORT 1 VIEW Result Date: 06/13/2024 CLINICAL DATA:  53 year old male with hypoxia.  Acute pancreatitis. EXAM: PORTABLE CHEST 1 VIEW COMPARISON:  CT Abdomen and Pelvis yesterday. Chest radiographs 05/02/2023 and earlier. FINDINGS: Portable AP semi upright view at 0040 hours. Slightly lower lung volumes compared to last year. Normal cardiac size and mediastinal contours. Visualized tracheal air column is within normal limits. Allowing for portable technique the lungs are clear. No pneumothorax or pleural effusion. No acute osseous abnormality identified. Negative visible bowel gas. IMPRESSION: No acute cardiopulmonary abnormality. Electronically Signed   By: VEAR Hurst M.D.   On: 06/13/2024 04:34   CT ABDOMEN PELVIS W CONTRAST Result Date: 06/12/2024 CLINICAL DATA:  Epigastric pain.  History of pancreatitis. EXAM: CT ABDOMEN AND PELVIS WITH CONTRAST  TECHNIQUE: Multidetector CT imaging of the abdomen and pelvis was performed using the standard protocol following bolus administration of intravenous contrast. RADIATION DOSE REDUCTION: This exam was performed according to the departmental dose-optimization program which includes automated exposure control, adjustment of the mA and/or kV according to patient size and/or use of iterative reconstruction technique. CONTRAST:  OMNIPAQUE  IOHEXOL  300 MG/ML  SOLN COMPARISON:  05/02/2023. FINDINGS: Lower chest: Tiny juxtapleural nodules are considered benign. Minimal dependent atelectasis bilaterally. Heart is enlarged. No pericardial or pleural effusion. Distal esophagus is grossly unremarkable. Hepatobiliary: Liver and gallbladder are unremarkable. No biliary ductal dilatation. Pancreas: Peripancreatic inflammatory haziness and stranding. The pancreatic duct is minimally dilated, 4 mm, unchanged from 05/02/2023. Spleen: Negative. Adrenals/Urinary Tract: Adrenal glands and kidneys are unremarkable. There may be a small calcified right renal artery aneurysm, measuring 5 mm (301/32). Ureters are decompressed. Bladder is grossly unremarkable. Stomach/Bowel: Stomach, small bowel, appendix and colon are unremarkable. Vascular/Lymphatic: Atherosclerotic calcification of the aorta. No pathologically enlarged lymph nodes. Reproductive: Prostate is normal in size.  Other: Tiny left inguinal hernia contains fat. No free fluid. Mesenteries and peritoneum are unremarkable. Musculoskeletal: None. IMPRESSION: 1. Acute pancreatitis. 2. 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 3.  Aortic atherosclerosis (ICD10-I70.0). Electronically Signed   By: Newell Eke M.D.   On: 06/12/2024 17:38     Discharge Exam: Vitals:   06/13/24 1928 06/14/24 0416  BP: (!) 147/93 124/80  Pulse: 70 67  Resp: 19 19  Temp: 97.9 F (36.6 C) 97.7 F (36.5 C)  SpO2: 98% 97%   Vitals:   06/13/24 1309 06/13/24 1516 06/13/24 1928 06/14/24  0416  BP:  131/64 (!) 147/93 124/80  Pulse:  (!) 55 70 67  Resp:  19 19 19   Temp:  97.8 F (36.6 C) 97.9 F (36.6 C) 97.7 F (36.5 C)  TempSrc:   Oral Oral  SpO2: 96% 98% 98% 97%  Weight:        General: Pt is alert, awake, not in acute distress Cardiovascular: RRR, S1/S2 +, no rubs, no gallops Respiratory: CTA bilaterally, no wheezing, no rhonchi Abdominal: Soft, mild epigastric and periumbilical tenderness, ND, bowel sounds + Extremities: no edema, no cyanosis    The results of significant diagnostics from this hospitalization (including imaging, microbiology, ancillary and laboratory) are listed below for reference.     Microbiology: No results found for this or any previous visit (from the past 240 hours).   Labs: BNP (last 3 results) No results for input(s): BNP in the last 8760 hours. Basic Metabolic Panel: Recent Labs  Lab 06/12/24 1329 06/12/24 2330 06/13/24 0525 06/14/24 0910  NA 138 138 138 140  K 3.8 3.9 4.2 3.9  CL 105 100 108 106  CO2 24 28 26 27   GLUCOSE 73 110* 90 90  BUN 12 11 10 6   CREATININE 0.92 0.93 1.00 1.01  CALCIUM 8.9 8.8* 8.6* 8.9  MG  --  1.8 2.1  --   PHOS  --  3.5 2.6  --    Liver Function Tests: Recent Labs  Lab 06/12/24 1329 06/13/24 0525  AST 20 18  ALT 16 19  ALKPHOS 78 63  BILITOT 0.4 0.6  PROT 6.5 6.8  ALBUMIN 3.8 3.4*   Recent Labs  Lab 06/12/24 1329 06/13/24 0525  LIPASE 2,387* 148*   No results for input(s): AMMONIA in the last 168 hours. CBC: Recent Labs  Lab 06/12/24 1329 06/13/24 0525  WBC 6.2 5.2  HGB 14.5 13.9  HCT 42.8 42.7  MCV 85.9 90.9  PLT 234 205   Cardiac Enzymes: Recent Labs  Lab 06/12/24 2330  CKTOTAL 142   BNP: Invalid input(s): POCBNP CBG: No results for input(s): GLUCAP in the last 168 hours. D-Dimer No results for input(s): DDIMER in the last 72 hours. Hgb A1c No results for input(s): HGBA1C in the last 72 hours. Lipid Profile Recent Labs    06/13/24 0525   CHOL 176  HDL 35*  LDLCALC 123*  TRIG 92  CHOLHDL 5.0   Thyroid function studies No results for input(s): TSH, T4TOTAL, T3FREE, THYROIDAB in the last 72 hours.  Invalid input(s): FREET3 Anemia work up No results for input(s): VITAMINB12, FOLATE, FERRITIN, TIBC, IRON, RETICCTPCT in the last 72 hours. Urinalysis    Component Value Date/Time   COLORURINE YELLOW 06/12/2024 1724   APPEARANCEUR CLEAR 06/12/2024 1724   LABSPEC 1.015 06/12/2024 1724   PHURINE 7.0 06/12/2024 1724   GLUCOSEU NEGATIVE 06/12/2024 1724   HGBUR NEGATIVE 06/12/2024 1724   BILIRUBINUR NEGATIVE 06/12/2024 1724   KETONESUR  NEGATIVE 06/12/2024 1724   PROTEINUR NEGATIVE 06/12/2024 1724   UROBILINOGEN 0.2 10/27/2011 0957   NITRITE NEGATIVE 06/12/2024 1724   LEUKOCYTESUR NEGATIVE 06/12/2024 1724   Sepsis Labs Recent Labs  Lab 06/12/24 1329 06/13/24 0525  WBC 6.2 5.2   Microbiology No results found for this or any previous visit (from the past 240 hours).  FURTHER DISCHARGE INSTRUCTIONS:   Get Medicines reviewed and adjusted: Please take all your medications with you for your next visit with your Primary MD   Laboratory/radiological data: Please request your Primary MD to go over all hospital tests and procedure/radiological results at the follow up, please ask your Primary MD to get all Hospital records sent to his/her office.   In some cases, they will be blood work, cultures and biopsy results pending at the time of your discharge. Please request that your primary care M.D. goes through all the records of your hospital data and follows up on these results.   Also Note the following: If you experience worsening of your admission symptoms, develop shortness of breath, life threatening emergency, suicidal or homicidal thoughts you must seek medical attention immediately by calling 911 or calling your MD immediately  if symptoms less severe.   You must read complete  instructions/literature along with all the possible adverse reactions/side effects for all the Medicines you take and that have been prescribed to you. Take any new Medicines after you have completely understood and accpet all the possible adverse reactions/side effects.    patient was instructed, not to drive, operate heavy machinery, perform activities at heights, swimming or participation in water activities or provide baby-sitting services while on Pain, Sleep and Anxiety Medications; until their outpatient Physician has advised to do so again. Also recommended to not to take more than prescribed Pain, Sleep and Anxiety Medications.  It is not advisable to combine anxiety, sleep and pain medications without talking with your primary care provider.     Wear Seat belts while driving.   Please note: You were cared for by a hospitalist during your hospital stay. Once you are discharged, your primary care physician will handle any further medical issues. Please note that NO REFILLS for any discharge medications will be authorized once you are discharged, as it is imperative that you return to your primary care physician (or establish a relationship with a primary care physician if you do not have one) for your post hospital discharge needs so that they can reassess your need for medications and monitor your lab values  Time coordinating discharge: Over 30 minutes  SIGNED:   Fredia Skeeter, MD  [facility] 06/14/2024, 11:34 AM *Please note that this is a verbal dictation therefore any spelling or grammatical errors are due to the Dragon [employer] One system interpretation. If 7PM-7AM, please contact night-coverage [email or web address]

## 2024-06-14 NOTE — Plan of Care (Signed)
 I was just informed by the nurse that he decided to stay because he is having pain now.  Will keep him on soft diet.

## 2024-06-14 NOTE — Plan of Care (Signed)
  Problem: Education: Goal: Knowledge of General Education information will improve Description: Including pain rating scale, medication(s)/side effects and non-pharmacologic comfort measures Outcome: Progressing   Problem: Nutrition: Goal: Adequate nutrition will be maintained Outcome: Progressing   Problem: Coping: Goal: Level of anxiety will decrease Outcome: Progressing   Problem: Elimination: Goal: Will not experience complications related to bowel motility Outcome: Progressing   Problem: Pain Managment: Goal: General experience of comfort will improve and/or be controlled Outcome: Progressing   Problem: Safety: Goal: Ability to remain free from injury will improve Outcome: Progressing

## 2024-06-15 DIAGNOSIS — K859 Acute pancreatitis without necrosis or infection, unspecified: Secondary | ICD-10-CM | POA: Diagnosis not present

## 2024-06-15 MED ORDER — OMEPRAZOLE 40 MG PO CPDR
40.0000 mg | DELAYED_RELEASE_CAPSULE | Freq: Every day | ORAL | 0 refills | Status: AC
Start: 2024-06-15 — End: 2024-07-15
  Filled 2024-06-15: qty 28, 28d supply, fill #0

## 2024-06-15 NOTE — Discharge Summary (Signed)
 Physician Discharge Summary  Max Ayers FMW:[id number] DOB: [birth year] DOA: 06/12/2024  PCP: [facility]  Admit date: 06/12/2024 Discharge date: 06/15/2024 30 Day Unplanned Readmission Risk Score    Flowsheet Row ED to Hosp-Admission (Current) from 06/12/2024 in [facility]  30 Day Unplanned Readmission Risk Score (%) 9.04 Filed at 06/14/2024 0801    This score is the patient's risk of an unplanned readmission within 30 days of being discharged (0 -100%). The score is based on dignosis, age, lab data, medications, orders, and past utilization.   Low:  0-14.9   Medium: 15-21.9   High: 22-29.9   Extreme: 30 and above          Admitted From: Home Disposition: Home Home  Recommendations for Outpatient Follow-up:  Follow up with PCP in 1-2 weeks Please obtain BMP/CBC in one week Please follow up with your PCP on the following pending results: Unresulted Labs (From admission, onward)    None         Home Health: None Equipment/Devices: None  Discharge Condition: Stable CODE STATUS: Full code Diet recommendation: Soft for 1 to 2 days and then advance to regular as tolerated  Subjective: Seen and examined, he says that his pain is much better, down to 2-3 out of 10.  He has tolerated soft diet.  Although he still has pain and somewhat tenderness as well but he thinks he can go home and is requesting a discharge.  Brief/Interim Summary: Max Ayers is a 53 y.o. male with medical history significant of pancreatitis, etoh abuse, HTN, HLD presented with abdominal pain and was found to have elevated lipase of 2387 and CT abdomen showed acute pancreatitis, admitted to hospital service for acute pancreatitis.  He denies drinking alcohol , he is not on any medications that can cause pancreatitis either. Triglyceride normal.  No gallstones on the CT scan.  He was treated conservatively with IV fluids and pain management as well as NPO.  He  tolerated soft diet yesterday on 06/14/2024 but still had some abdominal pain and tenderness but he believes that he is doing well and wanted to go home.  Discharge was completed for him but after a while, he started having abdominal pain and he discussed that with his wife and she insisted him to stay so he requested to cancel the discharge.  He was kept overnight.  Was kept on soft diet.  This morning he says that he is feeling well, he has no further abdominal pain or any abdominal tenderness on my exam.  He feels comfortable going home.  Sending home on Protonix  per his request.   Essential hypertension: Supposed to be taking amlodipine  and hydralazine  but does not take them.  Blood pressure remained controlled without any antihypertensives.   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   Hyperlipidemia: He is not on statins anymore.   Transient hypoxia: He encountered transient hypoxia after pain medication in the ED but recovered soon.  Comfortable on room air now.  Discharge Diagnoses:  Active Problems:   Essential hypertension   History of alcohol  abuse   HLD (hyperlipidemia)   Acute pancreatitis   Acute respiratory failure with hypoxia [facility])    Discharge Instructions   Allergies as of 06/15/2024       Reactions   Yellow Jacket Venom [bee Venom] Anaphylaxis, Hives   Gadolinium Derivatives Nausea Only   2nd time in a row. Pt received MRI contrast and  immediately experienced nausea.         Medication List     STOP taking these medications    amLODipine  10 MG tablet Commonly known as: NORVASC    benzonatate  100 MG capsule Commonly known as: TESSALON    hydrALAZINE  100 MG tablet Commonly known as: APRESOLINE    metoCLOPramide  10 MG tablet Commonly known as: Reglan    naproxen  500 MG tablet Commonly known as: NAPROSYN    ondansetron  4 MG disintegrating tablet Commonly known as: ZOFRAN -ODT   oxyCODONE -acetaminophen  10-325 MG tablet Commonly  known as: Percocet Replaced by: oxyCODONE -acetaminophen  5-325 MG tablet   polyethylene glycol 17 g packet Commonly known as: MIRALAX  / GLYCOLAX        TAKE these medications    acetaminophen  500 MG tablet Commonly known as: TYLENOL  Take 500 mg by mouth every 6 (six) hours as needed for moderate pain (pain score 4-6).   omeprazole  40 MG capsule Commonly known as: PRILOSEC Take 1 capsule (40 mg total) by mouth daily for 28 days.   oxyCODONE  5 MG immediate release tablet Commonly known as: Roxicodone  Take 1 tablet (5 mg total) by mouth every 4 (four) hours as needed for severe pain.   oxyCODONE -acetaminophen  5-325 MG tablet Commonly known as: Percocet Take 1 tablet by mouth every 4 (four) hours as needed for severe pain (pain score 7-10). Replaces: oxyCODONE -acetaminophen  10-325 MG tablet   pantoprazole  40 MG tablet Commonly known as: PROTONIX  Take 1 tablet (40 mg total) by mouth 2 (two) times daily as needed (heartburn). What changed: when to take this        Follow-up Information     [facility] Follow up in 1 week(s).   Contact information: [address] [place] [address] [phone]                Allergies  Allergen Reactions   Yellow Jacket Venom [Bee Venom] Anaphylaxis and Hives   Gadolinium Derivatives Nausea Only    2nd time in a row. Pt received MRI contrast and immediately experienced nausea.     Consultations: None   Procedures/Studies: DG CHEST PORT 1 VIEW Result Date: 06/13/2024 CLINICAL DATA:  53 year old male with hypoxia.  Acute pancreatitis. EXAM: PORTABLE CHEST 1 VIEW COMPARISON:  CT Abdomen and Pelvis yesterday. Chest radiographs 05/02/2023 and earlier. FINDINGS: Portable AP semi upright view at 0040 hours. Slightly lower lung volumes compared to last year. Normal cardiac size and mediastinal contours. Visualized tracheal air column is within normal limits. Allowing for portable technique the lungs are clear.  No pneumothorax or pleural effusion. No acute osseous abnormality identified. Negative visible bowel gas. IMPRESSION: No acute cardiopulmonary abnormality. Electronically Signed   By: VEAR Hurst M.D.   On: 06/13/2024 04:34   CT ABDOMEN PELVIS W CONTRAST Result Date: 06/12/2024 CLINICAL DATA:  Epigastric pain.  History of pancreatitis. EXAM: CT ABDOMEN AND PELVIS WITH CONTRAST TECHNIQUE: Multidetector CT imaging of the abdomen and pelvis was performed using the standard protocol following bolus administration of intravenous contrast. RADIATION DOSE REDUCTION: This exam was performed according to the departmental dose-optimization program which includes automated exposure control, adjustment of the mA and/or kV according to patient size and/or use of iterative reconstruction technique. CONTRAST:  OMNIPAQUE  IOHEXOL  300 MG/ML  SOLN COMPARISON:  05/02/2023. FINDINGS: Lower chest: Tiny juxtapleural nodules are considered benign. Minimal dependent atelectasis bilaterally. Heart is enlarged. No pericardial or pleural effusion. Distal esophagus is grossly unremarkable. Hepatobiliary: Liver and gallbladder are unremarkable. No biliary ductal dilatation. Pancreas: Peripancreatic inflammatory haziness and  stranding. The pancreatic duct is minimally dilated, 4 mm, unchanged from 05/02/2023. Spleen: Negative. Adrenals/Urinary Tract: Adrenal glands and kidneys are unremarkable. There may be a small calcified right renal artery aneurysm, measuring 5 mm (301/32). Ureters are decompressed. Bladder is grossly unremarkable. Stomach/Bowel: Stomach, small bowel, appendix and colon are unremarkable. Vascular/Lymphatic: Atherosclerotic calcification of the aorta. No pathologically enlarged lymph nodes. Reproductive: Prostate is normal in size. Other: Tiny left inguinal hernia contains fat. No free fluid. Mesenteries and peritoneum are unremarkable. Musculoskeletal: None. IMPRESSION: 1. Acute pancreatitis. 2. 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 3.  Aortic atherosclerosis (ICD10-I70.0). Electronically Signed   By: Newell Eke M.D.   On: 06/12/2024 17:38     Discharge Exam: Vitals:   06/14/24 1941 06/15/24 0434  BP: (!) 140/90 139/89  Pulse: 83 76  Resp: 20 20  Temp: 98.1 F (36.7 C) 98 F (36.7 C)  SpO2: 97% 93%   Vitals:   06/14/24 0416 06/14/24 1522 06/14/24 1941 06/15/24 0434  BP: 124/80 119/65 (!) 140/90 139/89  Pulse: 67 77 83 76  Resp: 19  20 20   Temp: 97.7 F (36.5 C) 98 F (36.7 C) 98.1 F (36.7 C) 98 F (36.7 C)  TempSrc: Oral  Oral Oral  SpO2: 97% 97% 97% 93%  Weight:        General: Pt is alert, awake, not in acute distress Cardiovascular: RRR, S1/S2 +, no rubs, no gallops Respiratory: CTA bilaterally, no wheezing, no rhonchi Abdominal: Soft, mild epigastric and periumbilical tenderness, ND, bowel sounds + Extremities: no edema, no cyanosis    The results of significant diagnostics from this hospitalization (including imaging, microbiology, ancillary and laboratory) are listed below for reference.     Microbiology: No results found for this or any previous visit (from the past 240 hours).   Labs: BNP (last 3 results) No results for input(s): BNP in the last 8760 hours. Basic Metabolic Panel: Recent Labs  Lab 06/12/24 1329 06/12/24 2330 06/13/24 0525 06/14/24 0910  NA 138 138 138 140  K 3.8 3.9 4.2 3.9  CL 105 100 108 106  CO2 24 28 26 27   GLUCOSE 73 110* 90 90  BUN 12 11 10 6   CREATININE 0.92 0.93 1.00 1.01  CALCIUM 8.9 8.8* 8.6* 8.9  MG  --  1.8 2.1  --   PHOS  --  3.5 2.6  --    Liver Function Tests: Recent Labs  Lab 06/12/24 1329 06/13/24 0525  AST 20 18  ALT 16 19  ALKPHOS 78 63  BILITOT 0.4 0.6  PROT 6.5 6.8  ALBUMIN 3.8 3.4*   Recent Labs  Lab 06/12/24 1329 06/13/24 0525  LIPASE 2,387* 148*   No results for input(s): AMMONIA in the last 168 hours. CBC: Recent Labs  Lab 06/12/24 1329 06/13/24 0525  WBC 6.2 5.2  HGB 14.5 13.9   HCT 42.8 42.7  MCV 85.9 90.9  PLT 234 205   Cardiac Enzymes: Recent Labs  Lab 06/12/24 2330  CKTOTAL 142   BNP: Invalid input(s): POCBNP CBG: No results for input(s): GLUCAP in the last 168 hours. D-Dimer No results for input(s): DDIMER in the last 72 hours. Hgb A1c No results for input(s): HGBA1C in the last 72 hours. Lipid Profile Recent Labs    06/13/24 0525  CHOL 176  HDL 35*  LDLCALC 123*  TRIG 92  CHOLHDL 5.0   Thyroid function studies No results for input(s): TSH, T4TOTAL, T3FREE, THYROIDAB in the last 72 hours.  Invalid input(s):  FREET3 Anemia work up No results for input(s): VITAMINB12, FOLATE, FERRITIN, TIBC, IRON, RETICCTPCT in the last 72 hours. Urinalysis    Component Value Date/Time   COLORURINE YELLOW 06/12/2024 1724   APPEARANCEUR CLEAR 06/12/2024 1724   LABSPEC 1.015 06/12/2024 1724   PHURINE 7.0 06/12/2024 1724   GLUCOSEU NEGATIVE 06/12/2024 1724   HGBUR NEGATIVE 06/12/2024 1724   BILIRUBINUR NEGATIVE 06/12/2024 1724   KETONESUR NEGATIVE 06/12/2024 1724   PROTEINUR NEGATIVE 06/12/2024 1724   UROBILINOGEN 0.2 10/27/2011 0957   NITRITE NEGATIVE 06/12/2024 1724   LEUKOCYTESUR NEGATIVE 06/12/2024 1724   Sepsis Labs Recent Labs  Lab 06/12/24 1329 06/13/24 0525  WBC 6.2 5.2   Microbiology No results found for this or any previous visit (from the past 240 hours).  FURTHER DISCHARGE INSTRUCTIONS:   Get Medicines reviewed and adjusted: Please take all your medications with you for your next visit with your Primary MD   Laboratory/radiological data: Please request your Primary MD to go over all hospital tests and procedure/radiological results at the follow up, please ask your Primary MD to get all Hospital records sent to his/her office.   In some cases, they will be blood work, cultures and biopsy results pending at the time of your discharge. Please request that your primary care M.D. goes through all the  records of your hospital data and follows up on these results.   Also Note the following: If you experience worsening of your admission symptoms, develop shortness of breath, life threatening emergency, suicidal or homicidal thoughts you must seek medical attention immediately by calling 911 or calling your MD immediately  if symptoms less severe.   You must read complete instructions/literature along with all the possible adverse reactions/side effects for all the Medicines you take and that have been prescribed to you. Take any new Medicines after you have completely understood and accpet all the possible adverse reactions/side effects.    patient was instructed, not to drive, operate heavy machinery, perform activities at heights, swimming or participation in water activities or provide baby-sitting services while on Pain, Sleep and Anxiety Medications; until their outpatient Physician has advised to do so again. Also recommended to not to take more than prescribed Pain, Sleep and Anxiety Medications.  It is not advisable to combine anxiety, sleep and pain medications without talking with your primary care provider.     Wear Seat belts while driving.   Please note: You were cared for by a hospitalist during your hospital stay. Once you are discharged, your primary care physician will handle any further medical issues. Please note that NO REFILLS for any discharge medications will be authorized once you are discharged, as it is imperative that you return to your primary care physician (or establish a relationship with a primary care physician if you do not have one) for your post hospital discharge needs so that they can reassess your need for medications and monitor your lab values  Time coordinating discharge: Over 30 minutes  SIGNED:   Fredia Skeeter, MD  [facility] 06/15/2024, 10:27 AM *Please note that this is a verbal dictation therefore any spelling or grammatical errors are due to  the Dragon [employer] One system interpretation. If 7PM-7AM, please contact night-coverage [email or web address]

## 2024-06-15 NOTE — Plan of Care (Signed)
  Problem: Education: Goal: Knowledge of General Education information will improve Description: Including pain rating scale, medication(s)/side effects and non-pharmacologic comfort measures Outcome: Progressing   Problem: Health Behavior/Discharge Planning: Goal: Ability to manage health-related needs will improve Outcome: Progressing   Problem: Clinical Measurements: Goal: Ability to maintain clinical measurements within normal limits will improve Outcome: Progressing Goal: Will remain free from infection Outcome: Progressing Goal: Diagnostic test results will improve Outcome: Progressing   Problem: Activity: Goal: Risk for activity intolerance will decrease Outcome: Progressing   Problem: Nutrition: Goal: Adequate nutrition will be maintained Outcome: Progressing   Problem: Coping: Goal: Level of anxiety will decrease Outcome: Progressing   Problem: Pain Managment: Goal: General experience of comfort will improve and/or be controlled Outcome: Progressing

## 2024-06-17 ENCOUNTER — Other Ambulatory Visit (HOSPITAL_BASED_OUTPATIENT_CLINIC_OR_DEPARTMENT_OTHER): Payer: Self-pay

## 2024-06-27 ENCOUNTER — Other Ambulatory Visit (HOSPITAL_BASED_OUTPATIENT_CLINIC_OR_DEPARTMENT_OTHER): Payer: Self-pay
